# Patient Record
Sex: Female | Born: 1960
Health system: Southern US, Community
[De-identification: ages and names within clinical notes are randomized; demographics above are authoritative.]

## PROBLEM LIST (undated history)

## (undated) DIAGNOSIS — M069 Rheumatoid arthritis, unspecified: Secondary | ICD-10-CM

## (undated) DIAGNOSIS — L0292 Furuncle, unspecified: Secondary | ICD-10-CM

## (undated) DIAGNOSIS — N3946 Mixed incontinence: Secondary | ICD-10-CM

## (undated) DIAGNOSIS — B009 Herpesviral infection, unspecified: Secondary | ICD-10-CM

## (undated) DIAGNOSIS — N3281 Overactive bladder: Secondary | ICD-10-CM

## (undated) DIAGNOSIS — H938X9 Other specified disorders of ear, unspecified ear: Secondary | ICD-10-CM

## (undated) DIAGNOSIS — R7303 Prediabetes: Secondary | ICD-10-CM

## (undated) DIAGNOSIS — K649 Unspecified hemorrhoids: Secondary | ICD-10-CM

## (undated) DIAGNOSIS — E669 Obesity, unspecified: Secondary | ICD-10-CM

## (undated) DIAGNOSIS — G4730 Sleep apnea, unspecified: Secondary | ICD-10-CM

## (undated) DIAGNOSIS — Z9889 Other specified postprocedural states: Secondary | ICD-10-CM

## (undated) DIAGNOSIS — M5440 Lumbago with sciatica, unspecified side: Secondary | ICD-10-CM

## (undated) DIAGNOSIS — K219 Gastro-esophageal reflux disease without esophagitis: Secondary | ICD-10-CM

## (undated) DIAGNOSIS — M179 Osteoarthritis of knee, unspecified: Secondary | ICD-10-CM

## (undated) DIAGNOSIS — R112 Nausea with vomiting, unspecified: Secondary | ICD-10-CM

## (undated) DIAGNOSIS — M1710 Unilateral primary osteoarthritis, unspecified knee: Secondary | ICD-10-CM

## (undated) HISTORY — PX: SLEEVE GASTROPLASTY: SHX1101

## (undated) HISTORY — DX: Overactive bladder: N32.81

## (undated) HISTORY — DX: Rheumatoid arthritis, unspecified: M06.9

## (undated) HISTORY — DX: Herpesviral infection, unspecified: B00.9

## (undated) HISTORY — PX: FRACTURE SURGERY: SHX138

## (undated) HISTORY — DX: Furuncle, unspecified: L02.92

## (undated) HISTORY — DX: Osteoarthritis of knee, unspecified: M17.9

## (undated) HISTORY — DX: Lumbago with sciatica, unspecified side: M54.40

## (undated) HISTORY — DX: Unspecified hemorrhoids: K64.9

## (undated) HISTORY — DX: Mixed incontinence: N39.46

## (undated) HISTORY — DX: Other specified disorders of ear, unspecified ear: H93.8X9

## (undated) HISTORY — DX: Obesity, unspecified: E66.9

## (undated) HISTORY — DX: Unilateral primary osteoarthritis, unspecified knee: M17.10

---

## 2006-02-24 ENCOUNTER — Ambulatory Visit: Payer: Self-pay | Admitting: Surgery

## 2006-03-08 ENCOUNTER — Ambulatory Visit: Payer: Self-pay | Admitting: Gastroenterology

## 2006-03-16 ENCOUNTER — Emergency Department: Payer: Self-pay | Admitting: Emergency Medicine

## 2006-03-17 ENCOUNTER — Other Ambulatory Visit: Payer: Self-pay

## 2006-03-18 ENCOUNTER — Ambulatory Visit: Payer: Self-pay | Admitting: Emergency Medicine

## 2006-03-21 ENCOUNTER — Ambulatory Visit: Payer: Self-pay | Admitting: Emergency Medicine

## 2006-07-20 ENCOUNTER — Ambulatory Visit: Payer: Self-pay | Admitting: Gastroenterology

## 2007-03-13 ENCOUNTER — Emergency Department: Payer: Self-pay | Admitting: Emergency Medicine

## 2008-08-02 HISTORY — PX: TUMOR REMOVAL: SHX12

## 2008-11-15 ENCOUNTER — Ambulatory Visit: Payer: Self-pay | Admitting: Podiatry

## 2009-08-02 HISTORY — PX: CHOLECYSTECTOMY: SHX55

## 2010-08-17 LAB — HM COLONOSCOPY: HM Colonoscopy: NORMAL

## 2010-10-27 ENCOUNTER — Ambulatory Visit: Payer: Self-pay | Admitting: Family Medicine

## 2011-09-14 ENCOUNTER — Emergency Department: Payer: Self-pay | Admitting: Emergency Medicine

## 2011-09-17 ENCOUNTER — Ambulatory Visit: Payer: Self-pay | Admitting: Otolaryngology

## 2011-11-12 ENCOUNTER — Ambulatory Visit: Payer: Self-pay | Admitting: Family Medicine

## 2012-10-13 ENCOUNTER — Ambulatory Visit: Payer: Self-pay

## 2012-12-29 LAB — HM PAP SMEAR

## 2013-01-17 ENCOUNTER — Ambulatory Visit (INDEPENDENT_AMBULATORY_CARE_PROVIDER_SITE_OTHER): Payer: BC Managed Care – PPO | Admitting: General Surgery

## 2013-01-17 ENCOUNTER — Encounter: Payer: Self-pay | Admitting: General Surgery

## 2013-01-17 VITALS — BP 138/72 | HR 78 | Resp 14 | Ht 69.0 in | Wt 297.0 lb

## 2013-01-17 DIAGNOSIS — K625 Hemorrhage of anus and rectum: Secondary | ICD-10-CM

## 2013-01-17 LAB — HEMOCCULT GUIAC POC 1CARD (OFFICE)

## 2013-01-17 MED ORDER — HYDROCORTISONE ACE-PRAMOXINE 2.5-1 % RE CREA: TOPICAL_CREAM | Freq: Two times a day (BID) | RECTAL | Status: DC

## 2013-01-17 NOTE — Patient Instructions (Addendum)
Patient advised to use fiber supplement twice daily. She is also advised to use rectal cream twice daily. Patient to contact our office in 1 month with a report of how she is doing.

## 2013-01-17 NOTE — Progress Notes (Signed)
Patient ID: Erin Contreras, female   DOB: [birth year], 52 y.o.   MRN: [id number]  Chief Complaint  Patient presents with  . Hemorrhoids    HPI Erin Contreras is a 52 y.o. female who presents for an evaluation of hemorrhoids.  The patient states she has been dealing with this for approximately 1 year. She states it has gotten worse. Currently symptoms include nausea, diarrhea, constipation, and rectal bleeding. She states she notices blood in the stool but mostly when wiping.  The patient reports being evaluated at an urgent care center a year ago and provided with medicated suppositories at that time per did not use any particular treatment since then. She reports occasional hard stools. She may move her bowels 3-4 times per day, and occasionally will skip days. She is most likely to see bleeding with hard stools.  The patient reports no connection between her intermittent nausea and her rectal bleeding or pain with defecation. She did not experience any nausea prior to her cholecystectomy in 2007. The procedure was completed by Dr. Clydie Braun. Intra-operative cholangiograms were not performed. The patient showed transient elevation of her bilirubin and liver enzymes, but ultrasound and CT failed to show any evidence of ductal injury. An incidental finding was a less than 3 cm right adrenal mass. HPI  Past Medical History  Diagnosis Date  . Hemorrhoids   . Boils   . Ear mass   . Overactive bladder     Past Surgical History  Procedure Laterality Date  . Cholecystectomy  2011  . 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 No pertinent family history.  Social History History  Substance Use Topics  . Smoking status: Never Smoker   . Smokeless tobacco: Never Used  . Alcohol Use: No    No Known Allergies  Current Outpatient Prescriptions  Medication Sig Dispense Refill  . hydrocortisone-pramoxine (ANALPRAM-HC) 2.5-1 % rectal cream Place rectally 2 (two) times daily.  30 g  2  .  oxybutynin (DITROPAN) 5 MG tablet Take 5 mg by mouth daily.       No current facility-administered medications for this visit.    Review of Systems Review of Systems  Constitutional: Negative.   Respiratory: Negative.   Cardiovascular: Negative.   Gastrointestinal: Positive for nausea, diarrhea, constipation, blood in stool and anal bleeding.    Blood pressure 138/72, pulse 78, resp. rate 14, height 5\' 9"  (1.753 m), weight 297 lb (134.718 kg).  Physical Exam Physical Exam  Constitutional: She appears well-developed and well-nourished.  Cardiovascular: Normal rate, regular rhythm and normal heart sounds.   Pulmonary/Chest: Effort normal and breath sounds normal.  Abdominal: Soft. Normal appearance and bowel sounds are normal. There is no hepatosplenomegaly. No hernia.  Neurological: She is alert.  Skin: Skin is warm and dry.   External anal exam is unremarkable. Digital rectal exam was undertaken without difficulty or discomfort. Stool is Hemoccult negative. Anoscopy was used to visualize the lower rectum and anal rectal junction. No discernible abnormality of the rectal mucosa, internal or external hemorrhoids were identified. No fissures were identified. The patient was noted to have fairly hard stool on digital exam.  Data Reviewed Laboratory studies dated 12/29/2012 showed a normal electrolyte panel, creatinine 0.78, estimated GFR 100. Normal CBC, elevated cholesterol. Normal TSH.  The patient reports he has undergone a colonoscopy under the care of Dr.Iftikhar in the last year or 2. A release was obtained to review this study.  Assessment    Likely  anorectal trauma secondary to hard stools.    Plan    The patient has been asked to make use of a fiber supplement twice a day. Analpram-HC cream has been prescribed to be applied to the anal rectal junction twice a day. She's been asked to have a phone followup in one month with a progress report.  Her primary care provider will  be asked to review the records to see if any followup was done in regards to the right adrenal lesion identified at the time of her 2007 abdominal CT.       Earline Mayotte 01/18/2013, 5:07 PM

## 2013-01-18 ENCOUNTER — Encounter: Payer: Self-pay | Admitting: General Surgery

## 2013-02-06 ENCOUNTER — Ambulatory Visit: Payer: Self-pay | Admitting: Family Medicine

## 2013-02-06 LAB — HM MAMMOGRAPHY: HM Mammogram: NORMAL (ref 0–4)

## 2013-11-26 ENCOUNTER — Ambulatory Visit: Payer: BC Managed Care – PPO | Admitting: General Surgery

## 2013-12-18 ENCOUNTER — Encounter: Payer: Self-pay | Admitting: *Deleted

## 2014-04-25 ENCOUNTER — Ambulatory Visit: Payer: Self-pay | Admitting: Specialist

## 2014-05-09 ENCOUNTER — Ambulatory Visit: Payer: Self-pay | Admitting: Specialist

## 2014-05-10 ENCOUNTER — Ambulatory Visit: Payer: Self-pay | Admitting: Specialist

## 2014-05-10 LAB — CBC WITH DIFFERENTIAL/PLATELET
Basophil #: 0.1 10*3/uL (ref 0.0–0.1)
Basophil %: 1.5 %
Eosinophil #: 0.4 10*3/uL (ref 0.0–0.7)
Eosinophil %: 6.1 %
HCT: 41.1 % (ref 35.0–47.0)
HGB: 13.2 g/dL (ref 12.0–16.0)
Lymphocyte #: 2 10*3/uL (ref 1.0–3.6)
Lymphocyte %: 33.7 %
MCH: 28.1 pg (ref 26.0–34.0)
MCHC: 32.1 g/dL (ref 32.0–36.0)
MCV: 87 fL (ref 80–100)
Monocyte #: 0.3 x10 3/mm (ref 0.2–0.9)
Monocyte %: 5.9 %
Neutrophil #: 3.1 10*3/uL (ref 1.4–6.5)
Neutrophil %: 52.8 %
Platelet: 232 10*3/uL (ref 150–440)
RBC: 4.7 10*6/uL (ref 3.80–5.20)
RDW: 13 % (ref 11.5–14.5)
WBC: 5.9 10*3/uL (ref 3.6–11.0)

## 2014-05-10 LAB — COMPREHENSIVE METABOLIC PANEL
ALK PHOS: 84 U/L
ANION GAP: 7 (ref 7–16)
AST: 25 U/L (ref 15–37)
Albumin: 3.3 g/dL — ABNORMAL LOW (ref 3.4–5.0)
BILIRUBIN TOTAL: 0.4 mg/dL (ref 0.2–1.0)
BUN: 16 mg/dL (ref 7–18)
CALCIUM: 8.5 mg/dL (ref 8.5–10.1)
CHLORIDE: 106 mmol/L (ref 98–107)
CREATININE: 0.62 mg/dL (ref 0.60–1.30)
Co2: 28 mmol/L (ref 21–32)
EGFR (African American): >60
Glucose: 91 mg/dL (ref 65–99)
OSMOLALITY: 282 (ref 275–301)
POTASSIUM: 4 mmol/L (ref 3.5–5.1)
SGPT (ALT): 29 U/L
SODIUM: 141 mmol/L (ref 136–145)
TOTAL PROTEIN: 7.5 g/dL (ref 6.4–8.2)

## 2014-05-10 LAB — IRON AND TIBC
Iron Bind.Cap.(Total): 377 ug/dL (ref 250–450)
Iron Saturation: 28 %
Iron: 107 ug/dL (ref 50–170)
Unbound Iron-Bind.Cap.: 270 ug/dL

## 2014-05-10 LAB — PROTIME-INR
INR: 0.9
PROTHROMBIN TIME: 12.3 s (ref 11.5–14.7)

## 2014-05-10 LAB — FOLATE: Folic Acid: 28.3 ng/mL (ref 3.1–100.0)

## 2014-05-10 LAB — LIPASE, BLOOD: Lipase: 106 U/L (ref 73–393)

## 2014-05-10 LAB — AMYLASE: AMYLASE: 40 U/L (ref 25–115)

## 2014-05-10 LAB — APTT: Activated PTT: 24.3 secs (ref 23.6–35.9)

## 2014-05-10 LAB — MAGNESIUM: Magnesium: 1.9 mg/dL

## 2014-05-10 LAB — FERRITIN: Ferritin (ARMC): 117 ng/mL (ref 8–388)

## 2014-05-10 LAB — HEMOGLOBIN A1C: HEMOGLOBIN A1C: 6 % (ref 4.2–6.3)

## 2014-05-10 LAB — TSH: THYROID STIMULATING HORM: 0.756 u[IU]/mL

## 2014-05-10 LAB — PHOSPHORUS: PHOSPHORUS: 2.8 mg/dL (ref 2.5–4.9)

## 2014-05-10 LAB — BILIRUBIN, DIRECT

## 2014-05-24 DIAGNOSIS — K2100 Gastro-esophageal reflux disease with esophagitis, without bleeding: Secondary | ICD-10-CM | POA: Insufficient documentation

## 2014-06-04 ENCOUNTER — Ambulatory Visit: Payer: Self-pay | Admitting: Specialist

## 2014-06-10 ENCOUNTER — Ambulatory Visit: Payer: Self-pay | Admitting: Gastroenterology

## 2014-07-01 ENCOUNTER — Ambulatory Visit: Payer: Self-pay | Admitting: Specialist

## 2014-08-05 ENCOUNTER — Ambulatory Visit: Payer: Self-pay | Admitting: Specialist

## 2014-08-26 ENCOUNTER — Ambulatory Visit: Payer: Self-pay | Admitting: Specialist

## 2014-08-26 LAB — BASIC METABOLIC PANEL
Anion Gap: 5 — ABNORMAL LOW (ref 7–16)
BUN: 15 mg/dL (ref 7–18)
CALCIUM: 9.5 mg/dL (ref 8.5–10.1)
CO2: 28 mmol/L (ref 21–32)
Chloride: 106 mmol/L (ref 98–107)
Creatinine: 0.76 mg/dL (ref 0.60–1.30)
EGFR (African American): >60
Glucose: 94 mg/dL (ref 65–99)
OSMOLALITY: 278 (ref 275–301)
POTASSIUM: 4.1 mmol/L (ref 3.5–5.1)
Sodium: 139 mmol/L (ref 136–145)

## 2014-09-03 ENCOUNTER — Inpatient Hospital Stay: Payer: Self-pay | Admitting: Specialist

## 2014-09-04 LAB — BASIC METABOLIC PANEL
Anion Gap: 6 — ABNORMAL LOW (ref 7–16)
BUN: 7 mg/dL (ref 7–18)
Calcium, Total: 8.8 mg/dL (ref 8.5–10.1)
Chloride: 105 mmol/L (ref 98–107)
Co2: 27 mmol/L (ref 21–32)
Creatinine: 0.69 mg/dL (ref 0.60–1.30)
EGFR (African American): >60
EGFR (Non-African Amer.): >60
Glucose: 109 mg/dL — ABNORMAL HIGH (ref 65–99)
Osmolality: 274 (ref 275–301)
Potassium: 3.9 mmol/L (ref 3.5–5.1)
Sodium: 138 mmol/L (ref 136–145)

## 2014-09-04 LAB — CBC WITH DIFFERENTIAL/PLATELET
BASOS ABS: 0 10*3/uL (ref 0.0–0.1)
Basophil %: 0 %
EOS ABS: 0 10*3/uL (ref 0.0–0.7)
Eosinophil %: 0 %
HCT: 37.8 % (ref 35.0–47.0)
HGB: 12.3 g/dL (ref 12.0–16.0)
LYMPHS ABS: 0.6 10*3/uL — AB (ref 1.0–3.6)
Lymphocyte %: 7.4 %
MCH: 27.9 pg (ref 26.0–34.0)
MCHC: 32.5 g/dL (ref 32.0–36.0)
MCV: 86 fL (ref 80–100)
MONO ABS: 0.3 x10 3/mm (ref 0.2–0.9)
Monocyte %: 3.2 %
NEUTROS PCT: 89.4 %
Neutrophil #: 7.2 10*3/uL — ABNORMAL HIGH (ref 1.4–6.5)
PLATELETS: 201 10*3/uL (ref 150–440)
RBC: 4.39 10*6/uL (ref 3.80–5.20)
RDW: 13.4 % (ref 11.5–14.5)
WBC: 8 10*3/uL (ref 3.6–11.0)

## 2014-09-04 LAB — MAGNESIUM: Magnesium: 2 mg/dL

## 2014-09-04 LAB — PHOSPHORUS: Phosphorus: 3.7 mg/dL (ref 2.5–4.9)

## 2014-09-04 LAB — ALBUMIN: Albumin: 3 g/dL — ABNORMAL LOW (ref 3.4–5.0)

## 2014-09-27 ENCOUNTER — Ambulatory Visit: Payer: Self-pay

## 2014-11-25 LAB — SURGICAL PATHOLOGY

## 2014-12-01 NOTE — Op Note (Signed)
PATIENT NAME:  Erin Contreras, Erin Contreras MR#:  [id number] DATE OF BIRTH:  12-Mar-1961  DATE OF PROCEDURE:  09/03/2014  PREOPERATIVE DIAGNOSES: Morbid obesity.   POSTOPERATIVE DIAGNOSIS: Morbid obesity plus hiatal hernia.  PROCEDURE: Laparoscopic sleeve gastrectomy plus hiatal hernia repair.   SURGEON: Kreg Shropshire, MD   ASSISTANT: Valaria Good, PA-C   CLINICAL INDICATIONS: See H and P.   SPECIMENS: Portion of stomach.   BLOOD LOSS: None.  COMPLICATIONS: None.   DETAILS OF PROCEDURE: The patient was taken to the operating room and placed on the operating table in the supine position. Appropriate monitors and supplemental oxygen delivered and broad-spectrum antibiotics were administered. The patient was placed under general anesthesia without incident. Timeout was performed. Sequential stockings were placed. The abdomen was prepped and draped in the usual sterile fashion. Timeout was performed. The abdomen was then accessed using a 5 mm optical trocar in the left upper quadrant. Pneumoperitoneum was established. Multiple other trocars were placed in the normal positions in preparation for sleeve gastrectomy. Liver retractor was placed. A small hiatal hernia was present. This was dissected free from the surrounding tissues and the posterior crura were reapproximated using interrupted Surgidac suture. The greater curvature of the stomach was mobilized from several centimeters from the pylorus all the way to the fundus. The fundus was fully mobilized medially with posterior pancreatic attachments taken down. The short gastrics obviously were divided as well, all using Harmonic scalpel. Once this was fully freed up, a 36 French bougie perforated type was inserted down to the antrum and the antrum was bisected using an Echelon green load reinforced stapler starting approximately 5 cm from the pylorus. Multiple other gold loads with Seamguard were then fired using the opposite trocar on the patient's left side  creating a nice sleeve. The excess stomach was removed through a 50 mm port and sent for pathologic evaluation. The staple line was intact and showed no evidence of bleeding or leak. The trocars were removed without incident as well as the liver retractor. The wound was closed using 4-0 Vicryl and Dermabond. The patient tolerated the procedure well and arrived to covered stable condition. The patient will be admitted for further care.    ____________________________ Kreg Shropshire, MD jb:TT D: 09/03/2014 16:14:32 ET T: 09/03/2014 20:12:33 ET JOB#: [id number]  cc: Kreg Shropshire, MD, <Dictator> Bonner Puna MD ELECTRONICALLY SIGNED 09/04/2014 10:27

## 2015-09-22 ENCOUNTER — Other Ambulatory Visit: Payer: Self-pay | Admitting: Unknown Physician Specialty

## 2015-12-02 ENCOUNTER — Encounter: Payer: Self-pay | Admitting: Unknown Physician Specialty

## 2015-12-02 ENCOUNTER — Ambulatory Visit (INDEPENDENT_AMBULATORY_CARE_PROVIDER_SITE_OTHER): Payer: BLUE CROSS/BLUE SHIELD | Admitting: Unknown Physician Specialty

## 2015-12-02 VITALS — BP 114/76 | HR 61 | Temp 98.0°F | Ht 67.5 in | Wt 229.4 lb

## 2015-12-02 DIAGNOSIS — M069 Rheumatoid arthritis, unspecified: Secondary | ICD-10-CM | POA: Insufficient documentation

## 2015-12-02 DIAGNOSIS — L84 Corns and callosities: Secondary | ICD-10-CM | POA: Diagnosis not present

## 2015-12-02 DIAGNOSIS — G8929 Other chronic pain: Secondary | ICD-10-CM | POA: Insufficient documentation

## 2015-12-02 DIAGNOSIS — M545 Low back pain, unspecified: Secondary | ICD-10-CM | POA: Insufficient documentation

## 2015-12-02 DIAGNOSIS — N3946 Mixed incontinence: Secondary | ICD-10-CM | POA: Insufficient documentation

## 2015-12-02 DIAGNOSIS — E669 Obesity, unspecified: Secondary | ICD-10-CM | POA: Insufficient documentation

## 2015-12-02 DIAGNOSIS — M25561 Pain in right knee: Secondary | ICD-10-CM | POA: Diagnosis not present

## 2015-12-02 DIAGNOSIS — M5440 Lumbago with sciatica, unspecified side: Secondary | ICD-10-CM

## 2015-12-02 DIAGNOSIS — M1710 Unilateral primary osteoarthritis, unspecified knee: Secondary | ICD-10-CM | POA: Insufficient documentation

## 2015-12-02 DIAGNOSIS — Z9884 Bariatric surgery status: Secondary | ICD-10-CM | POA: Diagnosis not present

## 2015-12-02 DIAGNOSIS — G4733 Obstructive sleep apnea (adult) (pediatric): Secondary | ICD-10-CM | POA: Diagnosis not present

## 2015-12-02 DIAGNOSIS — M179 Osteoarthritis of knee, unspecified: Secondary | ICD-10-CM | POA: Insufficient documentation

## 2015-12-02 DIAGNOSIS — M0579 Rheumatoid arthritis with rheumatoid factor of multiple sites without organ or systems involvement: Secondary | ICD-10-CM | POA: Insufficient documentation

## 2015-12-02 DIAGNOSIS — K912 Postsurgical malabsorption, not elsewhere classified: Secondary | ICD-10-CM | POA: Diagnosis not present

## 2015-12-02 DIAGNOSIS — I10 Essential (primary) hypertension: Secondary | ICD-10-CM | POA: Diagnosis not present

## 2015-12-02 MED ORDER — MELOXICAM 15 MG PO TABS: 15.0000 mg | ORAL_TABLET | Freq: Every day | ORAL | Status: DC

## 2015-12-02 NOTE — Assessment & Plan Note (Addendum)
Suspect strain with tenderness of right collateral ligament with some OA.  Get new shoes.  Rx for Meloxicam.  Consider x-ray

## 2015-12-02 NOTE — Assessment & Plan Note (Signed)
Keep pared with [employer]

## 2015-12-02 NOTE — Progress Notes (Signed)
BP 114/76 mmHg  Pulse 61  Temp(Src) 98 F (36.7 C)  Ht 5' 7.5" (1.715 m)  Wt 229 lb 6.4 oz (104.055 kg)  BMI 35.38 kg/m2  SpO2 96%   Subjective:    Patient ID: Erin Contreras, female    DOB: Sep 22, 1960, 55 y.o.   MRN: [id number]  HPI: Erin Contreras is a 55 y.o. female  Chief Complaint  Patient presents with  . Knee Pain    pt states she has been having pain in her right knee for a few months now  . Foot Pain    pt states she has also been having pain in right foot when she is on it   Knee pain For 2 months.  Hurts worse at the end of the day after working.  When not working, it feels OK.  Difficult to get it comfortable.  Aleve only helps slightly  Foot pain One particular spot at the ball of her foot hurts for about a month.  States it is a spot about the sized of a quarter.  States pain shoots. This also doesn't hurt when not working.    Relevant past medical, surgical, family and social history reviewed and updated as indicated. Interim medical history since our last visit reviewed. Allergies and medications reviewed and updated.  Review of Systems  Constitutional: Negative.   HENT: Negative.   Eyes: Negative.   Respiratory: Negative.   Cardiovascular: Negative.   Gastrointestinal: Negative.   Endocrine: Negative.   Genitourinary: Negative.   Skin: Negative.   Allergic/Immunologic: Negative.   Neurological: Negative.   Hematological: Negative.   Psychiatric/Behavioral: Negative.     Per HPI unless specifically indicated above     Objective:    BP 114/76 mmHg  Pulse 61  Temp(Src) 98 F (36.7 C)  Ht 5' 7.5" (1.715 m)  Wt 229 lb 6.4 oz (104.055 kg)  BMI 35.38 kg/m2  SpO2 96%  Wt Readings from Last 3 Encounters:  12/02/15 229 lb 6.4 oz (104.055 kg)  10/04/14 279 lb (126.554 kg)  01/17/13 297 lb (134.718 kg)    Physical Exam  Constitutional: She is oriented to person, place, and time. She appears well-developed and well-nourished. No distress.   HENT:  Head: Normocephalic and atraumatic.  Eyes: Conjunctivae and lids are normal. Right eye exhibits no discharge. Left eye exhibits no discharge. No scleral icterus.  Cardiovascular: Normal rate.   Pulmonary/Chest: Effort normal.  Abdominal: Normal appearance. There is no splenomegaly or hepatomegaly.  Musculoskeletal: Normal range of motion.       Right knee: She exhibits normal range of motion, no swelling and no effusion. Tenderness found. LCL tenderness noted.  Neurological: She is alert and oriented to person, place, and time.  Skin: Skin is intact. No rash noted. No pallor.  Callous bottom of right foot which I pared.  Pt reports much relief after this procedure.    Psychiatric: She has a normal mood and affect. Her behavior is normal. Judgment and thought content normal.       Assessment & Plan:   Problem List Items Addressed This Visit      Unprioritized   Knee pain, right - Primary    Suspect strain with tenderness of right collateral ligament with some OA.  Get new shoes.  Rx for Meloxicam.  Consider x-ray      Pre-ulcerative corn or callous    Keep pared with [employer]           Follow  up plan: Return in about 3 weeks (around 12/23/2015).

## 2015-12-11 DIAGNOSIS — G4733 Obstructive sleep apnea (adult) (pediatric): Secondary | ICD-10-CM | POA: Diagnosis not present

## 2015-12-23 ENCOUNTER — Encounter: Payer: Self-pay | Admitting: Unknown Physician Specialty

## 2015-12-23 ENCOUNTER — Ambulatory Visit (INDEPENDENT_AMBULATORY_CARE_PROVIDER_SITE_OTHER): Payer: BLUE CROSS/BLUE SHIELD | Admitting: Unknown Physician Specialty

## 2015-12-23 VITALS — BP 109/75 | HR 63 | Temp 97.7°F | Ht 67.7 in | Wt 233.2 lb

## 2015-12-23 DIAGNOSIS — G5701 Lesion of sciatic nerve, right lower limb: Secondary | ICD-10-CM | POA: Diagnosis not present

## 2015-12-23 DIAGNOSIS — M25561 Pain in right knee: Secondary | ICD-10-CM | POA: Diagnosis not present

## 2015-12-23 MED ORDER — MELOXICAM 15 MG PO TABS: 15.0000 mg | ORAL_TABLET | Freq: Every day | ORAL | Status: DC

## 2015-12-23 MED ORDER — TRAMADOL HCL 50 MG PO TABS: 50.0000 mg | ORAL_TABLET | Freq: Every evening | ORAL | Status: DC | PRN

## 2015-12-23 NOTE — Progress Notes (Signed)
   BP 109/75 mmHg  Pulse 63  Temp(Src) 97.7 F (36.5 C)  Ht 5' 7.7" (1.72 m)  Wt 233 lb 3.2 oz (105.779 kg)  BMI 35.76 kg/m2  SpO2 99%   Subjective:    Patient ID: Erin Contreras, female    DOB: [birth year], 55 y.o.   MRN: [id number]  HPI: Erin Contreras is a 55 y.o. female  Chief Complaint  Patient presents with  . Follow-up    3 week f/u for knee pain   "My knee still hurts me."  We gave her Meloxicam with some relief but taking it BID.  She is having a lot of problems at work.  States her whole right side hurts with pain that starts in her buttock and radiates down "like a toothache."  She often has to hold onto something when she gets up.  She is working 12 hour shifts on her feet and concrete floors.    Relevant past medical, surgical, family and social history reviewed and updated as indicated. Interim medical history since our last visit reviewed. Allergies and medications reviewed and updated.  Review of Systems  Per HPI unless specifically indicated above     Objective:    BP 109/75 mmHg  Pulse 63  Temp(Src) 97.7 F (36.5 C)  Ht 5' 7.7" (1.72 m)  Wt 233 lb 3.2 oz (105.779 kg)  BMI 35.76 kg/m2  SpO2 99%  Wt Readings from Last 3 Encounters:  12/23/15 233 lb 3.2 oz (105.779 kg)  12/02/15 229 lb 6.4 oz (104.055 kg)  10/04/14 279 lb (126.554 kg)    Physical Exam  Constitutional: She is oriented to person, place, and time. She appears well-developed and well-nourished. No distress.  HENT:  Head: Normocephalic and atraumatic.  Eyes: Conjunctivae and lids are normal. Right eye exhibits no discharge. Left eye exhibits no discharge. No scleral icterus.  Cardiovascular: Normal rate.   Pulmonary/Chest: Effort normal.  Abdominal: Normal appearance. There is no splenomegaly or hepatomegaly.  Musculoskeletal: Normal range of motion.       Right knee: She exhibits normal range of motion, no swelling, no erythema, normal alignment, no LCL laxity, normal meniscus and no  MCL laxity. Tenderness found. Lateral joint line and LCL tenderness noted. No MCL and no patellar tendon tenderness noted.  Crepitis noted right knee  Tender left priformis  Neurological: She is alert and oriented to person, place, and time.  Skin: Skin is intact. No rash noted. No pallor.  Psychiatric: She has a normal mood and affect. Her behavior is normal. Judgment and thought content normal.       Assessment & Plan:   Problem List Items Addressed This Visit      Unprioritized   Knee pain, right    Refer to Orthopedics for further management.  Will not get x-ray as they will do x-ray there      Relevant Orders   Ambulatory referral to Orthopedic Surgery   Piriformis syndrome of right side - Primary    Exercises demonstrated  Cautioned to take meloxicam only once a day.  Will add Tramadol for evening use      Relevant Orders   Ambulatory referral to Orthopedic Surgery       Follow up plan: Return if symptoms worsen or fail to improve.

## 2015-12-23 NOTE — Assessment & Plan Note (Addendum)
Exercises demonstrated  Cautioned to take meloxicam only once a day.  Will add Tramadol for evening use

## 2015-12-23 NOTE — Assessment & Plan Note (Signed)
Refer to Orthopedics for further management.  Will not get x-ray as they will do x-ray there

## 2016-01-01 ENCOUNTER — Telehealth: Payer: Self-pay

## 2016-01-01 NOTE — Telephone Encounter (Signed)
Called [facility] and spoke with [name] regarding patient's referral.   Patient cannot be scheduled at this time because patient has a large outstanding balance.  Patient needs to call [name] at [phone] before patient can be scheduled.   Tried calling patient to notify her, she did not answer. Left message for her to return my phone call.

## 2016-01-23 DIAGNOSIS — M71331 Other bursal cyst, right wrist: Secondary | ICD-10-CM | POA: Diagnosis not present

## 2016-01-23 DIAGNOSIS — M79642 Pain in left hand: Secondary | ICD-10-CM | POA: Diagnosis not present

## 2016-01-23 DIAGNOSIS — M79641 Pain in right hand: Secondary | ICD-10-CM | POA: Diagnosis not present

## 2016-01-23 DIAGNOSIS — M0589 Other rheumatoid arthritis with rheumatoid factor of multiple sites: Secondary | ICD-10-CM | POA: Diagnosis not present

## 2016-02-22 DIAGNOSIS — H1013 Acute atopic conjunctivitis, bilateral: Secondary | ICD-10-CM | POA: Diagnosis not present

## 2016-03-02 DIAGNOSIS — H1045 Other chronic allergic conjunctivitis: Secondary | ICD-10-CM | POA: Diagnosis not present

## 2016-03-15 DIAGNOSIS — G4733 Obstructive sleep apnea (adult) (pediatric): Secondary | ICD-10-CM | POA: Diagnosis not present

## 2016-05-03 ENCOUNTER — Ambulatory Visit (INDEPENDENT_AMBULATORY_CARE_PROVIDER_SITE_OTHER): Payer: BLUE CROSS/BLUE SHIELD | Admitting: Family Medicine

## 2016-05-03 ENCOUNTER — Encounter: Payer: Self-pay | Admitting: Family Medicine

## 2016-05-03 VITALS — BP 122/85 | HR 60 | Temp 98.5°F | Wt 234.0 lb

## 2016-05-03 DIAGNOSIS — S39012A Strain of muscle, fascia and tendon of lower back, initial encounter: Secondary | ICD-10-CM | POA: Diagnosis not present

## 2016-05-03 DIAGNOSIS — H04123 Dry eye syndrome of bilateral lacrimal glands: Secondary | ICD-10-CM

## 2016-05-03 DIAGNOSIS — R350 Frequency of micturition: Secondary | ICD-10-CM

## 2016-05-03 LAB — UA/M W/RFLX CULTURE, ROUTINE
BILIRUBIN UA: NEGATIVE
Glucose, UA: NEGATIVE
Ketones, UA: NEGATIVE
Leukocytes, UA: NEGATIVE
NITRITE UA: NEGATIVE
PH UA: 8 — AB (ref 5.0–7.5)
PROTEIN UA: NEGATIVE
RBC, UA: NEGATIVE
Specific Gravity, UA: 1.02 (ref 1.005–1.030)
UUROB: 0.2 mg/dL (ref 0.2–1.0)

## 2016-05-03 MED ORDER — NAPHAZOLINE-PHENIRAMINE 0.025-0.3 % OP SOLN: 1.0000 [drp] | Freq: Four times a day (QID) | OPHTHALMIC | 0 refills | Status: DC | PRN

## 2016-05-03 MED ORDER — CYCLOBENZAPRINE HCL 10 MG PO TABS
10.0000 mg | ORAL_TABLET | Freq: Three times a day (TID) | ORAL | 0 refills | Status: DC | PRN
Start: 2016-05-03 — End: 2016-09-13

## 2016-05-03 MED ORDER — NAPROXEN 500 MG PO TABS: 500.0000 mg | ORAL_TABLET | Freq: Two times a day (BID) | ORAL | 0 refills | Status: DC

## 2016-05-03 NOTE — Patient Instructions (Signed)
Follow up as needed

## 2016-05-03 NOTE — Progress Notes (Signed)
BP 122/85   Pulse 60   Temp 98.5 F (36.9 C)   Wt 234 lb (106.1 kg)   SpO2 100%   BMI 35.90 kg/m    Subjective:    Patient ID: Erin Contreras, female    DOB: [birth year], 55 y.o.   MRN: [id number]  HPI: Erin Contreras is a 55 y.o. female  Chief Complaint  Patient presents with  . Eye Problem    x 6-8 weeks, bilateral eyes, but left is worse. Some blurry vision, feels like they are glued together in the morning, sometimes burns. No itching, redness, or drainage.   Patient presents with 6 week history of b/l eye crusting and intermittent fuzzy vision in the mornings. States when she first wakes up, her eyes feel glued shut and she has to pry her eyelids open. Reports that it takes a few minutes for her vision to clear up and to see clearly, then it seems fine the rest of the day. Has not had any itching or redness. Denies HAs, N/V. Has not tried anything OTC at this time.   Also c/o left lower back pain that is localized and has been present for about 4 days. Not aware of any events that could have caused the pain. Does c/o of some urinary frequency starting around the same time. Has not tried taking anything for the pain.   Relevant past medical, surgical, family and social history reviewed and updated as indicated. Interim medical history since our last visit reviewed. Allergies and medications reviewed and updated.  Review of Systems  Constitutional: Negative.   HENT: Negative.   Eyes: Positive for discharge.  Respiratory: Negative.   Cardiovascular: Negative.   Gastrointestinal: Negative.   Genitourinary: Positive for frequency.  Musculoskeletal: Positive for back pain.  Skin: Negative.   Neurological: Negative.   Psychiatric/Behavioral: Negative.     Per HPI unless specifically indicated above     Objective:    BP 122/85   Pulse 60   Temp 98.5 F (36.9 C)   Wt 234 lb (106.1 kg)   SpO2 100%   BMI 35.90 kg/m   Wt Readings from Last 3 Encounters:  05/03/16  234 lb (106.1 kg)  12/23/15 233 lb 3.2 oz (105.8 kg)  12/02/15 229 lb 6.4 oz (104.1 kg)    Physical Exam  Constitutional: She is oriented to person, place, and time. She appears well-developed and well-nourished. No distress.  HENT:  Head: Atraumatic.  Right Ear: External ear normal.  Left Ear: External ear normal.  Nose: Nose normal.  Mouth/Throat: Oropharynx is clear and moist. No oropharyngeal exudate.  Eyes: Conjunctivae and EOM are normal. Pupils are equal, round, and reactive to light. Right eye exhibits no discharge. Left eye exhibits no discharge. No scleral icterus.  Neck: Normal range of motion. Neck supple.  Cardiovascular: Normal rate, regular rhythm and normal heart sounds.   Pulmonary/Chest: Effort normal and breath sounds normal. No respiratory distress.  Musculoskeletal: Normal range of motion. She exhibits tenderness (TTP in left lumbar paraspinal muscles).  Lymphadenopathy:    She has no cervical adenopathy.  Neurological: She is alert and oriented to person, place, and time. No cranial nerve deficit.  Skin: Skin is warm and dry.  Psychiatric: She has a normal mood and affect. Her behavior is normal.  Nursing note and vitals reviewed.       Assessment & Plan:   Problem List Items Addressed This Visit    None    Visit Diagnoses  Dry eyes, bilateral    -  Primary   Suspect could be allergic component, allergy drops and visine recommended. Will follow up with ophthalmology if no improvement in a week or so.    Urinary frequency       Negative U/A, continue good hydration habits and follow up if dysuria, hematuria, or worsening frequency/urgency   Relevant Orders   UA/M w/rflx Culture, Routine (Completed)   Lumbar strain, initial encounter       Flexeril and naproxen sent, precautions discussed. Gentle stretches, massage, warm epsom salt soaks recommended       Follow up plan: Return if symptoms worsen or fail to improve.

## 2016-06-08 DIAGNOSIS — K912 Postsurgical malabsorption, not elsewhere classified: Secondary | ICD-10-CM | POA: Diagnosis not present

## 2016-06-08 DIAGNOSIS — Z9884 Bariatric surgery status: Secondary | ICD-10-CM | POA: Diagnosis not present

## 2016-06-10 DIAGNOSIS — Z713 Dietary counseling and surveillance: Secondary | ICD-10-CM | POA: Diagnosis not present

## 2016-09-13 ENCOUNTER — Encounter: Payer: Self-pay | Admitting: Unknown Physician Specialty

## 2016-09-13 ENCOUNTER — Ambulatory Visit (INDEPENDENT_AMBULATORY_CARE_PROVIDER_SITE_OTHER): Payer: BLUE CROSS/BLUE SHIELD | Admitting: Unknown Physician Specialty

## 2016-09-13 DIAGNOSIS — M1711 Unilateral primary osteoarthritis, right knee: Secondary | ICD-10-CM | POA: Diagnosis not present

## 2016-09-13 NOTE — Progress Notes (Signed)
   BP 121/75 (BP Location: Left Arm, Patient Position: Sitting, Cuff Size: Large)   Pulse 76   Temp 98.2 F (36.8 C)   Wt 246 lb (111.6 kg)   SpO2 98%   BMI 37.74 kg/m    Subjective:    Patient ID: Erin Contreras, female    DOB: 1961/02/13, 56 y.o.   MRN: [id number]  HPI: JAZZY HOLDERBAUM is a 56 y.o. female  Chief Complaint  Patient presents with  . Pain    pt states she has had pain in her right leg and left ankle off and on for a few months. Patient describes pain as shooting pains   Pt with complaints of primarily right knee pain.  Improves after walking for a period of time.  Hurts after work after being up on it all day "one I begin to relax."  On weekends only hurts when getting up.    Relevant past medical, surgical, family and social history reviewed and updated as indicated. Interim medical history since our last visit reviewed. Allergies and medications reviewed and updated.  Review of Systems  Per HPI unless specifically indicated above     Objective:    BP 121/75 (BP Location: Left Arm, Patient Position: Sitting, Cuff Size: Large)   Pulse 76   Temp 98.2 F (36.8 C)   Wt 246 lb (111.6 kg)   SpO2 98%   BMI 37.74 kg/m   Wt Readings from Last 3 Encounters:  09/13/16 246 lb (111.6 kg)  05/03/16 234 lb (106.1 kg)  12/23/15 233 lb 3.2 oz (105.8 kg)    Physical Exam  Constitutional: She is oriented to person, place, and time. She appears well-developed and well-nourished. No distress.  HENT:  Head: Normocephalic and atraumatic.  Eyes: Conjunctivae and lids are normal. Right eye exhibits no discharge. Left eye exhibits no discharge. No scleral icterus.  Cardiovascular: Normal rate.   Pulmonary/Chest: Effort normal.  Abdominal: Normal appearance. There is no splenomegaly or hepatomegaly.  Musculoskeletal:       Right knee: She exhibits decreased range of motion. She exhibits no swelling and no effusion.  Neurological: She is alert and oriented to person,  place, and time.  Skin: Skin is intact. No rash noted. No pallor.  Psychiatric: She has a normal mood and affect. Her behavior is normal. Judgment and thought content normal.   STEROID INJECTION  Procedure: Knee Intraarticular Steroid Injection   Description: After verbal consent and patient ed on Area prepped and draped using  semi-sterile technique. Using a anterior  approach, a mixture of 4 cc of  1% Marcaine & 1 cc of Kenalog 40 was injected into knee joint.  A bandage was then placed over the injection site. Complications:  none Post Procedure Instructions: To the ER if any symptoms of erythema or swelling.         Assessment & Plan:   Problem List Items Addressed This Visit      Unprioritized   OA (osteoarthritis) of knee    Pt with increasing problems right knee      Relevant Orders   Ambulatory referral to Orthopedic Surgery       Follow up plan: Return if symptoms worsen or fail to improve.

## 2016-09-13 NOTE — Assessment & Plan Note (Signed)
Pt with increasing problems right knee

## 2016-09-15 DIAGNOSIS — M791 Myalgia: Secondary | ICD-10-CM | POA: Diagnosis not present

## 2016-09-15 DIAGNOSIS — M71331 Other bursal cyst, right wrist: Secondary | ICD-10-CM | POA: Diagnosis not present

## 2016-09-15 DIAGNOSIS — M0589 Other rheumatoid arthritis with rheumatoid factor of multiple sites: Secondary | ICD-10-CM | POA: Diagnosis not present

## 2016-09-15 DIAGNOSIS — M79642 Pain in left hand: Secondary | ICD-10-CM | POA: Diagnosis not present

## 2016-09-15 DIAGNOSIS — M79641 Pain in right hand: Secondary | ICD-10-CM | POA: Diagnosis not present

## 2016-10-04 ENCOUNTER — Other Ambulatory Visit: Payer: Self-pay | Admitting: Unknown Physician Specialty

## 2016-12-02 DIAGNOSIS — Z1231 Encounter for screening mammogram for malignant neoplasm of breast: Secondary | ICD-10-CM | POA: Diagnosis not present

## 2017-01-13 ENCOUNTER — Other Ambulatory Visit: Payer: Self-pay | Admitting: Unknown Physician Specialty

## 2017-01-13 NOTE — Telephone Encounter (Signed)
Patient needs a refill on her acylovir (Zovirax) taken as 1 twice a day as needed.  [facility]  Thanks

## 2017-01-13 NOTE — Telephone Encounter (Signed)
Routing to provider  

## 2017-01-14 MED ORDER — ACYCLOVIR 400 MG PO TABS: 400.0000 mg | ORAL_TABLET | Freq: Two times a day (BID) | ORAL | 1 refills | Status: DC | PRN

## 2017-03-13 DIAGNOSIS — Y998 Other external cause status: Secondary | ICD-10-CM | POA: Insufficient documentation

## 2017-03-13 DIAGNOSIS — W010XXA Fall on same level from slipping, tripping and stumbling without subsequent striking against object, initial encounter: Secondary | ICD-10-CM | POA: Insufficient documentation

## 2017-03-13 DIAGNOSIS — Y939 Activity, unspecified: Secondary | ICD-10-CM | POA: Diagnosis not present

## 2017-03-13 DIAGNOSIS — Z79899 Other long term (current) drug therapy: Secondary | ICD-10-CM | POA: Diagnosis not present

## 2017-03-13 DIAGNOSIS — S93401A Sprain of unspecified ligament of right ankle, initial encounter: Secondary | ICD-10-CM | POA: Insufficient documentation

## 2017-03-13 DIAGNOSIS — M25571 Pain in right ankle and joints of right foot: Secondary | ICD-10-CM | POA: Diagnosis not present

## 2017-03-13 DIAGNOSIS — M25561 Pain in right knee: Secondary | ICD-10-CM | POA: Diagnosis not present

## 2017-03-13 DIAGNOSIS — Y929 Unspecified place or not applicable: Secondary | ICD-10-CM | POA: Insufficient documentation

## 2017-03-13 NOTE — ED Triage Notes (Signed)
Patient trying to get from the car to the house tonight and presents with right knee and right ankle pain. Swelling noted to both areas without deformity.

## 2017-03-14 ENCOUNTER — Emergency Department
Admission: EM | Admit: 2017-03-14 | Discharge: 2017-03-14 | Disposition: A | Discharge status: Home / Self Care | Payer: BLUE CROSS/BLUE SHIELD | Attending: Emergency Medicine | Admitting: Emergency Medicine

## 2017-03-14 ENCOUNTER — Emergency Department: Payer: BLUE CROSS/BLUE SHIELD

## 2017-03-14 DIAGNOSIS — M25561 Pain in right knee: Secondary | ICD-10-CM

## 2017-03-14 DIAGNOSIS — M25571 Pain in right ankle and joints of right foot: Secondary | ICD-10-CM | POA: Diagnosis not present

## 2017-03-14 DIAGNOSIS — W19XXXA Unspecified fall, initial encounter: Secondary | ICD-10-CM

## 2017-03-14 DIAGNOSIS — S93401A Sprain of unspecified ligament of right ankle, initial encounter: Secondary | ICD-10-CM

## 2017-03-14 MED ORDER — HYDROCODONE-ACETAMINOPHEN 5-325 MG PO TABS
1.0000 | ORAL_TABLET | Freq: Once | ORAL | Status: AC
  Administered 2017-03-14: 1 via ORAL
  Filled 2017-03-14: qty 1

## 2017-03-14 MED ORDER — IBUPROFEN 800 MG PO TABS
800.0000 mg | ORAL_TABLET | Freq: Once | ORAL | Status: AC
Start: 2017-03-14 — End: 2017-03-14
  Administered 2017-03-14: 800 mg via ORAL
  Filled 2017-03-14: qty 1

## 2017-03-14 MED ORDER — IBUPROFEN 800 MG PO TABS: 800.0000 mg | ORAL_TABLET | Freq: Three times a day (TID) | ORAL | 0 refills | Status: DC | PRN

## 2017-03-14 MED ORDER — HYDROCODONE-ACETAMINOPHEN 5-325 MG PO TABS: 1.0000 | ORAL_TABLET | Freq: Four times a day (QID) | ORAL | 0 refills | Status: DC | PRN

## 2017-03-14 NOTE — ED Notes (Signed)
Pt states she slipped this pm getting of the care. Pt complains of pain from right knee and right ankle. Pt with lateral right ankle swelling noted. Cms intact to right toes.

## 2017-03-14 NOTE — ED Notes (Signed)
Ice applied to right ankle after pt placed in room.

## 2017-03-14 NOTE — Discharge Instructions (Signed)
1. You may take pain medicines as needed (Motrin/Norco #15). 2. You may wear ankle splint and Ace bandage as needed for comfort. 3. Use walker for balance as you ambulate. Use as needed. 4. Elevate affected areas and apply ice over splint several times daily. 5. Return to the ER for worsening symptoms, persistent vomiting, difficulty breathing or other concerns.

## 2017-03-14 NOTE — ED Provider Notes (Signed)
[facility] Emergency Department Provider Note   ____________________________________________   First MD Initiated Contact with Patient 03/14/17 0155     (approximate)  I have reviewed the triage vital signs and the nursing notes.   HISTORY  Chief Complaint Fall; Ankle Pain (Right); and Knee Pain (Right )    HPI Erin Contreras is a 56 y.o. female who presents to the ED from home via EMS with a chief complaint of fall with right knee and ankle pain. Patient slipped in wet grass while wearing flip-flops and fell, striking her right knee and twisting her right ankle. Denies striking head or LOC. Complains of pain and swelling to right knee and ankle. Voices no other complaints. Specifically, denies headache, vision changes, neck pain, chest pain, shortness of breath, abdominal pain, nausea, vomiting.nothing makes her symptoms better. Ambulation and movement make her symptoms worse.   Past Medical History:  Diagnosis Date  . Boils   . Ear mass   . Hemorrhoids   . Herpes simplex virus infection   . HSV infection   . Lumbago with sciatica   . OA (osteoarthritis) of knee   . Obesity   . Overactive bladder   . RA (rheumatoid arthritis) ([place])   . Urge and stress incontinence     Patient Active Problem List   Diagnosis Date Noted  . Piriformis syndrome of right side 12/23/2015  . Knee pain, right 12/02/2015  . Pre-ulcerative corn or callous 12/02/2015  . OA (osteoarthritis) of knee   . RA (rheumatoid arthritis) ([place])   . Obesity   . Urge and stress incontinence   . Lumbago with sciatica   . Rectal bleeding 01/17/2013    Past Surgical History:  Procedure Laterality Date  . CHOLECYSTECTOMY  2011  . SLEEVE GASTROPLASTY    . TUMOR REMOVAL Right 2010   ear    Prior to Admission medications   Medication Sig Start Date End Date Taking? Authorizing Provider  acyclovir (ZOVIRAX) 400 MG tablet Take 1 tablet (400 mg total) by mouth 2 (two) times  daily as needed. 01/14/17   Kathrine Haddock, NP  Golimumab (SIMPONI) 50 MG/0.5ML SOAJ Inject into the skin every 30 (thirty) days.    [provider]  HYDROcodone-acetaminophen (NORCO) 5-325 MG tablet Take 1 tablet by mouth every 6 (six) hours as needed for moderate pain. 03/14/17   Paulette Blanch, MD  ibuprofen (ADVIL,MOTRIN) 800 MG tablet Take 1 tablet (800 mg total) by mouth every 8 (eight) hours as needed for moderate pain. 03/14/17   Paulette Blanch, MD  Multiple Vitamin (MULTIVITAMIN) tablet Take 1 tablet by mouth daily.    [provider]    Allergies Patient has no known allergies.  Family History  Problem Relation Age of Onset  . Cancer Mother        liver  . Diabetes Mother   . Hyperlipidemia Mother   . Hypertension Mother   . Cirrhosis Father        liver  . Hypertension Daughter   . Stroke Maternal Grandmother   . Stroke Maternal Grandfather   . Heart disease Paternal Grandmother   . Diabetes Paternal Grandmother   . Heart disease Paternal Grandfather   . Diabetes Paternal Grandfather     Social History Social History  Substance Use Topics  . Smoking status: Never Smoker  . Smokeless tobacco: Never Used  . Alcohol use No    Review of Systems  Constitutional: No fever/chills. Eyes: No visual  changes. ENT: No sore throat. Cardiovascular: Denies chest pain. Respiratory: Denies shortness of breath. Gastrointestinal: No abdominal pain.  No nausea, no vomiting.  No diarrhea.  No constipation. Genitourinary: Negative for dysuria. Musculoskeletal: Positive for right knee and ankle pan. Negative for back pain. Skin: Negative for rash. Neurological: Negative for headaches, focal weakness or numbness.   ____________________________________________   PHYSICAL EXAM:  VITAL SIGNS: ED Triage Vitals  Enc Vitals Group     BP 03/14/17 0050 102/71     Pulse Rate 03/14/17 0050 64     Resp 03/14/17 0050 16     Temp 03/14/17 0050 98.2 F (36.8 C)     Temp  Source 03/14/17 0050 Oral     SpO2 03/14/17 0050 98 %     Weight 03/13/17 2323 249 lb (112.9 kg)     Height 03/13/17 2323 5\' 10"  (1.778 m)     Head Circumference --      Peak Flow --      Pain Score 03/13/17 2322 10     Pain Loc --      Pain Edu? --      Excl. in [place]? --     Constitutional: Alert and oriented. Well appearing and in no acute distress. Eyes: Conjunctivae are normal. PERRL. EOMI. Head: Atraumatic. Nose: No congestion/rhinnorhea. Mouth/Throat: Mucous membranes are moist.  Oropharynx non-erythematous. Neck: No stridor.  No cervical spine tenderness to palpation. Cardiovascular: Normal rate, regular rhythm. Grossly normal heart sounds.  Good peripheral circulation. Respiratory: Normal respiratory effort.  No retractions. Lungs CTAB. Gastrointestinal: Soft and nontender. No distention. No abdominal bruits. No CVA tenderness. Musculoskeletal:  RLE: right anterior knee tender to palpation with slight lateral effusion. Limited range of motion secondary to pain. Right lateral ankle with mild effusion and tenderness palpation. Limited range of motion secondary to pain. 2+ distal pulses Brisk, less than5 second capillary refill. Neurologic:  Normal speech and language. No gross focal neurologic deficits are appreciated.  Skin:  Skin is warm, dry and intact. No rash noted. Psychiatric: Mood and affect are normal. Speech and behavior are normal.  ____________________________________________   LABS (all labs ordered are listed, but only abnormal results are displayed)  Labs Reviewed - No data to display ____________________________________________  EKG  None ____________________________________________  RADIOLOGY  Dg Ankle Complete Right  Result Date: 03/14/2017 CLINICAL DATA:  Acute onset of right ankle pain and swelling. Initial encounter. EXAM: RIGHT ANKLE - COMPLETE 3+ VIEW COMPARISON:  None. FINDINGS: There is no evidence of fracture or dislocation. The ankle mortise  is intact; the interosseous space is within normal limits. No talar tilt or subluxation is seen. Mild degenerative change is noted at the midfoot. A plantar calcaneal spur is noted. An ankle joint effusion is noted. Mild calcification along the plantar fascia may reflect chronic changes of plantar fasciitis, or possibly prior injury. IMPRESSION: 1. No evidence of fracture or dislocation. 2. Ankle joint effusion noted. Electronically Signed   By: Garald Balding M.D.   On: 03/14/2017 00:36   Dg Knee Complete 4 Views Right  Result Date: 03/14/2017 CLINICAL DATA:  Acute onset of right knee pain and swelling. Initial encounter. EXAM: RIGHT KNEE - COMPLETE 4+ VIEW COMPARISON:  Right knee radiographs performed 09/27/2014 FINDINGS: There is no evidence of fracture or dislocation. There is narrowing of the patellofemoral compartment. Marginal osteophytes are seen arising at all 3 compartments. Tibial spine osteophytes are also seen. Mild cortical irregularity is noted along the medial tibial plateau. No significant joint effusion  is seen. The visualized soft tissues are normal in appearance. IMPRESSION: 1. No evidence of fracture or dislocation. 2. Mild tricompartmental osteoarthritis. Narrowing of the patellofemoral compartment. Electronically Signed   By: Garald Balding M.D.   On: 03/14/2017 00:35    ____________________________________________   PROCEDURES  Procedure(s) performed: None  Procedures  Critical Care performed: No  ____________________________________________   INITIAL IMPRESSION / ASSESSMENT AND PLAN / ED COURSE  Pertinent labs & imaging results that were available during my care of the patient were reviewed by me and considered in my medical decision making (see chart for details).  56 year old female who presents status post mechanical fall with knee and ankle pain. No fracture walker for balance and patient will follow-up with orthopedics next week. Prescription for NSAIDs and  analgesia provi. Strict return precautions given. Patient and daughter verbalize understanding and agree with plan of care.     ____________________________________________   FINAL CLINICAL IMPRESSION(S) / ED DIAGNOSES  Final diagnoses:  Fall, initial encounter  Sprain of right ankle, unspecified ligament, initial encounter  Acute pain of right knee      NEW MEDICATIONS STARTED DURING THIS VISIT:  New Prescriptions   HYDROCODONE-ACETAMINOPHEN (NORCO) 5-325 MG TABLET    Take 1 tablet by mouth every 6 (six) hours as needed for moderate pain.   IBUPROFEN (ADVIL,MOTRIN) 800 MG TABLET    Take 1 tablet (800 mg total) by mouth every 8 (eight) hours as needed for moderate pain.     Note:  This document was prepared using Dragon voice recognition software and may include unintentional dictation errors.    Paulette Blanch, MD 03/14/17 [phone]

## 2017-03-16 DIAGNOSIS — M2391 Unspecified internal derangement of right knee: Secondary | ICD-10-CM | POA: Diagnosis not present

## 2017-03-17 ENCOUNTER — Ambulatory Visit (INDEPENDENT_AMBULATORY_CARE_PROVIDER_SITE_OTHER): Payer: BLUE CROSS/BLUE SHIELD | Admitting: Family Medicine

## 2017-03-17 ENCOUNTER — Encounter: Payer: Self-pay | Admitting: Family Medicine

## 2017-03-17 VITALS — BP 116/80 | HR 62 | Temp 98.5°F | Wt 248.0 lb

## 2017-03-17 DIAGNOSIS — B009 Herpesviral infection, unspecified: Secondary | ICD-10-CM

## 2017-03-17 DIAGNOSIS — B372 Candidiasis of skin and nail: Secondary | ICD-10-CM | POA: Diagnosis not present

## 2017-03-17 DIAGNOSIS — N898 Other specified noninflammatory disorders of vagina: Secondary | ICD-10-CM | POA: Diagnosis not present

## 2017-03-17 LAB — WET PREP FOR TRICH, YEAST, CLUE
Clue Cell Exam: NEGATIVE
Trichomonas Exam: NEGATIVE
Yeast Exam: NEGATIVE

## 2017-03-17 MED ORDER — FLUCONAZOLE 150 MG PO TABS: 150.0000 mg | ORAL_TABLET | Freq: Once | ORAL | 0 refills | Status: AC

## 2017-03-17 MED ORDER — NYSTATIN 100000 UNIT/GM EX CREA: 1.0000 "application " | TOPICAL_CREAM | Freq: Two times a day (BID) | CUTANEOUS | 0 refills | Status: DC

## 2017-03-17 MED ORDER — ACYCLOVIR 400 MG PO TABS: 400.0000 mg | ORAL_TABLET | Freq: Two times a day (BID) | ORAL | 3 refills | Status: DC | PRN

## 2017-03-17 NOTE — Progress Notes (Signed)
BP 116/80   Pulse 62   Temp 98.5 F (36.9 C)   Wt 248 lb (112.5 kg)   SpO2 98%   BMI 35.58 kg/m    Subjective:    Patient ID: Erin Contreras, female    DOB: 08/28/1960, 56 y.o.   MRN: [id number]  HPI: Erin Contreras is a 56 y.o. female  Chief Complaint  Patient presents with  . Rash    noticed it 4 days ago. in her naval, doesn't itch but stays it burns sometimes and there is an odor.  . Vaginal Discharge    x 1 week, feels slightly itchy.  . Medication Refill    she'd like a refill on cyclobenzaprine   Patient presents today with 4 day hx of rash in her navel that has been itchy, irritated, and malodorous. Denies discharge, fever, chills, injury to the area. Has not been trying anything OTC for it.   Also having about a week of vaginal itching and irritation. No noticed abnormal discharge, lesions, urinary sxs.   Having more frequent HSV flares. Currently on prn acyclovir but wanting to discuss daily suppressive therapy.   Past Medical History:  Diagnosis Date  . Boils   . Ear mass   . Hemorrhoids   . Herpes simplex virus infection   . HSV infection   . Lumbago with sciatica   . OA (osteoarthritis) of knee   . Obesity   . Overactive bladder   . RA (rheumatoid arthritis) ([place])   . Urge and stress incontinence    Social History   Social History  . Marital status: Single    Spouse name: N/A  . Number of children: N/A  . Years of education: N/A   Occupational History  . Not on file.   Social History Main Topics  . Smoking status: Never Smoker  . Smokeless tobacco: Never Used  . Alcohol use No  . Drug use: No  . Sexual activity: Not on file   Other Topics Concern  . Not on file   Social History Narrative  . No narrative on file   Relevant past medical, surgical, family and social history reviewed and updated as indicated. Interim medical history since our last visit reviewed. Allergies and medications reviewed and updated.  Review of Systems    Constitutional: Negative.   HENT:       Cold sores  Respiratory: Negative.   Cardiovascular: Negative.   Gastrointestinal: Negative.   Genitourinary:       Vaginal itching  Musculoskeletal: Negative.   Skin: Positive for rash.  Neurological: Negative.   Psychiatric/Behavioral: Negative.    Per HPI unless specifically indicated above     Objective:    BP 116/80   Pulse 62   Temp 98.5 F (36.9 C)   Wt 248 lb (112.5 kg)   SpO2 98%   BMI 35.58 kg/m   Wt Readings from Last 3 Encounters:  03/17/17 248 lb (112.5 kg)  03/13/17 249 lb (112.9 kg)  09/13/16 246 lb (111.6 kg)    Physical Exam  Constitutional: She is oriented to person, place, and time. She appears well-developed and well-nourished. No distress.  HENT:  Head: Atraumatic.  Eyes: Pupils are equal, round, and reactive to light. Conjunctivae are normal. No scleral icterus.  Neck: Normal range of motion. Neck supple.  Cardiovascular: Normal rate and normal heart sounds.   Pulmonary/Chest: Effort normal and breath sounds normal. No respiratory distress.  Abdominal: Soft. Bowel sounds are normal. There  is no tenderness.  Genitourinary: Vaginal discharge (minimal) found.  Musculoskeletal: Normal range of motion.  Neurological: She is alert and oriented to person, place, and time.  Skin: Skin is warm and dry. Rash (macerated, erythematous navel and surrounding areas) noted.  Psychiatric: She has a normal mood and affect. Her behavior is normal.  Nursing note and vitals reviewed.     Assessment & Plan:   Problem List Items Addressed This Visit      Other   Herpes simplex virus infection    Given frequent flares, will switch to daily suppressive dosing. F/u if any issues      Relevant Medications   nystatin cream (MYCOSTATIN)   acyclovir (ZOVIRAX) 400 MG tablet   fluconazole (DIFLUCAN) 150 MG tablet    Other Visit Diagnoses    Vaginal discharge    -  Primary   Wet prep negative. Will treat with 1 dose of  diflucan to cover for yeast. F/u if no improvement   Relevant Orders   WET PREP FOR [place], YEAST, CLUE   Cutaneous candidiasis       Will treat with nystatin cream. Keep area clean and dry, use powders as needed   Relevant Medications   nystatin cream (MYCOSTATIN)   acyclovir (ZOVIRAX) 400 MG tablet   fluconazole (DIFLUCAN) 150 MG tablet       Follow up plan: Return if symptoms worsen or fail to improve.

## 2017-03-17 NOTE — Patient Instructions (Signed)
Follow up as needed

## 2017-03-17 NOTE — Assessment & Plan Note (Signed)
Given frequent flares, will switch to daily suppressive dosing. F/u if any issues

## 2017-03-29 DIAGNOSIS — M1711 Unilateral primary osteoarthritis, right knee: Secondary | ICD-10-CM | POA: Diagnosis not present

## 2017-03-29 DIAGNOSIS — M25561 Pain in right knee: Secondary | ICD-10-CM | POA: Diagnosis not present

## 2017-04-21 DIAGNOSIS — M1711 Unilateral primary osteoarthritis, right knee: Secondary | ICD-10-CM | POA: Diagnosis not present

## 2017-04-27 DIAGNOSIS — R918 Other nonspecific abnormal finding of lung field: Secondary | ICD-10-CM | POA: Insufficient documentation

## 2017-04-27 DIAGNOSIS — G4733 Obstructive sleep apnea (adult) (pediatric): Secondary | ICD-10-CM | POA: Insufficient documentation

## 2017-06-29 ENCOUNTER — Other Ambulatory Visit: Payer: Self-pay

## 2017-06-29 ENCOUNTER — Encounter
Admission: RE | Admit: 2017-06-29 | Discharge: 2017-06-29 | Disposition: A | Discharge status: Home / Self Care | Payer: BLUE CROSS/BLUE SHIELD | Source: Ambulatory Visit | Attending: Orthopedic Surgery | Admitting: Orthopedic Surgery

## 2017-06-29 DIAGNOSIS — Z0183 Encounter for blood typing: Secondary | ICD-10-CM | POA: Insufficient documentation

## 2017-06-29 DIAGNOSIS — Z01812 Encounter for preprocedural laboratory examination: Secondary | ICD-10-CM | POA: Diagnosis not present

## 2017-06-29 HISTORY — DX: Other specified postprocedural states: Z98.890

## 2017-06-29 HISTORY — DX: Nausea with vomiting, unspecified: R11.2

## 2017-06-29 HISTORY — DX: Sleep apnea, unspecified: G47.30

## 2017-06-29 LAB — CBC
HEMATOCRIT: 41.3 % (ref 35.0–47.0)
Hemoglobin: 13.6 g/dL (ref 12.0–16.0)
MCH: 28.8 pg (ref 26.0–34.0)
MCHC: 32.9 g/dL (ref 32.0–36.0)
MCV: 87.5 fL (ref 80.0–100.0)
Platelets: 240 10*3/uL (ref 150–440)
RBC: 4.72 MIL/uL (ref 3.80–5.20)
RDW: 13.3 % (ref 11.5–14.5)
WBC: 5.4 10*3/uL (ref 3.6–11.0)

## 2017-06-29 LAB — COMPREHENSIVE METABOLIC PANEL
ALBUMIN: 3.9 g/dL (ref 3.5–5.0)
ALK PHOS: 92 U/L (ref 38–126)
ALT: 26 U/L (ref 14–54)
ANION GAP: 10 (ref 5–15)
AST: 24 U/L (ref 15–41)
BILIRUBIN TOTAL: 0.7 mg/dL (ref 0.3–1.2)
BUN: 18 mg/dL (ref 6–20)
CALCIUM: 9.2 mg/dL (ref 8.9–10.3)
CO2: 25 mmol/L (ref 22–32)
CREATININE: 0.57 mg/dL (ref 0.44–1.00)
Chloride: 104 mmol/L (ref 101–111)
GFR calc non Af Amer: >60 mL/min (ref >60)
GLUCOSE: 90 mg/dL (ref 65–99)
Potassium: 4 mmol/L (ref 3.5–5.1)
Sodium: 139 mmol/L (ref 135–145)
TOTAL PROTEIN: 7.8 g/dL (ref 6.5–8.1)

## 2017-06-29 LAB — TYPE AND SCREEN
ABO/RH(D): A POS
Antibody Screen: NEGATIVE

## 2017-06-29 LAB — C-REACTIVE PROTEIN: CRP: <0.8 mg/dL (ref <1.0)

## 2017-06-29 LAB — PROTIME-INR
INR: 0.9
Prothrombin Time: 12.1 seconds (ref 11.4–15.2)

## 2017-06-29 LAB — URINALYSIS, ROUTINE W REFLEX MICROSCOPIC
Bilirubin Urine: NEGATIVE
Glucose, UA: NEGATIVE mg/dL
HGB URINE DIPSTICK: NEGATIVE
Ketones, ur: NEGATIVE mg/dL
LEUKOCYTES UA: NEGATIVE
Nitrite: NEGATIVE
Protein, ur: NEGATIVE mg/dL
SPECIFIC GRAVITY, URINE: 1.02 (ref 1.005–1.030)
pH: 7 (ref 5.0–8.0)

## 2017-06-29 LAB — APTT: aPTT: 26 seconds (ref 24–36)

## 2017-06-29 LAB — SURGICAL PCR SCREEN
MRSA, PCR: NEGATIVE
Staphylococcus aureus: NEGATIVE

## 2017-06-29 LAB — SEDIMENTATION RATE: Sed Rate: 35 mm/hr — ABNORMAL HIGH (ref 0–30)

## 2017-06-29 NOTE — Pre-Procedure Instructions (Signed)
Abnormal labs faxed to Dr [facility] [employer] office.

## 2017-06-29 NOTE — Patient Instructions (Signed)
Your procedure is scheduled on: July 11, 2017 MONDAY Report to [place] To find out your arrival time please call [phone] between 1PM - 3PM on Friday July 08, 2017   Remember: Instructions that are not followed completely may result in serious medical risk, up to and including death, or upon the discretion of your surgeon and anesthesiologist your surgery may need to be rescheduled.     _X__ 1. Do not eat food after midnight the night before your procedure.                 No gum chewing or hard candies. You may drink clear liquids up to 2 hours                 before you are scheduled to arrive for your surgery- DO not drink clear                 liquids within 2 hours of the start of your surgery.                 Clear Liquids include:  water, apple juice without pulp,                  drink such as Clearfast of [place]art[place], Black Coffee or Tea (Do not add                 anything to coffee or tea).     _X__ 2.  No Alcohol for 24 hours before or after surgery.   _X__ 3.  Do Not Smoke or use e-cigarettes For 24 Hours Prior to Your Surgery.                 Do not use any chewable tobacco products for at least 6 hours prior to                 surgery.  ___X_  4.  Bring all NEW medications with you on the day of surgery if instructed.   ___X_  5.  Notify your doctor if there is any change in your medical condition      (cold, fever, infections).     Do not wear jewelry, make-up, hairpins, clips or nail polish. Do not wear lotions, powders, or perfumes. You may wear deodorant. Do not shave 48 hours prior to surgery. Men may shave face and neck. Do not bring valuables to the hospital.    [facility] is not responsible for any belongings or valuables.  Contacts, dentures or bridgework may not be worn into surgery. Leave your suitcase in the car. After surgery it may be brought to your room. For patients admitted to the hospital,  discharge time is determined by your treatment team.   Patients discharged the day of surgery will not be allowed to drive home.   Please read over the following fact sheets that you were given:   CHG SHEETI          ____ Take these medicines the morning of surgery with A SIP OF WATER:    1.NONE  2.   3.   4.  5.  6.     __X__ Use CHG Soap as directed  __X__ Stop Anti-inflammatories ON July 01, 2017 IBUPROFEN, MOTRIN, ADVIL, ALEVE, GOODY'S POWDER, ASPIRIN, NAPROSYN   ____ Stop supplements until after surgery.    __X__ Bring C-Pap to the hospital.

## 2017-06-30 LAB — URINE CULTURE
CULTURE: NO GROWTH
Special Requests: NORMAL

## 2017-07-08 ENCOUNTER — Encounter: Payer: Self-pay | Admitting: Family Medicine

## 2017-07-08 ENCOUNTER — Ambulatory Visit: Payer: BLUE CROSS/BLUE SHIELD | Admitting: Family Medicine

## 2017-07-08 VITALS — BP 116/73 | HR 71 | Wt 259.0 lb

## 2017-07-08 DIAGNOSIS — N898 Other specified noninflammatory disorders of vagina: Secondary | ICD-10-CM

## 2017-07-08 DIAGNOSIS — R350 Frequency of micturition: Secondary | ICD-10-CM

## 2017-07-08 DIAGNOSIS — R21 Rash and other nonspecific skin eruption: Secondary | ICD-10-CM | POA: Diagnosis not present

## 2017-07-08 LAB — UA/M W/RFLX CULTURE, ROUTINE
Bilirubin, UA: NEGATIVE
GLUCOSE, UA: NEGATIVE
Ketones, UA: NEGATIVE
Leukocytes, UA: NEGATIVE
Nitrite, UA: NEGATIVE
PH UA: 5.5 (ref 5.0–7.5)
PROTEIN UA: NEGATIVE
RBC, UA: NEGATIVE
Specific Gravity, UA: >1.03 — ABNORMAL HIGH (ref 1.005–1.030)
Urobilinogen, Ur: 0.2 mg/dL (ref 0.2–1.0)

## 2017-07-08 LAB — WET PREP FOR TRICH, YEAST, CLUE
Clue Cell Exam: NEGATIVE
Trichomonas Exam: NEGATIVE
Yeast Exam: NEGATIVE

## 2017-07-08 MED ORDER — NYSTATIN 100000 UNIT/GM EX POWD: Freq: Four times a day (QID) | CUTANEOUS | 0 refills | Status: DC

## 2017-07-08 NOTE — Progress Notes (Signed)
BP 116/73   Pulse 71   Wt 259 lb (117.5 kg)   SpO2 98%   BMI 38.25 kg/m    Subjective:    Patient ID: Erin Contreras, female    DOB: 20-Oct-1960, 56 y.o.   MRN: [id number]  HPI: Erin Contreras is a 56 y.o. female  Chief Complaint  Patient presents with  . Urinary Frequency  . Smell   Excessive clear vaginal discharge, urinary frequency, mid - low back pain since August visit. Seems similar to then but worse. At that visit, UA and wet prep were benign. Denies concern for STIs, dysuria, hematuria, fevers, N/V.  Also still having rash at belly button which she states stinks often. Has been using the nystatin cream with some improvement but still having problems with it. Denies drainage from area.  Past Medical History:  Diagnosis Date  . Boils   . Ear mass   . Hemorrhoids   . Herpes simplex virus infection   . HSV infection   . Lumbago with sciatica   . OA (osteoarthritis) of knee   . Obesity   . Overactive bladder   . PONV (postoperative nausea and vomiting)   . RA (rheumatoid arthritis) ([place])   . Sleep apnea   . Urge and stress incontinence    Social History   Socioeconomic History  . Marital status: Single    Spouse name: Not on file  . Number of children: Not on file  . Years of education: Not on file  . Highest education level: Not on file  Social Needs  . Financial resource strain: Not on file  . Food insecurity - worry: Not on file  . Food insecurity - inability: Not on file  . Transportation needs - medical: Not on file  . Transportation needs - non-medical: Not on file  Occupational History  . Not on file  Tobacco Use  . Smoking status: Never Smoker  . Smokeless tobacco: Never Used  Substance and Sexual Activity  . Alcohol use: No  . Drug use: No  . Sexual activity: Not on file  Other Topics Concern  . Not on file  Social History Narrative  . Not on file   Relevant past medical, surgical, family and social history reviewed and updated as  indicated. Interim medical history since our last visit reviewed. Allergies and medications reviewed and updated.  Review of Systems  Constitutional: Negative.   HENT: Negative.   Respiratory: Negative.   Cardiovascular: Negative.   Gastrointestinal: Negative.   Genitourinary: Positive for frequency and vaginal discharge.  Musculoskeletal: Positive for back pain.  Neurological: Negative.   Psychiatric/Behavioral: Negative.    Per HPI unless specifically indicated above     Objective:    BP 116/73   Pulse 71   Wt 259 lb (117.5 kg)   SpO2 98%   BMI 38.25 kg/m   Wt Readings from Last 3 Encounters:  07/08/17 259 lb (117.5 kg)  06/29/17 258 lb (117 kg)  03/17/17 248 lb (112.5 kg)    Physical Exam  Constitutional: She is oriented to person, place, and time. She appears well-developed and well-nourished. No distress.  HENT:  Head: Atraumatic.  Eyes: Conjunctivae are normal. Pupils are equal, round, and reactive to light.  Neck: Normal range of motion. Neck supple.  Cardiovascular: Normal rate and normal heart sounds.  Pulmonary/Chest: Effort normal and breath sounds normal. No respiratory distress. She has no wheezes.  Genitourinary: Vaginal discharge (benign appearing) found.  Musculoskeletal: Normal range  of motion.  Neurological: She is alert and oriented to person, place, and time.  Skin: Skin is warm and dry.  Psychiatric: She has a normal mood and affect. Her behavior is normal.  Nursing note and vitals reviewed.  Results for orders placed or performed in visit on 07/08/17  WET PREP FOR [place], YEAST, CLUE  Result Value Ref Range   Trichomonas Exam Negative Negative   Yeast Exam Negative Negative   Clue Cell Exam Negative Negative  UA/M w/rflx Culture, Routine  Result Value Ref Range   Specific Gravity, UA >1.030 (H) 1.005 - 1.030   pH, UA 5.5 5.0 - 7.5   Color, UA Yellow Yellow   Appearance Ur Hazy (A) Clear   Leukocytes, UA Negative Negative   Protein, UA  Negative Negative/Trace   Glucose, UA Negative Negative   Ketones, UA Negative Negative   RBC, UA Negative Negative   Bilirubin, UA Negative Negative   Urobilinogen, Ur 0.2 0.2 - 1.0 mg/dL   Nitrite, UA Negative Negative      Assessment & Plan:   Problem List Items Addressed This Visit    None    Visit Diagnoses    Urinary frequency    -  Primary   U/A negative for UTI, pt admits to drinking minimal plain water. Will increase water intake and start probiotic supplement.    Relevant Orders   UA/M w/rflx Culture, Routine (Completed)   WET PREP FOR [place], YEAST, CLUE (Completed)   Vaginal discharge       Wet prep negative, discussed probiotics, avoiding sugary beverages and foods, drinking more plain water.    Rash       Some benefit with nystatin cream, will add nystatin powder to help keep area more dry. Clean twice daily       Follow up plan: Return if symptoms worsen or fail to improve.

## 2017-07-10 MED ORDER — CEFAZOLIN SODIUM-DEXTROSE 2-4 GM/100ML-% IV SOLN
2.0000 g | INTRAVENOUS | Status: AC
  Administered 2017-07-11: 2 g via INTRAVENOUS

## 2017-07-10 MED ORDER — TRANEXAMIC ACID 1000 MG/10ML IV SOLN
1000.0000 mg | INTRAVENOUS | Status: DC
  Filled 2017-07-10: qty 10

## 2017-07-11 ENCOUNTER — Inpatient Hospital Stay
Admission: RE | Admit: 2017-07-11 | Discharge: 2017-07-13 | DRG: 470 | Disposition: A | Discharge status: Home Health | Payer: BLUE CROSS/BLUE SHIELD | Source: Ambulatory Visit | Attending: Orthopedic Surgery | Admitting: Orthopedic Surgery

## 2017-07-11 ENCOUNTER — Inpatient Hospital Stay: Payer: BLUE CROSS/BLUE SHIELD | Admitting: Anesthesiology

## 2017-07-11 ENCOUNTER — Other Ambulatory Visit: Payer: Self-pay

## 2017-07-11 ENCOUNTER — Encounter
Admission: RE | Disposition: A | Discharge status: Home Health | Payer: Self-pay | Source: Ambulatory Visit | Attending: Orthopedic Surgery

## 2017-07-11 ENCOUNTER — Encounter: Payer: Self-pay | Admitting: Orthopedic Surgery

## 2017-07-11 ENCOUNTER — Inpatient Hospital Stay: Payer: BLUE CROSS/BLUE SHIELD

## 2017-07-11 DIAGNOSIS — Z96651 Presence of right artificial knee joint: Secondary | ICD-10-CM | POA: Diagnosis not present

## 2017-07-11 DIAGNOSIS — K219 Gastro-esophageal reflux disease without esophagitis: Secondary | ICD-10-CM | POA: Diagnosis not present

## 2017-07-11 DIAGNOSIS — I10 Essential (primary) hypertension: Secondary | ICD-10-CM | POA: Diagnosis present

## 2017-07-11 DIAGNOSIS — R918 Other nonspecific abnormal finding of lung field: Secondary | ICD-10-CM | POA: Diagnosis not present

## 2017-07-11 DIAGNOSIS — M1711 Unilateral primary osteoarthritis, right knee: Secondary | ICD-10-CM | POA: Diagnosis not present

## 2017-07-11 DIAGNOSIS — E669 Obesity, unspecified: Secondary | ICD-10-CM | POA: Diagnosis present

## 2017-07-11 DIAGNOSIS — N3281 Overactive bladder: Secondary | ICD-10-CM | POA: Diagnosis not present

## 2017-07-11 DIAGNOSIS — G4733 Obstructive sleep apnea (adult) (pediatric): Secondary | ICD-10-CM | POA: Diagnosis not present

## 2017-07-11 DIAGNOSIS — Z96659 Presence of unspecified artificial knee joint: Secondary | ICD-10-CM

## 2017-07-11 DIAGNOSIS — Z9884 Bariatric surgery status: Secondary | ICD-10-CM

## 2017-07-11 DIAGNOSIS — B009 Herpesviral infection, unspecified: Secondary | ICD-10-CM | POA: Diagnosis not present

## 2017-07-11 DIAGNOSIS — Z9049 Acquired absence of other specified parts of digestive tract: Secondary | ICD-10-CM | POA: Diagnosis not present

## 2017-07-11 DIAGNOSIS — Z471 Aftercare following joint replacement surgery: Secondary | ICD-10-CM | POA: Diagnosis not present

## 2017-07-11 DIAGNOSIS — M069 Rheumatoid arthritis, unspecified: Secondary | ICD-10-CM | POA: Diagnosis not present

## 2017-07-11 DIAGNOSIS — Z6838 Body mass index (BMI) 38.0-38.9, adult: Secondary | ICD-10-CM | POA: Diagnosis not present

## 2017-07-11 DIAGNOSIS — N3946 Mixed incontinence: Secondary | ICD-10-CM | POA: Diagnosis present

## 2017-07-11 DIAGNOSIS — M25761 Osteophyte, right knee: Secondary | ICD-10-CM | POA: Diagnosis not present

## 2017-07-11 HISTORY — PX: KNEE ARTHROPLASTY: SHX992

## 2017-07-11 LAB — ABO/RH: ABO/RH(D): A POS

## 2017-07-11 SURGERY — ARTHROPLASTY, KNEE, TOTAL, USING IMAGELESS COMPUTER-ASSISTED NAVIGATION
Anesthesia: Spinal | Laterality: Right | Wound class: Clean

## 2017-07-11 MED ORDER — DEXAMETHASONE SODIUM PHOSPHATE 10 MG/ML IJ SOLN
10.0000 mg | Freq: Once | INTRAMUSCULAR | Status: AC
  Administered 2017-07-11: 10 mg via INTRAVENOUS

## 2017-07-11 MED ORDER — ACETAMINOPHEN 10 MG/ML IV SOLN
1000.0000 mg | Freq: Four times a day (QID) | INTRAVENOUS | Status: AC
  Administered 2017-07-11 – 2017-07-12 (×4): 1000 mg via INTRAVENOUS
  Filled 2017-07-11 (×4): qty 100

## 2017-07-11 MED ORDER — ENOXAPARIN SODIUM 30 MG/0.3ML ~~LOC~~ SOLN
30.0000 mg | Freq: Two times a day (BID) | SUBCUTANEOUS | Status: DC
  Administered 2017-07-12 – 2017-07-13 (×3): 30 mg via SUBCUTANEOUS
  Filled 2017-07-11 (×3): qty 0.3

## 2017-07-11 MED ORDER — PROPOFOL 500 MG/50ML IV EMUL
INTRAVENOUS | Status: AC
  Filled 2017-07-11: qty 50

## 2017-07-11 MED ORDER — ONDANSETRON HCL 4 MG/2ML IJ SOLN
INTRAMUSCULAR | Status: AC
  Filled 2017-07-11: qty 2

## 2017-07-11 MED ORDER — ONDANSETRON HCL 4 MG/2ML IJ SOLN
INTRAMUSCULAR | Status: AC
  Administered 2017-07-11: 4 mg via INTRAVENOUS
  Filled 2017-07-11: qty 2

## 2017-07-11 MED ORDER — OXYCODONE HCL 5 MG PO TABS: 5.0000 mg | ORAL_TABLET | Freq: Once | ORAL | Status: DC | PRN

## 2017-07-11 MED ORDER — FERROUS SULFATE 325 (65 FE) MG PO TABS
325.0000 mg | ORAL_TABLET | Freq: Two times a day (BID) | ORAL | Status: DC
  Administered 2017-07-12 – 2017-07-13 (×3): 325 mg via ORAL
  Filled 2017-07-11 (×3): qty 1

## 2017-07-11 MED ORDER — ONDANSETRON HCL 4 MG/2ML IJ SOLN
4.0000 mg | Freq: Four times a day (QID) | INTRAMUSCULAR | Status: DC | PRN
  Administered 2017-07-11: 4 mg via INTRAVENOUS
  Filled 2017-07-11: qty 2

## 2017-07-11 MED ORDER — CEFAZOLIN SODIUM-DEXTROSE 2-4 GM/100ML-% IV SOLN
INTRAVENOUS | Status: AC
  Filled 2017-07-11: qty 100

## 2017-07-11 MED ORDER — FAMOTIDINE 20 MG PO TABS
ORAL_TABLET | ORAL | Status: AC
  Administered 2017-07-11: 20 mg via ORAL
  Filled 2017-07-11: qty 1

## 2017-07-11 MED ORDER — NEOMYCIN-POLYMYXIN B GU 40-200000 IR SOLN
Status: AC
  Filled 2017-07-11: qty 20

## 2017-07-11 MED ORDER — BUPIVACAINE LIPOSOME 1.3 % IJ SUSP
INTRAMUSCULAR | Status: AC
  Filled 2017-07-11: qty 20

## 2017-07-11 MED ORDER — OXYCODONE HCL 5 MG/5ML PO SOLN: 5.0000 mg | Freq: Once | ORAL | Status: DC | PRN

## 2017-07-11 MED ORDER — ACETAMINOPHEN 10 MG/ML IV SOLN
INTRAVENOUS | Status: DC | PRN
  Administered 2017-07-11: 1000 mg via INTRAVENOUS

## 2017-07-11 MED ORDER — FAMOTIDINE 20 MG PO TABS
20.0000 mg | ORAL_TABLET | Freq: Once | ORAL | Status: AC
  Administered 2017-07-11: 20 mg via ORAL

## 2017-07-11 MED ORDER — BUPIVACAINE HCL (PF) 0.25 % IJ SOLN
INTRAMUSCULAR | Status: DC | PRN
  Administered 2017-07-11: 60 mL

## 2017-07-11 MED ORDER — METOCLOPRAMIDE HCL 10 MG PO TABS
10.0000 mg | ORAL_TABLET | Freq: Three times a day (TID) | ORAL | Status: DC
  Administered 2017-07-11 – 2017-07-13 (×6): 10 mg via ORAL
  Filled 2017-07-11 (×7): qty 1

## 2017-07-11 MED ORDER — SODIUM CHLORIDE 0.9 % IV SOLN
INTRAVENOUS | Status: DC
  Administered 2017-07-11 – 2017-07-12 (×2): via INTRAVENOUS

## 2017-07-11 MED ORDER — FENTANYL CITRATE (PF) 100 MCG/2ML IJ SOLN: 25.0000 ug | INTRAMUSCULAR | Status: DC | PRN

## 2017-07-11 MED ORDER — ACETAMINOPHEN 10 MG/ML IV SOLN
INTRAVENOUS | Status: AC
  Filled 2017-07-11: qty 100

## 2017-07-11 MED ORDER — SCOPOLAMINE 1 MG/3DAYS TD PT72
MEDICATED_PATCH | TRANSDERMAL | Status: AC
  Administered 2017-07-11: 1.5 mg via TRANSDERMAL
  Filled 2017-07-11: qty 1

## 2017-07-11 MED ORDER — PROPOFOL 500 MG/50ML IV EMUL
INTRAVENOUS | Status: DC | PRN
  Administered 2017-07-11: 80 ug/kg/min via INTRAVENOUS

## 2017-07-11 MED ORDER — NYSTATIN 100000 UNIT/GM EX POWD
Freq: Four times a day (QID) | CUTANEOUS | Status: DC
  Administered 2017-07-11: 1 g via TOPICAL
  Administered 2017-07-12 (×2): via TOPICAL
  Filled 2017-07-11: qty 15

## 2017-07-11 MED ORDER — SCOPOLAMINE 1 MG/3DAYS TD PT72
1.0000 | MEDICATED_PATCH | Freq: Once | TRANSDERMAL | Status: DC
  Administered 2017-07-11: 1.5 mg via TRANSDERMAL

## 2017-07-11 MED ORDER — BUPIVACAINE HCL (PF) 0.25 % IJ SOLN
INTRAMUSCULAR | Status: AC
  Filled 2017-07-11: qty 60

## 2017-07-11 MED ORDER — ONDANSETRON HCL 4 MG PO TABS: 4.0000 mg | ORAL_TABLET | Freq: Four times a day (QID) | ORAL | Status: DC | PRN

## 2017-07-11 MED ORDER — ACETAMINOPHEN 325 MG PO TABS: 650.0000 mg | ORAL_TABLET | ORAL | Status: DC | PRN

## 2017-07-11 MED ORDER — MIDAZOLAM HCL 2 MG/2ML IJ SOLN
INTRAMUSCULAR | Status: AC
  Filled 2017-07-11: qty 2

## 2017-07-11 MED ORDER — ALUM & MAG HYDROXIDE-SIMETH 200-200-20 MG/5ML PO SUSP: 30.0000 mL | ORAL | Status: DC | PRN

## 2017-07-11 MED ORDER — CELECOXIB 200 MG PO CAPS
200.0000 mg | ORAL_CAPSULE | Freq: Two times a day (BID) | ORAL | Status: DC
  Administered 2017-07-11 – 2017-07-13 (×4): 200 mg via ORAL
  Filled 2017-07-11 (×4): qty 1

## 2017-07-11 MED ORDER — TRANEXAMIC ACID 1000 MG/10ML IV SOLN
INTRAVENOUS | Status: DC | PRN
  Administered 2017-07-11: 1000 mg via INTRAVENOUS

## 2017-07-11 MED ORDER — ACETAMINOPHEN 650 MG RE SUPP
650.0000 mg | RECTAL | Status: DC | PRN
Start: 2017-07-11 — End: 2017-07-13

## 2017-07-11 MED ORDER — DEXAMETHASONE SODIUM PHOSPHATE 10 MG/ML IJ SOLN
INTRAMUSCULAR | Status: AC
  Administered 2017-07-11: 10 mg via INTRAVENOUS
  Filled 2017-07-11: qty 1

## 2017-07-11 MED ORDER — ACYCLOVIR 400 MG PO TABS: 400.0000 mg | ORAL_TABLET | Freq: Two times a day (BID) | ORAL | Status: DC | PRN

## 2017-07-11 MED ORDER — PHENOL 1.4 % MT LIQD: 1.0000 | OROMUCOSAL | Status: DC | PRN

## 2017-07-11 MED ORDER — BUPIVACAINE HCL (PF) 0.5 % IJ SOLN
INTRAMUSCULAR | Status: DC | PRN
  Administered 2017-07-11: 2.5 mL

## 2017-07-11 MED ORDER — TETRACAINE HCL 1 % IJ SOLN
INTRAMUSCULAR | Status: DC | PRN
  Administered 2017-07-11: 5 mg via INTRASPINAL

## 2017-07-11 MED ORDER — NEOMYCIN-POLYMYXIN B GU 40-200000 IR SOLN
Status: DC | PRN
  Administered 2017-07-11: 12 mL

## 2017-07-11 MED ORDER — OXYCODONE HCL 5 MG PO TABS
5.0000 mg | ORAL_TABLET | ORAL | Status: DC | PRN
  Administered 2017-07-11 – 2017-07-12 (×2): 5 mg via ORAL
  Filled 2017-07-11 (×2): qty 1

## 2017-07-11 MED ORDER — MIDAZOLAM HCL 5 MG/5ML IJ SOLN
INTRAMUSCULAR | Status: DC | PRN
  Administered 2017-07-11: 2 mg via INTRAVENOUS

## 2017-07-11 MED ORDER — MAGNESIUM HYDROXIDE 400 MG/5ML PO SUSP
30.0000 mL | Freq: Every day | ORAL | Status: DC | PRN
  Administered 2017-07-12 – 2017-07-13 (×2): 30 mL via ORAL
  Filled 2017-07-11 (×2): qty 30

## 2017-07-11 MED ORDER — TRANEXAMIC ACID 1000 MG/10ML IV SOLN
1000.0000 mg | Freq: Once | INTRAVENOUS | Status: AC
  Administered 2017-07-11: 1000 mg via INTRAVENOUS
  Filled 2017-07-11: qty 10

## 2017-07-11 MED ORDER — MENTHOL 3 MG MT LOZG: 1.0000 | LOZENGE | OROMUCOSAL | Status: DC | PRN

## 2017-07-11 MED ORDER — LACTATED RINGERS IV SOLN
INTRAVENOUS | Status: DC
  Administered 2017-07-11 (×2): via INTRAVENOUS

## 2017-07-11 MED ORDER — PANTOPRAZOLE SODIUM 40 MG PO TBEC
40.0000 mg | DELAYED_RELEASE_TABLET | Freq: Two times a day (BID) | ORAL | Status: DC
  Administered 2017-07-11 – 2017-07-13 (×4): 40 mg via ORAL
  Filled 2017-07-11 (×4): qty 1

## 2017-07-11 MED ORDER — DEXTROSE 5 % IV SOLN
2.0000 g | Freq: Four times a day (QID) | INTRAVENOUS | Status: AC
  Administered 2017-07-12 (×3): 2 g via INTRAVENOUS
  Filled 2017-07-11 (×4): qty 20

## 2017-07-11 MED ORDER — FLEET ENEMA 7-19 GM/118ML RE ENEM: 1.0000 | ENEMA | Freq: Once | RECTAL | Status: DC | PRN

## 2017-07-11 MED ORDER — DIPHENHYDRAMINE HCL 12.5 MG/5ML PO ELIX: 12.5000 mg | ORAL_SOLUTION | ORAL | Status: DC | PRN

## 2017-07-11 MED ORDER — ONDANSETRON HCL 4 MG/2ML IJ SOLN
INTRAMUSCULAR | Status: DC | PRN
  Administered 2017-07-11: 4 mg via INTRAVENOUS

## 2017-07-11 MED ORDER — SODIUM CHLORIDE 0.9 % IV SOLN
INTRAVENOUS | Status: DC | PRN
  Administered 2017-07-11: 60 mL

## 2017-07-11 MED ORDER — SODIUM CHLORIDE 0.9 % IJ SOLN
INTRAMUSCULAR | Status: AC
  Filled 2017-07-11: qty 50

## 2017-07-11 MED ORDER — DEXTROSE 5 % IV SOLN
2.0000 g | Freq: Four times a day (QID) | INTRAVENOUS | Status: DC
  Administered 2017-07-11: 2 g via INTRAVENOUS
  Filled 2017-07-11 (×4): qty 20

## 2017-07-11 MED ORDER — SENNOSIDES-DOCUSATE SODIUM 8.6-50 MG PO TABS
1.0000 | ORAL_TABLET | Freq: Two times a day (BID) | ORAL | Status: DC
  Administered 2017-07-11 – 2017-07-13 (×4): 1 via ORAL
  Filled 2017-07-11 (×4): qty 1

## 2017-07-11 MED ORDER — OXYCODONE HCL 5 MG PO TABS
10.0000 mg | ORAL_TABLET | ORAL | Status: DC | PRN
  Administered 2017-07-11 – 2017-07-12 (×2): 10 mg via ORAL
  Filled 2017-07-11 (×3): qty 2

## 2017-07-11 MED ORDER — CHLORHEXIDINE GLUCONATE 4 % EX LIQD: 60.0000 mL | Freq: Once | CUTANEOUS | Status: DC

## 2017-07-11 MED ORDER — ONDANSETRON HCL 4 MG/2ML IJ SOLN
4.0000 mg | Freq: Once | INTRAMUSCULAR | Status: AC
  Administered 2017-07-11: 4 mg via INTRAVENOUS

## 2017-07-11 MED ORDER — VITAMIN D 1000 UNITS PO TABS
1000.0000 [IU] | ORAL_TABLET | Freq: Every day | ORAL | Status: DC
  Administered 2017-07-12 – 2017-07-13 (×2): 1000 [IU] via ORAL
  Filled 2017-07-11 (×2): qty 1

## 2017-07-11 MED ORDER — MORPHINE SULFATE (PF) 2 MG/ML IV SOLN: 2.0000 mg | INTRAVENOUS | Status: DC | PRN

## 2017-07-11 MED ORDER — BISACODYL 10 MG RE SUPP
10.0000 mg | Freq: Every day | RECTAL | Status: DC | PRN
  Administered 2017-07-13: 10 mg via RECTAL
  Filled 2017-07-11: qty 1

## 2017-07-11 MED ORDER — TRAMADOL HCL 50 MG PO TABS
50.0000 mg | ORAL_TABLET | ORAL | Status: DC | PRN
Start: 2017-07-11 — End: 2017-07-13
  Administered 2017-07-11: 50 mg via ORAL
  Administered 2017-07-12 (×2): 100 mg via ORAL
  Administered 2017-07-12: 50 mg via ORAL
  Administered 2017-07-12 – 2017-07-13 (×2): 100 mg via ORAL
  Filled 2017-07-11 (×3): qty 2
  Filled 2017-07-11 (×2): qty 1
  Filled 2017-07-11: qty 2

## 2017-07-11 MED ORDER — PHENYLEPHRINE HCL 10 MG/ML IJ SOLN
INTRAMUSCULAR | Status: DC | PRN
  Administered 2017-07-11: 30 ug/min via INTRAVENOUS

## 2017-07-11 SURGICAL SUPPLY — 65 items
BATTERY INSTRU NAVIGATION (MISCELLANEOUS) ×8 IMPLANT
BLADE SAW 1 (BLADE) ×2 IMPLANT
BLADE SAW 1/2 (BLADE) ×2 IMPLANT
BLADE SAW 70X12.5 (BLADE) IMPLANT
BONE CEMENT GENTAMICIN (Cement) ×4 IMPLANT
CANISTER SUCT 1200ML W/VALVE (MISCELLANEOUS) ×2 IMPLANT
CANISTER SUCT 3000ML PPV (MISCELLANEOUS) ×4 IMPLANT
CAPT KNEE TOTAL 3 ATTUNE ×2 IMPLANT
CEMENT BONE GENTAMICIN 40 (Cement) ×2 IMPLANT
COOLER POLAR GLACIER W/PUMP (MISCELLANEOUS) ×2 IMPLANT
CUFF TOURN 24 STER (MISCELLANEOUS) IMPLANT
CUFF TOURN 30 STER DUAL PORT (MISCELLANEOUS) IMPLANT
DRAPE SHEET LG 3/4 BI-LAMINATE (DRAPES) ×2 IMPLANT
DRSG DERMACEA 8X12 NADH (GAUZE/BANDAGES/DRESSINGS) ×2 IMPLANT
DRSG OPSITE POSTOP 4X14 (GAUZE/BANDAGES/DRESSINGS) ×2 IMPLANT
DRSG TEGADERM 4X4.75 (GAUZE/BANDAGES/DRESSINGS) ×2 IMPLANT
DURAPREP 26ML APPLICATOR (WOUND CARE) ×4 IMPLANT
ELECT CAUTERY BLADE 6.4 (BLADE) ×2 IMPLANT
ELECT REM PT RETURN 9FT ADLT (ELECTROSURGICAL) ×2
ELECTRODE REM PT RTRN 9FT ADLT (ELECTROSURGICAL) ×1 IMPLANT
EVACUATOR 1/8 PVC DRAIN (DRAIN) ×2 IMPLANT
EX-PIN ORTHOLOCK NAV 4X150 (PIN) ×4 IMPLANT
GLOVE BIOGEL M STRL SZ7.5 (GLOVE) ×4 IMPLANT
GLOVE BIOGEL PI IND STRL 9 (GLOVE) ×1 IMPLANT
GLOVE BIOGEL PI INDICATOR 9 (GLOVE) ×1
GLOVE INDICATOR 8.0 STRL GRN (GLOVE) ×2 IMPLANT
GLOVE SURG SYN 9.0  PF PI (GLOVE) ×1
GLOVE SURG SYN 9.0 PF PI (GLOVE) ×1 IMPLANT
GOWN STRL REUS W/ TWL LRG LVL3 (GOWN DISPOSABLE) ×2 IMPLANT
GOWN STRL REUS W/TWL 2XL LVL3 (GOWN DISPOSABLE) ×2 IMPLANT
GOWN STRL REUS W/TWL LRG LVL3 (GOWN DISPOSABLE) ×2
HOLDER FOLEY CATH W/STRAP (MISCELLANEOUS) ×2 IMPLANT
HOOD PEEL AWAY FLYTE STAYCOOL (MISCELLANEOUS) ×4 IMPLANT
KIT RM TURNOVER STRD PROC AR (KITS) ×2 IMPLANT
KNIFE SCULPS 14X20 (INSTRUMENTS) ×2 IMPLANT
LABEL OR SOLS (LABEL) ×2 IMPLANT
NDL SAFETY ECLIPSE 18X1.5 (NEEDLE) ×1 IMPLANT
NEEDLE HYPO 18GX1.5 SHARP (NEEDLE) ×1
NEEDLE SPNL 20GX3.5 QUINCKE YW (NEEDLE) ×4 IMPLANT
NS IRRIG 500ML POUR BTL (IV SOLUTION) ×2 IMPLANT
PACK TOTAL KNEE (MISCELLANEOUS) ×2 IMPLANT
PAD WRAPON POLAR KNEE (MISCELLANEOUS) ×1 IMPLANT
PIN DRILL QUICK PACK ×2 IMPLANT
PIN FIXATION 1/8DIA X 3INL (PIN) ×2 IMPLANT
PULSAVAC PLUS IRRIG FAN TIP (DISPOSABLE) ×2
SOL .9 NS 3000ML IRR  AL (IV SOLUTION) ×1
SOL .9 NS 3000ML IRR UROMATIC (IV SOLUTION) ×1 IMPLANT
SOL PREP PVP 2OZ (MISCELLANEOUS) ×2
SOLUTION PREP PVP 2OZ (MISCELLANEOUS) ×1 IMPLANT
SPONGE DRAIN TRACH 4X4 STRL 2S (GAUZE/BANDAGES/DRESSINGS) ×2 IMPLANT
STAPLER SKIN PROX 35W (STAPLE) ×2 IMPLANT
STOCKINETTE IMPERV 14X48 (MISCELLANEOUS) ×2 IMPLANT
STRAP TIBIA SHORT (MISCELLANEOUS) ×2 IMPLANT
SUCTION FRAZIER HANDLE 10FR (MISCELLANEOUS) ×2
SUCTION TUBE FRAZIER 10FR DISP (MISCELLANEOUS) ×2 IMPLANT
SUT VIC AB 0 CT1 36 (SUTURE) ×4 IMPLANT
SUT VIC AB 1 CT1 36 (SUTURE) ×6 IMPLANT
SUT VIC AB 2-0 CT2 27 (SUTURE) ×2 IMPLANT
SYR 20CC LL (SYRINGE) ×2 IMPLANT
SYR 30ML LL (SYRINGE) ×4 IMPLANT
TIP FAN IRRIG PULSAVAC PLUS (DISPOSABLE) ×1 IMPLANT
TOWEL OR 17X26 4PK STRL BLUE (TOWEL DISPOSABLE) ×2 IMPLANT
TOWER CARTRIDGE SMART MIX (DISPOSABLE) ×2 IMPLANT
TRAY FOLEY W/METER SILVER 16FR (SET/KITS/TRAYS/PACK) ×2 IMPLANT
WRAPON POLAR PAD KNEE (MISCELLANEOUS) ×2

## 2017-07-11 NOTE — Patient Instructions (Signed)
Follow up as needed

## 2017-07-11 NOTE — Discharge Instructions (Signed)
°  Instructions after Total Knee Replacement ° ° Kumari Sculley P. Lorilyn Laitinen, Jr., M.D.    ° Dept. of Orthopaedics & Sports Medicine ° [name] ° [address] ° [place], [place]  [address] ° Phone: [phone]   Fax: [phone] ° °  °DIET: °• Drink plenty of non-alcoholic fluids. °• Resume your normal diet. Include foods high in fiber. ° °ACTIVITY:  °• You may use crutches or a walker with weight-bearing as tolerated, unless instructed otherwise. °• You may be weaned off of the walker or crutches by your Physical Therapist.  °• Do NOT place pillows under the knee. Anything placed under the knee could limit your ability to straighten the knee.   °• Continue doing gentle exercises. Exercising will reduce the pain and swelling, increase motion, and prevent muscle weakness.   °• Please continue to use the TED compression stockings for 6 weeks. You may remove the stockings at night, but should reapply them in the morning. °• Do not drive or operate any equipment until instructed. ° °WOUND CARE:  °• Continue to use the PolarCare or ice packs periodically to reduce pain and swelling. °• You may bathe or shower after the staples are removed at the first office visit following surgery. ° °MEDICATIONS: °• You may resume your regular medications. °• Please take the pain medication as prescribed on the medication. °• Do not take pain medication on an empty stomach. °• You have been given a prescription for a blood thinner (Lovenox or Coumadin). Please take the medication as instructed. (NOTE: After completing a 2 week course of Lovenox, take one Enteric-coated aspirin once a day. This along with elevation will help reduce the possibility of phlebitis in your operated leg.) °• Do not drive or drink alcoholic beverages when taking pain medications. ° °CALL THE OFFICE FOR: °• Temperature above 101 degrees °• Excessive bleeding or drainage on the dressing. °• Excessive swelling, coldness, or paleness of the toes. °• Persistent  nausea and vomiting. ° °FOLLOW-UP:  °• You should have an appointment to return to the office in 10-14 days after surgery. °• Arrangements have been made for continuation of Physical Therapy (either home therapy or outpatient therapy). °  °

## 2017-07-11 NOTE — Anesthesia Procedure Notes (Signed)
Date/Time: 07/11/2017 11:55 AM Performed by: Nelda Marseille, CRNA Pre-anesthesia Checklist: Patient identified, Emergency Drugs available, Suction available, Patient being monitored and Timeout performed Oxygen Delivery Method: Simple face mask

## 2017-07-11 NOTE — OR Nursing (Signed)
Daughter Ashley's cell phone # [phone].

## 2017-07-11 NOTE — Anesthesia Preprocedure Evaluation (Signed)
Anesthesia Evaluation  Patient identified by MRN, date of birth, ID band Patient awake    Reviewed: Allergy & Precautions, H&P , NPO status , Patient's Chart, lab work & pertinent test results  History of Anesthesia Complications (+) PONV and history of anesthetic complications  Airway Mallampati: II  TM Distance: >3 FB Neck ROM: full    Dental  (+) Chipped, Poor Dentition   Pulmonary neg shortness of breath, sleep apnea ,           Cardiovascular Exercise Tolerance: Good hypertension, (-) angina(-) Past MI and (-) DOE      Neuro/Psych  Neuromuscular disease negative psych ROS   GI/Hepatic negative GI ROS, Neg liver ROS,   Endo/Other  negative endocrine ROS  Renal/GU      Musculoskeletal  (+) Arthritis ,   Abdominal   Peds  Hematology negative hematology ROS (+)   Anesthesia Other Findings Past Medical History: No date: Boils No date: Ear mass No date: Hemorrhoids No date: Herpes simplex virus infection No date: HSV infection No date: Lumbago with sciatica No date: OA (osteoarthritis) of knee No date: Obesity No date: Overactive bladder No date: PONV (postoperative nausea and vomiting) No date: RA (rheumatoid arthritis) ([facility]) No date: Sleep apnea No date: Urge and stress incontinence  Past Surgical History: 2011: CHOLECYSTECTOMY No date: FRACTURE SURGERY; Right     Comment:  as a child No date: SLEEVE GASTROPLASTY 2010: TUMOR REMOVAL; Right     Comment:  ear     Reproductive/Obstetrics negative OB ROS                             Anesthesia Physical Anesthesia Plan  ASA: III  Anesthesia Plan: Spinal   Post-op Pain Management:    Induction:   PONV Risk Score and Plan: Midazolam and Propofol infusion  Airway Management Planned: Natural Airway and Nasal Cannula  Additional Equipment:   Intra-op Plan:   Post-operative Plan:   Informed Consent: I have reviewed  the patients History and Physical, chart, labs and discussed the procedure including the risks, benefits and alternatives for the proposed anesthesia with the patient or authorized representative who has indicated his/her understanding and acceptance.   Dental Advisory Given  Plan Discussed with: Anesthesiologist, CRNA and Surgeon  Anesthesia Plan Comments: (Patient reports no bleeding problems and no anticoagulant use.  Plan for spinal with backup GA  Patient consented for risks of anesthesia including but not limited to:  - adverse reactions to medications - risk of bleeding, infection, nerve damage and headache - risk of failed spinal - damage to teeth, lips or other oral mucosa - sore throat or hoarseness - Damage to heart, brain, lungs or loss of life  Patient voiced understanding.)        Anesthesia Quick Evaluation

## 2017-07-11 NOTE — H&P (Signed)
The patient has been re-examined, and the chart reviewed, and there have been no interval changes to the documented history and physical.    The risks, benefits, and alternatives have been discussed at length. The patient expressed understanding of the risks benefits and agreed with plans for surgical intervention.  Erin Contreras, Jr. M.D.    

## 2017-07-11 NOTE — Transfer of Care (Signed)
Immediate Anesthesia Transfer of Care Note  Patient: Erin Contreras  Procedure(s) Performed: COMPUTER ASSISTED TOTAL KNEE ARTHROPLASTY (Right )  Patient Location: PACU  Anesthesia Type:Spinal  Level of Consciousness: sedated  Airway & Oxygen Therapy: Patient Spontanous Breathing and Patient connected to face mask oxygen  Post-op Assessment: Report given to RN and Post -op Vital signs reviewed and stable  Post vital signs: Reviewed and stable  Last Vitals:  Vitals:   07/11/17 1017  BP: (!) 118/52  Pulse: 66  Resp: 18  Temp: 36.8 C  SpO2: 100%    Last Pain:  Vitals:   07/11/17 1017  TempSrc: Oral         Complications: No apparent anesthesia complications

## 2017-07-11 NOTE — Anesthesia Post-op Follow-up Note (Signed)
Anesthesia QCDR form completed.        

## 2017-07-11 NOTE — Op Note (Signed)
OPERATIVE NOTE  DATE OF SURGERY:  07/11/2017  PATIENT NAME:  Erin Contreras   DOB: 1961/07/17  MRN: [id number]  PRE-OPERATIVE DIAGNOSIS: Degenerative arthrosis of the right knee, primary  POST-OPERATIVE DIAGNOSIS:  Same  PROCEDURE:  Right total knee arthroplasty using computer-assisted navigation  SURGEON:  Marciano Sequin. M.D.  ASSISTANT:  Vance Peper, PA (present and scrubbed throughout the case, critical for assistance with exposure, retraction, instrumentation, and closure)  ANESTHESIA: spinal  ESTIMATED BLOOD LOSS: 75 mL  FLUIDS REPLACED: 1600 mL of crystalloid  TOURNIQUET TIME: 97 minutes  DRAINS: 2 medium Hemovac drains  SOFT TISSUE RELEASES: Anterior cruciate ligament, posterior cruciate ligament, deep medial collateral ligament, patellofemoral ligament, posterolateral corner  IMPLANTS UTILIZED: DePuy Attune size 5 posterior stabilized femoral component (cemented), size 5 rotating platform tibial component (cemented), 38 mm medialized dome patella (cemented), and an 8 mm stabilized rotating platform polyethylene insert.  INDICATIONS FOR SURGERY: Erin Contreras is a 56 y.o. year old female with a long history of progressive knee pain. X-rays demonstrated severe degenerative changes in tricompartmental fashion. The patient had not seen any significant improvement despite conservative nonsurgical intervention. After discussion of the risks and benefits of surgical intervention, the patient expressed understanding of the risks benefits and agree with plans for total knee arthroplasty.   The risks, benefits, and alternatives were discussed at length including but not limited to the risks of infection, bleeding, nerve injury, stiffness, blood clots, the need for revision surgery, cardiopulmonary complications, among others, and they were willing to proceed.  PROCEDURE IN DETAIL: The patient was brought into the operating room and, after adequate spinal anesthesia was achieved,  a tourniquet was placed on the patient's upper thigh. The patient's knee and leg were cleaned and prepped with alcohol and DuraPrep and draped in the usual sterile fashion. A "timeout" was performed as per usual protocol. The lower extremity was exsanguinated using an Esmarch, and the tourniquet was inflated to 300 mmHg. An anterior longitudinal incision was made followed by a standard mid vastus approach. The deep fibers of the medial collateral ligament were elevated in a subperiosteal fashion off of the medial flare of the tibia so as to maintain a continuous soft tissue sleeve. The patella was subluxed laterally and the patellofemoral ligament was incised. Inspection of the knee demonstrated severe degenerative changes with full-thickness loss of articular cartilage. Osteophytes were debrided using a rongeur. Anterior and posterior cruciate ligaments were excised. Two 4.0 mm Schanz pins were inserted in the femur and into the tibia for attachment of the array of trackers used for computer-assisted navigation. Hip center was identified using a circumduction technique. Distal landmarks were mapped using the computer. The distal femur and proximal tibia were mapped using the computer. The distal femoral cutting guide was positioned using computer-assisted navigation so as to achieve a 5 distal valgus cut. The femur was sized and it was felt that a size 5 femoral component was appropriate. A size 5 femoral cutting guide was positioned and the anterior cut was performed and verified using the computer. This was followed by completion of the posterior and chamfer cuts. Femoral cutting guide for the central box was then positioned in the center box cut was performed.  Attention was then directed to the proximal tibia. Medial and lateral menisci were excised. The extramedullary tibial cutting guide was positioned using computer-assisted navigation so as to achieve a 0 varus-valgus alignment and 3 posterior slope.  The cut was performed and verified using the computer. The  proximal tibia was sized and it was felt that a size 5 tibial tray was appropriate. Tibial and femoral trials were inserted followed by insertion of a 5 mm polyethylene insert.  The knee was felt to be tight laterally.  The trial components were removed and the knee was brought into full extension and distended using the Moreland retractors.  The posterolateral corner was carefully released using combination of electrocautery and Metzenbaum scissors.  The trial components were reinserted followed by insertion of an 8 mm polyethylene trial.  This allowed for excellent mediolateral soft tissue balancing both in flexion and in full extension. Finally, the patella was cut and prepared so as to accommodate a 38 mm medialized dome patella. A patella trial was placed and the knee was placed through a range of motion with excellent patellar tracking appreciated. The femoral trial was removed after debridement of posterior osteophytes. The central post-hole for the tibial component was reamed followed by insertion of a keel punch. Tibial trials were then removed. Cut surfaces of bone were irrigated with copious amounts of normal saline with antibiotic solution using pulsatile lavage and then suctioned dry. Polymethylmethacrylate cement with gentamicin was prepared in the usual fashion using a vacuum mixer. Cement was applied to the cut surface of the proximal tibia as well as along the undersurface of a size 8 rotating platform tibial component. Tibial component was positioned and impacted into place. Excess cement was removed using [employer] fast streamer. Cement was then applied to the cut surfaces of the femur as well as along the posterior flanges of the size 5 femoral component. The femoral component was positioned and impacted into place. Excess cement was removed using [employer] fast streamer. An 8 mm polyethylene trial was inserted and the knee was brought into full  extension with steady axial compression applied. Finally, cement was applied to the backside of a 38 mm medialized dome patella and the patellar component was positioned and patellar clamp applied. Excess cement was removed using [employer] fast streamer. After adequate curing of the cement, the tourniquet was deflated after a total tourniquet time of 97 minutes. Hemostasis was achieved using electrocautery. The knee was irrigated with copious amounts of normal saline with antibiotic solution using pulsatile lavage and then suctioned dry. 20 mL of 1.3% Exparel and 60 mL of 0.25% Marcaine in 40 mL of normal saline was injected along the posterior capsule, medial and lateral gutters, and along the arthrotomy site. An 8 mm stabilized rotating platform polyethylene insert was inserted and the knee was placed through a range of motion with excellent mediolateral soft tissue balancing appreciated and excellent patellar tracking noted. 2 medium drains were placed in the wound bed and brought out through separate stab incisions. The medial parapatellar portion of the incision was reapproximated using interrupted sutures of #1 Vicryl. Subcutaneous tissue was approximated in layers using first #0 Vicryl followed #2-0 Vicryl. The skin was approximated with skin staples. A sterile dressing was applied.  The patient tolerated the procedure well and was transported to the recovery room in stable condition.    James P. Holley Bouche., M.D.

## 2017-07-11 NOTE — Anesthesia Procedure Notes (Addendum)
Spinal  Patient location during procedure: OR Start time: 07/11/2017 11:30 AM End time: 07/11/2017 11:37 AM Staffing Resident/CRNA: Nelda Marseille, CRNA Performed: resident/CRNA  Preanesthetic Checklist Completed: patient identified, site marked, surgical consent, pre-op evaluation, timeout performed, IV checked, risks and benefits discussed and monitors and equipment checked Spinal Block Patient position: sitting Prep: Betadine Patient monitoring: heart rate, continuous pulse ox, blood pressure and cardiac monitor Approach: midline Location: L4-5 Injection technique: single-shot Needle Needle type: Whitacre and Introducer  Needle gauge: 25 G Needle length: 9 cm Additional Notes Negative paresthesia. Negative blood return. Positive free-flowing CSF. Expiration date of kit checked and confirmed. Patient tolerated procedure well, without complications.

## 2017-07-12 MED ORDER — OXYCODONE HCL 5 MG PO TABS: 5.0000 mg | ORAL_TABLET | ORAL | 0 refills | Status: DC | PRN

## 2017-07-12 MED ORDER — ENOXAPARIN SODIUM 40 MG/0.4ML ~~LOC~~ SOLN: 40.0000 mg | SUBCUTANEOUS | 0 refills | Status: DC

## 2017-07-12 MED ORDER — TRAMADOL HCL 50 MG PO TABS: 50.0000 mg | ORAL_TABLET | ORAL | 0 refills | Status: DC | PRN

## 2017-07-12 NOTE — Progress Notes (Signed)
Clinical Social Worker (CSW) received SNF consult. PT is recommending home health. RN case manager aware of above. Please reconsult if future social work needs arise. CSW signing off.   Evetta Renner, LCSW [phone] 

## 2017-07-12 NOTE — Anesthesia Postprocedure Evaluation (Signed)
Anesthesia Post Note  Patient: Erin Contreras  Procedure(s) Performed: COMPUTER ASSISTED TOTAL KNEE ARTHROPLASTY (Right )  Patient location during evaluation: Nursing Unit Anesthesia Type: Spinal Level of consciousness: awake, awake and alert and oriented Pain management: pain level controlled Vital Signs Assessment: post-procedure vital signs reviewed and stable Respiratory status: spontaneous breathing, nonlabored ventilation and respiratory function stable Cardiovascular status: blood pressure returned to baseline and stable Postop Assessment: no headache and no backache Anesthetic complications: no     Last Vitals:  Vitals:   07/12/17 0359 07/12/17 0409  BP: (!) 91/59 115/70  Pulse: 73 62  Resp: 19   Temp: (!) 36.4 C   SpO2: 97%     Last Pain:  Vitals:   07/12/17 0646  TempSrc:   PainSc: 6                  [employer]

## 2017-07-12 NOTE — Progress Notes (Signed)
Physical Therapy Treatment Patient Details Name: Erin Contreras MRN: [id number] DOB: [birth year] Today's Date: 07/12/2017    History of Present Illness 56 y/o female s/p R TKA 12/10.    PT Comments    Pt agreeable to PT; reports 5/10 pain in R knee. Pt denies dizziness/low BP symptoms with avoidance of pain medications (only Advil/tylenol). Pt/family educated in stretching and strengthening exercises with written home program provided. Answered family questions regarding use of cold therapy to their satisfaction. Also educated pt/family in open versus closed joint position for swelling management. Pt progressing ambulation quality and distance and educated with good demonstration of stair climbing. Pt to continue PT to progress strength, endurance and range of motion of R knee to progress all functional mobility to allow for a safe discharge home.    Follow Up Recommendations  Home health PT     Equipment Recommendations  None recommended by PT    Recommendations for Other Services       Precautions / Restrictions Precautions Precautions: Knee Restrictions Weight Bearing Restrictions: Yes RLE Weight Bearing: Weight bearing as tolerated    Mobility  Bed Mobility               General bed mobility comments: Not tested; up in chair  Transfers Overall transfer level: Needs assistance Equipment used: Rolling walker (2 wheeled) Transfers: Sit to/from Stand Sit to Stand: Min guard         General transfer comment: cues for increased use of RLE with explanation of benefits of strengthening and stretching  Ambulation/Gait Ambulation/Gait assistance: Supervision Ambulation Distance (Feet): 190 Feet Assistive device: Rolling walker (2 wheeled) Gait Pattern/deviations: Step-through pattern;Step-to pattern;Decreased step length - left   Gait velocity interpretation: Below normal speed for age/gender General Gait Details: Initially step to with decreased step L;  instructed on reciprocal pattern and benefits. Pt demonstrates good undestanding and correction. Improved fluidity throughout session   Stairs Stairs: Yes   Stair Management: One rail Right;Step to pattern;Sideways Number of Stairs: 4 General stair comments: Instructed sequence and technique with good understanding and follow through.   Wheelchair Mobility    Modified Rankin (Stroke Patients Only)       Balance Overall balance assessment: Modified Independent                                          Cognition Arousal/Alertness: Awake/alert Behavior During Therapy: WFL for tasks assessed/performed Overall Cognitive Status: Within Functional Limits for tasks assessed                                        Exercises Total Joint Exercises Quad Sets: Strengthening;Both;10 reps;Standing(and long sit) Knee Flexion: AROM;Right;10 reps;Seated(4 positions each rep with 10 sec hold each)    General Comments        Pertinent Vitals/Pain Pain Assessment: 0-10 Pain Score: 5  Pain Location: R knee Pain Intervention(s): Monitored during session;Ice applied;Premedicated before session    Home Living                      Prior Function            PT Goals (current goals can now be found in the care plan section) Progress towards PT goals: Progressing toward goals    Frequency  BID      PT Plan Current plan remains appropriate    Co-evaluation              AM-PAC PT "6 Clicks" Daily Activity  Outcome Measure  Difficulty turning over in bed (including adjusting bedclothes, sheets and blankets)?: None Difficulty moving from lying on back to sitting on the side of the bed? : None Difficulty sitting down on and standing up from a chair with arms (e.g., wheelchair, bedside commode, etc,.)?: A Little Help needed moving to and from a bed to chair (including a wheelchair)?: None Help needed walking in hospital room?: A  Little Help needed climbing 3-5 steps with a railing? : A Little 6 Click Score: 21    End of Session   Activity Tolerance: Patient tolerated treatment well Patient left: in chair;with call bell/phone within reach;Other (comment)(polar care in place; declines alarm)   PT Visit Diagnosis: Muscle weakness (generalized) (M62.81);Difficulty in walking, not elsewhere classified (R26.2)     Time: 9244-6286 PT Time Calculation (min) (ACUTE ONLY): 38 min  Charges:  $Gait Training: 23-37 mins $Therapeutic Exercise: 8-22 mins                    G Codes:        Larae Grooms, PTA 07/12/2017, 4:06 PM

## 2017-07-12 NOTE — Progress Notes (Signed)
Patient requested pain medication, BP taken to reveal 94/56, will hold narcotic pain med at this time.

## 2017-07-12 NOTE — Progress Notes (Signed)
   Subjective: 1 Day Post-Op Procedure(s) (LRB): COMPUTER ASSISTED TOTAL KNEE ARTHROPLASTY (Right) Patient reports pain as 6 on 0-10 scale.   Patient is well, and has had no acute complaints or problems We will start therapy today.  Plan is to go Home after hospital stay. no nausea and no vomiting Patient denies any chest pains or shortness of breath. Objective: Vital signs in last 24 hours: Temp:  [97.1 F (36.2 C)-98.7 F (37.1 C)] 98.2 F (36.8 C) (12/11 0744) Pulse Rate:  [56-79] 67 (12/11 0744) Resp:  [11-20] 19 (12/11 0359) BP: (91-122)/(52-84) 101/67 (12/11 0744) SpO2:  [94 %-100 %] 96 % (12/11 0744) Weight:  [117.5 kg (259 lb)] 117.5 kg (259 lb) (12/10 1741) Heels are non tender and elevated off the bed using rolled towels as well as bone foam under operative leg  Intake/Output from previous day: 12/10 0701 - 12/11 0700 In: 4101.7 [P.O.:360; I.V.:3051.7; IV Piggyback:690] Out: 3185 [Urine:2850; Drains:260; Blood:75] Intake/Output this shift: Total I/O In: 285 [I.V.:285] Out: -   No results for input(s): HGB in the last 72 hours. No results for input(s): WBC, RBC, HCT, PLT in the last 72 hours. No results for input(s): NA, K, CL, CO2, BUN, CREATININE, GLUCOSE, CALCIUM in the last 72 hours. No results for input(s): LABPT, INR in the last 72 hours.  EXAM General - Patient is Alert, Appropriate and Oriented Extremity - Neurologically intact Neurovascular intact Sensation intact distally Intact pulses distally Dorsiflexion/Plantar flexion intact Compartment soft Dressing - dressing C/D/I Motor Function - intact, moving foot and toes well on exaable to do a partial straight leg raise on her own Past Medical History:  Diagnosis Date  . Boils   . Ear mass   . Hemorrhoids   . Herpes simplex virus infection   . HSV infection   . Lumbago with sciatica   . OA (osteoarthritis) of knee   . Obesity   . Overactive bladder   . PONV (postoperative nausea and vomiting)    . RA (rheumatoid arthritis) ([facility])   . Sleep apnea   . Urge and stress incontinence     Assessment/Plan: 1 Day Post-Op Procedure(s) (LRB): COMPUTER ASSISTED TOTAL KNEE ARTHROPLASTY (Right) Active Problems:   S/P total knee arthroplasty  Estimated body mass index is 38.25 kg/m as calculated from the following:   Height as of this encounter: 5\' 9"  (1.753 m).   Weight as of this encounter: 117.5 kg (259 lb). Advance diet Up with therapy D/C IV fluids Plan for discharge tomorrow Discharge home with home health  Labs: Were reviewed and acceptable  Prophylaxis - Lovenox, Foot Pumps and TED hose Weight-Bearing As tolerated to the right lower extremity D/C O2 and Pulse OX and try on Room Air  begin working on bowel movement  Cleatus Gabriel R. [place] 07/12/2017, 9:48 AM

## 2017-07-12 NOTE — Care Management (Addendum)
Prior authorization has been received  From [employer].. [facility] pharmacy notified. Copay is $4. Patient updated.  Case number for approved prior authorization [id number]

## 2017-07-12 NOTE — Evaluation (Signed)
Occupational Therapy Evaluation Patient Details Name: Erin Contreras MRN: [id number] DOB: 1960-08-20 Today's Date: 07/12/2017    History of Present Illness 56 y/o female s/p R TKA 12/10.   Clinical Impression   Pt seen for OT evaluation this date, POD#1 from R TKA. Pt presents with R knee pain, decreased ROM/strength in R knee, and decreased knowledge of AE/DME for ADL tasks. Pt educated in polar care mgt, compression stocking mgt, AE/DME for bathing, dressing, and toileting needs. Pt would benefit from [facility] for use in home to better manage bladder needs over night and during the day in order to minimize falls risk. Pt will benefit from skilled OT services while in the hospital to maximize safety and functional independence and return to PLOF. No skilled OT needs anticipated after hospitalization.     Follow Up Recommendations  No OT follow up    Equipment Recommendations  3 in 1 bedside commode    Recommendations for Other Services       Precautions / Restrictions Precautions Precautions: Knee Restrictions Weight Bearing Restrictions: Yes RLE Weight Bearing: Weight bearing as tolerated      Mobility Bed Mobility             General bed mobility comments: deferred, pt up in recliner  Transfers Overall transfer level: Modified independent Equipment used: Rolling walker (2 wheeled)                 Balance Overall balance assessment: Modified Independent                                         ADL either performed or assessed with clinical judgement   ADL Overall ADL's : Needs assistance/impaired             Lower Body Bathing: Minimal assistance;Sit to/from stand Lower Body Bathing Details (indicate cue type and reason): pt educated in seated shower using BSC in tub, once cleared by surgeon, to maximize safety/independence     Lower Body Dressing: Minimal assistance;Sit to/from stand Lower Body Dressing Details (indicate cue type  and reason): pt educated in AE for LB dressing to maximize safety/independence Toilet Transfer: Min guard;RW;Ambulation;Comfort height toilet Toilet Transfer Details (indicate cue type and reason): pt educated in use of BSC beside bed overnight to better more safely manage bladder, over toilet at home during the day, and in shower                  Vision Baseline Vision/History: Wears glasses Wears Glasses: Reading only Patient Visual Report: No change from baseline Vision Assessment?: No apparent visual deficits     Perception     Praxis      Pertinent Vitals/Pain Pain Assessment: 0-10 Pain Score: 6  Pain Location: R knee Pain Descriptors / Indicators: Aching Pain Intervention(s): Limited activity within patient's tolerance;Monitored during session;Premedicated before session;Ice applied;Patient requesting pain meds-RN notified     Hand Dominance     Extremity/Trunk Assessment Upper Extremity Assessment Upper Extremity Assessment: Overall WFL for tasks assessed   Lower Extremity Assessment Lower Extremity Assessment: Defer to PT evaluation;Overall [facility] for tasks assessed   Cervical / Trunk Assessment Cervical / Trunk Assessment: Normal   Communication Communication Communication: No difficulties   Cognition Arousal/Alertness: Awake/alert Behavior During Therapy: WFL for tasks assessed/performed Overall Cognitive Status: Within Functional Limits for tasks assessed  General Comments       Exercises Other Exercises Other Exercises: pt educated in polar care and compression stocking mgt Other Exercises: pt educated in functional mobility training RW in kitchen environment, to maximize safety/independence   Shoulder Instructions      Home Living Family/patient expects to be discharged to:: Private residence Living Arrangements: Children(daughter) Available Help at Discharge: Family;Available 24  hours/day(daughter took a week off to help pt, works nightshift otherwise)   Home Access: Stairs to enter Technical brewer of Steps: 3 Entrance Stairs-Rails: Left;Right(can't reach both) Home Layout: One level     Bathroom Shower/Tub: Teacher, early years/pre: [place]: Environmental consultant - 2 wheels          Prior Functioning/Environment Level of Independence: Independent        Comments: Pt has not needed an AD, but has been limping more and more on the R.  Generally able to be active. no falls in past 12 months. Drives, works (extended periods of standing and walking)        OT Problem List: Decreased strength;Pain;Decreased knowledge of use of DME or AE      OT Treatment/Interventions: Self-care/ADL training;Therapeutic exercise;Therapeutic activities;DME and/or AE instruction;Patient/family education    OT Goals(Current goals can be found in the care plan section) Acute Rehab OT Goals Patient Stated Goal: Go home OT Goal Formulation: With patient Time For Goal Achievement: 07/19/17 Potential to Achieve Goals: Good ADL Goals Pt Will Perform Lower Body Dressing: with modified independence;sit to/from stand;with adaptive equipment Pt Will Transfer to Toilet: with modified independence;ambulating(BSC over toilet, RW for ambulation)  OT Frequency: Min 1X/week   Barriers to D/C:            Co-evaluation              AM-PAC PT "6 Clicks" Daily Activity     Outcome Measure Help from another person eating meals?: None Help from another person taking care of personal grooming?: None Help from another person toileting, which includes using toliet, bedpan, or urinal?: None Help from another person bathing (including washing, rinsing, drying)?: A Little Help from another person to put on and taking off regular upper body clothing?: None Help from another person to put on and taking off regular lower body clothing?: A Little 6 Click Score:  22   End of Session Nurse Communication: (pt verbalized plan to notify nursing after session of request for pain medication)  Activity Tolerance: Patient tolerated treatment well Patient left: in chair;with call bell/phone within reach;with chair alarm set;Other (comment)(polar care in place)  OT Visit Diagnosis: Other abnormalities of gait and mobility (R26.89);Pain Pain - Right/Left: Right Pain - part of body: Knee                Time: 1125-1141 OT Time Calculation (min): 16 min Charges:  OT General Charges $OT Visit: 1 Visit OT Evaluation $OT Eval Low Complexity: 1 Low OT Treatments $Self Care/Home Management : 8-22 mins G-Codes: OT G-codes **NOT FOR INPATIENT CLASS** Functional Assessment Tool Used: AM-PAC 6 Clicks Daily Activity;Clinical judgement Functional Limitation: Self care Self Care Current Status (Z6109): At least 1 percent but less than 20 percent impaired, limited or restricted Self Care Goal Status (U0454): At least 1 percent but less than 20 percent impaired, limited or restricted   Jeni Salles, MPH, MS, OTR/L ascom [phone] 07/12/17, 11:56 AM

## 2017-07-12 NOTE — Discharge Summary (Signed)
Physician Discharge Summary  Patient ID: Erin Contreras MRN: [id number] DOB/AGE: 1960-12-19 56 y.o.  Admit date: 07/11/2017 Discharge date: 07/13/2017  Admission Diagnoses:  PRIMARY OSTEOARTHRITIS OF RIGHT KNEE   Discharge Diagnoses: Patient Active Problem List   Diagnosis Date Noted  . Benign hypertension 07/11/2017  . S/P total knee arthroplasty 07/11/2017  . Obstructive sleep apnea of adult 04/27/2017  . Multiple nodules of lung 04/27/2017  . Herpes simplex virus infection 03/17/2017  . Piriformis syndrome of right side 12/23/2015  . Knee pain, right 12/02/2015  . Pre-ulcerative corn or callous 12/02/2015  . OA (osteoarthritis) of knee   . RA (rheumatoid arthritis) ([place])   . Obesity   . Urge and stress incontinence   . Lumbago with sciatica   . Gastroesophageal reflux disease with esophagitis 05/24/2014  . Rectal bleeding 01/17/2013    Past Medical History:  Diagnosis Date  . Boils   . Ear mass   . Hemorrhoids   . Herpes simplex virus infection   . HSV infection   . Lumbago with sciatica   . OA (osteoarthritis) of knee   . Obesity   . Overactive bladder   . PONV (postoperative nausea and vomiting)   . RA (rheumatoid arthritis) ([place])   . Sleep apnea   . Urge and stress incontinence      Transfusion: No transfusions during this admission   Consultants (if any):   Discharged Condition: Improved  Hospital Course: Erin Contreras is an 56 y.o. female who was admitted 07/11/2017 with a diagnosis of degenerative arthrosis right knee and went to the operating room on 07/11/2017 and underwent the above named procedures.    Surgeries:Procedure(s): COMPUTER ASSISTED TOTAL KNEE ARTHROPLASTY on 07/11/2017   PRE-OPERATIVE DIAGNOSIS: Degenerative arthrosis of the right knee, primary  POST-OPERATIVE DIAGNOSIS:  Same  PROCEDURE:  Right total knee arthroplasty using computer-assisted navigation  SURGEON:  Marciano Sequin. M.D.  ASSISTANT:  Vance Peper, PA  (present and scrubbed throughout the case, critical for assistance with exposure, retraction, instrumentation, and closure)  ANESTHESIA: spinal  ESTIMATED BLOOD LOSS: 75 mL  FLUIDS REPLACED: 1600 mL of crystalloid  TOURNIQUET TIME: 97 minutes  DRAINS: 2 medium Hemovac drains  SOFT TISSUE RELEASES: Anterior cruciate ligament, posterior cruciate ligament, deep medial collateral ligament, patellofemoral ligament, posterolateral corner  IMPLANTS UTILIZED: DePuy Attune size 5 posterior stabilized femoral component (cemented), size 5 rotating platform tibial component (cemented), 38 mm medialized dome patella (cemented), and an 8 mm stabilized rotating platform polyethylene insert.  INDICATIONS FOR SURGERY: Erin Contreras is a 56 y.o. year old female with a long history of progressive knee pain. X-rays demonstrated severe degenerative changes in tricompartmental fashion. The patient had not seen any significant improvement despite conservative nonsurgical intervention. After discussion of the risks and benefits of surgical intervention, the patient expressed understanding of the risks benefits and agree with plans for total knee arthroplasty.    The risks, benefits, and alternatives were discussed at length including but not limited to the risks of infection, bleeding, nerve injury, stiffness, blood clots, the need for revision surgery, cardiopulmonary complications, among others, and they were willing to proceed.  Patient tolerated the surgery well. No complications .Patient was taken to PACU where she was stabilized and then transferred to the orthopedic floor.  Patient started on Lovenox 30 mg q 12 hrs. Foot pumps applied bilaterally at 80 mm hgb. Heels elevated off bed with rolled towels. No evidence of DVT. Calves non tender. Negative Homan. Physical therapy started  on day #1 for gait training and transfer with OT starting on  day #1 for ADL and assisted devices. Patient has done well  with therapy. Ambulated greater than 200 feet upon being discharged. Was able to ascend and descend 4 steps safely and independently  Patient's IV And Foley were discontinued on day #1 with Hemovac being discontinued on day #2. Dressing was changed on day 2 prior to patient being discharged   She was given perioperative antibiotics:  Anti-infectives (From admission, onward)   Start     Dose/Rate Route Frequency Ordered Stop   07/11/17 1815  ceFAZolin (ANCEF) 2 g in dextrose 5 % 100 mL IVPB     2 g 240 mL/hr over 30 Minutes Intravenous Every 6 hours 07/11/17 1800 07/12/17 1759   07/11/17 1700  ceFAZolin (ANCEF) 2 g in dextrose 5 % 50 mL IVPB  Status:  Discontinued     2 g 140 mL/hr over 30 Minutes Intravenous Every 6 hours 07/11/17 1649 07/11/17 1800   07/11/17 1649  acyclovir (ZOVIRAX) tablet 400 mg     400 mg Oral 2 times daily PRN 07/11/17 1649     07/11/17 0948  ceFAZolin (ANCEF) 2-4 GM/100ML-% IVPB    Comments:  Slemenda, Debra   : cabinet override      07/11/17 0948 07/11/17 1143   07/11/17 0600  ceFAZolin (ANCEF) IVPB 2g/100 mL premix     2 g 200 mL/hr over 30 Minutes Intravenous On call to O.R. 07/10/17 2205 07/11/17 1153    .  She was fitted with AV 1 compression foot pump devices, instructed on heel pumps, early ambulation, and fitted with TED stockings bilaterally for DVT prophylaxis.  She benefited maximally from the hospital stay and there were no complications.    Recent vital signs:  Vitals:   07/12/17 0409 07/12/17 0744  BP: 115/70 101/67  Pulse: 62 67  Resp:    Temp:  98.2 F (36.8 C)  SpO2:  96%    Recent laboratory studies:  Lab Results  Component Value Date   HGB 13.6 06/29/2017   HGB 12.3 09/04/2014   HGB 13.2 05/10/2014   Lab Results  Component Value Date   WBC 5.4 06/29/2017   PLT 240 06/29/2017   Lab Results  Component Value Date   INR 0.90 06/29/2017   Lab Results  Component Value Date   NA 139 06/29/2017   K 4.0 06/29/2017   CL 104  06/29/2017   CO2 25 06/29/2017   BUN 18 06/29/2017   CREATININE 0.57 06/29/2017   GLUCOSE 90 06/29/2017    Discharge Medications:   Allergies as of 07/12/2017   No Known Allergies     Medication List    TAKE these medications   acyclovir 400 MG tablet Commonly known as:  ZOVIRAX Take 1 tablet (400 mg total) by mouth 2 (two) times daily as needed. What changed:  reasons to take this   enoxaparin 40 MG/0.4ML injection Commonly known as:  LOVENOX Inject 0.4 mLs (40 mg total) into the skin daily for 14 days. Start taking on:  07/14/2017   ibuprofen 800 MG tablet Commonly known as:  ADVIL,MOTRIN Take 800 mg by mouth every 6 (six) hours as needed for headache or moderate pain.   nystatin powder Commonly known as:  MYCOSTATIN/NYSTOP Apply topically 4 (four) times daily.   oxyCODONE 5 MG immediate release tablet Commonly known as:  Oxy IR/ROXICODONE Take 1 tablet (5 mg total) by mouth every 4 (four) hours as  needed for moderate pain ((score 4 to 6)).   traMADol 50 MG tablet Commonly known as:  ULTRAM Take 1-2 tablets (50-100 mg total) by mouth every 4 (four) hours as needed for moderate pain.   VITAMIN D3 PO Take 1 capsule by mouth daily.            Durable Medical Equipment  (From admission, onward)        Start     Ordered   07/11/17 1845  For home use only DME continuous positive airway pressure (CPAP)  Once    Comments:  At bedtime if needed  Question Answer Comment  Patient has OSA or probable OSA No   Is the patient currently using CPAP in the home No   Settings Other see comments   CPAP supplies needed Mask, headgear, cushions, filters, heated tubing and water chamber      07/11/17 1845   07/11/17 1650  DME Walker rolling  Once    Question:  Patient needs a walker to treat with the following condition  Answer:  Total knee replacement status   07/11/17 1649   07/11/17 1650  DME Bedside commode  Once    Question:  Patient needs a bedside commode to  treat with the following condition  Answer:  Total knee replacement status   07/11/17 1649      Diagnostic Studies: Dg Knee Right Port  Result Date: 07/11/2017 CLINICAL DATA:  Status post total knee replacement. EXAM: PORTABLE RIGHT KNEE - 1-2 VIEW COMPARISON:  03/14/2017 FINDINGS: Two views study shows patient to be status post tricompartmental knee replacement. 2 surgical drains overlie the anterior soft tissues. No evidence for immediate hardware complications. IMPRESSION: Status post tricompartmental knee replacement without evidence for immediate hardware complications. Electronically Signed   By: Misty Stanley M.D.   On: 07/11/2017 15:43    Disposition: 01-Home or Self Care  Discharge Instructions    Diet - low sodium heart healthy   Complete by:  As directed    Increase activity slowly   Complete by:  As directed       Follow-up Information    Lattie Corns, PA-C On 07/27/2017.   Specialty:  Physician Assistant Why:  at 9:00am Contact information: [place] [address] [phone]        Dereck Leep, MD On 08/23/2017.   Specialty:  Orthopedic Surgery Why:  at 10:00am Contact information: [place] [address] [phone]            Signed: Watt Climes. 07/12/2017, 10:29 AM

## 2017-07-12 NOTE — Evaluation (Signed)
Physical Therapy Evaluation Patient Details Name: Erin Contreras MRN: [id number] DOB: 1960/12/23 Today's Date: 07/12/2017   History of Present Illness  56 y/o female s/p R TKA 12/10.  Clinical Impression  Pt very pleasant and willing to work hard with PT .  She did very well for first post-op session with ROM 0-87, ambulation of ~60 ft with walker (with consistent forward motion and good WBing tolerance on the R with the effort).  She was able to do 10 SLRs with very little warm up and was not overly limited with pain.  Pt did well and eager to continue working hard to get home.  Daughter will be off work the first week to assist at home.     Follow Up Recommendations Home health PT    Equipment Recommendations  None recommended by PT    Recommendations for Other Services       Precautions / Restrictions Precautions Precautions: Knee Restrictions Weight Bearing Restrictions: Yes RLE Weight Bearing: Weight bearing as tolerated      Mobility  Bed Mobility Overal bed mobility: Modified Independent             General bed mobility comments: Pt is able to rise to EOB w/o issue  Transfers Overall transfer level: Modified independent Equipment used: Rolling walker (2 wheeled)             General transfer comment: Pt is able to rise to standing with only light cuing for positioning, sequencing.  Ambulation/Gait Ambulation/Gait assistance: Supervision Ambulation Distance (Feet): 60 Feet Assistive device: Rolling walker (2 wheeled)       General Gait Details: Pt initially with some hesitancy, but ultimately was able to take more consistent and confident cadence with increased distance.  By the end she was able to maintain consistent forward motion and mobility.  Stairs            Wheelchair Mobility    Modified Rankin (Stroke Patients Only)       Balance Overall balance assessment: Modified Independent                                            Pertinent Vitals/Pain Pain Assessment: 0-10 Pain Score: 6     Home Living Family/patient expects to be discharged to:: Private residence Living Arrangements: Children     Home Access: Stairs to enter Entrance Stairs-Rails: Left;Right(cannot reach both) Technical brewer of Steps: 3   Home Equipment: Walker - 2 wheels      Prior Function Level of Independence: Independent         Comments: Pt has not needed an AD, but has been limping more and more on the R.  Generally able to be active     Hand Dominance        Extremity/Trunk Assessment   Upper Extremity Assessment Upper Extremity Assessment: Overall WFL for tasks assessed    Lower Extremity Assessment Lower Extremity Assessment: Overall WFL for tasks assessed(except expected post-op weakness in R LE)       Communication   Communication: No difficulties  Cognition Arousal/Alertness: Awake/alert Behavior During Therapy: WFL for tasks assessed/performed Overall Cognitive Status: Within Functional Limits for tasks assessed  General Comments      Exercises Total Joint Exercises Ankle Circles/Pumps: Strengthening;15 reps Quad Sets: Strengthening;10 reps Gluteal Sets: Strengthening;10 reps Heel Slides: Strengthening;10 reps Hip ABduction/ADduction: Strengthening;10 reps Straight Leg Raises: AROM;10 reps Knee Flexion: PROM;5 reps Goniometric ROM: 0-87   Assessment/Plan    PT Assessment Patient needs continued PT services  PT Problem List Decreased strength;Decreased range of motion;Decreased activity tolerance;Decreased balance;Decreased mobility;Decreased safety awareness;Decreased knowledge of use of DME;Pain       PT Treatment Interventions DME instruction;Gait training;Stair training;Functional mobility training;Therapeutic activities;Therapeutic exercise;Balance training;Neuromuscular re-education;Patient/family education     PT Goals (Current goals can be found in the Care Plan section)  Acute Rehab PT Goals Patient Stated Goal: Go home PT Goal Formulation: With patient Time For Goal Achievement: 07/26/17 Potential to Achieve Goals: Good    Frequency BID   Barriers to discharge        Co-evaluation               AM-PAC PT "6 Clicks" Daily Activity  Outcome Measure Difficulty turning over in bed (including adjusting bedclothes, sheets and blankets)?: None Difficulty moving from lying on back to sitting on the side of the bed? : None Difficulty sitting down on and standing up from a chair with arms (e.g., wheelchair, bedside commode, etc,.)?: A Little Help needed moving to and from a bed to chair (including a wheelchair)?: None Help needed walking in hospital room?: A Little Help needed climbing 3-5 steps with a railing? : A Little 6 Click Score: 21    End of Session Equipment Utilized During Treatment: Gait belt Activity Tolerance: Patient tolerated treatment well Patient left: with chair alarm set;with call bell/phone within reach Nurse Communication: Mobility status PT Visit Diagnosis: Muscle weakness (generalized) (M62.81);Difficulty in walking, not elsewhere classified (R26.2)    Time: 6789-3810 PT Time Calculation (min) (ACUTE ONLY): 35 min   Charges:   PT Evaluation $PT Eval Low Complexity: 1 Low PT Treatments $Therapeutic Exercise: 8-22 mins   PT G Codes:   PT G-Codes **NOT FOR INPATIENT CLASS** Functional Assessment Tool Used: AM-PAC 6 Clicks Basic Mobility Functional Limitation: Mobility: Walking and moving around Mobility: Walking and Moving Around Current Status (F7510): At least 20 percent but less than 40 percent impaired, limited or restricted Mobility: Walking and Moving Around Goal Status [phone]): At least 1 percent but less than 20 percent impaired, limited or restricted    Kreg Shropshire, DPT 07/12/2017, 11:30 AM

## 2017-07-12 NOTE — Care Management Note (Signed)
Case Management Note  Patient Details  Name: Erin Contreras MRN: [id number] Date of Birth: [birth year]  Subjective/Objective:  Met with patient to discuss discharge planning. Patient lives with her daughter. She has taken a week off of work to care for her mom. Offered home health agencies. Referral to [facility] for HHPT. Ordered a bedside commode from [facility]. Pharmacy: [facility] [name] [phone] Will check price of Lovenox prior to discharge tomorrow.                   Action/Plan: [facility] for HHPT, Ordered bsc frm [facility]. Lovenox prior to DC.    Expected Discharge Date:                  Expected Discharge Plan:  [place]  In-House Referral:     Discharge planning Services  CM Consult  Post Acute Care Choice:  Durable Medical Equipment, Home Health Choice offered to:  Patient  DME Arranged:  Bedside commode DME Agency:  [place]:  PT [place]:  [facility] ([facility]  Status of Service:  In process, will continue to follow  If discussed at Long Length of Stay Meetings, dates discussed:    Additional Comments:  Jolly Mango, RN 07/12/2017, 11:59 AM

## 2017-07-12 NOTE — Care Management (Signed)
Faxed [facility] [phone]) RX prior authorization form to [phone]

## 2017-07-13 ENCOUNTER — Encounter: Payer: Self-pay | Admitting: Orthopedic Surgery

## 2017-07-13 NOTE — Progress Notes (Signed)
Discharge note;  Discharge instructions and prescriptions given to pt. No questions from pt. BSC in room for pt to take home. IV removed. Tech helping pt get dressed. Pt's ride to arrive shortly.

## 2017-07-13 NOTE — Progress Notes (Signed)
Physical Therapy Treatment Patient Details Name: Erin Contreras MRN: [id number] DOB: Jul 29, 1961 Today's Date: 07/13/2017    History of Present Illness 56 y/o female s/p R TKA 12/10.    PT Comments    Pt agreeable to PT; reports 5/10 R knee pain. BP checked and remains mildly low; however, asymptomatic. Pt re educated in stretching/strengthening with QS and active range of motion flexion with improving range. Educated for carryover to home. Pt has questions regarding duration for use of rolling walker. Encouraged rolling walker use at this time to allow for increased strength, decreased pain and consistent, fluid gait before disuse. Educated on benefits to avoid gait deviations and for safety. Also advised to allow home therapist to assess walking and progress as appropriate. Pt ambulates initially with step to gait pattern improving with continued practice and cues for equal step lengths. Pt prepared to discharge home today to [place].    Follow Up Recommendations        Equipment Recommendations       Recommendations for Other Services       Precautions / Restrictions Precautions Precautions: Knee Restrictions Weight Bearing Restrictions: Yes RLE Weight Bearing: Weight bearing as tolerated    Mobility  Bed Mobility Overal bed mobility: Modified Independent                Transfers Overall transfer level: Modified independent               General transfer comment: Mild increased time; use of device  Ambulation/Gait Ambulation/Gait assistance: Supervision Ambulation Distance (Feet): 100 Feet Assistive device: Rolling walker (2 wheeled) Gait Pattern/deviations: Step-to pattern;Step-through pattern   Gait velocity interpretation: Below normal speed for age/gender General Gait Details: Initially step to with correction/improvement to step through with cues and continued practice on stride   Stairs            Wheelchair Mobility    Modified Rankin  (Stroke Patients Only)       Balance Overall balance assessment: Modified Independent                                          Cognition Arousal/Alertness: Awake/alert Behavior During Therapy: WFL for tasks assessed/performed Overall Cognitive Status: Within Functional Limits for tasks assessed                                        Exercises Total Joint Exercises Quad Sets: Strengthening;Right;20 reps Knee Flexion: AROM;Right;10 reps;Seated(4 positions each rep with 10 sec hold each) Goniometric ROM: 0-91    General Comments        Pertinent Vitals/Pain Pain Assessment: 0-10 Pain Score: 5  Pain Location: R knee Pain Intervention(s): Monitored during session;Ice applied;Premedicated before session    Home Living                      Prior Function            PT Goals (current goals can now be found in the care plan section) Progress towards PT goals: Progressing toward goals    Frequency    BID      PT Plan Current plan remains appropriate    Co-evaluation              AM-PAC PT "6 Clicks"  Daily Activity  Outcome Measure  Difficulty turning over in bed (including adjusting bedclothes, sheets and blankets)?: None Difficulty moving from lying on back to sitting on the side of the bed? : A Little Difficulty sitting down on and standing up from a chair with arms (e.g., wheelchair, bedside commode, etc,.)?: A Little Help needed moving to and from a bed to chair (including a wheelchair)?: None Help needed walking in hospital room?: A Little Help needed climbing 3-5 steps with a railing? : A Little 6 Click Score: 20    End of Session   Activity Tolerance: Patient tolerated treatment well Patient left: in bed;with call bell/phone within reach;Other (comment)(polar care in place)   PT Visit Diagnosis: Muscle weakness (generalized) (M62.81);Difficulty in walking, not elsewhere classified (R26.2)     Time:  1324-4010 PT Time Calculation (min) (ACUTE ONLY): 27 min  Charges:  $Gait Training: 8-22 mins $Therapeutic Exercise: 8-22 mins                    G Codes:  Functional Assessment Tool Used: AM-PAC 6 Clicks Basic Mobility     Larae Grooms, PTA 07/13/2017, 11:04 AM

## 2017-07-13 NOTE — Progress Notes (Signed)
   Subjective: 2 Days Post-Op Procedure(s) (LRB): COMPUTER ASSISTED TOTAL KNEE ARTHROPLASTY (Right) Patient reports pain as moderate.   Patient is well, and has had no acute complaints or problems Patient did extremely well yesterday with therapy. She met all harvest or home. Range of motion 0-87. Plan is to go Home after hospital stay. no nausea and no vomiting Patient denies any chest pains or shortness of breath. Objective: Vital signs in last 24 hours: Temp:  [97.7 F (36.5 C)-98.5 F (36.9 C)] 98.4 F (36.9 C) (12/12 0423) Pulse Rate:  [66-82] 70 (12/12 0423) Resp:  [19] 19 (12/12 0423) BP: (89-101)/(56-70) 98/56 (12/12 0423) SpO2:  [96 %-100 %] 100 % (12/12 0423) well approximated incision Heels are non tender and elevated off the bed using rolled towels Intake/Output from previous day: 12/11 0701 - 12/12 0700 In: 2766.7 [P.O.:1200; I.V.:1326.7; IV Piggyback:240] Out: 270 [Drains:270] Intake/Output this shift: Total I/O In: 736.7 [P.O.:480; I.V.:256.7] Out: 270 [Drains:270]  No results for input(s): HGB in the last 72 hours. No results for input(s): WBC, RBC, HCT, PLT in the last 72 hours. No results for input(s): NA, K, CL, CO2, BUN, CREATININE, GLUCOSE, CALCIUM in the last 72 hours. No results for input(s): LABPT, INR in the last 72 hours.  EXAM General - Patient is Alert, Appropriate and Oriented Extremity - Neurologically intact Neurovascular intact Sensation intact distally Intact pulses distally Dorsiflexion/Plantar flexion intact No cellulitis present Compartment soft Dressing - dressing C/D/I Motor Function - intact, moving foot and toes well on exam.    Past Medical History:  Diagnosis Date  . Boils   . Ear mass   . Hemorrhoids   . Herpes simplex virus infection   . HSV infection   . Lumbago with sciatica   . OA (osteoarthritis) of knee   . Obesity   . Overactive bladder   . PONV (postoperative nausea and vomiting)   . RA (rheumatoid  arthritis) ([place])   . Sleep apnea   . Urge and stress incontinence     Assessment/Plan: 2 Days Post-Op Procedure(s) (LRB): COMPUTER ASSISTED TOTAL KNEE ARTHROPLASTY (Right) Active Problems:   S/P total knee arthroplasty  Estimated body mass index is 38.25 kg/m as calculated from the following:   Height as of this encounter: '5\' 9"'$  (1.753 m).   Weight as of this encounter: 117.5 kg (259 lb). Up with therapy Discharge home with home health  Labs: None DVT Prophylaxis - Lovenox, Foot Pumps and TED hose Weight-Bearing as tolerated to right leg Hemovac was discontinued on today's visit. Tips of the Hemovac were visualized and appeared to be intact. Patient needs a bowel movement. Please wash operative leg and apply TED stockings. Please change dressing to operative leg prior to patient being discharged and give the patient 2 extra honeycomb dressings to take home.  Jillyn Ledger. [facility] 07/13/2017, 6:43 AM

## 2017-07-13 NOTE — Care Management Note (Signed)
Case Management Note  Patient Details  Name: Erin Contreras MRN: [id number] Date of Birth: 1961/01/16  Subjective/Objective:   Discharging today.                  Action/Plan: [facility] notified of discharge.  Expected Discharge Date:  07/13/17               Expected Discharge Plan:  [name]  In-House Referral:     Discharge planning Services  CM Consult  Post Acute Care Choice:  Durable Medical Equipment, Home Health Choice offered to:  Patient  DME Arranged:  Bedside commode DME Agency:  [place]:  PT [facility] Agency:  [facility] ([facility]  Status of Service:  Completed, signed off  If discussed at [employer] of Stay Meetings, dates discussed:    Additional Comments:  Jolly Mango, RN 07/13/2017, 8:43 AM

## 2017-07-14 DIAGNOSIS — Z6838 Body mass index (BMI) 38.0-38.9, adult: Secondary | ICD-10-CM | POA: Diagnosis not present

## 2017-07-14 DIAGNOSIS — G4733 Obstructive sleep apnea (adult) (pediatric): Secondary | ICD-10-CM | POA: Diagnosis not present

## 2017-07-14 DIAGNOSIS — N3281 Overactive bladder: Secondary | ICD-10-CM | POA: Diagnosis not present

## 2017-07-14 DIAGNOSIS — M069 Rheumatoid arthritis, unspecified: Secondary | ICD-10-CM | POA: Diagnosis not present

## 2017-07-14 DIAGNOSIS — M5440 Lumbago with sciatica, unspecified side: Secondary | ICD-10-CM | POA: Diagnosis not present

## 2017-07-14 DIAGNOSIS — I10 Essential (primary) hypertension: Secondary | ICD-10-CM | POA: Diagnosis not present

## 2017-07-14 DIAGNOSIS — Z96651 Presence of right artificial knee joint: Secondary | ICD-10-CM | POA: Diagnosis not present

## 2017-07-14 DIAGNOSIS — Z471 Aftercare following joint replacement surgery: Secondary | ICD-10-CM | POA: Diagnosis not present

## 2017-07-14 DIAGNOSIS — E669 Obesity, unspecified: Secondary | ICD-10-CM | POA: Diagnosis not present

## 2017-07-16 DIAGNOSIS — M5440 Lumbago with sciatica, unspecified side: Secondary | ICD-10-CM | POA: Diagnosis not present

## 2017-07-16 DIAGNOSIS — M069 Rheumatoid arthritis, unspecified: Secondary | ICD-10-CM | POA: Diagnosis not present

## 2017-07-16 DIAGNOSIS — I10 Essential (primary) hypertension: Secondary | ICD-10-CM | POA: Diagnosis not present

## 2017-07-16 DIAGNOSIS — Z471 Aftercare following joint replacement surgery: Secondary | ICD-10-CM | POA: Diagnosis not present

## 2017-07-16 DIAGNOSIS — E669 Obesity, unspecified: Secondary | ICD-10-CM | POA: Diagnosis not present

## 2017-07-16 DIAGNOSIS — Z96651 Presence of right artificial knee joint: Secondary | ICD-10-CM | POA: Diagnosis not present

## 2017-07-16 DIAGNOSIS — Z6838 Body mass index (BMI) 38.0-38.9, adult: Secondary | ICD-10-CM | POA: Diagnosis not present

## 2017-07-16 DIAGNOSIS — N3281 Overactive bladder: Secondary | ICD-10-CM | POA: Diagnosis not present

## 2017-07-16 DIAGNOSIS — G4733 Obstructive sleep apnea (adult) (pediatric): Secondary | ICD-10-CM | POA: Diagnosis not present

## 2017-07-18 DIAGNOSIS — Z6838 Body mass index (BMI) 38.0-38.9, adult: Secondary | ICD-10-CM | POA: Diagnosis not present

## 2017-07-18 DIAGNOSIS — I10 Essential (primary) hypertension: Secondary | ICD-10-CM | POA: Diagnosis not present

## 2017-07-18 DIAGNOSIS — M5440 Lumbago with sciatica, unspecified side: Secondary | ICD-10-CM | POA: Diagnosis not present

## 2017-07-18 DIAGNOSIS — Z96651 Presence of right artificial knee joint: Secondary | ICD-10-CM | POA: Diagnosis not present

## 2017-07-18 DIAGNOSIS — E669 Obesity, unspecified: Secondary | ICD-10-CM | POA: Diagnosis not present

## 2017-07-18 DIAGNOSIS — M069 Rheumatoid arthritis, unspecified: Secondary | ICD-10-CM | POA: Diagnosis not present

## 2017-07-18 DIAGNOSIS — G4733 Obstructive sleep apnea (adult) (pediatric): Secondary | ICD-10-CM | POA: Diagnosis not present

## 2017-07-18 DIAGNOSIS — Z471 Aftercare following joint replacement surgery: Secondary | ICD-10-CM | POA: Diagnosis not present

## 2017-07-18 DIAGNOSIS — N3281 Overactive bladder: Secondary | ICD-10-CM | POA: Diagnosis not present

## 2017-07-20 DIAGNOSIS — E669 Obesity, unspecified: Secondary | ICD-10-CM | POA: Diagnosis not present

## 2017-07-20 DIAGNOSIS — M069 Rheumatoid arthritis, unspecified: Secondary | ICD-10-CM | POA: Diagnosis not present

## 2017-07-20 DIAGNOSIS — Z96651 Presence of right artificial knee joint: Secondary | ICD-10-CM | POA: Diagnosis not present

## 2017-07-20 DIAGNOSIS — M5440 Lumbago with sciatica, unspecified side: Secondary | ICD-10-CM | POA: Diagnosis not present

## 2017-07-20 DIAGNOSIS — Z471 Aftercare following joint replacement surgery: Secondary | ICD-10-CM | POA: Diagnosis not present

## 2017-07-20 DIAGNOSIS — I10 Essential (primary) hypertension: Secondary | ICD-10-CM | POA: Diagnosis not present

## 2017-07-20 DIAGNOSIS — N3281 Overactive bladder: Secondary | ICD-10-CM | POA: Diagnosis not present

## 2017-07-20 DIAGNOSIS — G4733 Obstructive sleep apnea (adult) (pediatric): Secondary | ICD-10-CM | POA: Diagnosis not present

## 2017-07-20 DIAGNOSIS — Z6838 Body mass index (BMI) 38.0-38.9, adult: Secondary | ICD-10-CM | POA: Diagnosis not present

## 2017-07-22 DIAGNOSIS — Z6838 Body mass index (BMI) 38.0-38.9, adult: Secondary | ICD-10-CM | POA: Diagnosis not present

## 2017-07-22 DIAGNOSIS — Z471 Aftercare following joint replacement surgery: Secondary | ICD-10-CM | POA: Diagnosis not present

## 2017-07-22 DIAGNOSIS — I10 Essential (primary) hypertension: Secondary | ICD-10-CM | POA: Diagnosis not present

## 2017-07-22 DIAGNOSIS — G4733 Obstructive sleep apnea (adult) (pediatric): Secondary | ICD-10-CM | POA: Diagnosis not present

## 2017-07-22 DIAGNOSIS — N3281 Overactive bladder: Secondary | ICD-10-CM | POA: Diagnosis not present

## 2017-07-22 DIAGNOSIS — E669 Obesity, unspecified: Secondary | ICD-10-CM | POA: Diagnosis not present

## 2017-07-22 DIAGNOSIS — Z96651 Presence of right artificial knee joint: Secondary | ICD-10-CM | POA: Diagnosis not present

## 2017-07-22 DIAGNOSIS — M5440 Lumbago with sciatica, unspecified side: Secondary | ICD-10-CM | POA: Diagnosis not present

## 2017-07-22 DIAGNOSIS — M069 Rheumatoid arthritis, unspecified: Secondary | ICD-10-CM | POA: Diagnosis not present

## 2017-07-25 DIAGNOSIS — Z6838 Body mass index (BMI) 38.0-38.9, adult: Secondary | ICD-10-CM | POA: Diagnosis not present

## 2017-07-25 DIAGNOSIS — Z471 Aftercare following joint replacement surgery: Secondary | ICD-10-CM | POA: Diagnosis not present

## 2017-07-25 DIAGNOSIS — M5440 Lumbago with sciatica, unspecified side: Secondary | ICD-10-CM | POA: Diagnosis not present

## 2017-07-25 DIAGNOSIS — G4733 Obstructive sleep apnea (adult) (pediatric): Secondary | ICD-10-CM | POA: Diagnosis not present

## 2017-07-25 DIAGNOSIS — N3281 Overactive bladder: Secondary | ICD-10-CM | POA: Diagnosis not present

## 2017-07-25 DIAGNOSIS — E669 Obesity, unspecified: Secondary | ICD-10-CM | POA: Diagnosis not present

## 2017-07-25 DIAGNOSIS — M069 Rheumatoid arthritis, unspecified: Secondary | ICD-10-CM | POA: Diagnosis not present

## 2017-07-25 DIAGNOSIS — Z96651 Presence of right artificial knee joint: Secondary | ICD-10-CM | POA: Diagnosis not present

## 2017-07-25 DIAGNOSIS — I10 Essential (primary) hypertension: Secondary | ICD-10-CM | POA: Diagnosis not present

## 2017-07-28 DIAGNOSIS — Z96651 Presence of right artificial knee joint: Secondary | ICD-10-CM | POA: Diagnosis not present

## 2017-08-01 DIAGNOSIS — Z96651 Presence of right artificial knee joint: Secondary | ICD-10-CM | POA: Diagnosis not present

## 2017-08-03 DIAGNOSIS — Z96651 Presence of right artificial knee joint: Secondary | ICD-10-CM | POA: Diagnosis not present

## 2017-08-05 DIAGNOSIS — Z96651 Presence of right artificial knee joint: Secondary | ICD-10-CM | POA: Diagnosis not present

## 2017-08-08 DIAGNOSIS — Z96651 Presence of right artificial knee joint: Secondary | ICD-10-CM | POA: Diagnosis not present

## 2017-08-10 DIAGNOSIS — Z96651 Presence of right artificial knee joint: Secondary | ICD-10-CM | POA: Diagnosis not present

## 2017-08-12 DIAGNOSIS — Z96651 Presence of right artificial knee joint: Secondary | ICD-10-CM | POA: Diagnosis not present

## 2017-08-15 DIAGNOSIS — Z96651 Presence of right artificial knee joint: Secondary | ICD-10-CM | POA: Diagnosis not present

## 2017-08-15 DIAGNOSIS — M25661 Stiffness of right knee, not elsewhere classified: Secondary | ICD-10-CM | POA: Diagnosis not present

## 2017-08-15 DIAGNOSIS — G8929 Other chronic pain: Secondary | ICD-10-CM | POA: Diagnosis not present

## 2017-08-15 DIAGNOSIS — M25561 Pain in right knee: Secondary | ICD-10-CM | POA: Diagnosis not present

## 2017-08-15 DIAGNOSIS — M6281 Muscle weakness (generalized): Secondary | ICD-10-CM | POA: Diagnosis not present

## 2017-08-17 DIAGNOSIS — Z96651 Presence of right artificial knee joint: Secondary | ICD-10-CM | POA: Diagnosis not present

## 2017-08-19 DIAGNOSIS — Z96651 Presence of right artificial knee joint: Secondary | ICD-10-CM | POA: Diagnosis not present

## 2017-08-22 DIAGNOSIS — M25561 Pain in right knee: Secondary | ICD-10-CM | POA: Diagnosis not present

## 2017-08-22 DIAGNOSIS — Z96651 Presence of right artificial knee joint: Secondary | ICD-10-CM | POA: Diagnosis not present

## 2017-08-23 DIAGNOSIS — Z96651 Presence of right artificial knee joint: Secondary | ICD-10-CM | POA: Diagnosis not present

## 2017-09-12 DIAGNOSIS — Z471 Aftercare following joint replacement surgery: Secondary | ICD-10-CM | POA: Diagnosis not present

## 2017-11-17 ENCOUNTER — Ambulatory Visit: Payer: BLUE CROSS/BLUE SHIELD | Admitting: Family Medicine

## 2017-11-17 DIAGNOSIS — L0100 Impetigo, unspecified: Secondary | ICD-10-CM | POA: Diagnosis not present

## 2017-11-18 ENCOUNTER — Ambulatory Visit: Payer: BLUE CROSS/BLUE SHIELD | Admitting: Family Medicine

## 2017-12-30 ENCOUNTER — Ambulatory Visit (INDEPENDENT_AMBULATORY_CARE_PROVIDER_SITE_OTHER): Payer: BLUE CROSS/BLUE SHIELD | Admitting: Unknown Physician Specialty

## 2017-12-30 ENCOUNTER — Encounter: Payer: Self-pay | Admitting: Unknown Physician Specialty

## 2017-12-30 VITALS — BP 128/85 | HR 72 | Temp 98.0°F | Ht 67.5 in | Wt 271.0 lb

## 2017-12-30 DIAGNOSIS — E559 Vitamin D deficiency, unspecified: Secondary | ICD-10-CM | POA: Diagnosis not present

## 2017-12-30 DIAGNOSIS — G4700 Insomnia, unspecified: Secondary | ICD-10-CM | POA: Insufficient documentation

## 2017-12-30 DIAGNOSIS — M069 Rheumatoid arthritis, unspecified: Secondary | ICD-10-CM

## 2017-12-30 DIAGNOSIS — R5383 Other fatigue: Secondary | ICD-10-CM | POA: Diagnosis not present

## 2017-12-30 DIAGNOSIS — F5101 Primary insomnia: Secondary | ICD-10-CM | POA: Diagnosis not present

## 2017-12-30 DIAGNOSIS — Z0000 Encounter for general adult medical examination without abnormal findings: Secondary | ICD-10-CM

## 2017-12-30 DIAGNOSIS — R918 Other nonspecific abnormal finding of lung field: Secondary | ICD-10-CM | POA: Diagnosis not present

## 2017-12-30 DIAGNOSIS — G4733 Obstructive sleep apnea (adult) (pediatric): Secondary | ICD-10-CM | POA: Diagnosis not present

## 2017-12-30 DIAGNOSIS — Z23 Encounter for immunization: Secondary | ICD-10-CM

## 2017-12-30 DIAGNOSIS — L309 Dermatitis, unspecified: Secondary | ICD-10-CM | POA: Insufficient documentation

## 2017-12-30 DIAGNOSIS — L308 Other specified dermatitis: Secondary | ICD-10-CM | POA: Diagnosis not present

## 2017-12-30 MED ORDER — VITAMIN D (ERGOCALCIFEROL) 1.25 MG (50000 UNIT) PO CAPS: 50000.0000 [IU] | ORAL_CAPSULE | ORAL | 0 refills | Status: DC

## 2017-12-30 MED ORDER — BETAMETHASONE DIPROPIONATE AUG 0.05 % EX CREA: TOPICAL_CREAM | Freq: Two times a day (BID) | CUTANEOUS | 0 refills | Status: DC

## 2017-12-30 MED ORDER — TRAZODONE HCL 50 MG PO TABS: 25.0000 mg | ORAL_TABLET | Freq: Every evening | ORAL | 3 refills | Status: DC | PRN

## 2017-12-30 NOTE — Addendum Note (Signed)
Addended by: Kathrine Haddock on: 12/30/2017 04:04 PM   Modules accepted: Orders

## 2017-12-30 NOTE — Progress Notes (Signed)
BP 128/85   Pulse 72   Temp 98 F (36.7 C) (Oral)   Ht 5' 7.5" (1.715 m)   Wt 271 lb (122.9 kg)   SpO2 98%   BMI 41.82 kg/m    Subjective:    Patient ID: Erin Contreras, female    DOB: [birth year], 57 y.o.   MRN: [id number]  HPI: Erin Contreras is a 57 y.o. female  Chief Complaint  Patient presents with  . Annual Exam  . Rash    pt states she has a rash on her upper back, neck and under her arms. States it burns and itches some  . Fatigue    pt states she has been feeling very tired and fatigued lately. States she has only been getting about 4 hours of sleep per night  . Cough    pt states she has had a cough for about 2 weeks    Pt is here for her her general history and physical.  She is exhausted most of the time.  Only sleeps about 4 hours/night.  Falls to sleep easily at 8 or 9.  Often wakes up at 12 or 1 and awake the rest of the night.  History of sleep apnea but she felt it resolved after weight loss surgery.    Rash is ongoing for 1 1/2 -2 months  Pt brought in labs from work.  Normal except for very low Vit D.  LDH milly elevated at 239.   LDL elevated at 130  She has not been to see rhematology for her RA.  Hands get num and tingles.    Depression screen [facility] 2/9 12/30/2017 07/08/2017 07/08/2017  Decreased Interest 0 0 0  Down, Depressed, Hopeless 0 0 0  PHQ - 2 Score 0 0 0  Altered sleeping 0 - -  Tired, decreased energy 0 - -  Change in appetite 0 - -  Feeling bad or failure about yourself  0 - -  Trouble concentrating 0 - -  Moving slowly or fidgety/restless 0 - -  Suicidal thoughts 0 - -  PHQ-9 Score 0 - -  Difficult doing work/chores Not difficult at all - -     Social History   Socioeconomic History  . Marital status: Single    Spouse name: Not on file  . Number of children: Not on file  . Years of education: Not on file  . Highest education level: Not on file  Occupational History  . Not on file  Social Needs  . Financial resource  strain: Not on file  . Food insecurity:    Worry: Not on file    Inability: Not on file  . Transportation needs:    Medical: Not on file    Non-medical: Not on file  Tobacco Use  . Smoking status: Never Smoker  . Smokeless tobacco: Never Used  Substance and Sexual Activity  . Alcohol use: No  . Drug use: No  . Sexual activity: Not on file  Lifestyle  . Physical activity:    Days per week: Not on file    Minutes per session: Not on file  . Stress: Not on file  Relationships  . Social connections:    Talks on phone: Not on file    Gets together: Not on file    Attends religious service: Not on file    Active member of club or organization: Not on file    Attends meetings of clubs or organizations: Not on  file    Relationship status: Not on file  . Intimate partner violence:    Fear of current or ex partner: Not on file    Emotionally abused: Not on file    Physically abused: Not on file    Forced sexual activity: Not on file  Other Topics Concern  . Not on file  Social History Narrative  . Not on file   Family History  Problem Relation Age of Onset  . Cancer Mother        liver  . Diabetes Mother   . Hyperlipidemia Mother   . Hypertension Mother   . Cirrhosis Father        liver  . Hypertension Daughter   . Stroke Maternal Grandmother   . Stroke Maternal Grandfather   . Heart disease Paternal Grandmother   . Diabetes Paternal Grandmother   . Heart disease Paternal Grandfather   . Diabetes Paternal Grandfather    Past Medical History:  Diagnosis Date  . Boils   . Ear mass   . Hemorrhoids   . Herpes simplex virus infection   . HSV infection   . Lumbago with sciatica   . OA (osteoarthritis) of knee   . Obesity   . Overactive bladder   . PONV (postoperative nausea and vomiting)   . RA (rheumatoid arthritis) ([place])   . Sleep apnea   . Urge and stress incontinence    Past Surgical History:  Procedure Laterality Date  . CHOLECYSTECTOMY  2011  . FRACTURE  SURGERY Right    as a child  . KNEE ARTHROPLASTY Right 07/11/2017   Procedure: COMPUTER ASSISTED TOTAL KNEE ARTHROPLASTY;  Surgeon: Dereck Leep, MD;  Location: [facility] ORS;  Service: Orthopedics;  Laterality: Right;  . SLEEVE GASTROPLASTY    . TUMOR REMOVAL Right 2010   ear     Relevant past medical, surgical, family and social history reviewed and updated as indicated. Interim medical history since our last visit reviewed. Allergies and medications reviewed and updated.  Review of Systems  Constitutional: Positive for fatigue.  HENT: Negative.   Respiratory: Negative.   Cardiovascular: Negative.   Gastrointestinal: Negative.   Genitourinary: Negative.   Psychiatric/Behavioral: Negative.        Stress with leaving a relationship    Per HPI unless specifically indicated above     Objective:    BP 128/85   Pulse 72   Temp 98 F (36.7 C) (Oral)   Ht 5' 7.5" (1.715 m)   Wt 271 lb (122.9 kg)   SpO2 98%   BMI 41.82 kg/m   Wt Readings from Last 3 Encounters:  12/30/17 271 lb (122.9 kg)  07/11/17 259 lb (117.5 kg)  07/08/17 259 lb (117.5 kg)    Physical Exam  Constitutional: She is oriented to person, place, and time. She appears well-developed and well-nourished.  HENT:  Head: Normocephalic and atraumatic.  Eyes: Pupils are equal, round, and reactive to light. Right eye exhibits no discharge. Left eye exhibits no discharge. No scleral icterus.  Neck: Normal range of motion. Neck supple. Carotid bruit is not present. No thyromegaly present.  Cardiovascular: Normal rate, regular rhythm and normal heart sounds. Exam reveals no gallop and no friction rub.  No murmur heard. Pulmonary/Chest: Effort normal and breath sounds normal. No respiratory distress. She has no wheezes. She has no rales. No breast tenderness or discharge.  Abdominal: Soft. Bowel sounds are normal. There is no tenderness. There is no rebound.  Genitourinary: No breast tenderness  or discharge.    Musculoskeletal: Normal range of motion.  Lymphadenopathy:    She has no cervical adenopathy.  Neurological: She is alert and oriented to person, place, and time.  Skin: Skin is warm, dry and intact.  Scattered round scaley areas on neck, back and breast  Psychiatric: She has a normal mood and affect. Her speech is normal and behavior is normal. Judgment and thought content normal. Cognition and memory are normal.    Results for orders placed or performed during the hospital encounter of 07/11/17  ABO/Rh  Result Value Ref Range   ABO/RH(D) A POS       Assessment & Plan:   Problem List Items Addressed This Visit      Unprioritized   Eczema    Rx for Diprolene cream      Fatigue    Suspect multi-factoral.  Will refill Vit D, get CT scan to f/u on multiple lung nodules.  Rx for Trazadone to help with sleep.  She chooses not to see Rheumatology for f/u due to finances      Insomnia    Start Trazadone 50 mg. QHS.  Consider sleep study      Multiple nodules of lung    Review of chart.  Routine CT before obesity surgery in 2016 showing  multiple lung nodules and lost to f/u.  Will schedule a f/u CT.        Obstructive sleep apnea of adult    Pt states this was only a problem before obesity surgery and has resolved.  However, not aware of snoring      RA (rheumatoid arthritis) ([place])    Lost to f/u with rheumatology due to finances      Vitamin D deficiency    Very low at 14.4.  Rx for weekly Vit D for 3 months then transition to 2000 iu's/day of OTC D3       Other Visit Diagnoses    Need for Td vaccine    -  Primary   Relevant Orders   Td vaccine greater than or equal to 7yo preservative free IM (Completed)   Abnormal findings on diagnostic imaging of lung       Relevant Orders   CT Chest Wo Contrast   Routine general medical examination at a health care facility       Relevant Orders   MM DIGITAL SCREENING BILATERAL       Follow up plan: Return in about 1  month (around 01/27/2018).

## 2017-12-30 NOTE — Assessment & Plan Note (Signed)
Pt states this was only a problem before obesity surgery and has resolved.  However, not aware of snoring

## 2017-12-30 NOTE — Assessment & Plan Note (Signed)
Very low at 14.4.  Rx for weekly Vit D for 3 months then transition to 2000 iu's/day of OTC D3

## 2017-12-30 NOTE — Assessment & Plan Note (Signed)
Start Trazadone 50 mg. QHS.  Consider sleep study

## 2017-12-30 NOTE — Assessment & Plan Note (Signed)
Rx for Diprolene cream

## 2017-12-30 NOTE — Assessment & Plan Note (Signed)
Suspect multi-factoral.  Will refill Vit D, get CT scan to f/u on multiple lung nodules.  Rx for Trazadone to help with sleep.  She chooses not to see Rheumatology for f/u due to finances

## 2017-12-30 NOTE — Patient Instructions (Addendum)
Td Vaccine (Tetanus and Diphtheria): What You Need to Know 1. Why get vaccinated? Tetanus  and diphtheria are very serious diseases. They are rare in the [place] today, but people who do become infected often have severe complications. Td vaccine is used to protect adolescents and adults from both of these diseases. Both tetanus and diphtheria are infections caused by bacteria. Diphtheria spreads from person to person through coughing or sneezing. Tetanus-causing bacteria enter the body through cuts, scratches, or wounds. TETANUS (lockjaw) causes painful muscle tightening and stiffness, usually all over the body.  It can lead to tightening of muscles in the head and neck so you can't open your mouth, swallow, or sometimes even breathe. Tetanus kills about 1 out of every 10 people who are infected even after receiving the best medical care.  DIPHTHERIA can cause a thick coating to form in the back of the throat.  It can lead to breathing problems, paralysis, heart failure, and death.  Before vaccines, as many as 200,000 cases of diphtheria and hundreds of cases of tetanus were reported in the [place] each year. Since vaccination began, reports of cases for both diseases have dropped by about 99%. 2. Td vaccine Td vaccine can protect adolescents and adults from tetanus and diphtheria. Td is usually given as a booster dose every 10 years but it can also be given earlier after a severe and dirty wound or burn. Another vaccine, called Tdap, which protects against pertussis in addition to tetanus and diphtheria, is sometimes recommended instead of Td vaccine. Your doctor or the person giving you the vaccine can give you more information. Td may safely be given at the same time as other vaccines. 3. Some people should not get this vaccine  A person who has ever had a life-threatening allergic reaction after a previous dose of any tetanus or diphtheria containing vaccine, OR has a severe  allergy to any part of this vaccine, should not get Td vaccine. Tell the person giving the vaccine about any severe allergies.  Talk to your doctor if you: ? had severe pain or swelling after any vaccine containing diphtheria or tetanus, ? ever had a condition called Guillain Barre Syndrome (GBS), ? aren't feeling well on the day the shot is scheduled. 4. What are the risks from Td vaccine? With any medicine, including vaccines, there is a chance of side effects. These are usually mild and go away on their own. Serious reactions are also possible but are rare. Most people who get Td vaccine do not have any problems with it. Mild problems following Td vaccine: (Did not interfere with activities)  Pain where the shot was given (about 8 people in 10)  Redness or swelling where the shot was given (about 1 person in 4)  Mild fever (rare)  Headache (about 1 person in 4)  Tiredness (about 1 person in 4)  Moderate problems following Td vaccine: (Interfered with activities, but did not require medical attention)  Fever over 102F (rare)  Severe problems following Td vaccine: (Unable to perform usual activities; required medical attention)  Swelling, severe pain, bleeding and/or redness in the arm where the shot was given (rare).  Problems that could happen after any vaccine:  People sometimes faint after a medical procedure, including vaccination. Sitting or lying down for about 15 minutes can help prevent fainting, and injuries caused by a fall. Tell your doctor if you feel dizzy, or have vision changes or ringing in the ears.  Some people get   severe pain in the shoulder and have difficulty moving the arm where a shot was given. This happens very rarely.  Any medication can cause a severe allergic reaction. Such reactions from a vaccine are very rare, estimated at fewer than 1 in a million doses, and would happen within a few minutes to a few hours after the vaccination. As with any  medicine, there is a very remote chance of a vaccine causing a serious injury or death. The safety of vaccines is always being monitored. For more information, visit: http://www.aguilar.org/ 5. What if there is a serious reaction? What should I look for? Look for anything that concerns you, such as signs of a severe allergic reaction, very high fever, or unusual behavior. Signs of a severe allergic reaction can include hives, swelling of the face and throat, difficulty breathing, a fast heartbeat, dizziness, and weakness. These would usually start a few minutes to a few hours after the vaccination. What should I do?  If you think it is a severe allergic reaction or other emergency that can't wait, call 9-1-1 or get the person to the nearest hospital. Otherwise, call your doctor.  Afterward, the reaction should be reported to the Vaccine Adverse Event Reporting System (VAERS). Your doctor might file this report, or you can do it yourself through the VAERS web site at www.vaers.SamedayNews.es, or by calling 4052766277. ? VAERS does not give medical advice. 6. The National Vaccine Injury Compensation Program The [employer] Vaccine Injury Compensation Program (VICP) is a federal program that was created to compensate people who may have been injured by certain vaccines. Persons who believe they may have been injured by a vaccine can learn about the program and about filing a claim by calling (904) 328-4686 or visiting the [place] website at GoldCloset.com.ee. There is a time limit to file a claim for compensation. 7. How can I learn more?  Ask your doctor. He or she can give you the vaccine package insert or suggest other sources of information.  Call your local or state health department.  Contact the Centers for Disease Control and Prevention (CDC): ? Call 808-203-2862 (1-800-CDC-INFO) ? Visit CDC's website at http://hunter.com/ CDC Td Vaccine VIS (11/11/15) This information is  not intended to replace advice given to you by your health care provider. Make sure you discuss any questions you have with your health care provider. ----------------------------------------------------------- Rx for weekly Vit D for 3 months then transition to 2000 iu's/day of OTC D#

## 2017-12-30 NOTE — Assessment & Plan Note (Signed)
Lost to f/u with rheumatology due to finances

## 2017-12-30 NOTE — Assessment & Plan Note (Addendum)
Review of chart.  Routine CT before obesity surgery in 2016 showing  multiple lung nodules and lost to f/u.  Will schedule a f/u CT.

## 2018-01-05 LAB — IGP, APTIMA HPV, RFX 16/18,45
HPV Aptima: POSITIVE — AB
HPV GENOTYPE 16: NEGATIVE
HPV Genotype 18,45: NEGATIVE
PAP Smear Comment: 0

## 2018-01-09 ENCOUNTER — Other Ambulatory Visit: Payer: Self-pay | Admitting: Unknown Physician Specialty

## 2018-01-09 DIAGNOSIS — R87820 Cervical low risk human papillomavirus (HPV) DNA test positive: Secondary | ICD-10-CM

## 2018-01-09 NOTE — Progress Notes (Signed)
Repeat pap in one year.

## 2018-01-10 ENCOUNTER — Ambulatory Visit: Payer: BLUE CROSS/BLUE SHIELD

## 2018-01-12 ENCOUNTER — Telehealth: Payer: Self-pay

## 2018-01-12 NOTE — Telephone Encounter (Signed)
Called and left patient a VM (signed DPR) letting her know what Dr. Jeananne Rama said. Asked for her to call back with any other questions or concerns.

## 2018-01-12 NOTE — Telephone Encounter (Signed)
Nonspecific infection just needs routine follow-up 1 year

## 2018-01-12 NOTE — Telephone Encounter (Signed)
Copied from [place] [id number]. Topic: Quick Communication - See Telephone Encounter >> Jan 12, 2018  9:33 AM Antonieta Iba C wrote: CRM for notification. See Telephone encounter for: 01/12/18.  Pt says that she received a call stating that she tested positive for something, pt says that she's is unsure of what. Pt would like to discuss further.    CB: [phone]   Routing to provider. 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 What can I tell the patient or explain further to her?

## 2018-01-17 DIAGNOSIS — Z96651 Presence of right artificial knee joint: Secondary | ICD-10-CM | POA: Diagnosis not present

## 2018-01-20 ENCOUNTER — Encounter: Payer: Self-pay | Admitting: Physician Assistant

## 2018-01-20 ENCOUNTER — Ambulatory Visit: Payer: BLUE CROSS/BLUE SHIELD | Admitting: Physician Assistant

## 2018-01-20 VITALS — BP 113/75 | HR 86 | Wt 269.0 lb

## 2018-01-20 DIAGNOSIS — L304 Erythema intertrigo: Secondary | ICD-10-CM

## 2018-01-20 MED ORDER — TERBINAFINE HCL 1 % EX CREA: 1.0000 "application " | TOPICAL_CREAM | Freq: Two times a day (BID) | CUTANEOUS | 0 refills | Status: DC

## 2018-01-20 NOTE — Patient Instructions (Addendum)
Can take 2000 international units vitamin D daily - you can get this OTC      Intertrigo Intertrigo is skin irritation (inflammation) that happens in warm, moist areas of the body. The irritation can cause a rash and make skin raw and itchy. The rash is usually pink or red. It happens mostly between folds of skin or where skin rubs together, such as:  Toes.  Armpits.  Groin.  Belly.  Breasts.  Buttocks.  This condition is not passed from person to person (is not contagious). Follow these instructions at home:  Keep the affected area clean and dry.  Do not scratch your skin.  Stay cool as much as possible. Use an air conditioner or fan, if you can.  Apply over-the-counter and prescription medicines only as told by your doctor.  If you were prescribed an antibiotic medicine, use it as told by your doctor. Do not stop using the antibiotic even if your condition starts to get better.  Keep all follow-up visits as told by your doctor. This is important. How is this prevented?  Stay at a healthy weight.  Keep your feet dry. This is very important if you have diabetes. Wear cotton or wool socks.  Take care of and protect the skin in your groin and butt area as told by your doctor.  Do not wear tight clothes. Wear clothes that: ? Are loose. ? Take away moisture from your body. ? Are made of cotton.  Wear a bra that gives good support, if needed.  Shower and dry yourself fully after being active.  Keep your blood sugar under control if you have diabetes. Contact a doctor if:  Your symptoms do not get better with treatment.  Your symptoms get worse or they spread.  You notice more redness and warmth.  You have a fever. This information is not intended to replace advice given to you by your health care provider. Make sure you discuss any questions you have with your health care provider. Document Released: 08/21/2010 Document Revised: 12/25/2015 Document Reviewed:  01/20/2015 Elsevier Interactive Patient Education  [employer].

## 2018-01-20 NOTE — Progress Notes (Signed)
   Subjective:    Patient ID: Erin Contreras, female    DOB: 1961-05-18, 57 y.o.   MRN: [id number]  Erin Contreras is a 58 y.o. female presenting on 01/20/2018 for Rash (Patient complains of "rash" under breast and pubic area. Present x 1 week. Denies pain or itching, only if it gets "wet")   HPI   Red itchy, located in intertriginous regions under breast and in groin. Has tried nothing for it.   Social History   Tobacco Use  . Smoking status: Never Smoker  . Smokeless tobacco: Never Used  Substance Use Topics  . Alcohol use: No  . Drug use: No    Review of Systems Per HPI unless specifically indicated above     Objective:    BP 113/75   Pulse 86   Wt 269 lb (122 kg)   SpO2 97%   BMI 41.51 kg/m   Wt Readings from Last 3 Encounters:  01/20/18 269 lb (122 kg)  12/30/17 271 lb (122.9 kg)  07/11/17 259 lb (117.5 kg)    Physical Exam  Constitutional: She is oriented to person, place, and time. She appears well-developed and well-nourished.  Cardiovascular: Normal rate and regular rhythm.  Pulmonary/Chest: Effort normal and breath sounds normal.  Neurological: She is alert and oriented to person, place, and time.  Skin: Skin is warm and dry. Rash noted.     Erythema and scattered papules under breast and inguinal folds.   Psychiatric: She has a normal mood and affect. Her behavior is normal.   Results for orders placed or performed in visit on 12/30/17  IGP, Aptima HPV, rfx 16/18,45  Result Value Ref Range   DIAGNOSIS: Comment    Specimen adequacy: Comment    Clinician Provided ICD10 Comment    Performed by: Comment    PAP Smear Comment .    Note: Comment    Test Methodology Comment    HPV Aptima Positive (A) Negative   HPV Genotype 16 Negative Negative   HPV Genotype 18,45 Negative Negative      Assessment & Plan:   1. Intertrigo  - terbinafine (LAMISIL) 1 % cream; Apply 1 application topically 2 (two) times daily. Apply to affected area twice a day.   Dispense: 30 g; Refill: 0    Follow up plan: Return if symptoms worsen or fail to improve.  Carles Collet, PA-C [facility] 01/20/2018, 1:41 PM

## 2018-01-25 ENCOUNTER — Ambulatory Visit
Admission: RE | Admit: 2018-01-25 | Discharge: 2018-01-25 | Disposition: A | Discharge status: Home / Self Care | Payer: BLUE CROSS/BLUE SHIELD | Source: Ambulatory Visit | Attending: Unknown Physician Specialty | Admitting: Unknown Physician Specialty

## 2018-01-25 DIAGNOSIS — R911 Solitary pulmonary nodule: Secondary | ICD-10-CM | POA: Diagnosis not present

## 2018-01-25 DIAGNOSIS — J479 Bronchiectasis, uncomplicated: Secondary | ICD-10-CM | POA: Insufficient documentation

## 2018-01-25 DIAGNOSIS — R918 Other nonspecific abnormal finding of lung field: Secondary | ICD-10-CM | POA: Diagnosis not present

## 2018-01-25 DIAGNOSIS — K449 Diaphragmatic hernia without obstruction or gangrene: Secondary | ICD-10-CM | POA: Diagnosis not present

## 2018-01-25 DIAGNOSIS — J984 Other disorders of lung: Secondary | ICD-10-CM | POA: Diagnosis not present

## 2018-02-03 ENCOUNTER — Ambulatory Visit (INDEPENDENT_AMBULATORY_CARE_PROVIDER_SITE_OTHER): Payer: BLUE CROSS/BLUE SHIELD | Admitting: Unknown Physician Specialty

## 2018-02-03 ENCOUNTER — Encounter: Payer: Self-pay | Admitting: Unknown Physician Specialty

## 2018-02-03 VITALS — BP 126/89 | HR 64 | Temp 98.1°F | Ht 67.5 in | Wt 271.0 lb

## 2018-02-03 DIAGNOSIS — G4733 Obstructive sleep apnea (adult) (pediatric): Secondary | ICD-10-CM

## 2018-02-03 DIAGNOSIS — B977 Papillomavirus as the cause of diseases classified elsewhere: Secondary | ICD-10-CM | POA: Insufficient documentation

## 2018-02-03 DIAGNOSIS — F5101 Primary insomnia: Secondary | ICD-10-CM

## 2018-02-03 DIAGNOSIS — R918 Other nonspecific abnormal finding of lung field: Secondary | ICD-10-CM

## 2018-02-03 DIAGNOSIS — R5383 Other fatigue: Secondary | ICD-10-CM | POA: Diagnosis not present

## 2018-02-03 DIAGNOSIS — E559 Vitamin D deficiency, unspecified: Secondary | ICD-10-CM

## 2018-02-03 MED ORDER — NYSTATIN 100000 UNIT/GM EX OINT: 1.0000 "application " | TOPICAL_OINTMENT | Freq: Two times a day (BID) | CUTANEOUS | 0 refills | Status: DC

## 2018-02-03 MED ORDER — NYSTATIN 100000 UNIT/GM EX POWD: Freq: Four times a day (QID) | CUTANEOUS | 0 refills | Status: DC

## 2018-02-03 NOTE — Patient Instructions (Signed)
Human Papillomavirus  Human papillomavirus (HPV) is the most common sexually transmitted infection (STI). It easily spreads from person to person (is highly contagious). HPV infections cause genital warts. Certain types of HPV may cause cancers, including cancer of the lower part of the uterus (cervix), vagina, outer female genital area (vulva), penis, anus, and rectum. HPV may also cause cancers of the oral cavity, such as the throat, tongue, and tonsils.  There are many types of HPV. It usually does not cause symptoms. However, sometimes there are wart-like lesions in the throat or warts in the genital area that you can see or feel. It is possible to be infected for long periods and pass HPV to others without knowing it.  What are the causes?  HPV is caused by a virus that spreads from person to person through sexual contact. This includes oral, vaginal, or anal sex.  What increases the risk?  The following factors may make you more likely to develop this condition:  · Having unprotected oral, vaginal, or anal sex.  · Having several sex partners.  · Having a sex partner who has other sex partners.  · Having or having had another STI.  · Having a weak disease-fighting (immune) system.  · Having damaged skin in the genital area.    What are the signs or symptoms?  Most people who have HPV do not have any symptoms. If symptoms are present, they may include:  · Wartlike lesions in the throat (from having oral sex).  · Warts on the infected skin or mucous membranes.  · Genital warts that may itch, burn, bleed, or be painful during sexual intercourse.    How is this diagnosed?  If wartlike lesions are present in the throat or if genital warts are present, your health care provider can usually diagnose HPV with a physical exam. Genital warts are easily seen. In females, tests may be used to diagnose HPV, including:  · A Pap test. A Pap test takes a sample of cells from your cervix to check for cancer and HPV  infection.  · An HPV test. This is similar to a Pap test and involves taking a sample of cells from your cervix.  · Using a scope to view the cervix (colposcopy). This may be done if a pelvic exam or Pap test is abnormal. A sample of tissue may be removed for testing (biopsy) during the colposcopy.    Currently, there is no test to detect HPV in males.  How is this treated?  There is no treatment for the virus itself. However, there are treatments for the health problems and symptoms HPV can cause. Your health care provider will monitor you closely after you are treated as HPV can come back and may need treatment again. Treatment for HPV may include:  · Medicines, which may be injected or applied to genital warts in a cream, lotion, liquid or gel form.  · Use of a probe to apply extreme cold (cryotherapy) to the genital warts.  · Application of an intense beam of light (laser treatment) on the genital warts.  · Use of a probe to apply extreme heat (electrocautery) on the genital warts.  · Surgery to remove the genital warts.    Follow these instructions at home:  Medicines  · Take over-the-counter and prescription medicines only as told by your health care provider. This include creams for itching or irritation.  · Do not treat genital warts with medicines used for treating hand warts.    General instructions  · Do not touch or scratch the warts.  · Do not have sex while you are being treated.  · Do not douche or use tampons during treatment (women).  · Tell your sex partner about your infection. He or she may also need to be treated.  · If you become pregnant, tell your health care provider that you have HPV. Your health care provider will monitor you closely during pregnancy to make sure your baby is safe.  · Keep all follow-up visits as told by your health care provider. This is important.  How is this prevented?  · Talk with your health care provider about getting the HPV vaccines. These vaccines prevent some HPV  infections and cancers. The vaccines are recommended for males and females between the ages of 9 and 26. They will not work if you already have HPV, and they are not recommended for pregnant women.  · After treatment, use condoms during sex to prevent future infections.  · Have only one sex partner.  · Have a sex partner who does not have other sex partners.  · Get regular Pap tests as directed by your health care provider.  Contact a health care provider if:  · The treated skin becomes red, swollen, or painful.  · You have a fever.  · You feel generally ill.  · You feel lumps or pimples sticking out in and around your genital area.  · You develop bleeding of the vagina or the treatment area.  · You have painful sexual intercourse.  Summary  · Human papillomavirus (HPV) is the most common sexually transmitted infection (STI) and is highly contagious.  · Most people carrying HPV do not have any symptoms.  · HPV can be prevented with vaccination. The vaccine is recommended for males and females between the ages of 9 and 26.  · There is no treatment for the virus itself. However, there are treatments for the health problems and symptoms HPV can cause.  This information is not intended to replace advice given to you by your health care provider. Make sure you discuss any questions you have with your health care provider.  Document Released: 10/09/2003 Document Revised: 06/27/2016 Document Reviewed: 06/27/2016  Elsevier Interactive Patient Education © 2018 Elsevier Inc.

## 2018-02-03 NOTE — Assessment & Plan Note (Signed)
Check labs today.  Will transition to 2000 IUs/day

## 2018-02-03 NOTE — Assessment & Plan Note (Signed)
This is improved with Trazadone.  Suspect dry mouth associated with mouth breathing.  Schedule sleep study

## 2018-02-03 NOTE — Assessment & Plan Note (Signed)
Discussed and pt ed. Will need pap smear 12/2018

## 2018-02-03 NOTE — Assessment & Plan Note (Signed)
Improved with better sleep

## 2018-02-03 NOTE — Progress Notes (Signed)
BP 126/89   Pulse 64   Temp 98.1 F (36.7 C) (Oral)   Ht 5' 7.5" (1.715 m)   Wt 271 lb (122.9 kg)   SpO2 99%   BMI 41.82 kg/m    Subjective:    Patient ID: Erin Contreras, female    DOB: March 05, 1961, 57 y.o.   MRN: [id number]  HPI: Erin Contreras is a 57 y.o. female  Chief Complaint  Patient presents with  . Follow-up   Fatigue Last visit, pt was complaining of being exhausted and not sleeping well.  We started Trazadone 50 mg but making mouth dry.  However, this has worked well and is sleeping through the night for most night.  May wake up but able to fall asleep.  Started   HPV Positive noted on physical.  Has some questions.   Rash Underneath bilateral breasts.  Lamisil given 2 weeks ago.  States it is a  little better.  Feels it is not helping much.    Vit D deficiency Finished weekly Vit D.  Probably took in more than once a week.     Relevant past medical, surgical, family and social history reviewed and updated as indicated. Interim medical history since our last visit reviewed. Allergies and medications reviewed and updated.  Review of Systems  Per HPI unless specifically indicated above     Objective:    BP 126/89   Pulse 64   Temp 98.1 F (36.7 C) (Oral)   Ht 5' 7.5" (1.715 m)   Wt 271 lb (122.9 kg)   SpO2 99%   BMI 41.82 kg/m   Wt Readings from Last 3 Encounters:  02/03/18 271 lb (122.9 kg)  01/20/18 269 lb (122 kg)  12/30/17 271 lb (122.9 kg)    Physical Exam  Constitutional: She is oriented to person, place, and time. She appears well-developed and well-nourished. No distress.  HENT:  Head: Normocephalic and atraumatic.  Eyes: Conjunctivae and lids are normal. Right eye exhibits no discharge. Left eye exhibits no discharge. No scleral icterus.  Neck: Normal range of motion. Neck supple. No JVD present. Carotid bruit is not present.  Cardiovascular: Normal rate, regular rhythm and normal heart sounds.  Pulmonary/Chest: Effort normal and  breath sounds normal.  Abdominal: Normal appearance. There is no splenomegaly or hepatomegaly.  Musculoskeletal: Normal range of motion.  Neurological: She is alert and oriented to person, place, and time.  Skin: Skin is warm, dry and intact. No rash noted. No pallor.  Psychiatric: She has a normal mood and affect. Her behavior is normal. Judgment and thought content normal.    Results for orders placed or performed in visit on 12/30/17  IGP, Aptima HPV, rfx 16/18,45  Result Value Ref Range   DIAGNOSIS: Comment    Specimen adequacy: Comment    Clinician Provided ICD10 Comment    Performed by: Comment    PAP Smear Comment .    Note: Comment    Test Methodology Comment    HPV Aptima Positive (A) Negative   HPV Genotype 16 Negative Negative   HPV Genotype 18,45 Negative Negative      Assessment & Plan:   Problem List Items Addressed This Visit      Unprioritized   Fatigue    Improved with better sleep      HPV (human papilloma virus) infection    Discussed and pt ed. Will need pap smear 12/2018      Relevant Medications   nystatin ointment (MYCOSTATIN)  nystatin (MYCOSTATIN/NYSTOP) powder   Insomnia    This is improved with Trazadone.  Suspect dry mouth associated with mouth breathing.  Schedule sleep study      Relevant Orders   Ambulatory referral to Sleep Studies   Multiple nodules of lung    Chest CT in the last month showed stable nodule.  No f/u needed      Obstructive sleep apnea of adult - Primary   Relevant Orders   Ambulatory referral to Sleep Studies   Vitamin D deficiency    Check labs today.  Will transition to 2000 IUs/day      Relevant Orders   VITAMIN D 25 Hydroxy (Vit-D Deficiency, Fractures)       Follow up plan: Return in about 3 months (around 05/06/2018).

## 2018-02-03 NOTE — Assessment & Plan Note (Signed)
Chest CT in the last month showed stable nodule.  No f/u needed

## 2018-02-04 LAB — VITAMIN D 25 HYDROXY (VIT D DEFICIENCY, FRACTURES): VIT D 25 HYDROXY: 18.1 ng/mL — AB (ref 30.0–100.0)

## 2018-02-06 ENCOUNTER — Encounter: Payer: Self-pay | Admitting: Unknown Physician Specialty

## 2018-02-09 ENCOUNTER — Ambulatory Visit
Admission: RE | Admit: 2018-02-09 | Discharge: 2018-02-09 | Disposition: A | Discharge status: Home / Self Care | Payer: BLUE CROSS/BLUE SHIELD | Source: Ambulatory Visit | Attending: Unknown Physician Specialty | Admitting: Unknown Physician Specialty

## 2018-02-09 DIAGNOSIS — Z0000 Encounter for general adult medical examination without abnormal findings: Secondary | ICD-10-CM | POA: Diagnosis not present

## 2018-02-09 DIAGNOSIS — Z1231 Encounter for screening mammogram for malignant neoplasm of breast: Secondary | ICD-10-CM | POA: Diagnosis not present

## 2018-02-20 ENCOUNTER — Inpatient Hospital Stay
Admission: RE | Admit: 2018-02-20 | Discharge: 2018-02-20 | Disposition: A | Discharge status: Home / Self Care | Payer: Self-pay | Source: Ambulatory Visit | Attending: *Deleted | Admitting: *Deleted

## 2018-02-20 ENCOUNTER — Other Ambulatory Visit: Payer: Self-pay | Admitting: *Deleted

## 2018-02-20 DIAGNOSIS — Z9289 Personal history of other medical treatment: Secondary | ICD-10-CM

## 2018-02-21 NOTE — Progress Notes (Signed)
Disregard. Report ran patient needed mammogram. Mammogram done 02/09/2018.

## 2018-02-23 ENCOUNTER — Other Ambulatory Visit: Payer: Self-pay | Admitting: Unknown Physician Specialty

## 2018-03-16 ENCOUNTER — Other Ambulatory Visit: Payer: Self-pay | Admitting: Unknown Physician Specialty

## 2018-03-17 ENCOUNTER — Encounter: Payer: Self-pay | Admitting: Unknown Physician Specialty

## 2018-03-17 ENCOUNTER — Ambulatory Visit: Payer: BLUE CROSS/BLUE SHIELD | Admitting: Unknown Physician Specialty

## 2018-03-17 VITALS — BP 132/86 | HR 64 | Temp 98.2°F | Wt 271.0 lb

## 2018-03-17 DIAGNOSIS — M069 Rheumatoid arthritis, unspecified: Secondary | ICD-10-CM | POA: Diagnosis not present

## 2018-03-17 DIAGNOSIS — R21 Rash and other nonspecific skin eruption: Secondary | ICD-10-CM | POA: Diagnosis not present

## 2018-03-17 MED ORDER — BETAMETHASONE DIPROPIONATE AUG 0.05 % EX CREA: TOPICAL_CREAM | Freq: Two times a day (BID) | CUTANEOUS | 0 refills | Status: DC

## 2018-03-17 MED ORDER — PREDNISONE 10 MG (21) PO TBPK: ORAL_TABLET | ORAL | 0 refills | Status: DC

## 2018-03-17 NOTE — Progress Notes (Signed)
BP 132/86 (BP Location: Left Arm, Patient Position: Sitting, Cuff Size: Normal)   Pulse 64   Temp 98.2 F (36.8 C)   Wt 271 lb (122.9 kg) Comment: Patient reported  SpO2 99%   BMI 41.82 kg/m    Subjective:    Patient ID: Erin Contreras, female    DOB: 1961-06-23, 57 y.o.   MRN: [id number]  HPI: Erin Contreras is a 57 y.o. female  Chief Complaint  Patient presents with  . Rash    x a couple of weeks, scalp under arm, burns and itches   Rash Pt has several areas on her body that itch and burn.  Pt states she has a bilateral scalp rash and can feel bumps on her head.  Has a circular area on her left upper chest that started off small and is bigger.  Underneath right lower breast is another circular area.  Pt states her scalp itches and burns and flakes.  Areas come and go and leave dark spots.  Has been going on for about 2 months.  No worsening or alleviating factors. Nystatin ointment without any improvement.  Possibly relief with steroid cream.     Relevant past medical, surgical, family and social history reviewed and updated as indicated. Interim medical history since our last visit reviewed. Allergies and medications reviewed and updated.  Review of Systems  Per HPI unless specifically indicated above     Objective:    BP 132/86 (BP Location: Left Arm, Patient Position: Sitting, Cuff Size: Normal)   Pulse 64   Temp 98.2 F (36.8 C)   Wt 271 lb (122.9 kg) Comment: Patient reported  SpO2 99%   BMI 41.82 kg/m   Wt Readings from Last 3 Encounters:  03/17/18 271 lb (122.9 kg)  02/03/18 271 lb (122.9 kg)  01/20/18 269 lb (122 kg)    Physical Exam  Constitutional: She is oriented to person, place, and time. She appears well-developed and well-nourished. No distress.  HENT:  Head: Normocephalic and atraumatic.  Eyes: Conjunctivae and lids are normal. Right eye exhibits no discharge. Left eye exhibits no discharge. No scleral icterus.  Neck: Normal range of motion.  Neck supple. No JVD present. Carotid bruit is not present.  Cardiovascular: Normal rate, regular rhythm and normal heart sounds.  Pulmonary/Chest: Effort normal and breath sounds normal.  Abdominal: Normal appearance. There is no splenomegaly or hepatomegaly.  Musculoskeletal: Normal range of motion.  Neurological: She is alert and oriented to person, place, and time.  Skin: Skin is warm, dry and intact. No rash noted. No pallor.  Circular dry scaley patches scalp, left upper chest, Left under arm.  All areas nickel size.    Psychiatric: She has a normal mood and affect. Her behavior is normal. Judgment and thought content normal.    Results for orders placed or performed in visit on 02/03/18  VITAMIN D 25 Hydroxy (Vit-D Deficiency, Fractures)  Result Value Ref Range   Vit D, 25-Hydroxy 18.1 (L) 30.0 - 100.0 ng/mL      Assessment & Plan:   Problem List Items Addressed This Visit      Unprioritized   RA (rheumatoid arthritis) ([place])    No current treatment.  Check ANA, ESR, Sed rate.        Relevant Medications   predniSONE (STERAPRED UNI-PAK 21 TAB) 10 MG (21) TBPK tablet    Other Visit Diagnoses    Rash    -  Primary   ? psoriasis vs eczema.  Will rx steroid cream.  Short burst of prednisone to help scalp lesions.  ? auto-mmune issues as history of RA.     Relevant Orders   Ambulatory referral to Dermatology     Refer to dermatology for further management.     Follow up plan: Return if symptoms worsen or fail to improve.

## 2018-03-17 NOTE — Assessment & Plan Note (Signed)
No current treatment.  Check ANA, ESR, Sed rate.

## 2018-03-17 NOTE — Patient Instructions (Signed)
Psoriasis

## 2018-03-17 NOTE — Addendum Note (Signed)
Addended by: Kathrine Haddock on: 03/17/2018 01:28 PM   Modules accepted: Orders

## 2018-03-18 LAB — SEDIMENTATION RATE: Sed Rate: 19 mm/hr (ref 0–40)

## 2018-03-18 LAB — C-REACTIVE PROTEIN

## 2018-03-18 LAB — ANA W/REFLEX: Anti Nuclear Antibody(ANA): NEGATIVE

## 2018-04-24 ENCOUNTER — Other Ambulatory Visit: Payer: Self-pay | Admitting: Family Medicine

## 2018-04-24 NOTE — Telephone Encounter (Signed)
Acyclovir refill Last Refill:03/17/17 #60 with 3 refills Last OV: 03/17/18 Next OV: 05/08/18 PCP: Wynona Dove

## 2018-04-25 DIAGNOSIS — L858 Other specified epidermal thickening: Secondary | ICD-10-CM | POA: Diagnosis not present

## 2018-04-25 DIAGNOSIS — L308 Other specified dermatitis: Secondary | ICD-10-CM | POA: Diagnosis not present

## 2018-05-08 ENCOUNTER — Encounter: Payer: Self-pay | Admitting: Family Medicine

## 2018-05-08 ENCOUNTER — Ambulatory Visit: Payer: BLUE CROSS/BLUE SHIELD | Admitting: Family Medicine

## 2018-05-08 VITALS — BP 135/90 | HR 61 | Temp 98.4°F | Ht 67.5 in | Wt 268.4 lb

## 2018-05-08 DIAGNOSIS — F5101 Primary insomnia: Secondary | ICD-10-CM | POA: Diagnosis not present

## 2018-05-08 DIAGNOSIS — N760 Acute vaginitis: Secondary | ICD-10-CM

## 2018-05-08 DIAGNOSIS — E559 Vitamin D deficiency, unspecified: Secondary | ICD-10-CM | POA: Diagnosis not present

## 2018-05-08 DIAGNOSIS — I10 Essential (primary) hypertension: Secondary | ICD-10-CM

## 2018-05-08 DIAGNOSIS — B9689 Other specified bacterial agents as the cause of diseases classified elsewhere: Secondary | ICD-10-CM

## 2018-05-08 DIAGNOSIS — N898 Other specified noninflammatory disorders of vagina: Secondary | ICD-10-CM | POA: Diagnosis not present

## 2018-05-08 LAB — WET PREP FOR TRICH, YEAST, CLUE
Clue Cell Exam: POSITIVE — AB
Trichomonas Exam: NEGATIVE
YEAST EXAM: NEGATIVE

## 2018-05-08 MED ORDER — METRONIDAZOLE 500 MG PO TABS: 500.0000 mg | ORAL_TABLET | Freq: Two times a day (BID) | ORAL | 0 refills | Status: DC

## 2018-05-08 MED ORDER — VITAMIN D (ERGOCALCIFEROL) 1.25 MG (50000 UNIT) PO CAPS: 50000.0000 [IU] | ORAL_CAPSULE | ORAL | 3 refills | Status: DC

## 2018-05-08 MED ORDER — TRAZODONE HCL 50 MG PO TABS: 25.0000 mg | ORAL_TABLET | Freq: Every evening | ORAL | 1 refills | Status: DC | PRN

## 2018-05-08 NOTE — Assessment & Plan Note (Signed)
Will restart once weekly vit D, unable to remember daily OTC supplementation. Check levels at next visit.

## 2018-05-08 NOTE — Assessment & Plan Note (Signed)
Improved on trazodone, continue current regimen and good sleep hygiene

## 2018-05-08 NOTE — Progress Notes (Signed)
BP 135/90 (BP Location: Right Arm, Patient Position: Sitting, Cuff Size: Normal)   Pulse 61   Temp 98.4 F (36.9 C)   Ht 5' 7.5" (1.715 m)   Wt 268 lb 7 oz (121.8 kg)   SpO2 98%   BMI 41.42 kg/m    Subjective:    Patient ID: [name], female    DOB: February 10, 1961, 57 y.o.   MRN: [id number]  HPI: CHANNELLE BOTTGER is a 57 y.o. female  Chief Complaint  Patient presents with  . Insomnia   Here today for insomnia and vit d f/u. Doing very well on the trazodone, no daytime sedation and sleeping very well. Denies side effects. Notes she was taken off the once weekly vitamin D because she was taking it more than once a week, put onto daily OTC supplementation but has not been able to remember it very well. Taking maybe 1-2 weekly right now.   Also notes some thin white vaginal discharge and odor for the past week or so. No new products or recent swimming. Denies dysuria, abdominal pain, rashes, itching.   Past Medical History:  Diagnosis Date  . Boils   . Ear mass   . Hemorrhoids   . Herpes simplex virus infection   . HSV infection   . Lumbago with sciatica   . OA (osteoarthritis) of knee   . Obesity   . Overactive bladder   . PONV (postoperative nausea and vomiting)   . RA (rheumatoid arthritis) ([place])   . Sleep apnea   . Urge and stress incontinence    Social History   Socioeconomic History  . Marital status: Single    Spouse name: Not on file  . Number of children: Not on file  . Years of education: Not on file  . Highest education level: Not on file  Occupational History  . Not on file  Social Needs  . Financial resource strain: Not on file  . Food insecurity:    Worry: Not on file    Inability: Not on file  . Transportation needs:    Medical: Not on file    Non-medical: Not on file  Tobacco Use  . Smoking status: Never Smoker  . Smokeless tobacco: Never Used  Substance and Sexual Activity  . Alcohol use: No  . Drug use: No  . Sexual activity: Not on  file  Lifestyle  . Physical activity:    Days per week: Not on file    Minutes per session: Not on file  . Stress: Not on file  Relationships  . Social connections:    Talks on phone: Not on file    Gets together: Not on file    Attends religious service: Not on file    Active member of club or organization: Not on file    Attends meetings of clubs or organizations: Not on file    Relationship status: Not on file  . Intimate partner violence:    Fear of current or ex partner: Not on file    Emotionally abused: Not on file    Physically abused: Not on file    Forced sexual activity: Not on file  Other Topics Concern  . Not on file  Social History Narrative  . Not on file   Relevant past medical, surgical, family and social history reviewed and updated as indicated. Interim medical history since our last visit reviewed. Allergies and medications reviewed and updated.  Review of Systems  Per HPI unless  specifically indicated above     Objective:    BP 135/90 (BP Location: Right Arm, Patient Position: Sitting, Cuff Size: Normal)   Pulse 61   Temp 98.4 F (36.9 C)   Ht 5' 7.5" (1.715 m)   Wt 268 lb 7 oz (121.8 kg)   SpO2 98%   BMI 41.42 kg/m   Wt Readings from Last 3 Encounters:  05/08/18 268 lb 7 oz (121.8 kg)  03/17/18 271 lb (122.9 kg)  02/03/18 271 lb (122.9 kg)    Physical Exam  Constitutional: She is oriented to person, place, and time. She appears well-developed and well-nourished. No distress.  HENT:  Head: Atraumatic.  Eyes: Pupils are equal, round, and reactive to light. Conjunctivae and EOM are normal.  Neck: Normal range of motion. Neck supple.  Cardiovascular: Normal rate and regular rhythm.  Pulmonary/Chest: Effort normal and breath sounds normal.  Genitourinary: Vaginal discharge found.  Musculoskeletal: Normal range of motion.  Neurological: She is alert and oriented to person, place, and time.  Skin: Skin is warm and dry.  Psychiatric: She has a  normal mood and affect. Her behavior is normal.  Nursing note and vitals reviewed.   Results for orders placed or performed in visit on 03/17/18  ANA w/Reflex  Result Value Ref Range   Anti Nuclear Antibody(ANA) Negative Negative  Sed Rate (ESR)  Result Value Ref Range   Sed Rate 19 0 - 40 mm/hr  C-reactive protein  Result Value Ref Range   CRP <1 0 - 10 mg/L      Assessment & Plan:   Problem List Items Addressed This Visit      Cardiovascular and Mediastinum   Benign hypertension    Stable off medications, continue DASH diet and exercise. Check BPs as able and report abnormals        Other   Vitamin D deficiency - Primary    Will restart once weekly vit D, unable to remember daily OTC supplementation. Check levels at next visit.       Insomnia    Improved on trazodone, continue current regimen and good sleep hygiene        Other Visit Diagnoses    BV (bacterial vaginosis)       Wet prep +, tx with flagyl, probiotics. Good vaginal hygiene reviewed   Relevant Medications   metroNIDAZOLE (FLAGYL) 500 MG tablet   Other Relevant Orders   WET PREP FOR [place], YEAST, CLUE       Follow up plan: Return in about 6 months (around 11/07/2018) for Vit d, insomnia f/u.

## 2018-05-08 NOTE — Patient Instructions (Signed)
Follow up in 6 months 

## 2018-05-08 NOTE — Assessment & Plan Note (Signed)
Stable off medications, continue DASH diet and exercise. Check BPs as able and report abnormals

## 2018-05-18 ENCOUNTER — Other Ambulatory Visit: Payer: Self-pay | Admitting: Physician Assistant

## 2018-05-18 NOTE — Telephone Encounter (Signed)
Refill request approved for Zovirax, if worsening or ongoing issues patient requires visit.

## 2018-10-18 DIAGNOSIS — B351 Tinea unguium: Secondary | ICD-10-CM | POA: Diagnosis not present

## 2018-10-18 DIAGNOSIS — M79672 Pain in left foot: Secondary | ICD-10-CM | POA: Diagnosis not present

## 2018-10-18 DIAGNOSIS — M65872 Other synovitis and tenosynovitis, left ankle and foot: Secondary | ICD-10-CM | POA: Diagnosis not present

## 2018-11-02 ENCOUNTER — Telehealth: Payer: Self-pay | Admitting: Nurse Practitioner

## 2018-11-02 NOTE — Telephone Encounter (Signed)
Called pt to reschedule physical no answer left voicemail.

## 2018-11-10 ENCOUNTER — Encounter: Payer: BLUE CROSS/BLUE SHIELD | Admitting: Nurse Practitioner

## 2018-11-16 ENCOUNTER — Ambulatory Visit (INDEPENDENT_AMBULATORY_CARE_PROVIDER_SITE_OTHER): Payer: BLUE CROSS/BLUE SHIELD | Admitting: Family Medicine

## 2018-11-16 ENCOUNTER — Other Ambulatory Visit: Payer: Self-pay

## 2018-11-16 ENCOUNTER — Encounter: Payer: Self-pay | Admitting: Family Medicine

## 2018-11-16 VITALS — Ht 71.0 in | Wt 266.0 lb

## 2018-11-16 DIAGNOSIS — N898 Other specified noninflammatory disorders of vagina: Secondary | ICD-10-CM | POA: Diagnosis not present

## 2018-11-16 MED ORDER — METRONIDAZOLE 500 MG PO TABS: 500.0000 mg | ORAL_TABLET | Freq: Two times a day (BID) | ORAL | 0 refills | Status: DC

## 2018-11-16 NOTE — Progress Notes (Signed)
   Ht 5\' 11"  (1.803 m)   Wt 266 lb (120.7 kg)   BMI 37.10 kg/m    Subjective:    Patient ID: Erin Contreras, female    DOB: 05/08/1961, 58 y.o.   MRN: [id number]  HPI: Erin Contreras is a 58 y.o. female  Chief Complaint  Patient presents with  . Urinary Frequency    pt states having a little discharge. x 2-3 weeks ago    . This visit was completed via telephone due to the restrictions of the COVID-19 pandemic. All issues as above were discussed and addressed but no physical exam was performed. If it was felt that the patient should be evaluated in the office, they were directed there. The patient verbally consented to this visit. Patient was unable to complete an audio/visual visit due to Technical difficulties,Lack of internet. Due to the catastrophic nature of the COVID-19 pandemic, this visit was done through audio contact only. . Location of the patient: home . Location of the provider: work . Those involved with this call:  . Provider: Merrie Roof, PA-C . CMA: Lesle Chris, [place] . Front Desk/Registration: Jill Side  . Time spent on call: 15 minutes on the phone discussing health concerns. 5 minutes total spent in review of patient's record and preparation of their chart.   Having several weeks of urinary frequency and some vaginal discharge - thick clear/white discharge with some odor. Denies dysuria, abdominal pain, back pain, fevers, N/V/D. Not trying anything OTC. Hx of BV, states this feels consistent.   Relevant past medical, surgical, family and social history reviewed and updated as indicated. Interim medical history since our last visit reviewed. Allergies and medications reviewed and updated.  Review of Systems  Per HPI unless specifically indicated above     Objective:    Ht 5\' 11"  (1.803 m)   Wt 266 lb (120.7 kg)   BMI 37.10 kg/m   Wt Readings from Last 3 Encounters:  11/16/18 266 lb (120.7 kg)  05/08/18 268 lb 7 oz (121.8 kg)  03/17/18 271 lb  (122.9 kg)    Physical Exam  Results for orders placed or performed in visit on 05/08/18  WET PREP FOR TRICH, YEAST, CLUE  Result Value Ref Range   Trichomonas Exam Negative Negative   Yeast Exam Negative Negative   Clue Cell Exam Positive (A) Negative      Assessment & Plan:   Problem List Items Addressed This Visit    None    Visit Diagnoses    Vaginal discharge    -  Primary   Pt not comfortable coming in for labs during COVID 19 crisis, so tx based on sxs w/ flagyl. F/u for labs if not improving/worsening. Probiotics daily       Follow up plan: Return if symptoms worsen or fail to improve.

## 2018-12-05 DIAGNOSIS — M5416 Radiculopathy, lumbar region: Secondary | ICD-10-CM | POA: Diagnosis not present

## 2018-12-05 DIAGNOSIS — M1711 Unilateral primary osteoarthritis, right knee: Secondary | ICD-10-CM | POA: Diagnosis not present

## 2018-12-05 DIAGNOSIS — Z96651 Presence of right artificial knee joint: Secondary | ICD-10-CM | POA: Diagnosis not present

## 2019-01-10 ENCOUNTER — Other Ambulatory Visit: Payer: Self-pay | Admitting: Family Medicine

## 2019-01-10 NOTE — Telephone Encounter (Signed)
Please advise 

## 2019-02-09 ENCOUNTER — Encounter: Payer: BLUE CROSS/BLUE SHIELD | Admitting: Nurse Practitioner

## 2019-03-03 ENCOUNTER — Encounter: Payer: Self-pay | Admitting: Nurse Practitioner

## 2019-03-07 ENCOUNTER — Encounter: Payer: Self-pay | Admitting: Nurse Practitioner

## 2019-03-21 ENCOUNTER — Other Ambulatory Visit: Payer: Self-pay

## 2019-03-21 ENCOUNTER — Ambulatory Visit (INDEPENDENT_AMBULATORY_CARE_PROVIDER_SITE_OTHER): Payer: BC Managed Care – PPO | Admitting: Nurse Practitioner

## 2019-03-21 ENCOUNTER — Encounter: Payer: Self-pay | Admitting: Nurse Practitioner

## 2019-03-21 VITALS — BP 123/85 | HR 62 | Temp 98.7°F | Ht 68.0 in | Wt 272.0 lb

## 2019-03-21 DIAGNOSIS — I10 Essential (primary) hypertension: Secondary | ICD-10-CM

## 2019-03-21 DIAGNOSIS — Z6837 Body mass index (BMI) 37.0-37.9, adult: Secondary | ICD-10-CM

## 2019-03-21 DIAGNOSIS — E559 Vitamin D deficiency, unspecified: Secondary | ICD-10-CM

## 2019-03-21 DIAGNOSIS — G4733 Obstructive sleep apnea (adult) (pediatric): Secondary | ICD-10-CM

## 2019-03-21 DIAGNOSIS — R7303 Prediabetes: Secondary | ICD-10-CM

## 2019-03-21 DIAGNOSIS — F5101 Primary insomnia: Secondary | ICD-10-CM | POA: Diagnosis not present

## 2019-03-21 DIAGNOSIS — N898 Other specified noninflammatory disorders of vagina: Secondary | ICD-10-CM | POA: Insufficient documentation

## 2019-03-21 DIAGNOSIS — M545 Low back pain, unspecified: Secondary | ICD-10-CM

## 2019-03-21 DIAGNOSIS — G8929 Other chronic pain: Secondary | ICD-10-CM

## 2019-03-21 DIAGNOSIS — Z0000 Encounter for general adult medical examination without abnormal findings: Secondary | ICD-10-CM | POA: Diagnosis not present

## 2019-03-21 DIAGNOSIS — E6609 Other obesity due to excess calories: Secondary | ICD-10-CM

## 2019-03-21 DIAGNOSIS — E66812 Obesity, class 2: Secondary | ICD-10-CM

## 2019-03-21 DIAGNOSIS — Z1239 Encounter for other screening for malignant neoplasm of breast: Secondary | ICD-10-CM

## 2019-03-21 LAB — UA/M W/RFLX CULTURE, ROUTINE
Bilirubin, UA: NEGATIVE
Glucose, UA: NEGATIVE
Ketones, UA: NEGATIVE
Leukocytes,UA: NEGATIVE
Nitrite, UA: NEGATIVE
Protein,UA: NEGATIVE
RBC, UA: NEGATIVE
Specific Gravity, UA: >1.03 — ABNORMAL HIGH (ref 1.005–1.030)
Urobilinogen, Ur: 0.2 mg/dL (ref 0.2–1.0)
pH, UA: 5.5 (ref 5.0–7.5)

## 2019-03-21 LAB — WET PREP FOR TRICH, YEAST, CLUE
Clue Cell Exam: NEGATIVE
Trichomonas Exam: NEGATIVE
Yeast Exam: NEGATIVE

## 2019-03-21 MED ORDER — TRAZODONE HCL 100 MG PO TABS: 100.0000 mg | ORAL_TABLET | Freq: Every day | ORAL | 3 refills | Status: DC

## 2019-03-21 MED ORDER — MELOXICAM 15 MG PO TABS: 15.0000 mg | ORAL_TABLET | Freq: Every day | ORAL | 1 refills | Status: DC

## 2019-03-21 MED ORDER — ACYCLOVIR 400 MG PO TABS: 400.0000 mg | ORAL_TABLET | Freq: Two times a day (BID) | ORAL | 2 refills | Status: DC | PRN

## 2019-03-21 MED ORDER — CYCLOBENZAPRINE HCL 10 MG PO TABS: 10.0000 mg | ORAL_TABLET | Freq: Three times a day (TID) | ORAL | 0 refills | Status: DC | PRN

## 2019-03-21 MED ORDER — HYDROCORTISONE ACETATE 25 MG RE SUPP: 25.0000 mg | Freq: Two times a day (BID) | RECTAL | 0 refills | Status: DC

## 2019-03-21 NOTE — Assessment & Plan Note (Signed)
UA and wet prep negative.  Recommend good vaginal hygiene and to return for continued or worsening symptoms.

## 2019-03-21 NOTE — Assessment & Plan Note (Signed)
No current sciatic pain, more discomfort to lower back.  Will send in Flexeril to take as needed, instructed to take prior to sleep only.  Recommend alternating heat and ice, obtaining heat pad.  Continue Tylenol and Meloxicam as needed.  Return to office for worsening or continued symptoms.  If ongoing consider repeat imaging and possible PT.

## 2019-03-21 NOTE — Assessment & Plan Note (Signed)
Recommend continued focus on health diet choices and regular physical activity (30 minutes 5 days a week). 

## 2019-03-21 NOTE — Assessment & Plan Note (Signed)
Instructed to use CPAP consistently for overall health benefits.

## 2019-03-21 NOTE — Assessment & Plan Note (Signed)
Check A1C today and adjust plan of care as needed.

## 2019-03-21 NOTE — Assessment & Plan Note (Signed)
Continue supplement and check Vit D level today.

## 2019-03-21 NOTE — Assessment & Plan Note (Signed)
Ongoing and well controlled without medication.  BP at goal.  Continue diet focus and recommend regular exercise. 

## 2019-03-21 NOTE — Progress Notes (Signed)
BP 123/85   Pulse 62   Temp 98.7 F (37.1 C) (Oral)   Ht 5\' 8"  (1.727 m)   Wt 272 lb (123.4 kg)   SpO2 97%   BMI 41.36 kg/m    Subjective:    Patient ID: Erin Contreras, female    DOB: 06/23/1961, 58 y.o.   MRN: [id number]  HPI: Erin Contreras is a 58 y.o. female presenting on 03/21/2019 for comprehensive medical examination. Current medical complaints include: vaginal discharge  She currently lives with: daughter Menopausal Symptoms: no , LMP 15 years ago  HYPERTENSION No current medications. Hypertension status: stable  Satisfied with current treatment? yes Duration of hypertension: chronic BP monitoring frequency:  not checking Aspirin: no Recurrent headaches: no Visual changes: no Palpitations: no Dyspnea: no Chest pain: no Lower extremity edema: no Dizzy/lightheaded: no   VITAMIN D DEFICIENCY: Continues on daily supplement.  Denies any recent falls or fractures.    PREDIABETES: Lab in 2015 6.0% Hypoglycemic episodes:no Polydipsia/polyuria: no Visual disturbance: no Chest pain: no Paresthesias: no   INSOMNIA Continues on Trazodone, taking a whole tablet (50 MG) and reports this is not consistently helping.  For months she has had more difficulty sleeping again.   Has underlying sleep apnea, but endorses she has not been consistently using CPAP.  Has not used in 2 years.  Works day shift, 12 hours at [employer]. Duration: chronic Satisfied with sleep quality: no Difficulty falling asleep: no Difficulty staying asleep: yes Waking a few hours after sleep onset: yes Early morning awakenings: no Daytime hypersomnolence: yes Wakes feeling refreshed: yes Good sleep hygiene: no Apnea: no Snoring: no Depressed/anxious mood: no Recent stress: no Restless legs/nocturnal leg cramps: no Chronic pain/arthritis: no History of sleep study: yes Treatments attempted: Trazodone   BACK PAIN Has a history of imaging in 2014 showing mild degenerative changes to  lumbar.  Over past few weeks she states her back pain has been bothering her again.  Duration: weeks Mechanism of injury: unknown Location: low back Onset: gradual Severity: 8/10 Quality: dull and aching Frequency: intermittent Radiation: none Aggravating factors: standing for prolonged periods, lifting and bending Alleviating factors: NSAIDs Status: fluctuating Treatments attempted: ibuprofen  Relief with NSAIDs?: moderate Nighttime pain:  no Paresthesias / decreased sensation:  no Bowel / bladder incontinence:  no Fevers:  no Dysuria / urinary frequency:  no  VAGINAL DISCHARGE Last treated for vaginal discharge with Flagyl in April 2020.  Is not sure if it has to do with work and standing for long periods. Duration: weeks Discharge description: clear  Pruritus: no Dysuria: no Malodorous: no Urinary frequency: no Fevers: no Abdominal pain: no  Sexual activity: not sexually active History of sexually transmitted diseases: yes Recent antibiotic use: no Context: better  Treatments attempted: none   HEMORRHOID: Requesting cream for hemorrhoids.  States some pink when wipes and pruritus.   Depression Screen done today and results listed below:  Depression screen [facility] 2/9 12/30/2017 07/08/2017 07/08/2017  Decreased Interest 0 0 0  Down, Depressed, Hopeless 0 0 0  PHQ - 2 Score 0 0 0  Altered sleeping 0 - -  Tired, decreased energy 0 - -  Change in appetite 0 - -  Feeling bad or failure about yourself  0 - -  Trouble concentrating 0 - -  Moving slowly or fidgety/restless 0 - -  Suicidal thoughts 0 - -  PHQ-9 Score 0 - -  Difficult doing work/chores Not difficult at all - -  The patient does not have a history of falls. I did not complete a risk assessment for falls. A plan of care for falls was not documented.   Past Medical History:  Past Medical History:  Diagnosis Date  . Boils   . Ear mass   . Hemorrhoids   . Herpes simplex virus infection   . HSV  infection   . Lumbago with sciatica   . OA (osteoarthritis) of knee   . Obesity   . Overactive bladder   . PONV (postoperative nausea and vomiting)   . RA (rheumatoid arthritis) ([place])   . Sleep apnea   . Urge and stress incontinence     Surgical History:  Past Surgical History:  Procedure Laterality Date  . CHOLECYSTECTOMY  2011  . FRACTURE SURGERY Right    as a child  . KNEE ARTHROPLASTY Right 07/11/2017   Procedure: COMPUTER ASSISTED TOTAL KNEE ARTHROPLASTY;  Surgeon: Dereck Leep, MD;  Location: [facility] ORS;  Service: Orthopedics;  Laterality: Right;  . SLEEVE GASTROPLASTY    . TUMOR REMOVAL Right 2010   ear    Medications:  Current Outpatient Medications on File Prior to Visit  Medication Sig  . ibuprofen (ADVIL) 800 MG tablet   . VITAMIN D PO Take by mouth daily.   No current facility-administered medications on file prior to visit.     Allergies:  No Known Allergies  Social History:  Social History   Socioeconomic History  . Marital status: Single    Spouse name: Not on file  . Number of children: Not on file  . Years of education: Not on file  . Highest education level: Not on file  Occupational History  . Not on file  Social Needs  . Financial resource strain: Not on file  . Food insecurity    Worry: Not on file    Inability: Not on file  . Transportation needs    Medical: Not on file    Non-medical: Not on file  Tobacco Use  . Smoking status: Never Smoker  . Smokeless tobacco: Never Used  Substance and Sexual Activity  . Alcohol use: No  . Drug use: No  . Sexual activity: Not on file  Lifestyle  . Physical activity    Days per week: Not on file    Minutes per session: Not on file  . Stress: Not on file  Relationships  . Social Herbalist on phone: Not on file    Gets together: Not on file    Attends religious service: Not on file    Active member of club or organization: Not on file    Attends meetings of clubs or  organizations: Not on file    Relationship status: Not on file  . Intimate partner violence    Fear of current or ex partner: Not on file    Emotionally abused: Not on file    Physically abused: Not on file    Forced sexual activity: Not on file  Other Topics Concern  . Not on file  Social History Narrative  . Not on file   Social History   Tobacco Use  Smoking Status Never Smoker  Smokeless Tobacco Never Used   Social History   Substance and Sexual Activity  Alcohol Use No    Family History:  Family History  Problem Relation Age of Onset  . Cancer Mother        liver  . Diabetes Mother   .  Hyperlipidemia Mother   . Hypertension Mother   . Cirrhosis Father        liver  . Hypertension Daughter   . Stroke Maternal Grandmother   . Stroke Maternal Grandfather   . Heart disease Paternal Grandmother   . Diabetes Paternal Grandmother   . Heart disease Paternal Grandfather   . Diabetes Paternal Grandfather     Past medical history, surgical history, medications, allergies, family history and social history reviewed with patient today and changes made to appropriate areas of the chart.   Review of Systems - vaginal discharge All other ROS negative except what is listed above and in the HPI.      Objective:    BP 123/85   Pulse 62   Temp 98.7 F (37.1 C) (Oral)   Ht 5\' 8"  (1.727 m)   Wt 272 lb (123.4 kg)   SpO2 97%   BMI 41.36 kg/m   Wt Readings from Last 3 Encounters:  03/21/19 272 lb (123.4 kg)  11/16/18 266 lb (120.7 kg)  05/08/18 268 lb 7 oz (121.8 kg)    Physical Exam Vitals signs and nursing note reviewed.  Constitutional:      General: She is awake. She is not in acute distress.    Appearance: She is well-developed. She is morbidly obese. She is not ill-appearing.  HENT:     Head: Normocephalic.     Right Ear: Hearing, tympanic membrane, ear canal and external ear normal.     Left Ear: Hearing, tympanic membrane, ear canal and external ear  normal.     Nose: Nose normal.     Mouth/Throat:     Mouth: Mucous membranes are moist.     Pharynx: Oropharynx is clear. Uvula midline.  Eyes:     General: Lids are normal.        Right eye: No discharge.        Left eye: No discharge.     Extraocular Movements: Extraocular movements intact.     Conjunctiva/sclera: Conjunctivae normal.     Pupils: Pupils are equal, round, and reactive to light.     Visual Fields: Right eye visual fields normal and left eye visual fields normal.  Neck:     Musculoskeletal: Normal range of motion and neck supple.     Thyroid: No thyromegaly.     Vascular: No carotid bruit or JVD.  Cardiovascular:     Rate and Rhythm: Normal rate and regular rhythm.     Heart sounds: Normal heart sounds. No murmur. No gallop.   Pulmonary:     Effort: Pulmonary effort is normal. No accessory muscle usage or respiratory distress.     Breath sounds: Normal breath sounds.  Chest:     Breasts:        Right: Normal. No swelling, bleeding, inverted nipple, mass, nipple discharge, skin change or tenderness.        Left: Normal. No swelling, bleeding, inverted nipple, mass, nipple discharge, skin change or tenderness.  Abdominal:     General: Bowel sounds are normal.     Palpations: Abdomen is soft. There is no hepatomegaly or splenomegaly.     Tenderness: There is no abdominal tenderness.  Musculoskeletal:     Right lower leg: No edema.     Left lower leg: No edema.  Lymphadenopathy:     Cervical: No cervical adenopathy.     Upper Body:     Right upper body: No supraclavicular, axillary or pectoral adenopathy.  Left upper body: No supraclavicular, axillary or pectoral adenopathy.  Skin:    General: Skin is warm and dry.     Capillary Refill: Capillary refill takes less than 2 seconds.     Findings: No rash.  Neurological:     Mental Status: She is alert and oriented to person, place, and time.     Cranial Nerves: Cranial nerves are intact.     Gait: Gait is  intact.     Deep Tendon Reflexes: Reflexes are normal and symmetric.  Psychiatric:        Attention and Perception: Attention normal.        Mood and Affect: Mood normal.        Speech: Speech normal.        Behavior: Behavior normal. Behavior is cooperative.        Thought Content: Thought content normal.        Cognition and Memory: Cognition normal.        Judgment: Judgment normal.     Results for orders placed or performed in visit on 03/21/19  WET PREP FOR [place], YEAST, CLUE   Specimen: Vaginal Fluid   VAGINAL FLUI  Result Value Ref Range   Trichomonas Exam Negative Negative   Yeast Exam Negative Negative   Clue Cell Exam Negative Negative  UA/M w/rflx Culture, Routine   Specimen: Vaginal Fluid   VAGINAL FLUI  Result Value Ref Range   Specific Gravity, UA >1.030 (H) 1.005 - 1.030   pH, UA 5.5 5.0 - 7.5   Color, UA Yellow Yellow   Appearance Ur Clear Clear   Leukocytes,UA Negative Negative   Protein,UA Negative Negative/Trace   Glucose, UA Negative Negative   Ketones, UA Negative Negative   RBC, UA Negative Negative   Bilirubin, UA Negative Negative   Urobilinogen, Ur 0.2 0.2 - 1.0 mg/dL   Nitrite, UA Negative Negative      Assessment & Plan:   Problem List Items Addressed This Visit      Cardiovascular and Mediastinum   Benign hypertension    Ongoing and well controlled without medication.  BP at goal.  Continue diet focus and recommend regular exercise.        Respiratory   Obstructive sleep apnea of adult    Instructed to use CPAP consistently for overall health benefits.        Other   Obesity    Recommend continued focus on health diet choices and regular physical activity (30 minutes 5 days a week).      Low back pain    No current sciatic pain, more discomfort to lower back.  Will send in Flexeril to take as needed, instructed to take prior to sleep only.  Recommend alternating heat and ice, obtaining heat pad.  Continue Tylenol and Meloxicam  as needed.  Return to office for worsening or continued symptoms.  If ongoing consider repeat imaging and possible PT.      Relevant Medications   ibuprofen (ADVIL) 800 MG tablet   meloxicam (MOBIC) 15 MG tablet   cyclobenzaprine (FLEXERIL) 10 MG tablet   Vitamin D deficiency    Continue supplement and check Vit D level today.      Relevant Orders   VITAMIN D 25 Hydroxy (Vit-D Deficiency, Fractures)   Insomnia    Chronic, ongoing.  Will increase Trazodone to 100 MG at bedtime.  Recommend sleep hygiene techniques and meditation prior to sleep.  Return in 6 months or sooner if ongoing symptoms.  Prediabetes    Check A1C today and adjust plan of care as needed.      Relevant Orders   HgB A1c   Vaginal discharge    UA and wet prep negative.  Recommend good vaginal hygiene and to return for continued or worsening symptoms.      Relevant Orders   WET PREP FOR [place], [place] (Completed)   UA/M w/rflx Culture, Routine (Completed)    Other Visit Diagnoses    Encounter for annual physical exam    -  Primary   Relevant Orders   CBC with Differential/Platelet   Comprehensive metabolic panel   Lipid Panel w/o Chol/HDL Ratio   TSH   Breast cancer screening       Relevant Orders   MM DIGITAL SCREENING BILATERAL       Follow up plan: Return in about 6 months (around 09/21/2019) for Insomnia.   LABORATORY TESTING:  - Pap smear: up to date  IMMUNIZATIONS:   - Tdap: Tetanus vaccination status reviewed: last tetanus booster within 10 years. - Influenza: Up to date - Pneumovax: Not applicable - Prevnar: Not applicable - HPV: Not applicable - Zostavax vaccine: Refused  SCREENING: -Mammogram: Up to date  - Colonoscopy: Up to date  - Bone Density: Not applicable  -Hearing Test: Not applicable  -Spirometry: Not applicable   PATIENT COUNSELING:   Advised to take 1 mg of folate supplement per day if capable of pregnancy.   Sexuality: Discussed sexually transmitted  diseases, partner selection, use of condoms, avoidance of unintended pregnancy  and contraceptive alternatives.   Advised to avoid cigarette smoking.  I discussed with the patient that most people either abstain from alcohol or drink within safe limits (<=14/week and <=4 drinks/occasion for males, <=7/weeks and <= 3 drinks/occasion for females) and that the risk for alcohol disorders and other health effects rises proportionally with the number of drinks per week and how often a drinker exceeds daily limits.  Discussed cessation/primary prevention of drug use and availability of treatment for abuse.   Diet: Encouraged to adjust caloric intake to maintain  or achieve ideal body weight, to reduce intake of dietary saturated fat and total fat, to limit sodium intake by avoiding high sodium foods and not adding table salt, and to maintain adequate dietary potassium and calcium preferably from fresh fruits, vegetables, and low-fat dairy products.    stressed the importance of regular exercise  Injury prevention: Discussed safety belts, safety helmets, smoke detector, smoking near bedding or upholstery.   Dental health: Discussed importance of regular tooth brushing, flossing, and dental visits.    NEXT PREVENTATIVE PHYSICAL DUE IN 1 YEAR. Return in about 6 months (around 09/21/2019) for Insomnia.

## 2019-03-21 NOTE — Patient Instructions (Signed)

## 2019-03-21 NOTE — Assessment & Plan Note (Signed)
Chronic, ongoing.  Will increase Trazodone to 100 MG at bedtime.  Recommend sleep hygiene techniques and meditation prior to sleep.  Return in 6 months or sooner if ongoing symptoms.

## 2019-03-22 LAB — LIPID PANEL W/O CHOL/HDL RATIO
Cholesterol, Total: 206 mg/dL — ABNORMAL HIGH (ref 100–199)
HDL: 71 mg/dL (ref >39)
LDL Calculated: 112 mg/dL — ABNORMAL HIGH (ref 0–99)
Triglycerides: 113 mg/dL (ref 0–149)
VLDL Cholesterol Cal: 23 mg/dL (ref 5–40)

## 2019-03-22 LAB — COMPREHENSIVE METABOLIC PANEL
ALT: 21 IU/L (ref 0–32)
AST: 27 IU/L (ref 0–40)
Albumin/Globulin Ratio: 1.4 (ref 1.2–2.2)
Albumin: 4.2 g/dL (ref 3.8–4.9)
Alkaline Phosphatase: 92 IU/L (ref 39–117)
BUN/Creatinine Ratio: 19 (ref 9–23)
BUN: 14 mg/dL (ref 6–24)
Bilirubin Total: 0.4 mg/dL (ref 0.0–1.2)
CO2: 26 mmol/L (ref 20–29)
Calcium: 9.4 mg/dL (ref 8.7–10.2)
Chloride: 105 mmol/L (ref 96–106)
Creatinine, Ser: 0.74 mg/dL (ref 0.57–1.00)
GFR calc Af Amer: 103 mL/min/{1.73_m2} (ref >59)
GFR calc non Af Amer: 89 mL/min/{1.73_m2} (ref >59)
Globulin, Total: 2.9 g/dL (ref 1.5–4.5)
Glucose: 82 mg/dL (ref 65–99)
Potassium: 4.3 mmol/L (ref 3.5–5.2)
Sodium: 143 mmol/L (ref 134–144)
Total Protein: 7.1 g/dL (ref 6.0–8.5)

## 2019-03-22 LAB — HEMOGLOBIN A1C
Est. average glucose Bld gHb Est-mCnc: 123 mg/dL
Hgb A1c MFr Bld: 5.9 % — ABNORMAL HIGH (ref 4.8–5.6)

## 2019-03-22 LAB — CBC WITH DIFFERENTIAL/PLATELET
Basophils Absolute: 0.1 10*3/uL (ref 0.0–0.2)
Basos: 1 %
EOS (ABSOLUTE): 0.4 10*3/uL (ref 0.0–0.4)
Eos: 7 %
Hematocrit: 39.8 % (ref 34.0–46.6)
Hemoglobin: 13 g/dL (ref 11.1–15.9)
Immature Grans (Abs): 0 10*3/uL (ref 0.0–0.1)
Immature Granulocytes: 0 %
Lymphocytes Absolute: 1.5 10*3/uL (ref 0.7–3.1)
Lymphs: 24 %
MCH: 28.4 pg (ref 26.6–33.0)
MCHC: 32.7 g/dL (ref 31.5–35.7)
MCV: 87 fL (ref 79–97)
Monocytes Absolute: 0.4 10*3/uL (ref 0.1–0.9)
Monocytes: 7 %
Neutrophils Absolute: 3.8 10*3/uL (ref 1.4–7.0)
Neutrophils: 61 %
Platelets: 259 10*3/uL (ref 150–450)
RBC: 4.57 x10E6/uL (ref 3.77–5.28)
RDW: 13.1 % (ref 11.7–15.4)
WBC: 6.1 10*3/uL (ref 3.4–10.8)

## 2019-03-22 LAB — VITAMIN D 25 HYDROXY (VIT D DEFICIENCY, FRACTURES): Vit D, 25-Hydroxy: 16.6 ng/mL — ABNORMAL LOW (ref 30.0–100.0)

## 2019-03-22 LAB — TSH: TSH: 0.583 u[IU]/mL (ref 0.450–4.500)

## 2019-04-02 ENCOUNTER — Telehealth: Payer: Self-pay | Admitting: Nurse Practitioner

## 2019-04-02 NOTE — Telephone Encounter (Signed)
Called and left a detailed message for patient.  

## 2019-04-02 NOTE — Telephone Encounter (Signed)
Will discuss with patient.  She is also taking Flexeril as needed.

## 2019-04-02 NOTE — Telephone Encounter (Signed)
Copied from [place] [id number]. Topic: General - Other >> Apr 02, 2019 10:25 AM Pauline Good wrote: Reason for CRM: pt stated her sleep medication is causing her to have dry mouth. Please advise pt. You can leave a message if she don't answer

## 2019-06-12 ENCOUNTER — Other Ambulatory Visit: Payer: Self-pay | Admitting: Nurse Practitioner

## 2019-07-05 ENCOUNTER — Other Ambulatory Visit: Payer: Self-pay

## 2019-07-05 ENCOUNTER — Encounter: Payer: Self-pay | Admitting: Unknown Physician Specialty

## 2019-07-05 ENCOUNTER — Ambulatory Visit (INDEPENDENT_AMBULATORY_CARE_PROVIDER_SITE_OTHER): Payer: BC Managed Care – PPO | Admitting: Unknown Physician Specialty

## 2019-07-05 VITALS — Ht 69.0 in | Wt 268.0 lb

## 2019-07-05 DIAGNOSIS — U071 COVID-19: Secondary | ICD-10-CM

## 2019-07-05 MED ORDER — BENZONATATE 100 MG PO CAPS: 100.0000 mg | ORAL_CAPSULE | Freq: Two times a day (BID) | ORAL | 0 refills | Status: DC | PRN

## 2019-07-05 NOTE — Progress Notes (Signed)
Ht 5\' 9"  (1.753 m)   Wt 268 lb (121.6 kg)   BMI 39.58 kg/m    Subjective:    Patient ID: Erin Contreras, female    DOB: June 06, 1961, 58 y.o.   MRN: [id number]  HPI: Erin Contreras is a 58 y.o. female  Chief Complaint  Patient presents with  . Covid 19 exposure    positive resutls, per patietn  . Chills  . Diarrhea  . Shortness of Breath   Covid 19 Pt sates she got tested for Covid at work.  Pt states she has chills, diarrhea, SOB for about 5 days.  States she feel fine sitting up and tightness in chest when lying down.  She is wondering if she needs to be isolated from her daughter who also has symptoms.  Tylenol not really helping symptoms  Relevant past medical, surgical, family and social history reviewed and updated as indicated. Interim medical history since our last visit reviewed. Allergies and medications reviewed and updated.  Review of Systems  Constitutional: Positive for chills. Negative for fever.  HENT: Negative for congestion.   Respiratory: Positive for cough. Negative for wheezing and stridor.   Cardiovascular: Negative for chest pain and leg swelling.  Musculoskeletal: Positive for back pain.  Neurological: Negative.   Psychiatric/Behavioral: Negative.     Per HPI unless specifically indicated above     Objective:    Ht 5\' 9"  (1.753 m)   Wt 268 lb (121.6 kg)   BMI 39.58 kg/m   Wt Readings from Last 3 Encounters:  07/05/19 268 lb (121.6 kg)  03/21/19 272 lb (123.4 kg)  11/16/18 266 lb (120.7 kg)    Physical Exam Neurological:     Mental Status: She is alert.  Psychiatric:        Mood and Affect: Mood normal.        Behavior: Behavior normal.     Results for orders placed or performed in visit on 03/21/19  WET PREP FOR [place], YEAST, CLUE   Specimen: Vaginal Fluid   VAGINAL FLUI  Result Value Ref Range   Trichomonas Exam Negative Negative   Yeast Exam Negative Negative   Clue Cell Exam Negative Negative  CBC with  Differential/Platelet  Result Value Ref Range   WBC 6.1 3.4 - 10.8 x10E3/uL   RBC 4.57 3.77 - 5.28 x10E6/uL   Hemoglobin 13.0 11.1 - 15.9 g/dL   Hematocrit 39.8 34.0 - 46.6 %   MCV 87 79 - 97 fL   MCH 28.4 26.6 - 33.0 pg   MCHC 32.7 31.5 - 35.7 g/dL   RDW 13.1 11.7 - 15.4 %   Platelets 259 150 - 450 x10E3/uL   Neutrophils 61 Not Estab. %   Lymphs 24 Not Estab. %   Monocytes 7 Not Estab. %   Eos 7 Not Estab. %   Basos 1 Not Estab. %   Neutrophils Absolute 3.8 1.4 - 7.0 x10E3/uL   Lymphocytes Absolute 1.5 0.7 - 3.1 x10E3/uL   Monocytes Absolute 0.4 0.1 - 0.9 x10E3/uL   EOS (ABSOLUTE) 0.4 0.0 - 0.4 x10E3/uL   Basophils Absolute 0.1 0.0 - 0.2 x10E3/uL   Immature Granulocytes 0 Not Estab. %   Immature Grans (Abs) 0.0 0.0 - 0.1 x10E3/uL  Comprehensive metabolic panel  Result Value Ref Range   Glucose 82 65 - 99 mg/dL   BUN 14 6 - 24 mg/dL   Creatinine, Ser 0.74 0.57 - 1.00 mg/dL   GFR calc non Af Amer 89 >59  mL/min/1.73   GFR calc Af Amer 103 >59 mL/min/1.73   BUN/Creatinine Ratio 19 9 - 23   Sodium 143 134 - 144 mmol/L   Potassium 4.3 3.5 - 5.2 mmol/L   Chloride 105 96 - 106 mmol/L   CO2 26 20 - 29 mmol/L   Calcium 9.4 8.7 - 10.2 mg/dL   Total Protein 7.1 6.0 - 8.5 g/dL   Albumin 4.2 3.8 - 4.9 g/dL   Globulin, Total 2.9 1.5 - 4.5 g/dL   Albumin/Globulin Ratio 1.4 1.2 - 2.2   Bilirubin Total 0.4 0.0 - 1.2 mg/dL   Alkaline Phosphatase 92 39 - 117 IU/L   AST 27 0 - 40 IU/L   ALT 21 0 - 32 IU/L  Lipid Panel w/o Chol/HDL Ratio  Result Value Ref Range   Cholesterol, Total 206 (H) 100 - 199 mg/dL   Triglycerides 113 0 - 149 mg/dL   HDL 71 >39 mg/dL   VLDL Cholesterol Cal 23 5 - 40 mg/dL   LDL Calculated 112 (H) 0 - 99 mg/dL  TSH  Result Value Ref Range   TSH 0.583 0.450 - 4.500 uIU/mL  VITAMIN D 25 Hydroxy (Vit-D Deficiency, Fractures)  Result Value Ref Range   Vit D, 25-Hydroxy 16.6 (L) 30.0 - 100.0 ng/mL  HgB A1c  Result Value Ref Range   Hgb A1c MFr Bld 5.9 (H) 4.8 -  5.6 %   Est. average glucose Bld gHb Est-mCnc 123 mg/dL  UA/M w/rflx Culture, Routine   Specimen: Vaginal Fluid   VAGINAL FLUI  Result Value Ref Range   Specific Gravity, UA >1.030 (H) 1.005 - 1.030   pH, UA 5.5 5.0 - 7.5   Color, UA Yellow Yellow   Appearance Ur Clear Clear   Leukocytes,UA Negative Negative   Protein,UA Negative Negative/Trace   Glucose, UA Negative Negative   Ketones, UA Negative Negative   RBC, UA Negative Negative   Bilirubin, UA Negative Negative   Urobilinogen, Ur 0.2 0.2 - 1.0 mg/dL   Nitrite, UA Negative Negative      Assessment & Plan:   Problem List Items Addressed This Visit    None    Visit Diagnoses    COVID-19    -  Primary   Pt without significant SOB.  Instructed to go to ER if DOE.  Recommended OTC Ibuprofen, Tessalon Perles.  Isolate 10 days after onset of symptoms.        Recommended Vitamin D, Vitamin C, and Zinc supplements.    Daughter lives with her and also positive.  Discussed no need to isolate from each other with both positive but she and daughter needs to quarantine together.    Pt high risk, but not interested in mono-clonal antibody treatment  Follow up plan: Return if symptoms worsen or fail to improve.   . This visit was completed via telephone due to the restrictions of the COVID-19 pandemic. All issues as above were discussed and addressed but no physical exam was performed. If it was felt that the patient should be evaluated in the office, they were directed there. The patient verbally consented to this visit. Patient was unable to complete an audio/visual visit due to {Blank single:19197::"Technical difficulties,Lack of internet . Location of the patient: home . Location of the provider: work . Those involved with this call:  . Provider: Kathrine Haddock, DNP . CMA: Tiffany Reel, CMA . Front Desk/Registration: [facility]  . Time spent on call: 15 minutes on the phone discussing health concerns.  5 minutes total  spent in review of patient's record and preparation of their chart.

## 2019-07-13 ENCOUNTER — Ambulatory Visit (INDEPENDENT_AMBULATORY_CARE_PROVIDER_SITE_OTHER): Payer: BC Managed Care – PPO | Admitting: Family Medicine

## 2019-07-13 ENCOUNTER — Encounter: Payer: Self-pay | Admitting: Family Medicine

## 2019-07-13 ENCOUNTER — Other Ambulatory Visit: Payer: Self-pay

## 2019-07-13 VITALS — Wt 268.0 lb

## 2019-07-13 DIAGNOSIS — R52 Pain, unspecified: Secondary | ICD-10-CM | POA: Diagnosis not present

## 2019-07-13 MED ORDER — PREDNISONE 20 MG PO TABS: 40.0000 mg | ORAL_TABLET | Freq: Every day | ORAL | 0 refills | Status: DC

## 2019-07-13 MED ORDER — CYCLOBENZAPRINE HCL 5 MG PO TABS: 5.0000 mg | ORAL_TABLET | Freq: Three times a day (TID) | ORAL | 0 refills | Status: DC | PRN

## 2019-07-13 NOTE — Progress Notes (Signed)
Wt 268 lb (121.6 kg)   BMI 39.58 kg/m    Subjective:    Patient ID: Erin Contreras, female    DOB: Aug 19, 1960, 58 y.o.   MRN: [id number]  HPI: Erin Contreras is a 58 y.o. female  Chief Complaint  Patient presents with  . Back Pain    lower. x a week     . This visit was completed via telephone due to the restrictions of the COVID-19 pandemic. All issues as above were discussed and addressed. Physical exam was done as above through visual confirmation on telephone. If it was felt that the patient should be evaluated in the office, they were directed there. The patient verbally consented to this visit. . Location of the patient: home . Location of the provider: work . Those involved with this call:  . Provider: Merrie Roof, PA-C . CMA: Lesle Chris, [place] . Front Desk/Registration: Jill [facility]  . Time spent on call: 15 minutes on the phone discussing health concerns. 5 minutes total spent in review of patient's record and preparation of their chart. I verified patient identity using two factors (patient name and date of birth). Patient consents verbally to being seen via telemedicine visit today.   Is COVID positive and feels better overall from that but still having significant back aches. Wonders if she's laying in the bed too much while sick. Taking tylenol and ibuprofen without much relief. Feeling stiff, sore but no extremity weakness, numbness or tingling, urinary sxs, abdominal pain, N/V/D constipation, new injuries.   Relevant past medical, surgical, family and social history reviewed and updated as indicated. Interim medical history since our last visit reviewed. Allergies and medications reviewed and updated.  Review of Systems  Per HPI unless specifically indicated above     Objective:    Wt 268 lb (121.6 kg)   BMI 39.58 kg/m   Wt Readings from Last 3 Encounters:  07/13/19 268 lb (121.6 kg)  07/05/19 268 lb (121.6 kg)  03/21/19 272 lb (123.4 kg)    Physical  Exam  Unable to perform PE due to patient lack of access to video technology for today's visit.   Results for orders placed or performed in visit on 03/21/19  WET PREP FOR [place], YEAST, CLUE   Specimen: Vaginal Fluid   VAGINAL FLUI  Result Value Ref Range   Trichomonas Exam Negative Negative   Yeast Exam Negative Negative   Clue Cell Exam Negative Negative  CBC with Differential/Platelet  Result Value Ref Range   WBC 6.1 3.4 - 10.8 x10E3/uL   RBC 4.57 3.77 - 5.28 x10E6/uL   Hemoglobin 13.0 11.1 - 15.9 g/dL   Hematocrit 39.8 34.0 - 46.6 %   MCV 87 79 - 97 fL   MCH 28.4 26.6 - 33.0 pg   MCHC 32.7 31.5 - 35.7 g/dL   RDW 13.1 11.7 - 15.4 %   Platelets 259 150 - 450 x10E3/uL   Neutrophils 61 Not Estab. %   Lymphs 24 Not Estab. %   Monocytes 7 Not Estab. %   Eos 7 Not Estab. %   Basos 1 Not Estab. %   Neutrophils Absolute 3.8 1.4 - 7.0 x10E3/uL   Lymphocytes Absolute 1.5 0.7 - 3.1 x10E3/uL   Monocytes Absolute 0.4 0.1 - 0.9 x10E3/uL   EOS (ABSOLUTE) 0.4 0.0 - 0.4 x10E3/uL   Basophils Absolute 0.1 0.0 - 0.2 x10E3/uL   Immature Granulocytes 0 Not Estab. %   Immature Grans (Abs) 0.0 0.0 - 0.1  x10E3/uL  Comprehensive metabolic panel  Result Value Ref Range   Glucose 82 65 - 99 mg/dL   BUN 14 6 - 24 mg/dL   Creatinine, Ser 0.74 0.57 - 1.00 mg/dL   GFR calc non Af Amer 89 >59 mL/min/1.73   GFR calc Af Amer 103 >59 mL/min/1.73   BUN/Creatinine Ratio 19 9 - 23   Sodium 143 134 - 144 mmol/L   Potassium 4.3 3.5 - 5.2 mmol/L   Chloride 105 96 - 106 mmol/L   CO2 26 20 - 29 mmol/L   Calcium 9.4 8.7 - 10.2 mg/dL   Total Protein 7.1 6.0 - 8.5 g/dL   Albumin 4.2 3.8 - 4.9 g/dL   Globulin, Total 2.9 1.5 - 4.5 g/dL   Albumin/Globulin Ratio 1.4 1.2 - 2.2   Bilirubin Total 0.4 0.0 - 1.2 mg/dL   Alkaline Phosphatase 92 39 - 117 IU/L   AST 27 0 - 40 IU/L   ALT 21 0 - 32 IU/L  Lipid Panel w/o Chol/HDL Ratio  Result Value Ref Range   Cholesterol, Total 206 (H) 100 - 199 mg/dL    Triglycerides 113 0 - 149 mg/dL   HDL 71 >39 mg/dL   VLDL Cholesterol Cal 23 5 - 40 mg/dL   LDL Calculated 112 (H) 0 - 99 mg/dL  TSH  Result Value Ref Range   TSH 0.583 0.450 - 4.500 uIU/mL  VITAMIN D 25 Hydroxy (Vit-D Deficiency, Fractures)  Result Value Ref Range   Vit D, 25-Hydroxy 16.6 (L) 30.0 - 100.0 ng/mL  HgB A1c  Result Value Ref Range   Hgb A1c MFr Bld 5.9 (H) 4.8 - 5.6 %   Est. average glucose Bld gHb Est-mCnc 123 mg/dL  UA/M w/rflx Culture, Routine   Specimen: Vaginal Fluid   VAGINAL FLUI  Result Value Ref Range   Specific Gravity, UA >1.030 (H) 1.005 - 1.030   pH, UA 5.5 5.0 - 7.5   Color, UA Yellow Yellow   Appearance Ur Clear Clear   Leukocytes,UA Negative Negative   Protein,UA Negative Negative/Trace   Glucose, UA Negative Negative   Ketones, UA Negative Negative   RBC, UA Negative Negative   Bilirubin, UA Negative Negative   Urobilinogen, Ur 0.2 0.2 - 1.0 mg/dL   Nitrite, UA Negative Negative      Assessment & Plan:   Problem List Items Addressed This Visit    None    Visit Diagnoses    Generalized body aches    -  Primary   Suspect from sedentary behaviors during COVID and illness itself. Prednisone, low dose flexeril, heating pads. Advance activity as tolerated. F/u if worsening       Follow up plan: Return if symptoms worsen or fail to improve.

## 2019-07-28 DIAGNOSIS — M5441 Lumbago with sciatica, right side: Secondary | ICD-10-CM | POA: Diagnosis not present

## 2019-07-28 DIAGNOSIS — M5136 Other intervertebral disc degeneration, lumbar region: Secondary | ICD-10-CM | POA: Diagnosis not present

## 2019-07-29 DIAGNOSIS — M545 Low back pain: Secondary | ICD-10-CM | POA: Diagnosis not present

## 2019-07-29 DIAGNOSIS — M79604 Pain in right leg: Secondary | ICD-10-CM | POA: Diagnosis not present

## 2019-07-29 DIAGNOSIS — N390 Urinary tract infection, site not specified: Secondary | ICD-10-CM | POA: Diagnosis not present

## 2019-07-29 DIAGNOSIS — M069 Rheumatoid arthritis, unspecified: Secondary | ICD-10-CM | POA: Diagnosis not present

## 2019-07-29 DIAGNOSIS — Z79899 Other long term (current) drug therapy: Secondary | ICD-10-CM | POA: Diagnosis not present

## 2019-07-29 DIAGNOSIS — I10 Essential (primary) hypertension: Secondary | ICD-10-CM | POA: Diagnosis not present

## 2019-07-29 DIAGNOSIS — R319 Hematuria, unspecified: Secondary | ICD-10-CM | POA: Diagnosis not present

## 2019-07-29 DIAGNOSIS — R350 Frequency of micturition: Secondary | ICD-10-CM | POA: Diagnosis not present

## 2019-07-29 DIAGNOSIS — Z791 Long term (current) use of non-steroidal anti-inflammatories (NSAID): Secondary | ICD-10-CM | POA: Diagnosis not present

## 2019-07-29 DIAGNOSIS — E669 Obesity, unspecified: Secondary | ICD-10-CM | POA: Diagnosis not present

## 2019-09-10 DIAGNOSIS — M2550 Pain in unspecified joint: Secondary | ICD-10-CM | POA: Diagnosis not present

## 2019-09-13 ENCOUNTER — Other Ambulatory Visit: Payer: Self-pay

## 2019-09-13 ENCOUNTER — Ambulatory Visit (INDEPENDENT_AMBULATORY_CARE_PROVIDER_SITE_OTHER): Payer: Self-pay | Admitting: Nurse Practitioner

## 2019-09-13 ENCOUNTER — Encounter: Payer: Self-pay | Admitting: Nurse Practitioner

## 2019-09-13 DIAGNOSIS — U071 COVID-19: Secondary | ICD-10-CM | POA: Insufficient documentation

## 2019-09-13 DIAGNOSIS — M5442 Lumbago with sciatica, left side: Secondary | ICD-10-CM

## 2019-09-13 DIAGNOSIS — G8929 Other chronic pain: Secondary | ICD-10-CM

## 2019-09-13 MED ORDER — ALBUTEROL SULFATE HFA 108 (90 BASE) MCG/ACT IN AERS: 2.0000 | INHALATION_SPRAY | Freq: Four times a day (QID) | RESPIRATORY_TRACT | 1 refills | Status: DC | PRN

## 2019-09-13 MED ORDER — BENZONATATE 100 MG PO CAPS: 100.0000 mg | ORAL_CAPSULE | Freq: Two times a day (BID) | ORAL | 0 refills | Status: DC | PRN

## 2019-09-13 MED ORDER — PREDNISONE 10 MG PO TABS: ORAL_TABLET | ORAL | 0 refills | Status: DC

## 2019-09-13 NOTE — Progress Notes (Signed)
There were no vitals taken for this visit.   Subjective:    Patient ID: Erin Contreras, female    DOB: Jun 28, 1961, 59 y.o.   MRN: [id number]  HPI: Erin Contreras is a 59 y.o. female  Chief Complaint  Patient presents with  . Pain    pt states she has been having left lower back and hip pain for 3 weeks. States it is running down into her leg as well.   . Cough    states she has a cough that is getting better, has been taking cough syrup    . This visit was completed via telephone due to the restrictions of the COVID-19 pandemic. All issues as above were discussed and addressed but no physical exam was performed. If it was felt that the patient should be evaluated in the office, they were directed there. The patient verbally consented to this visit. Patient was unable to complete an audio/visual visit due to Lack of equipment. Due to the catastrophic nature of the COVID-19 pandemic, this visit was done through audio contact only. . Location of the patient: home . Location of the provider: work . Those involved with this call:  . Provider: Marnee Guarneri, DNP . CMA: Yvonna Alanis, CMA . Front Desk/Registration: Don Perking  . Time spent on call: 15 minutes on the phone discussing health concerns. 10 minutes total spent in review of patient's record and preparation of their chart.  . I verified patient identity using two factors (patient name and date of birth). Patient consents verbally to being seen via telemedicine visit today.    BACK PAIN Has been present for 3 weeks ago.  Right lower back and this is radiating down into her hip and right leg.  Does a lot of walking at work and is having to shift weight a lot.  Can not think of anything that initiated this.  Has had similar symptoms before, was shown exercises by NP at her work which is not helping.  Is having knee pain in left side and was given pain medicine by NP at her work, was given Meloxicam + Tramadol and told to  take Tylenol arthritis.  Has history of right knee replacement 2 years ago.  In 2014 had imaging of lower back noting mild degenerative changes. Duration: weeks Mechanism of injury: unknown Location: Left and low back Onset: gradual Severity: 8/10 Quality: sharp, burning and shooting Frequency: intermittent Radiation: buttocks and L leg below the knee Aggravating factors: movement, walking and bending Alleviating factors: laying, NSAIDs and APAP Status: fluctuating Treatments attempted: Meloxicam and APAP  Relief with NSAIDs?: moderate Nighttime pain:  no Paresthesias / decreased sensation:  no Bowel / bladder incontinence:  no Fevers:  no Dysuria / urinary frequency:  no   COUGH Tested positive for Covid 1st of December, continues to have mild, ongoing cough.  Although this is improving.  She reports when going to work people look at her if she coughs and would like something to help this mild cough that is left.  Denies SOB, fever, orthopnea, diaphoresis, or N&V. Duration: months Circumstances of initial development of cough: Covid Cough severity: mild Cough description: non-productive and hacking Aggravating factors:  talking and exercise Alleviating factors: cough syrup Status:  better Treatments attempted: cough syrup Wheezing: no Shortness of breath: no Chest pain: no Chest tightness:no Nasal congestion: no Runny nose: no Postnasal drip: no Frequent throat clearing or swallowing: no Hemoptysis: no Fevers: no Night sweats: no Weight loss: no  Heartburn: no Recent foreign travel: no Tuberculosis contacts: no  Relevant past medical, surgical, family and social history reviewed and updated as indicated. Interim medical history since our last visit reviewed. Allergies and medications reviewed and updated.  Review of Systems  Constitutional: Negative for activity change, appetite change, diaphoresis, fatigue and fever.  HENT: Negative.   Respiratory: Positive for  cough. Negative for chest tightness, shortness of breath and wheezing.   Cardiovascular: Negative for chest pain, palpitations and leg swelling.  Gastrointestinal: Negative.   Musculoskeletal: Positive for back pain.  Neurological: Negative.   Psychiatric/Behavioral: Negative.     Per HPI unless specifically indicated above     Objective:    There were no vitals taken for this visit.  Wt Readings from Last 3 Encounters:  07/13/19 268 lb (121.6 kg)  07/05/19 268 lb (121.6 kg)  03/21/19 272 lb (123.4 kg)    Physical Exam   Unable to perform due to telephone visit only.  Results for orders placed or performed in visit on 03/21/19  WET PREP FOR [place], YEAST, CLUE   Specimen: Vaginal Fluid   VAGINAL FLUI  Result Value Ref Range   Trichomonas Exam Negative Negative   Yeast Exam Negative Negative   Clue Cell Exam Negative Negative  CBC with Differential/Platelet  Result Value Ref Range   WBC 6.1 3.4 - 10.8 x10E3/uL   RBC 4.57 3.77 - 5.28 x10E6/uL   Hemoglobin 13.0 11.1 - 15.9 g/dL   Hematocrit 39.8 34.0 - 46.6 %   MCV 87 79 - 97 fL   MCH 28.4 26.6 - 33.0 pg   MCHC 32.7 31.5 - 35.7 g/dL   RDW 13.1 11.7 - 15.4 %   Platelets 259 150 - 450 x10E3/uL   Neutrophils 61 Not Estab. %   Lymphs 24 Not Estab. %   Monocytes 7 Not Estab. %   Eos 7 Not Estab. %   Basos 1 Not Estab. %   Neutrophils Absolute 3.8 1.4 - 7.0 x10E3/uL   Lymphocytes Absolute 1.5 0.7 - 3.1 x10E3/uL   Monocytes Absolute 0.4 0.1 - 0.9 x10E3/uL   EOS (ABSOLUTE) 0.4 0.0 - 0.4 x10E3/uL   Basophils Absolute 0.1 0.0 - 0.2 x10E3/uL   Immature Granulocytes 0 Not Estab. %   Immature Grans (Abs) 0.0 0.0 - 0.1 x10E3/uL  Comprehensive metabolic panel  Result Value Ref Range   Glucose 82 65 - 99 mg/dL   BUN 14 6 - 24 mg/dL   Creatinine, Ser 0.74 0.57 - 1.00 mg/dL   GFR calc non Af Amer 89 >59 mL/min/1.73   GFR calc Af Amer 103 >59 mL/min/1.73   BUN/Creatinine Ratio 19 9 - 23   Sodium 143 134 - 144 mmol/L    Potassium 4.3 3.5 - 5.2 mmol/L   Chloride 105 96 - 106 mmol/L   CO2 26 20 - 29 mmol/L   Calcium 9.4 8.7 - 10.2 mg/dL   Total Protein 7.1 6.0 - 8.5 g/dL   Albumin 4.2 3.8 - 4.9 g/dL   Globulin, Total 2.9 1.5 - 4.5 g/dL   Albumin/Globulin Ratio 1.4 1.2 - 2.2   Bilirubin Total 0.4 0.0 - 1.2 mg/dL   Alkaline Phosphatase 92 39 - 117 IU/L   AST 27 0 - 40 IU/L   ALT 21 0 - 32 IU/L  Lipid Panel w/o Chol/HDL Ratio  Result Value Ref Range   Cholesterol, Total 206 (H) 100 - 199 mg/dL   Triglycerides 113 0 - 149 mg/dL   HDL 71 >39  mg/dL   VLDL Cholesterol Cal 23 5 - 40 mg/dL   LDL Calculated 112 (H) 0 - 99 mg/dL  TSH  Result Value Ref Range   TSH 0.583 0.450 - 4.500 uIU/mL  VITAMIN D 25 Hydroxy (Vit-D Deficiency, Fractures)  Result Value Ref Range   Vit D, 25-Hydroxy 16.6 (L) 30.0 - 100.0 ng/mL  HgB A1c  Result Value Ref Range   Hgb A1c MFr Bld 5.9 (H) 4.8 - 5.6 %   Est. average glucose Bld gHb Est-mCnc 123 mg/dL  UA/M w/rflx Culture, Routine   Specimen: Vaginal Fluid   VAGINAL FLUI  Result Value Ref Range   Specific Gravity, UA >1.030 (H) 1.005 - 1.030   pH, UA 5.5 5.0 - 7.5   Color, UA Yellow Yellow   Appearance Ur Clear Clear   Leukocytes,UA Negative Negative   Protein,UA Negative Negative/Trace   Glucose, UA Negative Negative   Ketones, UA Negative Negative   RBC, UA Negative Negative   Bilirubin, UA Negative Negative   Urobilinogen, Ur 0.2 0.2 - 1.0 mg/dL   Nitrite, UA Negative Negative      Assessment & Plan:   Problem List Items Addressed This Visit      Other   Low back pain - Primary    Chronic with acute flare.  Will repeat imaging of lower back, order placed.  Script for Prednisone taper sent in.  Recommend minimal use of Meloxicam and Tramadol, may use Tylenol as needed for discomfort.  Recommend use of heat/ice + OTC creams like Voltaren gel or Lidocaine patches.  If ongoing discussed with her and will send to PT if no relief after Prednisone taper.  Return to  office for worsening or ongoing issues.      Relevant Medications   traMADol (ULTRAM) 50 MG tablet   acetaminophen (TYLENOL 8 HOUR ARTHRITIS PAIN) 650 MG CR tablet   predniSONE (DELTASONE) 10 MG tablet   Other Relevant Orders   DG Lumbar Spine Complete   Lab test positive for detection of COVID-19 virus    Ongoing cough since her diagnosis beginning of December, although cough is improving per her report.  Will send in Tessalon PRN and Albuterol inhaler to use as needed.  Recommend continue increased hydration and rest.  Discussed Covid and effects.  If ongoing will see in office for face to face, auscultation of lungs and consider imaging.  Return for worsening or ongoing symptoms.         I discussed the assessment and treatment plan with the patient. The patient was provided an opportunity to ask questions and all were answered. The patient agreed with the plan and demonstrated an understanding of the instructions.   The patient was advised to call back or seek an in-person evaluation if the symptoms worsen or if the condition fails to improve as anticipated.   I provided 15 minutes of time during this encounter.  Follow up plan: Return if symptoms worsen or fail to improve.

## 2019-09-13 NOTE — Assessment & Plan Note (Signed)
Chronic with acute flare.  Will repeat imaging of lower back, order placed.  Script for Prednisone taper sent in.  Recommend minimal use of Meloxicam and Tramadol, may use Tylenol as needed for discomfort.  Recommend use of heat/ice + OTC creams like Voltaren gel or Lidocaine patches.  If ongoing discussed with her and will send to PT if no relief after Prednisone taper.  Return to office for worsening or ongoing issues.

## 2019-09-13 NOTE — Patient Instructions (Signed)
Acute Back Pain, Adult Acute back pain is sudden and usually short-lived. It is often caused by an injury to the muscles and tissues in the back. The injury may result from:  A muscle or ligament getting overstretched or torn (strained). Ligaments are tissues that connect bones to each other. Lifting something improperly can cause a back strain.  Wear and tear (degeneration) of the spinal disks. Spinal disks are circular tissue that provides cushioning between the bones of the spine (vertebrae).  Twisting motions, such as while playing sports or doing yard work.  A hit to the back.  Arthritis. You may have a physical exam, lab tests, and imaging tests to find the cause of your pain. Acute back pain usually goes away with rest and home care. Follow these instructions at home: Managing pain, stiffness, and swelling  Take over-the-counter and prescription medicines only as told by your health care provider.  Your health care provider may recommend applying ice during the first 24-48 hours after your pain starts. To do this: ? Put ice in a plastic bag. ? Place a towel between your skin and the bag. ? Leave the ice on for 20 minutes, 2-3 times a day.  If directed, apply heat to the affected area as often as told by your health care provider. Use the heat source that your health care provider recommends, such as a moist heat pack or a heating pad. ? Place a towel between your skin and the heat source. ? Leave the heat on for 20-30 minutes. ? Remove the heat if your skin turns bright red. This is especially important if you are unable to feel pain, heat, or cold. You have a greater risk of getting burned. Activity   Do not stay in bed. Staying in bed for more than 1-2 days can delay your recovery.  Sit up and stand up straight. Avoid leaning forward when you sit, or hunching over when you stand. ? If you work at a desk, sit close to it so you do not need to lean over. Keep your chin tucked  in. Keep your neck drawn back, and keep your elbows bent at a right angle. Your arms should look like the letter "L." ? Sit high and close to the steering wheel when you drive. Add lower back (lumbar) support to your car seat, if needed.  Take short walks on even surfaces as soon as you are able. Try to increase the length of time you walk each day.  Do not sit, drive, or stand in one place for more than 30 minutes at a time. Sitting or standing for long periods of time can put stress on your back.  Do not drive or use heavy machinery while taking prescription pain medicine.  Use proper lifting techniques. When you bend and lift, use positions that put less stress on your back: ? Bend your knees. ? Keep the load close to your body. ? Avoid twisting.  Exercise regularly as told by your health care provider. Exercising helps your back heal faster and helps prevent back injuries by keeping muscles strong and flexible.  Work with a physical therapist to make a safe exercise program, as recommended by your health care provider. Do any exercises as told by your physical therapist. Lifestyle  Maintain a healthy weight. Extra weight puts stress on your back and makes it difficult to have good posture.  Avoid activities or situations that make you feel anxious or stressed. Stress and anxiety increase muscle   tension and can make back pain worse. Learn ways to manage anxiety and stress, such as through exercise. General instructions  Sleep on a firm mattress in a comfortable position. Try lying on your side with your knees slightly bent. If you lie on your back, put a pillow under your knees.  Follow your treatment plan as told by your health care provider. This may include: ? Cognitive or behavioral therapy. ? Acupuncture or massage therapy. ? Meditation or yoga. Contact a health care provider if:  You have pain that is not relieved with rest or medicine.  You have increasing pain going down  into your legs or buttocks.  Your pain does not improve after 2 weeks.  You have pain at night.  You lose weight without trying.  You have a fever or chills. Get help right away if:  You develop new bowel or bladder control problems.  You have unusual weakness or numbness in your arms or legs.  You develop nausea or vomiting.  You develop abdominal pain.  You feel faint. Summary  Acute back pain is sudden and usually short-lived.  Use proper lifting techniques. When you bend and lift, use positions that put less stress on your back.  Take over-the-counter and prescription medicines and apply heat or ice as directed by your health care provider. This information is not intended to replace advice given to you by your health care provider. Make sure you discuss any questions you have with your health care provider. Document Revised: 11/07/2018 Document Reviewed: 03/02/2017 Elsevier Patient Education  2020 [employer]  

## 2019-09-13 NOTE — Assessment & Plan Note (Signed)
Ongoing cough since her diagnosis beginning of December, although cough is improving per her report.  Will send in Tessalon PRN and Albuterol inhaler to use as needed.  Recommend continue increased hydration and rest.  Discussed Covid and effects.  If ongoing will see in office for face to face, auscultation of lungs and consider imaging.  Return for worsening or ongoing symptoms.

## 2019-09-17 ENCOUNTER — Ambulatory Visit
Admission: RE | Admit: 2019-09-17 | Discharge: 2019-09-17 | Disposition: A | Discharge status: Home / Self Care | Payer: BC Managed Care – PPO | Source: Ambulatory Visit | Attending: Nurse Practitioner | Admitting: Nurse Practitioner

## 2019-09-17 ENCOUNTER — Other Ambulatory Visit: Payer: Self-pay

## 2019-09-17 DIAGNOSIS — G8929 Other chronic pain: Secondary | ICD-10-CM

## 2019-09-17 DIAGNOSIS — M5442 Lumbago with sciatica, left side: Secondary | ICD-10-CM | POA: Insufficient documentation

## 2019-09-17 DIAGNOSIS — M545 Low back pain: Secondary | ICD-10-CM | POA: Diagnosis not present

## 2019-09-17 NOTE — Progress Notes (Signed)
Please let Erin Contreras know imaging does show some worsening degenerative disc to lower back.  Suspect this is reason for recent pain.  I will see her on the 19th, if having ongoing pain at that time we may want to consider physical therapy.  Will discuss further at her upcoming visit.  Thank you!!

## 2019-09-21 ENCOUNTER — Ambulatory Visit: Payer: BC Managed Care – PPO | Admitting: Nurse Practitioner

## 2019-09-24 ENCOUNTER — Telehealth: Payer: Self-pay | Admitting: Nurse Practitioner

## 2019-09-24 ENCOUNTER — Other Ambulatory Visit: Payer: Self-pay | Admitting: Nurse Practitioner

## 2019-09-24 DIAGNOSIS — G8929 Other chronic pain: Secondary | ICD-10-CM

## 2019-09-24 NOTE — Telephone Encounter (Signed)
Ordered PT

## 2019-09-24 NOTE — Progress Notes (Signed)
re

## 2019-09-24 NOTE — Telephone Encounter (Signed)
Called and spoke with patient, she states that she will give PT a try, please order.

## 2019-09-24 NOTE — Telephone Encounter (Signed)
Copied from [place] [id number]. Topic: General - Call Back - No Documentation >> Sep 24, 2019 10:36 AM Erin Contreras wrote: Reason for CRM:   Pt states she is returning a call from Friday to schedule her physical therapy appts after having an xray done.

## 2019-09-25 DIAGNOSIS — M545 Low back pain: Secondary | ICD-10-CM | POA: Diagnosis not present

## 2019-09-30 ENCOUNTER — Emergency Department
Admission: EM | Admit: 2019-09-30 | Discharge: 2019-09-30 | Disposition: A | Discharge status: Home / Self Care | Payer: BC Managed Care – PPO | Attending: Emergency Medicine | Admitting: Emergency Medicine

## 2019-09-30 ENCOUNTER — Emergency Department: Payer: BC Managed Care – PPO

## 2019-09-30 ENCOUNTER — Other Ambulatory Visit: Payer: Self-pay

## 2019-09-30 DIAGNOSIS — Z79899 Other long term (current) drug therapy: Secondary | ICD-10-CM | POA: Insufficient documentation

## 2019-09-30 DIAGNOSIS — Z96651 Presence of right artificial knee joint: Secondary | ICD-10-CM | POA: Diagnosis not present

## 2019-09-30 DIAGNOSIS — M1712 Unilateral primary osteoarthritis, left knee: Secondary | ICD-10-CM | POA: Insufficient documentation

## 2019-09-30 DIAGNOSIS — M25462 Effusion, left knee: Secondary | ICD-10-CM | POA: Diagnosis not present

## 2019-09-30 DIAGNOSIS — M25562 Pain in left knee: Secondary | ICD-10-CM | POA: Diagnosis not present

## 2019-09-30 DIAGNOSIS — Z8616 Personal history of COVID-19: Secondary | ICD-10-CM | POA: Diagnosis not present

## 2019-09-30 MED ORDER — KETOROLAC TROMETHAMINE 30 MG/ML IJ SOLN
30.0000 mg | Freq: Once | INTRAMUSCULAR | Status: AC
  Administered 2019-09-30: 30 mg via INTRAMUSCULAR
  Filled 2019-09-30: qty 1

## 2019-09-30 MED ORDER — MELOXICAM 15 MG PO TABS: 15.0000 mg | ORAL_TABLET | Freq: Every day | ORAL | 1 refills | Status: DC

## 2019-09-30 NOTE — Discharge Instructions (Signed)
Follow-up with your primary care provider if any continued pain medication as needed.  Continue taking the meloxicam and the tramadol as needed.  The meloxicam is to be taken every day with food.  A knee immobilizer was applied to your left knee to support and protect your knee.  You do not have to wear this at bedtime.  You may need to ice and elevate your leg at home to see if this helps with pain.

## 2019-09-30 NOTE — ED Notes (Signed)
Pt verbalized understanding of discharge instructions. NAD at this time. 

## 2019-09-30 NOTE — ED Notes (Signed)
Pt with c/o of left leg pain that has been ongoing for approx 2 weeks. Pt denies injury. Pt states taking pain medication which initially provided some relief but no longer provides relief. Pt ambulatory to ED Flex room.

## 2019-09-30 NOTE — ED Provider Notes (Signed)
[facility] Emergency Department Provider Note   ____________________________________________   First MD Initiated Contact with Patient 09/30/19 0818     (approximate)  I have reviewed the triage vital signs and the nursing notes.   HISTORY  Chief Complaint Knee Pain   HPI Erin Contreras is a 59 y.o. female presents to the ED with complaint of left knee pain for approximately 3 weeks.  Patient denies any recent injury.  She states that she had a knee replacement on her right knee in 2018 and was told that in the near future she would need to have her left knee replaced.  Patient states that movement and weightbearing increases her pain.  She states she has been taking meloxicam for the last 3 days without any relief and also has a prescription for tramadol.  Currently she rates her pain as a 6/10.     Past Medical History:  Diagnosis Date  . Boils   . Ear mass   . Hemorrhoids   . Herpes simplex virus infection   . HSV infection   . Lumbago with sciatica   . OA (osteoarthritis) of knee   . Obesity   . Overactive bladder   . PONV (postoperative nausea and vomiting)   . RA (rheumatoid arthritis) ([place])   . Sleep apnea   . Urge and stress incontinence     Patient Active Problem List   Diagnosis Date Noted  . Lab test positive for detection of COVID-19 virus 09/13/2019  . Prediabetes 03/21/2019  . HPV (human papilloma virus) infection 02/03/2018  . Eczema 12/30/2017  . Vitamin D deficiency 12/30/2017  . Insomnia 12/30/2017  . Benign hypertension 07/11/2017  . Obstructive sleep apnea of adult 04/27/2017  . Multiple nodules of lung 04/27/2017  . Herpes simplex virus infection 03/17/2017  . OA (osteoarthritis) of knee   . RA (rheumatoid arthritis) ([place])   . Obesity   . Urge and stress incontinence   . Low back pain   . Gastroesophageal reflux disease with esophagitis 05/24/2014    Past Surgical History:  Procedure Laterality Date  .  CHOLECYSTECTOMY  2011  . FRACTURE SURGERY Right    as a child  . KNEE ARTHROPLASTY Right 07/11/2017   Procedure: COMPUTER ASSISTED TOTAL KNEE ARTHROPLASTY;  Surgeon: Dereck Leep, MD;  Location: [facility] ORS;  Service: Orthopedics;  Laterality: Right;  . SLEEVE GASTROPLASTY    . TUMOR REMOVAL Right 2010   ear    Prior to Admission medications   Medication Sig Start Date End Date Taking? Authorizing Provider  acetaminophen (TYLENOL 8 HOUR ARTHRITIS PAIN) 650 MG CR tablet Take 650 mg by mouth every 8 (eight) hours as needed for pain.    [provider]  acyclovir (ZOVIRAX) 400 MG tablet TAKE 1 TABLET (400 MG TOTAL) BY MOUTH 2 (TWO) TIMES DAILY AS NEEDED. 06/12/19   Cannady, Jolene T, NP  albuterol (VENTOLIN HFA) 108 (90 Base) MCG/ACT inhaler Inhale 2 puffs into the lungs every 6 (six) hours as needed for wheezing or shortness of breath. 09/13/19   Cannady, Henrine Screws T, NP  benzonatate (TESSALON) 100 MG capsule Take 1 capsule (100 mg total) by mouth 2 (two) times daily as needed for cough. 09/13/19   Cannady, Henrine Screws T, NP  meloxicam (MOBIC) 15 MG tablet Take 1 tablet (15 mg total) by mouth daily. 09/30/19   Johnn Hai, PA-C  traMADol (ULTRAM) 50 MG tablet Take 50 mg by mouth 2 (two) times daily as needed.  09/10/19   [provider]  traZODone (DESYREL) 100 MG tablet Take 1 tablet (100 mg total) by mouth at bedtime. 03/21/19   Cannady, Henrine Screws T, NP  VITAMIN D PO Take by mouth daily.    [provider]    Allergies Patient has no known allergies.  Family History  Problem Relation Age of Onset  . Cancer Mother        liver  . Diabetes Mother   . Hyperlipidemia Mother   . Hypertension Mother   . Cirrhosis Father        liver  . Hypertension Daughter   . Stroke Maternal Grandmother   . Stroke Maternal Grandfather   . Heart disease Paternal Grandmother   . Diabetes Paternal Grandmother   . Heart disease Paternal Grandfather   . Diabetes Paternal Grandfather      Social History Social History   Tobacco Use  . Smoking status: Never Smoker  . Smokeless tobacco: Never Used  Substance Use Topics  . Alcohol use: No  . Drug use: No    Review of Systems Constitutional: No fever/chills Cardiovascular: Denies chest pain. Respiratory: Denies shortness of breath. Gastrointestinal: No abdominal pain.  No nausea, no vomiting.   Musculoskeletal: Positive for left knee pain. Skin: Negative for rash. Neurological: Negative for headaches, focal weakness or numbness. ___________________________________________   PHYSICAL EXAM:  VITAL SIGNS: ED Triage Vitals  Enc Vitals Group     BP 09/30/19 0813 125/71     Pulse Rate 09/30/19 0813 94     Resp 09/30/19 0813 16     Temp 09/30/19 0813 97.8 F (36.6 C)     Temp Source 09/30/19 0813 Oral     SpO2 09/30/19 0813 97 %     Weight 09/30/19 0814 272 lb (123.4 kg)     Height 09/30/19 0814 5\' 9"  (1.753 m)     Head Circumference --      Peak Flow --      Pain Score 09/30/19 0814 6     Pain Loc --      Pain Edu? --      Excl. in [place]? --    Constitutional: Alert and oriented. Well appearing and in no acute distress. Eyes: Conjunctivae are normal.  Head: Atraumatic. Neck: No stridor.   Cardiovascular: Normal rate, regular rhythm. Grossly normal heart sounds.  Good peripheral circulation. Respiratory: Normal respiratory effort.  No retractions. Lungs CTAB. Gastrointestinal: Soft and nontender. No distention.  Musculoskeletal: On examination of the right there is a well-healed surgical scar anteriorly from her previous total knee 2018.  Left knee shows degenerative changes which is also noted with crepitus on range of motion and increased pain.  Patient is able to bear weight without assistance.  Skin is intact.  No erythema or warmth is noted.  Motor sensory function intact.  Gait is with a limp. Neurologic:  Normal speech and language. No gross focal neurologic deficits are appreciated.  Skin:  Skin is  warm, dry and intact. No rash noted. Psychiatric: Mood and affect are normal. Speech and behavior are normal.  ____________________________________________   LABS (all labs ordered are listed, but only abnormal results are displayed)  Labs Reviewed - No data to display  RADIOLOGY   Official radiology report(s): DG Knee Complete 4 Views Left  Result Date: 09/30/2019 CLINICAL DATA:  LEFT knee pain for 3 weeks. No known injury. Initial encounter. EXAM: LEFT KNEE - COMPLETE 4+ VIEW COMPARISON:  None. FINDINGS: No acute fracture or dislocation.  Mild LATERAL subluxation at the tibiotalar joint noted. Joint space narrowing osteophytosis noted, severe in the patellofemoral compartment. A small joint effusion is present. No suspicious focal bony lesions are identified. IMPRESSION: 1. Small joint effusion without acute bony abnormality. 2. Tricompartmental degenerative changes, severe in the patellofemoral compartment. Electronically Signed   By: Margarette Canada M.D.   On: 09/30/2019 09:23    ____________________________________________   PROCEDURES  Procedure(s) performed (including Critical Care):  Procedures  Knee immobilizer was applied.  ____________________________________________   INITIAL IMPRESSION / ASSESSMENT AND PLAN / ED COURSE  As part of my medical decision making, I reviewed the following data within the electronic MEDICAL RECORD NUMBER Notes from prior ED visits and [place] Controlled Substance Database  59 year old female presents to the ED with complaint of left knee pain for the last 2 weeks.  Patient states that there is been no injury.  She reports that she has been taking meloxicam for the last 3 days without any improvement.  She had knee replacement in 2018 and was told that her left knee would need to be replaced in the future.  X-rays do show some severe degenerative changes.  Range of motion is painful for the patient and we discussed putting her in a knee immobilizer and  crutches.  She is to continue taking the meloxicam and she has tramadol with her.  She is encouraged to make an appointment with orthopedics.  She was given Dr. Theodore Demark  name however she states that Dr. Marry Guan did her last knee replacement.  ____________________________________________   FINAL CLINICAL IMPRESSION(S) / ED DIAGNOSES  Final diagnoses:  Osteoarthritis of left knee, unspecified osteoarthritis type     ED Discharge Orders         Ordered    meloxicam (MOBIC) 15 MG tablet  Daily     09/30/19 0951           Note:  This document was prepared using Dragon voice recognition software and may include unintentional dictation errors.    Johnn Hai, PA-C 09/30/19 1243    Harvest Dark, MD 09/30/19 [phone]

## 2019-09-30 NOTE — ED Triage Notes (Signed)
Left knee pain for weeks, no injury. Pain worse with ambulating. Hx of right knee replacement. Pt alert and oriented X4, cooperative, RR even and unlabored, color WNL. Pt in NAD.

## 2019-10-01 DIAGNOSIS — M1712 Unilateral primary osteoarthritis, left knee: Secondary | ICD-10-CM | POA: Diagnosis not present

## 2019-10-01 DIAGNOSIS — M25562 Pain in left knee: Secondary | ICD-10-CM | POA: Diagnosis not present

## 2019-10-03 DIAGNOSIS — M545 Low back pain: Secondary | ICD-10-CM | POA: Diagnosis not present

## 2019-10-17 DIAGNOSIS — M545 Low back pain: Secondary | ICD-10-CM | POA: Diagnosis not present

## 2019-10-26 DIAGNOSIS — M25549 Pain in joints of unspecified hand: Secondary | ICD-10-CM | POA: Diagnosis not present

## 2019-10-26 DIAGNOSIS — M25511 Pain in right shoulder: Secondary | ICD-10-CM | POA: Diagnosis not present

## 2019-10-31 DIAGNOSIS — M545 Low back pain: Secondary | ICD-10-CM | POA: Diagnosis not present

## 2019-11-06 DIAGNOSIS — M545 Low back pain: Secondary | ICD-10-CM | POA: Diagnosis not present

## 2019-11-07 DIAGNOSIS — Z23 Encounter for immunization: Secondary | ICD-10-CM | POA: Diagnosis not present

## 2019-11-09 ENCOUNTER — Ambulatory Visit: Payer: BC Managed Care – PPO | Admitting: Nurse Practitioner

## 2019-11-14 DIAGNOSIS — M059 Rheumatoid arthritis with rheumatoid factor, unspecified: Secondary | ICD-10-CM | POA: Diagnosis not present

## 2019-11-21 DIAGNOSIS — R768 Other specified abnormal immunological findings in serum: Secondary | ICD-10-CM | POA: Diagnosis not present

## 2019-11-21 DIAGNOSIS — Z1159 Encounter for screening for other viral diseases: Secondary | ICD-10-CM | POA: Diagnosis not present

## 2019-11-21 DIAGNOSIS — M25562 Pain in left knee: Secondary | ICD-10-CM | POA: Diagnosis not present

## 2019-11-21 DIAGNOSIS — Z79899 Other long term (current) drug therapy: Secondary | ICD-10-CM | POA: Diagnosis not present

## 2019-11-21 DIAGNOSIS — M0579 Rheumatoid arthritis with rheumatoid factor of multiple sites without organ or systems involvement: Secondary | ICD-10-CM | POA: Diagnosis not present

## 2019-11-21 DIAGNOSIS — G8929 Other chronic pain: Secondary | ICD-10-CM | POA: Diagnosis not present

## 2019-11-21 DIAGNOSIS — M79642 Pain in left hand: Secondary | ICD-10-CM | POA: Diagnosis not present

## 2019-11-21 DIAGNOSIS — M19072 Primary osteoarthritis, left ankle and foot: Secondary | ICD-10-CM | POA: Diagnosis not present

## 2019-11-21 DIAGNOSIS — M7989 Other specified soft tissue disorders: Secondary | ICD-10-CM | POA: Diagnosis not present

## 2019-11-21 DIAGNOSIS — M19041 Primary osteoarthritis, right hand: Secondary | ICD-10-CM | POA: Diagnosis not present

## 2019-11-21 DIAGNOSIS — M19071 Primary osteoarthritis, right ankle and foot: Secondary | ICD-10-CM | POA: Diagnosis not present

## 2019-11-21 DIAGNOSIS — M1712 Unilateral primary osteoarthritis, left knee: Secondary | ICD-10-CM | POA: Diagnosis not present

## 2019-12-21 DIAGNOSIS — M0579 Rheumatoid arthritis with rheumatoid factor of multiple sites without organ or systems involvement: Secondary | ICD-10-CM | POA: Diagnosis not present

## 2019-12-21 DIAGNOSIS — Z79899 Other long term (current) drug therapy: Secondary | ICD-10-CM | POA: Diagnosis not present

## 2019-12-27 ENCOUNTER — Encounter: Payer: Self-pay | Admitting: Family Medicine

## 2019-12-27 ENCOUNTER — Other Ambulatory Visit: Payer: Self-pay

## 2019-12-27 ENCOUNTER — Ambulatory Visit (INDEPENDENT_AMBULATORY_CARE_PROVIDER_SITE_OTHER): Payer: BC Managed Care – PPO | Admitting: Family Medicine

## 2019-12-27 VITALS — BP 96/68 | HR 79 | Temp 98.0°F | Wt 265.0 lb

## 2019-12-27 DIAGNOSIS — N898 Other specified noninflammatory disorders of vagina: Secondary | ICD-10-CM | POA: Diagnosis not present

## 2019-12-27 LAB — WET PREP FOR TRICH, YEAST, CLUE
Clue Cell Exam: POSITIVE — AB
Trichomonas Exam: NEGATIVE
Yeast Exam: NEGATIVE

## 2019-12-27 MED ORDER — METRONIDAZOLE 500 MG PO TABS: 500.0000 mg | ORAL_TABLET | Freq: Two times a day (BID) | ORAL | 0 refills | Status: DC

## 2019-12-27 MED ORDER — NYSTATIN 100000 UNIT/GM EX CREA: 1.0000 "application " | TOPICAL_CREAM | Freq: Two times a day (BID) | CUTANEOUS | 0 refills | Status: DC

## 2019-12-27 NOTE — Progress Notes (Signed)
BP 96/68  Pulse 79  Temp 98 F (36.7 C) (Oral)  Wt 265 lb (120.2 kg)  SpO2 98%  BMI 39.13 kg/m  Subjective:  Subjective  Patient ID: Erin Contreras, female DOB: 1960-11-01, 59 y.o. MRN: [id number]  HPI:  Erin Contreras is a 59 y.o. female      Chief Complaint  Patient presents with  . Vaginal Discharge    x about 2 weeks  . Urinary Frequency   Increased vaginal discharge and itching the past 2 weeks, sometimes with odor and urinary frequency, lower abdominal pain. Not trying anything OTC for sxs. Denies concern for STIs, fever, chills, rashes, dysuria, hematuria, N/V/D. Hx of BV infections in the past.  Relevant past medical, surgical, family and social history reviewed and updated as indicated. Interim medical history since our last visit reviewed.  Allergies and medications reviewed and updated.   Review of Systems  Per HPI unless specifically indicated above  Objective:  Objective  BP 96/68  Pulse 79  Temp 98 F (36.7 C) (Oral)  Wt 265 lb (120.2 kg)  SpO2 98%  BMI 39.13 kg/m     Wt Readings from Last 3 Encounters:  12/27/19 265 lb (120.2 kg)  09/30/19 272 lb (123.4 kg)  07/13/19 268 lb (121.6 kg)   Physical Exam  Vitals and nursing note reviewed.  Constitutional:  Appearance: Normal appearance. She is not ill-appearing.  HENT:  Head: Atraumatic.  Eyes:  Extraocular Movements: Extraocular movements intact.  Conjunctiva/sclera: Conjunctivae normal.  Cardiovascular:  Rate and Rhythm: Normal rate and regular rhythm.  Heart sounds: Normal heart sounds.  Pulmonary:  Effort: Pulmonary effort is normal.  Breath sounds: Normal breath sounds.  Abdominal:  General: Bowel sounds are normal. There is no distension.  Palpations: Abdomen is soft. There is no mass.  Tenderness: There is abdominal tenderness (minimal RLQ ttp). There is no right CVA tenderness, left CVA tenderness or guarding.  Genitourinary:  Vagina: Vaginal discharge present.  Musculoskeletal:    General: Normal range of motion.  Cervical back: Normal range of motion and neck supple.  Skin:  General: Skin is warm and dry.  Neurological:  Mental Status: She is alert and oriented to person, place, and time.  Psychiatric:  Mood and Affect: Mood normal.  Thought Content: Thought content normal.  Judgment: Judgment normal.       Results for orders placed or performed in visit on 03/21/19  WET PREP FOR [place], YEAST, CLUE   Specimen: Vaginal Fluid   VAGINAL FLUI  Result Value Ref Range   Trichomonas Exam Negative Negative   Yeast Exam Negative Negative   Clue Cell Exam Negative Negative  CBC with Differential/Platelet  Result Value Ref Range   WBC 6.1 3.4 - 10.8 x10E3/uL   RBC 4.57 3.77 - 5.28 x10E6/uL   Hemoglobin 13.0 11.1 - 15.9 g/dL   Hematocrit 39.8 34.0 - 46.6 %   MCV 87 79 - 97 fL   MCH 28.4 26.6 - 33.0 pg   MCHC 32.7 31.5 - 35.7 g/dL   RDW 13.1 11.7 - 15.4 %   Platelets 259 150 - 450 x10E3/uL   Neutrophils 61 Not Estab. %   Lymphs 24 Not Estab. %   Monocytes 7 Not Estab. %   Eos 7 Not Estab. %   Basos 1 Not Estab. %   Neutrophils Absolute 3.8 1.4 - 7.0 x10E3/uL   Lymphocytes Absolute 1.5 0.7 - 3.1 x10E3/uL   Monocytes Absolute 0.4 0.1 - 0.9 x10E3/uL  EOS (ABSOLUTE) 0.4 0.0 - 0.4 x10E3/uL   Basophils Absolute 0.1 0.0 - 0.2 x10E3/uL   Immature Granulocytes 0 Not Estab. %   Immature Grans (Abs) 0.0 0.0 - 0.1 x10E3/uL  Comprehensive metabolic panel  Result Value Ref Range   Glucose 82 65 - 99 mg/dL   BUN 14 6 - 24 mg/dL   Creatinine, Ser 0.74 0.57 - 1.00 mg/dL   GFR calc non Af Amer 89 >59 mL/min/1.73   GFR calc Af Amer 103 >59 mL/min/1.73   BUN/Creatinine Ratio 19 9 - 23   Sodium 143 134 - 144 mmol/L   Potassium 4.3 3.5 - 5.2 mmol/L   Chloride 105 96 - 106 mmol/L   CO2 26 20 - 29 mmol/L   Calcium 9.4 8.7 - 10.2 mg/dL   Total Protein 7.1 6.0 - 8.5 g/dL   Albumin 4.2 3.8 - 4.9 g/dL   Globulin, Total 2.9 1.5 - 4.5 g/dL   Albumin/Globulin Ratio 1.4 1.2 -  2.2   Bilirubin Total 0.4 0.0 - 1.2 mg/dL   Alkaline Phosphatase 92 39 - 117 IU/L   AST 27 0 - 40 IU/L   ALT 21 0 - 32 IU/L  Lipid Panel w/o Chol/HDL Ratio  Result Value Ref Range   Cholesterol, Total 206 (H) 100 - 199 mg/dL   Triglycerides 113 0 - 149 mg/dL   HDL 71 >39 mg/dL   VLDL Cholesterol Cal 23 5 - 40 mg/dL   LDL Calculated 112 (H) 0 - 99 mg/dL  TSH  Result Value Ref Range   TSH 0.583 0.450 - 4.500 uIU/mL  VITAMIN D 25 Hydroxy (Vit-D Deficiency, Fractures)  Result Value Ref Range   Vit D, 25-Hydroxy 16.6 (L) 30.0 - 100.0 ng/mL  HgB A1c  Result Value Ref Range   Hgb A1c MFr Bld 5.9 (H) 4.8 - 5.6 %   Est. average glucose Bld gHb Est-mCnc 123 mg/dL  UA/M w/rflx Culture, Routine   Specimen: Vaginal Fluid   VAGINAL FLUI  Result Value Ref Range   Specific Gravity, UA >1.030 (H) 1.005 - 1.030   pH, UA 5.5 5.0 - 7.5   Color, UA Yellow Yellow   Appearance Ur Clear Clear   Leukocytes,UA Negative Negative   Protein,UA Negative Negative/Trace   Glucose, UA Negative Negative   Ketones, UA Negative Negative   RBC, UA Negative Negative   Bilirubin, UA Negative Negative   Urobilinogen, Ur 0.2 0.2 - 1.0 mg/dL   Nitrite, UA Negative Negative   Assessment & Plan:      Problem List Items Addressed This Visit     None            Visit Diagnoses     Vaginal discharge - Primary   Relevant Orders   UA/M w/rflx Culture, Routine   WET PREP FOR TRICH, YEAST, CLUE   Wet prep positive for BV, tx with flagyl, good vaginal hygiene, probiotics. F/u if not improving     Follow up plan:  No follow-ups on file.

## 2019-12-29 LAB — MICROSCOPIC EXAMINATION
Bacteria, UA: NONE SEEN
RBC, Urine: NONE SEEN /hpf (ref 0–2)

## 2019-12-29 LAB — UA/M W/RFLX CULTURE, ROUTINE
Bilirubin, UA: NEGATIVE
Glucose, UA: NEGATIVE
Ketones, UA: NEGATIVE
Nitrite, UA: NEGATIVE
Protein,UA: NEGATIVE
RBC, UA: NEGATIVE
Specific Gravity, UA: 1.025 (ref 1.005–1.030)
Urobilinogen, Ur: 0.2 mg/dL (ref 0.2–1.0)
pH, UA: 5.5 (ref 5.0–7.5)

## 2019-12-29 LAB — URINE CULTURE, REFLEX

## 2020-01-04 DIAGNOSIS — M170 Bilateral primary osteoarthritis of knee: Secondary | ICD-10-CM | POA: Diagnosis not present

## 2020-01-04 DIAGNOSIS — M0579 Rheumatoid arthritis with rheumatoid factor of multiple sites without organ or systems involvement: Secondary | ICD-10-CM | POA: Diagnosis not present

## 2020-01-04 DIAGNOSIS — Z79899 Other long term (current) drug therapy: Secondary | ICD-10-CM | POA: Diagnosis not present

## 2020-01-04 DIAGNOSIS — Z111 Encounter for screening for respiratory tuberculosis: Secondary | ICD-10-CM | POA: Diagnosis not present

## 2020-01-08 DIAGNOSIS — Z96651 Presence of right artificial knee joint: Secondary | ICD-10-CM | POA: Diagnosis not present

## 2020-01-08 DIAGNOSIS — M1712 Unilateral primary osteoarthritis, left knee: Secondary | ICD-10-CM | POA: Diagnosis not present

## 2020-02-07 ENCOUNTER — Other Ambulatory Visit: Payer: Self-pay

## 2020-02-07 ENCOUNTER — Ambulatory Visit (INDEPENDENT_AMBULATORY_CARE_PROVIDER_SITE_OTHER): Payer: BC Managed Care – PPO | Admitting: Nurse Practitioner

## 2020-02-07 ENCOUNTER — Encounter: Payer: Self-pay | Admitting: Nurse Practitioner

## 2020-02-07 ENCOUNTER — Other Ambulatory Visit: Payer: BC Managed Care – PPO

## 2020-02-07 VITALS — Ht 68.0 in | Wt 262.0 lb

## 2020-02-07 DIAGNOSIS — R82998 Other abnormal findings in urine: Secondary | ICD-10-CM | POA: Insufficient documentation

## 2020-02-07 DIAGNOSIS — N898 Other specified noninflammatory disorders of vagina: Secondary | ICD-10-CM

## 2020-02-07 LAB — WET PREP FOR TRICH, YEAST, CLUE
Clue Cell Exam: NEGATIVE
Trichomonas Exam: NEGATIVE
Yeast Exam: NEGATIVE

## 2020-02-07 NOTE — Progress Notes (Signed)
Ht 5\' 8"  (1.727 m)   Wt 262 lb (118.8 kg)   BMI 39.84 kg/m    Subjective:    Patient ID: Erin Contreras, female    DOB: 30-Mar-1961, 59 y.o.   MRN: [id number]  HPI: Erin Contreras is a 59 y.o. female presenting with vaginal itching.  Chief Complaint  Patient presents with  . Vaginal Itching    Patient states she has vaginal itching and redness on the outside of her vagina.    VAGINAL ITCHING Reports urine has been really dark. Duration: weeks Discharge description: none  Pruritus: yes Dysuria: no Malodorous: no Urinary frequency: yes Fevers: no Abdominal pain: no  Back pain: yes - right side Sexual activity: not sexually active History of sexually transmitted diseases: no Recent antibiotic use: no Context: better  Treatments attempted: soap changed - used Dove and now uses summer's eve.   No Known Allergies  Outpatient Encounter Medications as of 02/07/2020  Medication Sig Note  . acetaminophen (TYLENOL 8 HOUR ARTHRITIS PAIN) 650 MG CR tablet Take 650 mg by mouth every 8 (eight) hours as needed for pain.   Marland Kitchen acyclovir (ZOVIRAX) 400 MG tablet TAKE 1 TABLET (400 MG TOTAL) BY MOUTH 2 (TWO) TIMES DAILY AS NEEDED.   Marland Kitchen albuterol (VENTOLIN HFA) 108 (90 Base) MCG/ACT inhaler Inhale 2 puffs into the lungs every 6 (six) hours as needed for wheezing or shortness of breath.   . gabapentin (NEURONTIN) 100 MG capsule Take by mouth 3 (three) times daily.    Marland Kitchen ibuprofen (ADVIL) 800 MG tablet Take 800 mg by mouth 3 (three) times daily. 12/27/2019: As needed  . methotrexate (RHEUMATREX) 2.5 MG tablet 8 tabs= 20 mg weekly, 32 tabs   . nystatin cream (MYCOSTATIN) Apply 1 application topically 2 (two) times daily.   . traMADol (ULTRAM) 50 MG tablet Take 50 mg by mouth 2 (two) times daily as needed.   . traZODone (DESYREL) 100 MG tablet Take 1 tablet (100 mg total) by mouth at bedtime. 07/05/2019: As needed  . meloxicam (MOBIC) 15 MG tablet Take 1 tablet (15 mg total) by mouth daily. (Patient  not taking: Reported on 02/07/2020)   . [DISCONTINUED] metroNIDAZOLE (FLAGYL) 500 MG tablet Take 1 tablet (500 mg total) by mouth 2 (two) times daily. (Patient not taking: Reported on 02/07/2020)    No facility-administered encounter medications on file as of 02/07/2020.   Patient Active Problem List   Diagnosis Date Noted  . Leukocytes in urine 02/07/2020  . Lab test positive for detection of COVID-19 virus 09/13/2019  . Prediabetes 03/21/2019  . Vaginal itching 03/21/2019  . HPV (human papilloma virus) infection 02/03/2018  . Eczema 12/30/2017  . Vitamin D deficiency 12/30/2017  . Insomnia 12/30/2017  . Benign hypertension 07/11/2017  . Obstructive sleep apnea of adult 04/27/2017  . Multiple nodules of lung 04/27/2017  . Herpes simplex virus infection 03/17/2017  . OA (osteoarthritis) of knee   . RA (rheumatoid arthritis) ([place])   . Obesity   . Urge and stress incontinence   . Low back pain   . Gastroesophageal reflux disease with esophagitis 05/24/2014   Past Medical History:  Diagnosis Date  . Boils   . Ear mass   . Hemorrhoids   . Herpes simplex virus infection   . HSV infection   . Lumbago with sciatica   . OA (osteoarthritis) of knee   . Obesity   . Overactive bladder   . PONV (postoperative nausea and vomiting)   .  RA (rheumatoid arthritis) ([employer])   . Sleep apnea   . Urge and stress incontinence    Relevant past medical, surgical, family and social history reviewed and updated as indicated. Interim medical history since our last visit reviewed.  Review of Systems  Constitutional: Negative.  Negative for activity change, appetite change, chills, diaphoresis, fatigue and fever.  Genitourinary: Positive for flank pain, genital sores and vaginal pain. Negative for decreased urine volume, dysuria, enuresis, frequency, hematuria, pelvic pain, urgency, vaginal bleeding and vaginal discharge.  Skin: Negative.   Neurological: Negative.   Psychiatric/Behavioral: Negative.      Per HPI unless specifically indicated above     Objective:    Ht 5\' 8"  (1.727 m)   Wt 262 lb (118.8 kg)   BMI 39.84 kg/m   Wt Readings from Last 3 Encounters:  02/07/20 262 lb (118.8 kg)  12/27/19 265 lb (120.2 kg)  09/30/19 272 lb (123.4 kg)    Physical Exam Physical examination unable to be performed due to lack of equipment.    Assessment & Plan:   Problem List Items Addressed This Visit      Musculoskeletal and Integument   Vaginal itching - Primary    Acute, ongoing.  Wet prep negative.  Likely either vaginitis due to possible vaginal atrophy, contact dermitis from soaps, or potential urinary tract infection.  Will await urine culture results prior to treating with antibiotics.  In meantime, use good vaginal hygiene including no soaps, lotions, or powders.  Return to clinic if symptoms worsen or persist.        Other   Leukocytes in urine    Acute, ongoing.  Will send urine for culture and follow.  Likely either vaginitis due to vaginal contact dermatitis, vaginal atrophy or possible urinary tract infection.  Will await culture results prior to treating with antibiotics.  Patient to call or return to clinic if symptoms worsen in the meantime.          Follow up plan: Return if symptoms worsen or fail to improve.   This visit was completed via telephone due to the restrictions of the COVID-19 pandemic. All issues as above were discussed and addressed but no physical exam was performed. If it was felt that the patient should be evaluated in the office, they were directed there. The patient verbally consented to this visit. Patient was unable to complete an audio/visual visit due to Lack of equipment. . Location of the patient: home . Location of the provider: work . Those involved with this call:  . Provider: Carnella Guadalajara, DNP . CMA: Merilyn Baba, CMA . Front Desk/Registration: [facility]  . Time spent on call: 11 minutes on the phone discussing health concerns. 15  minutes total spent in review of patient's record and preparation of their chart.  I verified patient identity using two factors (patient name and date of birth). Patient consents verbally to being seen via telemedicine visit today.

## 2020-02-07 NOTE — Assessment & Plan Note (Addendum)
Acute, ongoing.  Will send urine for culture and follow.  Likely either vaginitis due to vaginal contact dermatitis, vaginal atrophy or possible urinary tract infection.  Will await culture results prior to treating with antibiotics.  Patient to call or return to clinic if symptoms worsen in the meantime.

## 2020-02-07 NOTE — Assessment & Plan Note (Signed)
Acute, ongoing.  Wet prep negative.  Likely either vaginitis due to possible vaginal atrophy, contact dermitis from soaps, or potential urinary tract infection.  Will await urine culture results prior to treating with antibiotics.  In meantime, use good vaginal hygiene including no soaps, lotions, or powders.  Return to clinic if symptoms worsen or persist.

## 2020-02-07 NOTE — Patient Instructions (Signed)

## 2020-02-09 LAB — MICROSCOPIC EXAMINATION: Bacteria, UA: NONE SEEN

## 2020-02-09 LAB — UA/M W/RFLX CULTURE, ROUTINE
Bilirubin, UA: NEGATIVE
Glucose, UA: NEGATIVE
Ketones, UA: NEGATIVE
Nitrite, UA: NEGATIVE
Protein,UA: NEGATIVE
Specific Gravity, UA: 1.025 (ref 1.005–1.030)
Urobilinogen, Ur: 0.2 mg/dL (ref 0.2–1.0)
pH, UA: 5.5 (ref 5.0–7.5)

## 2020-02-09 LAB — URINE CULTURE, REFLEX

## 2020-03-11 ENCOUNTER — Ambulatory Visit (INDEPENDENT_AMBULATORY_CARE_PROVIDER_SITE_OTHER): Payer: BC Managed Care – PPO | Admitting: Nurse Practitioner

## 2020-03-11 ENCOUNTER — Other Ambulatory Visit: Payer: Self-pay

## 2020-03-11 ENCOUNTER — Encounter: Payer: Self-pay | Admitting: Nurse Practitioner

## 2020-03-11 VITALS — BP 121/83 | HR 68 | Temp 98.1°F | Wt 263.4 lb

## 2020-03-11 DIAGNOSIS — E559 Vitamin D deficiency, unspecified: Secondary | ICD-10-CM

## 2020-03-11 DIAGNOSIS — M0579 Rheumatoid arthritis with rheumatoid factor of multiple sites without organ or systems involvement: Secondary | ICD-10-CM

## 2020-03-11 DIAGNOSIS — G8929 Other chronic pain: Secondary | ICD-10-CM

## 2020-03-11 DIAGNOSIS — M5441 Lumbago with sciatica, right side: Secondary | ICD-10-CM | POA: Diagnosis not present

## 2020-03-11 DIAGNOSIS — Z6841 Body Mass Index (BMI) 40.0 and over, adult: Secondary | ICD-10-CM

## 2020-03-11 MED ORDER — GABAPENTIN 300 MG PO CAPS: 300.0000 mg | ORAL_CAPSULE | Freq: Three times a day (TID) | ORAL | 3 refills | Status: DC

## 2020-03-11 MED ORDER — TRAMADOL HCL 50 MG PO TABS: 50.0000 mg | ORAL_TABLET | Freq: Two times a day (BID) | ORAL | 0 refills | Status: DC | PRN

## 2020-03-11 NOTE — Assessment & Plan Note (Addendum)
Chronic and ongoing for several months now with no benefit from PT.  No red flags on exam.  X-ray imaging done in February noting worsening DDD.  Referral to pain management placed due to ongoing pain, discussed with her approach to pain management starting with lowest risk medications or injections vs chronic opioid use, she verbalized understanding.  Order for MRI placed to further assess lower back.  Increase Gabapentin to 300 MG TID and give brief burst of Tramadol for PRN use only.  BMP today. Recommend modest weight loss.  Return to office in 6 weeks.

## 2020-03-11 NOTE — Assessment & Plan Note (Signed)
Continue supplement and check Vit D level today.

## 2020-03-11 NOTE — Assessment & Plan Note (Signed)
BMI 40.05 today.  Recommended eating smaller high protein, low fat meals more frequently and exercising 30 mins a day 5 times a week with a goal of 10-15lb weight loss in the next 3 months. Patient voiced their understanding and motivation to adhere to these recommendations.

## 2020-03-11 NOTE — Progress Notes (Signed)
Peer to peer done for MRI and auth # [id number], valid today 03/11/20 to 09/06/2020.

## 2020-03-11 NOTE — Progress Notes (Addendum)
BP 121/83 (BP Location: Left Arm, Cuff Size: Normal)   Pulse 68   Temp 98.1 F (36.7 C) (Oral)   Wt 263 lb 6.4 oz (119.5 kg)   SpO2 95%   BMI 40.05 kg/m    Subjective:    Patient ID: Erin Contreras, female    DOB: [birth year], 59 y.o.   MRN: [id number]  HPI: Erin Contreras is a 59 y.o. female  Chief Complaint  Patient presents with  . Back Pain    R lower back pain that runs down into her buttock and into her leg, states it has been hurting for a while but just keeps getting worse   BACK PAIN Here today for ongoing back pain, initially seen for this in February during which time she had x-ray imaging noting "Multilevel disc space narrowing appears slightly progressed as compared to prior examination 10/13/2012, greatest at L4-L5 and L5-S1" and lumbar spondylosis.  She did attend physical therapy for 5 weeks with no benefit, last in April.  Is also currently followed by Dr. Annalee Genta for RA and taking Enbrel, last seen 01/04/20.  Has Gabapentin for her knee, which she feels helps her back a little.   Duration: weeks Mechanism of injury: no trauma Location: Right and low back Onset: gradual Severity: 9/10 Quality: dull, aching and shooting Frequency: constant Radiation: R leg above the knee Aggravating factors: lifting, movement, walking and bending Alleviating factors: ice, heat, NSAIDs, APAP and narcotics Status: worse Treatments attempted: ice, heat, APAP, ibuprofen and physical therapy  Relief with NSAIDs?: moderate Nighttime pain:  no Paresthesias / decreased sensation:  no Bowel / bladder incontinence:  no Fevers:  no Dysuria / urinary frequency:  no  Relevant past medical, surgical, family and social history reviewed and updated as indicated. Interim medical history since our last visit reviewed. Allergies and medications reviewed and updated.  Review of Systems  Constitutional: Negative for activity change, appetite change, diaphoresis, fatigue and fever.    Respiratory: Negative for cough, chest tightness and shortness of breath.   Cardiovascular: Negative for chest pain, palpitations and leg swelling.  Gastrointestinal: Negative.   Musculoskeletal: Positive for back pain.  Neurological: Negative.   Psychiatric/Behavioral: Negative.     Per HPI unless specifically indicated above     Objective:    BP 121/83 (BP Location: Left Arm, Cuff Size: Normal)   Pulse 68   Temp 98.1 F (36.7 C) (Oral)   Wt 263 lb 6.4 oz (119.5 kg)   SpO2 95%   BMI 40.05 kg/m   Wt Readings from Last 3 Encounters:  03/11/20 263 lb 6.4 oz (119.5 kg)  02/07/20 262 lb (118.8 kg)  12/27/19 265 lb (120.2 kg)    Physical Exam Vitals and nursing note reviewed.  Constitutional:      General: She is awake. She is not in acute distress.    Appearance: She is well-developed and well-groomed. She is obese. She is not ill-appearing.  HENT:     Head: Normocephalic.     Right Ear: Hearing normal.     Left Ear: Hearing normal.  Eyes:     General: Lids are normal.        Right eye: No discharge.        Left eye: No discharge.     Conjunctiva/sclera: Conjunctivae normal.     Pupils: Pupils are equal, round, and reactive to light.  Neck:     Thyroid: No thyromegaly.     Vascular: No carotid bruit or JVD.  Cardiovascular:     Rate and Rhythm: Normal rate and regular rhythm.     Heart sounds: Normal heart sounds. No murmur heard.  No gallop.   Pulmonary:     Effort: Pulmonary effort is normal. No accessory muscle usage or respiratory distress.     Breath sounds: Normal breath sounds.  Abdominal:     General: Bowel sounds are normal.     Palpations: Abdomen is soft.  Musculoskeletal:     Cervical back: Normal range of motion and neck supple.     Lumbar back: No swelling, edema, signs of trauma, lacerations or tenderness. Decreased range of motion. Positive right straight leg raise test. Negative left straight leg raise test.     Right lower leg: No edema.      Left lower leg: No edema.     Comments: Decreased flexion and extension lower back with discomfort noted.    Lymphadenopathy:     Cervical: No cervical adenopathy.  Skin:    General: Skin is warm and dry.     Findings: No rash.  Neurological:     Mental Status: She is alert and oriented to person, place, and time.     Deep Tendon Reflexes: Reflexes are normal and symmetric.     Reflex Scores:      Brachioradialis reflexes are 2+ on the right side and 2+ on the left side.      Patellar reflexes are 2+ on the right side and 2+ on the left side. Psychiatric:        Attention and Perception: Attention normal.        Mood and Affect: Mood normal.        Speech: Speech normal.        Behavior: Behavior normal. Behavior is cooperative.        Thought Content: Thought content normal.     Results for orders placed or performed in visit on 02/07/20  WET PREP FOR [place], YEAST, CLUE   Specimen: Urine   Urine  Result Value Ref Range   Trichomonas Exam Negative Negative   Yeast Exam Negative Negative   Clue Cell Exam Negative Negative  Microscopic Examination   Urine  Result Value Ref Range   WBC, UA 0-5 0 - 5 /hpf   RBC 0-2 0 - 2 /hpf   Epithelial Cells (non renal) 0-10 0 - 10 /hpf   Bacteria, UA None seen None seen/Few  Urine Culture, Reflex   Urine  Result Value Ref Range   Urine Culture, Routine Final report    Organism ID, Bacteria Comment   UA/M w/rflx Culture, Routine   Specimen: Urine   Urine  Result Value Ref Range   Specific Gravity, UA 1.025 1.005 - 1.030   pH, UA 5.5 5.0 - 7.5   Color, UA Yellow Yellow   Appearance Ur Clear Clear   Leukocytes,UA 1+ (A) Negative   Protein,UA Negative Negative/Trace   Glucose, UA Negative Negative   Ketones, UA Negative Negative   RBC, UA Trace (A) Negative   Bilirubin, UA Negative Negative   Urobilinogen, Ur 0.2 0.2 - 1.0 mg/dL   Nitrite, UA Negative Negative   Microscopic Examination See below:    Urinalysis Reflex Comment        Assessment & Plan:   Problem List Items Addressed This Visit      Musculoskeletal and Integument   RA (rheumatoid arthritis) ([place]) - Primary    Ongoing, continue collaboration with rheumatology and current medication regimen as  prescribed by them.      Relevant Medications   etanercept (ENBREL) 50 MG/ML injection   traMADol (ULTRAM) 50 MG tablet     Other   Obesity    BMI 40.05 today.  Recommended eating smaller high protein, low fat meals more frequently and exercising 30 mins a day 5 times a week with a goal of 10-15lb weight loss in the next 3 months. Patient voiced their understanding and motivation to adhere to these recommendations.       Low back pain    Chronic and ongoing for several months now with no benefit from PT.  No red flags on exam.  X-ray imaging done in February noting worsening DDD.  Referral to pain management placed due to ongoing pain, discussed with her approach to pain management starting with lowest risk medications or injections vs chronic opioid use, she verbalized understanding.  Order for MRI placed to further assess lower back.  Increase Gabapentin to 300 MG TID and give brief burst of Tramadol for PRN use only.  BMP today. Recommend modest weight loss.  Return to office in 6 weeks.      Relevant Medications   etanercept (ENBREL) 50 MG/ML injection   traMADol (ULTRAM) 50 MG tablet   Other Relevant Orders   Basic metabolic panel   Ambulatory referral to Pain Clinic   MR Lumbar Spine Wo Contrast   Vitamin D deficiency    Continue supplement and check Vit D level today.      Relevant Orders   VITAMIN D 25 Hydroxy (Vit-D Deficiency, Fractures)       Follow up plan: Return in about 6 weeks (around 04/22/2020) for Back pain follow-up.

## 2020-03-11 NOTE — Patient Instructions (Signed)

## 2020-03-11 NOTE — Assessment & Plan Note (Signed)
Ongoing, continue collaboration with rheumatology and current medication regimen as prescribed by them.

## 2020-03-12 ENCOUNTER — Other Ambulatory Visit: Payer: Self-pay | Admitting: Nurse Practitioner

## 2020-03-12 DIAGNOSIS — E559 Vitamin D deficiency, unspecified: Secondary | ICD-10-CM

## 2020-03-12 LAB — BASIC METABOLIC PANEL
BUN/Creatinine Ratio: 22 (ref 12–28)
BUN: 15 mg/dL (ref 8–27)
CO2: 24 mmol/L (ref 20–29)
Calcium: 9.2 mg/dL (ref 8.7–10.3)
Chloride: 107 mmol/L — ABNORMAL HIGH (ref 96–106)
Creatinine, Ser: 0.67 mg/dL (ref 0.57–1.00)
GFR calc Af Amer: 110 mL/min/{1.73_m2} (ref >59)
GFR calc non Af Amer: 96 mL/min/{1.73_m2} (ref >59)
Glucose: 91 mg/dL (ref 65–99)
Potassium: 4.3 mmol/L (ref 3.5–5.2)
Sodium: 142 mmol/L (ref 134–144)

## 2020-03-12 LAB — VITAMIN D 25 HYDROXY (VIT D DEFICIENCY, FRACTURES): Vit D, 25-Hydroxy: 15.1 ng/mL — ABNORMAL LOW (ref 30.0–100.0)

## 2020-03-12 MED ORDER — CHOLECALCIFEROL 1.25 MG (50000 UT) PO TABS: 1.0000 | ORAL_TABLET | ORAL | 0 refills | Status: DC

## 2020-03-12 NOTE — Progress Notes (Signed)
Please let Erin Contreras know her labs have returned.  Kidney function and electrolytes remain stable.  Vitamin D level continues to be low.  I have sent her in a higher dose of Vitamin D I want her to take weekly for 8 weeks and then come in for outpatient labs to recheck this, if improved at that time we may change to daily lower dose Vitamin D3.  Vitamin D is important for bone health.  If any questions let me know.

## 2020-03-29 ENCOUNTER — Ambulatory Visit
Admission: RE | Admit: 2020-03-29 | Discharge: 2020-03-29 | Disposition: A | Discharge status: Home / Self Care | Payer: BC Managed Care – PPO | Source: Ambulatory Visit | Attending: Nurse Practitioner | Admitting: Nurse Practitioner

## 2020-03-29 ENCOUNTER — Other Ambulatory Visit: Payer: Self-pay

## 2020-03-29 DIAGNOSIS — G8929 Other chronic pain: Secondary | ICD-10-CM | POA: Diagnosis not present

## 2020-03-29 DIAGNOSIS — M5441 Lumbago with sciatica, right side: Secondary | ICD-10-CM | POA: Diagnosis not present

## 2020-03-29 DIAGNOSIS — M545 Low back pain: Secondary | ICD-10-CM | POA: Diagnosis not present

## 2020-04-01 ENCOUNTER — Encounter: Payer: Self-pay | Admitting: Nurse Practitioner

## 2020-04-01 NOTE — Progress Notes (Signed)
I have attempted to call patient twice, with no answer.  Left HIPAA compliant messages both times. I am going to write letter and if she calls please alert her: Your MRI has returned, it is showing multiple disc bulges L5-S1, T12-L1, and L2-L3.  There is also disc degeneration and narrowing.  We will see what pain management can do to help, but you may also benefit from seeing neurosurgery.  Would you like to see them?  On the scan there is also a 4.3 cm adrenal mass noted, this is area above kidneys which helps control many of bodies functions.  It is recommend to further assess this via imaging of abdomen, most likely abdominal CT would benefit this.  Would you like to have this done?  Please let me know.  I tried contacting you via telephone twice and left a general message to call back.  Thank you:)

## 2020-04-04 ENCOUNTER — Other Ambulatory Visit: Payer: Self-pay | Admitting: Nurse Practitioner

## 2020-04-04 DIAGNOSIS — G8929 Other chronic pain: Secondary | ICD-10-CM

## 2020-04-04 DIAGNOSIS — E278 Other specified disorders of adrenal gland: Secondary | ICD-10-CM

## 2020-04-04 NOTE — Progress Notes (Signed)
Patient returned call- she was notified of letter and content. She would like to go ahead with referral to neurosurgery to discuss options and she would like to have the CT ordered to assess new finding mass.

## 2020-04-05 ENCOUNTER — Telehealth: Payer: Self-pay | Admitting: Nurse Practitioner

## 2020-04-05 NOTE — Telephone Encounter (Signed)
last filled 03/12/20 for 8 week worth  Active med list: yes Future visit: yes RF due no Med cannot be delegated to NT to RF or refused-  NT refused then routed to office

## 2020-04-08 ENCOUNTER — Other Ambulatory Visit: Payer: Self-pay | Admitting: Nurse Practitioner

## 2020-04-08 ENCOUNTER — Telehealth: Payer: Self-pay | Admitting: Nurse Practitioner

## 2020-04-08 MED ORDER — TRAMADOL HCL 50 MG PO TABS: 50.0000 mg | ORAL_TABLET | Freq: Two times a day (BID) | ORAL | 0 refills | Status: AC | PRN

## 2020-04-08 NOTE — Telephone Encounter (Signed)
Have sent in short refill on Tramadol, she last filled this 03/11/20.  Alert her to use only as needed and to keep appointment with pain management.

## 2020-04-08 NOTE — Telephone Encounter (Signed)
Spoke with pt and she stated that she would like to know if she can have pain meds sent in to help het with her back pain. Pt has moved her 6 week fu up to next week so she can discuss this issue. An appt has been scheduled for pain management on 05/06/2020 but she would like to know if she can have meds to relieve pain until she is able to get in.

## 2020-04-08 NOTE — Telephone Encounter (Signed)
Pt.notified

## 2020-04-08 NOTE — Telephone Encounter (Signed)
Pt had MRI done and was referred to a pain specialist and a surgeon in October/ Pt states she is in pain and wanted to know if Jolene can send in a Rx for her severe back pain /Pt states is is especially when working 12 shift/ Pt states ibuprofen and tylenol has not worked / Pt asked if this can be called in today so she can work tomorrow/ please advise

## 2020-04-09 ENCOUNTER — Other Ambulatory Visit: Payer: Self-pay | Admitting: Nurse Practitioner

## 2020-04-09 DIAGNOSIS — E278 Other specified disorders of adrenal gland: Secondary | ICD-10-CM

## 2020-04-10 ENCOUNTER — Other Ambulatory Visit: Payer: Self-pay | Admitting: Nurse Practitioner

## 2020-04-10 DIAGNOSIS — R9389 Abnormal findings on diagnostic imaging of other specified body structures: Secondary | ICD-10-CM

## 2020-04-16 ENCOUNTER — Telehealth: Payer: Self-pay | Admitting: Nurse Practitioner

## 2020-04-16 NOTE — Telephone Encounter (Signed)
Called and clarified with Morey Hummingbird. She states that she reentered the correct order and just needs the order to be cosigned in Jolene's box.

## 2020-04-16 NOTE — Telephone Encounter (Signed)
Signed this

## 2020-04-16 NOTE — Telephone Encounter (Signed)
Copied from [place] [id number]. Topic: General - Other >> Apr 16, 2020 12:06 PM [name] wrote: Reason for CRM: Morey Hummingbird with [facility] called to request a new order for patient CT scan that was ordered by [facility]. Per Colletta [place] CT technologist she need a new order for CT without contrast. Patient is  Scheduled for Monday 04/21/20 Please call Morey Hummingbird at  Ph# [phone]

## 2020-04-17 ENCOUNTER — Other Ambulatory Visit: Payer: Self-pay

## 2020-04-17 ENCOUNTER — Ambulatory Visit: Payer: BC Managed Care – PPO | Admitting: Unknown Physician Specialty

## 2020-04-17 ENCOUNTER — Ambulatory Visit: Payer: BC Managed Care – PPO | Admitting: Nurse Practitioner

## 2020-04-17 ENCOUNTER — Encounter: Payer: Self-pay | Admitting: Unknown Physician Specialty

## 2020-04-17 VITALS — BP 109/84 | HR 73 | Temp 98.1°F | Wt 265.0 lb

## 2020-04-17 DIAGNOSIS — Z566 Other physical and mental strain related to work: Secondary | ICD-10-CM | POA: Diagnosis not present

## 2020-04-17 DIAGNOSIS — M545 Low back pain: Secondary | ICD-10-CM

## 2020-04-17 DIAGNOSIS — M069 Rheumatoid arthritis, unspecified: Secondary | ICD-10-CM | POA: Diagnosis not present

## 2020-04-17 DIAGNOSIS — G8929 Other chronic pain: Secondary | ICD-10-CM

## 2020-04-17 DIAGNOSIS — Z23 Encounter for immunization: Secondary | ICD-10-CM | POA: Diagnosis not present

## 2020-04-17 MED ORDER — MELOXICAM 15 MG PO TABS: 15.0000 mg | ORAL_TABLET | Freq: Every day | ORAL | 0 refills | Status: DC

## 2020-04-17 NOTE — Assessment & Plan Note (Signed)
Work is too physically demanding for her.  Will need to consider disability at some time.  States she can't take off any time from work

## 2020-04-17 NOTE — Assessment & Plan Note (Addendum)
Encouraged to f/u with rheumatologist as RA also seems to be getting worse.

## 2020-04-17 NOTE — Progress Notes (Signed)
BP 109/84   Pulse 73   Temp 98.1 F (36.7 C) (Oral)   Wt 265 lb (120.2 kg)   SpO2 97%   BMI 40.29 kg/m    Subjective:    Patient ID: Erin Contreras, female    DOB: [birth year], 59 y.o.   MRN: [id number]  HPI: Erin Contreras is a 59 y.o. female  Chief Complaint  Patient presents with  . Back Pain   Pt with ongoing back pain.  MRI was done showing multiple level disc bulging and also had an adrenal mass currently being worked up with a CT.  Pt being followed by rheumatology for RA and taking Embrel.  She admits to missing the last couple of appointments.  She has a lot of knee and hand joint pain from her RA. Points from rt lower back to buttocks as having severe pain.   It seems that her job is too physically strenuous for her at this point. She is lifting, using hands, and bending for her work.  Appt pending for pain clinic. She wants to know what to do in the meantime.  Not taking Ibuprofen.     Relevant past medical, surgical, family and social history reviewed and updated as indicated. Interim medical history since our last visit reviewed. Allergies and medications reviewed and updated.  Review of Systems  Per HPI unless specifically indicated above     Objective:    BP 109/84   Pulse 73   Temp 98.1 F (36.7 C) (Oral)   Wt 265 lb (120.2 kg)   SpO2 97%   BMI 40.29 kg/m   Wt Readings from Last 3 Encounters:  04/17/20 265 lb (120.2 kg)  03/11/20 263 lb 6.4 oz (119.5 kg)  02/07/20 262 lb (118.8 kg)    Physical Exam Constitutional:      General: She is not in acute distress.    Appearance: Normal appearance. She is well-developed.  HENT:     Head: Normocephalic and atraumatic.  Eyes:     General: Lids are normal. No scleral icterus.       Right eye: No discharge.        Left eye: No discharge.     Conjunctiva/sclera: Conjunctivae normal.  Cardiovascular:     Rate and Rhythm: Normal rate.  Pulmonary:     Effort: Pulmonary effort is normal.  Abdominal:      Palpations: There is no hepatomegaly or splenomegaly.  Musculoskeletal:        General: Normal range of motion.  Skin:    Coloration: Skin is not pale.     Findings: No rash.  Neurological:     Mental Status: She is alert and oriented to person, place, and time.  Psychiatric:        Behavior: Behavior normal.        Thought Content: Thought content normal.        Judgment: Judgment normal.     Results for orders placed or performed in visit on 93/23/55  Basic metabolic panel  Result Value Ref Range   Glucose 91 65 - 99 mg/dL   BUN 15 8 - 27 mg/dL   Creatinine, Ser 0.67 0.57 - 1.00 mg/dL   GFR calc non Af Amer 96 >59 mL/min/1.73   GFR calc Af Amer 110 >59 mL/min/1.73   BUN/Creatinine Ratio 22 12 - 28   Sodium 142 134 - 144 mmol/L   Potassium 4.3 3.5 - 5.2 mmol/L   Chloride 107 (H) 96 -  106 mmol/L   CO2 24 20 - 29 mmol/L   Calcium 9.2 8.7 - 10.3 mg/dL  VITAMIN D 25 Hydroxy (Vit-D Deficiency, Fractures)  Result Value Ref Range   Vit D, 25-Hydroxy 15.1 (L) 30.0 - 100.0 ng/mL      Assessment & Plan:   Problem List Items Addressed This Visit      Unprioritized   Low back pain    Appt pending for pain clinic.  In the meantime, will start Meloxicam 15 mg daily to take with food. Increase Gabapentin to QID.         Relevant Medications   meloxicam (MOBIC) 15 MG tablet   RA (rheumatoid arthritis) ([place])    Encouraged to f/u with rheumatologist as RA also seems to be getting worse.         Relevant Medications   meloxicam (MOBIC) 15 MG tablet   Work stress    Work is too physically demanding for her.  Will need to consider disability at some time.  States she can't take off any time from work       Other Visit Diagnoses    Flu vaccine need    -  Primary   Relevant Orders   Flu Vaccine QUAD 36+ mos IM (Completed)       Follow up plan: Return if symptoms worsen or fail to improve.

## 2020-04-17 NOTE — Assessment & Plan Note (Addendum)
Appt pending for pain clinic.  In the meantime, will start Meloxicam 15 mg daily to take with food. Increase Gabapentin to QID.

## 2020-04-21 ENCOUNTER — Ambulatory Visit
Admission: RE | Admit: 2020-04-21 | Discharge: 2020-04-21 | Disposition: A | Discharge status: Home / Self Care | Payer: BC Managed Care – PPO | Source: Ambulatory Visit | Attending: Nurse Practitioner | Admitting: Nurse Practitioner

## 2020-04-21 ENCOUNTER — Other Ambulatory Visit: Payer: Self-pay

## 2020-04-21 DIAGNOSIS — K449 Diaphragmatic hernia without obstruction or gangrene: Secondary | ICD-10-CM | POA: Diagnosis not present

## 2020-04-21 DIAGNOSIS — K429 Umbilical hernia without obstruction or gangrene: Secondary | ICD-10-CM | POA: Diagnosis not present

## 2020-04-21 DIAGNOSIS — Z9049 Acquired absence of other specified parts of digestive tract: Secondary | ICD-10-CM | POA: Diagnosis not present

## 2020-04-21 DIAGNOSIS — E278 Other specified disorders of adrenal gland: Secondary | ICD-10-CM | POA: Diagnosis not present

## 2020-04-21 NOTE — Progress Notes (Signed)
Please let Erin Contreras know her imaging has returned, adrenal mass, this is a gland above the kidney that produces hormones that assist various bodily functions.  The mass is slightly larger than previous imaging in  2007, but on report is suspected to be adenoma, a more benign finding.  However, you may benefit from a referral to endocrinology for further work-up, this may be a beneficial step to ensure no other underlying issues present.  Would you like to pursue this referral?  If so let me know and I will order.  You also have a small hiatal hernia, this means a small outpouching in upper stomach which can at times cause issues with heart burn.  We will monitor this, but no interventions needed at this time.  Have a good day.

## 2020-04-22 ENCOUNTER — Other Ambulatory Visit: Payer: Self-pay | Admitting: Nurse Practitioner

## 2020-04-22 ENCOUNTER — Ambulatory Visit: Payer: BC Managed Care – PPO | Admitting: Nurse Practitioner

## 2020-04-22 DIAGNOSIS — E278 Other specified disorders of adrenal gland: Secondary | ICD-10-CM

## 2020-04-24 DIAGNOSIS — M0579 Rheumatoid arthritis with rheumatoid factor of multiple sites without organ or systems involvement: Secondary | ICD-10-CM | POA: Diagnosis not present

## 2020-04-24 DIAGNOSIS — M170 Bilateral primary osteoarthritis of knee: Secondary | ICD-10-CM | POA: Diagnosis not present

## 2020-04-24 DIAGNOSIS — M069 Rheumatoid arthritis, unspecified: Secondary | ICD-10-CM | POA: Diagnosis not present

## 2020-04-24 DIAGNOSIS — Z79899 Other long term (current) drug therapy: Secondary | ICD-10-CM | POA: Diagnosis not present

## 2020-05-06 ENCOUNTER — Ambulatory Visit: Payer: BC Managed Care – PPO | Admitting: Student in an Organized Health Care Education/Training Program

## 2020-05-06 ENCOUNTER — Ambulatory Visit
Admission: RE | Admit: 2020-05-06 | Discharge: 2020-05-06 | Disposition: A | Discharge status: Home / Self Care | Payer: BC Managed Care – PPO | Attending: Student in an Organized Health Care Education/Training Program | Admitting: Student in an Organized Health Care Education/Training Program

## 2020-05-06 ENCOUNTER — Other Ambulatory Visit: Payer: Self-pay | Admitting: Nurse Practitioner

## 2020-05-06 ENCOUNTER — Other Ambulatory Visit: Payer: Self-pay

## 2020-05-06 ENCOUNTER — Ambulatory Visit
Admission: RE | Admit: 2020-05-06 | Discharge: 2020-05-06 | Disposition: A | Discharge status: Home / Self Care | Payer: BC Managed Care – PPO | Source: Ambulatory Visit | Attending: Student in an Organized Health Care Education/Training Program | Admitting: Student in an Organized Health Care Education/Training Program

## 2020-05-06 ENCOUNTER — Encounter: Payer: Self-pay | Admitting: Student in an Organized Health Care Education/Training Program

## 2020-05-06 VITALS — BP 134/83 | HR 71 | Temp 97.3°F | Resp 18 | Ht 69.0 in | Wt 263.0 lb

## 2020-05-06 DIAGNOSIS — M47816 Spondylosis without myelopathy or radiculopathy, lumbar region: Secondary | ICD-10-CM

## 2020-05-06 DIAGNOSIS — M5136 Other intervertebral disc degeneration, lumbar region: Secondary | ICD-10-CM | POA: Insufficient documentation

## 2020-05-06 DIAGNOSIS — M533 Sacrococcygeal disorders, not elsewhere classified: Secondary | ICD-10-CM | POA: Diagnosis not present

## 2020-05-06 DIAGNOSIS — G894 Chronic pain syndrome: Secondary | ICD-10-CM | POA: Insufficient documentation

## 2020-05-06 DIAGNOSIS — M47817 Spondylosis without myelopathy or radiculopathy, lumbosacral region: Secondary | ICD-10-CM | POA: Diagnosis not present

## 2020-05-06 DIAGNOSIS — M47818 Spondylosis without myelopathy or radiculopathy, sacral and sacrococcygeal region: Secondary | ICD-10-CM | POA: Insufficient documentation

## 2020-05-06 DIAGNOSIS — G8929 Other chronic pain: Secondary | ICD-10-CM | POA: Insufficient documentation

## 2020-05-06 DIAGNOSIS — M5416 Radiculopathy, lumbar region: Secondary | ICD-10-CM

## 2020-05-06 DIAGNOSIS — M461 Sacroiliitis, not elsewhere classified: Secondary | ICD-10-CM | POA: Insufficient documentation

## 2020-05-06 MED ORDER — TIZANIDINE HCL 4 MG PO TABS: 4.0000 mg | ORAL_TABLET | Freq: Every evening | ORAL | 1 refills | Status: AC | PRN

## 2020-05-06 NOTE — Progress Notes (Signed)
Patient: Erin Contreras  Service Category: E/M  Provider: Gillis Santa, MD  DOB: 11-07-1960  DOS: 05/06/2020  Referring Provider: Venita Lick, NP  MRN: [id number]  Setting: Ambulatory outpatient  PCP: Venita Lick, NP  Type: New Patient  Specialty: Interventional Pain Management    Location: Office  Delivery: Face-to-face     Primary Reason(s) for Visit: Encounter for initial evaluation of one or more chronic problems (new to examiner) potentially causing chronic pain, and posing a threat to normal musculoskeletal function. (Level of risk: High) CC: Back Pain  HPI  Erin Contreras is a 59 y.o. year old, female patient, who comes for the first time to our practice referred by Marnee Guarneri T, NP for our initial evaluation of her chronic pain. She has OA (osteoarthritis) of knee; RA (rheumatoid arthritis) ([place]); Obesity; Urge and stress incontinence; Low back pain; Herpes simplex virus infection; Obstructive sleep apnea of adult; Multiple nodules of lung; Gastroesophageal reflux disease with esophagitis; Benign hypertension; Eczema; Vitamin D deficiency; Insomnia; HPV (human papilloma virus) infection; Prediabetes; Vaginal itching; Lab test positive for detection of COVID-19 virus; Leukocytes in urine; Work stress; Lumbar radiculopathy; Lumbar spondylosis; Lumbar degenerative disc disease; Sacroiliac joint pain; SI joint arthritis; and Chronic pain syndrome on their problem list. Today she comes in for evaluation of her Back Pain  Pain Assessment: Location: Lower Back Radiating: radiates down right hip, right leg on the side to knee Onset: More than a month ago Duration: Chronic pain Quality: Aching Severity: 7 /10 (subjective, self-reported pain score)  Effect on ADL: limits being up moving Timing: Intermittent Modifying factors: sitting BP: 134/83  HR: 71  Onset and Duration: Present longer than 3 months Cause of pain: Unknown Severity: Getting worse and NAS-11 at its worse:  10/10 Timing: Morning, Afternoon, Night and During activity or exercise Aggravating Factors: Walking, Walking uphill and Working Alleviating Factors: Sitting Associated Problems: Night-time cramps, Fatigue, Inability to control bladder (urine), Numbness, Swelling, Tingling, Weakness, Pain that wakes patient up and Pain that does not allow patient to sleep Quality of Pain: Aching, Cramping, Deep, Getting longer, Heavy, Horrible, Pressure-like, Sharp, Shooting, Tender, Throbbing, Tingling, Toothache-like and Uncomfortable Previous Examinations or Tests: CT scan, MRI scan and X-rays Previous Treatments: Physical Therapy   Erin Contreras is a pleasant 59 year old female with a past medical history of rheumatoid arthritis (followed by rheumatology on methotrexate and daily prednisone) who presents with a chief complaint of low back pain that radiates into her right buttock down her posterior lateral hip into her right leg.  This is been going on for more than 3 months.  It is chronic in nature.  No inciting or traumatic event.  She has done physical therapy in the past on multiple occasions without any significant benefit.  Patient is morbidly obese.  She has been counseled on the significance of losing weight especially in relation to her chronic pain.  She is on gabapentin 300 mg 3 times a day but she states that she does not take this medication regularly.  She also alternates between ibuprofen and meloxicam.  I informed her to discontinue the meloxicam and only take ibuprofen as needed.  She denies having done any injections for her current symptoms.  No bowel or bladder dysfunction.  Of note she has trialed tramadol in the past and did not endorse noticeable analgesic benefit from this.   Historic Controlled Substance Pharmacotherapy Review  Historical Monitoring: The patient  reports no history of drug use. List of all UDS Test(s):  No results found for: MDMA, COCAINSCRNUR, PCPSCRNUR, PCPQUANT, CANNABQUANT,  THCU, [place] List of other Serum/Urine Drug Screening Test(s):  No results found for: AMPHSCRSER, BARBSCRSER, BENZOSCRSER, COCAINSCRSER, COCAINSCRNUR, PCPSCRSER, PCPQUANT, THCSCRSER, THCU, CANNABQUANT, OPIATESCRSER, OXYSCRSER, PROPOXSCRSER, ETH Historical Background Evaluation: [place] PMP: PDMP not reviewed this encounter. Online review of the past 52-monthperiod conducted.               Department of public safety, offender search: (Editor, commissioningInformation) Non-contributory Risk Assessment Profile: Aberrant behavior: None observed or detected today Risk factors for fatal opioid overdose: None identified today Fatal overdose hazard ratio (HR): Calculation deferred Non-fatal overdose hazard ratio (HR): Calculation deferred Risk of opioid abuse or dependence: 0.7-3.0% with doses ? 36 MME/day and 6.1-26% with doses ? 120 MME/day. Substance use disorder (SUD) risk level: See below Personal History of Substance Abuse (SUD-Substance use disorder):  Alcohol: Negative  Illegal Drugs: Negative  Rx Drugs: Negative  ORT Risk Level calculation: Low Risk  Opioid Risk Tool - 05/06/20 1009      Family History of Substance Abuse   Alcohol Negative    Illegal Drugs Negative    Rx Drugs Negative      Personal History of Substance Abuse   Alcohol Negative    Illegal Drugs Negative    Rx Drugs Negative      Age   Age between 125-45years  No      History of Preadolescent Sexual Abuse   History of Preadolescent Sexual Abuse Negative or Female      Psychological Disease   Psychological Disease Negative    Depression Negative      Total Score   Opioid Risk Tool Scoring 0    Opioid Risk Interpretation Low Risk          ORT Scoring interpretation table:  Score <3 = Low Risk for SUD  Score between 4-7 = Moderate Risk for SUD  Score >8 = High Risk for Opioid Abuse   PHQ-2 Depression Scale:  Total score: 0  PHQ-2 Scoring interpretation table: (Score and probability of major depressive disorder)   Score 0 = No depression  Score 1 = 15.4% Probability  Score 2 = 21.1% Probability  Score 3 = 38.4% Probability  Score 4 = 45.5% Probability  Score 5 = 56.4% Probability  Score 6 = 78.6% Probability   PHQ-9 Depression Scale:  Total score: 0  PHQ-9 Scoring interpretation table:  Score 0-4 = No depression  Score 5-9 = Mild depression  Score 10-14 = Moderate depression  Score 15-19 = Moderately severe depression  Score 20-27 = Severe depression (2.4 times higher risk of SUD and 2.89 times higher risk of overuse)   Pharmacologic Plan: Erin Contreras having a preference to stay away from opioid analgesics.            Initial impression: Erin Contreras having no interest in opioid therapy, at this point.  Meds   Current Outpatient Medications:  .  acetaminophen (TYLENOL 8 HOUR ARTHRITIS PAIN) 650 MG CR tablet, Take 650 mg by mouth every 8 (eight) hours as needed for pain., Disp: , Rfl:  .  acyclovir (ZOVIRAX) 400 MG tablet, TAKE 1 TABLET (400 MG TOTAL) BY MOUTH 2 (TWO) TIMES DAILY AS NEEDED., Disp: 180 tablet, Rfl: 0 .  albuterol (VENTOLIN HFA) 108 (90 Base) MCG/ACT inhaler, Inhale 2 puffs into the lungs every 6 (six) hours as needed for wheezing or shortness of breath., Disp: 18 g, Rfl: 1 .  Cholecalciferol (VITAMIN D3)  1.25 MG (50000 UT) CAPS, Take by mouth., Disp: , Rfl:  .  etanercept (ENBREL) 50 MG/ML injection, Inject 50 mg into the skin once a week., Disp: , Rfl:  .  folic acid (FOLVITE) 1 MG tablet, Take 1 mg by mouth daily., Disp: , Rfl:  .  gabapentin (NEURONTIN) 300 MG capsule, Take 1 capsule (300 mg total) by mouth 3 (three) times daily., Disp: 270 capsule, Rfl: 3 .  ibuprofen (ADVIL) 800 MG tablet, Take 800 mg by mouth 3 (three) times daily., Disp: , Rfl:  .  methotrexate (RHEUMATREX) 2.5 MG tablet, TAKE 6 TABLETS (15 MG) BY MOUTH ONCE A WEEK (SAME DAY EACH WEEK), Disp: , Rfl:  .  predniSONE (DELTASONE) 5 MG tablet, Take by mouth., Disp: , Rfl:  .  traZODone  (DESYREL) 100 MG tablet, Take 1 tablet (100 mg total) by mouth at bedtime., Disp: 90 tablet, Rfl: 3 .  tiZANidine (ZANAFLEX) 4 MG tablet, Take 1 tablet (4 mg total) by mouth at bedtime as needed for muscle spasms., Disp: 30 tablet, Rfl: 1  Imaging Review  Lumbosacral Imaging: Lumbar MR wo contrast: Results for orders placed during the hospital encounter of 03/29/20  MR Lumbar Spine Wo Contrast  Narrative CLINICAL DATA:  Initial evaluation for low back pain with right hip, buttock, and leg pain.  EXAM: MRI LUMBAR SPINE WITHOUT CONTRAST  TECHNIQUE: Multiplanar, multisequence MR imaging of the lumbar spine was performed. No intravenous contrast was administered.  COMPARISON:  None available.  FINDINGS: Segmentation:  Standard.  Alignment: 2-3 mm anterolisthesis of L2 on L3, L3 on L4, and L4 on L5, chronic and facet mediated. Trace dextroscoliosis.  Vertebrae: Vertebral body height maintained without acute or chronic fracture. Bone marrow signal intensity within normal limits. Few scattered subcentimeter benign hemangiomata noted. No worrisome osseous lesions. No abnormal marrow edema.  Conus medullaris and cauda equina: Conus extends to the T12-L1 level. Conus and cauda equina appear normal.  Paraspinal and other soft tissues: Paraspinous soft tissues within normal limits. 4.3 cm right adrenal mass, partially visualized. Remainder the visualized visceral structures grossly within normal limits.  Disc levels:  T12-L1: Broad-based right extraforaminal disc protrusion (series 8, image 8). Finding could potentially affect the exiting nerve root. Mild to moderate facet hypertrophy. No spinal stenosis. Mild right foraminal narrowing.  L1-2: Disc desiccation without significant disc bulge. Moderate facet hypertrophy. No stenosis or impingement.  L2-3: Diffuse disc bulge with disc desiccation. Superimposed left subarticular disc protrusion with slight superior  angulation. Associated annular fissure. Moderate facet hypertrophy. Resultant mild canal with moderate left lateral recess stenosis. Mild bilateral foraminal stenosis.  L3-4: Mild disc bulge with disc desiccation. Associated small right paracentral annular fissure. Moderate to advanced facet degeneration, slightly worse on the left. Resultant mild narrowing of the lateral recesses. Central canal remains patent. Mild left foraminal narrowing. No significant right foraminal encroachment.  L4-5: Disc bulge with disc desiccation. Superimposed small central disc protrusion with annular fissure. Moderate to advanced facet hypertrophy, right slightly worse than left. Resultant mild bilateral lateral recess stenosis. Central canal remains patent. Mild right worse than left L4 foraminal narrowing.  L5-S1: Disc bulge with disc desiccation and intervertebral disc space narrowing. Reactive endplate change. Moderate right worse than left facet hypertrophy. No significant spinal stenosis. Severe right with mild left L5 foraminal narrowing.  IMPRESSION: 1. Disc bulge with reactive endplate change and facet hypertrophy at L5-S1 with resultant severe right L5 foraminal stenosis. Finding could contribute to right lower extremity symptoms. 2. Broad right  extraforaminal disc protrusion at T12-L1, potentially affecting the exiting right T12 nerve root. 3. Disc bulge with superimposed left subarticular disc protrusion at L2-3 with resultant moderate left lateral recess stenosis. 4. Moderate to advanced multilevel facet degeneration throughout the lumbar spine with resultant mild bilateral lateral recess narrowing at L3-4 and L4-5. 5. 4.3 cm right adrenal mass, partially visualized, and indeterminate. Further evaluation with dedicated cross-sectional imaging of the abdomen recommended for further evaluation.   Electronically Signed By: Jeannine Boga M.D. On: 03/31/2020 05:33 DG Lumbar Spine  Complete  Narrative CLINICAL DATA:  Chronic left-sided low back pain with left-sided sciatica. Acute back pain with radiculopathy down left side.  EXAM: LUMBAR SPINE - COMPLETE 4+ VIEW  COMPARISON:  Radiographs of the lumbar spine 10/13/2012  FINDINGS: Five lumbar vertebrae. Trace T12-L1 and L1-L2 retrolisthesis. 2 mm L3-L4 anterolisthesis. No compression deformity. No convincing pars interarticularis defect on oblique radiographs. Multilevel disc height loss has slightly progressed since prior examination 10/13/2012. Disc height loss is greatest at L4-L5 and L5-S1 (mild/moderate). Multilevel facet arthrosis greatest within the lower lumbar spine.  IMPRESSION: Multilevel disc space narrowing appears slightly progressed as compared to prior examination 10/13/2012, greatest at L4-L5 and L5-S1.  Lumbar spondylosis greatest within the lower lumbar spine.  No compression deformity.   Electronically Signed By: Kellie Simmering DO On: 09/17/2019 09:56        DG Knee Complete 4 Views Right  Narrative CLINICAL DATA:  Acute onset of right knee pain and swelling. Initial encounter.  EXAM: RIGHT KNEE - COMPLETE 4+ VIEW  COMPARISON:  Right knee radiographs performed 09/27/2014  FINDINGS: There is no evidence of fracture or dislocation. There is narrowing of the patellofemoral compartment. Marginal osteophytes are seen arising at all 3 compartments. Tibial spine osteophytes are also seen. Mild cortical irregularity is noted along the medial tibial plateau.  No significant joint effusion is seen. The visualized soft tissues are normal in appearance.  IMPRESSION: 1. No evidence of fracture or dislocation. 2. Mild tricompartmental osteoarthritis. Narrowing of the patellofemoral compartment.   Electronically Signed By: Garald Balding M.D. On: 03/14/2017 00:35   Narrative CLINICAL DATA:  LEFT knee pain for 3 weeks. No known injury. Initial encounter.  EXAM: LEFT KNEE -  COMPLETE 4+ VIEW  COMPARISON:  None.  FINDINGS: No acute fracture or dislocation.  Mild LATERAL subluxation at the tibiotalar joint noted.  Joint space narrowing osteophytosis noted, severe in the patellofemoral compartment.  A small joint effusion is present.  No suspicious focal bony lesions are identified.  IMPRESSION: 1. Small joint effusion without acute bony abnormality. 2. Tricompartmental degenerative changes, severe in the patellofemoral compartment.   Electronically Signed By: Margarette Canada M.D. On: 09/30/2019 09:23  Narrative CLINICAL DATA:  Acute onset of right ankle pain and swelling. Initial encounter.  EXAM: RIGHT ANKLE - COMPLETE 3+ VIEW  COMPARISON:  None.  FINDINGS: There is no evidence of fracture or dislocation. The ankle mortise is intact; the interosseous space is within normal limits. No talar tilt or subluxation is seen. Mild degenerative change is noted at the midfoot. A plantar calcaneal spur is noted.  An ankle joint effusion is noted. Mild calcification along the plantar fascia may reflect chronic changes of plantar fasciitis, or possibly prior injury.  IMPRESSION: 1. No evidence of fracture or dislocation. 2. Ankle joint effusion noted.   Electronically Signed By: Garald Balding M.D. On: 03/14/2017 00:36    Complexity Note: Imaging results reviewed. Results shared with Erin Contreras, using [employer]  terms.                         ROS  Cardiovascular: No reported cardiovascular signs or symptoms such as High blood pressure, coronary artery disease, abnormal heart rate or rhythm, heart attack, blood thinner therapy or heart weakness and/or failure Pulmonary or Respiratory: No reported pulmonary signs or symptoms such as wheezing and difficulty taking a deep full breath (Asthma), difficulty blowing air out (Emphysema), coughing up mucus (Bronchitis), persistent dry cough, or temporary stoppage of breathing during sleep Neurological: No  reported neurological signs or symptoms such as seizures, abnormal skin sensations, urinary and/or fecal incontinence, being born with an abnormal open spine and/or a tethered spinal cord Psychological-Psychiatric: No reported psychological or psychiatric signs or symptoms such as difficulty sleeping, anxiety, depression, delusions or hallucinations (schizophrenial), mood swings (bipolar disorders) or suicidal ideations or attempts Gastrointestinal: Reflux or heatburn Genitourinary: Recurrent Urinary Tract infections Hematological: Brusing easily Endocrine: No reported endocrine signs or symptoms such as high or low blood sugar, rapid heart rate due to high thyroid levels, obesity or weight gain due to slow thyroid or thyroid disease Rheumatologic: Rheumatoid arthritis Musculoskeletal: Negative for myasthenia gravis, muscular dystrophy, multiple sclerosis or malignant hyperthermia Work History: Working full time  Allergies  Erin Contreras has No Known Allergies.  Laboratory Chemistry Profile   Renal Lab Results  Component Value Date   BUN 15 03/11/2020   CREATININE 0.67 03/11/2020   BCR 22 03/11/2020   GFRAA 110 03/11/2020   GFRNONAA 96 03/11/2020   SPECGRAV 1.025 02/07/2020   PHUR 5.5 02/07/2020   PROTEINUR Negative 02/07/2020     Electrolytes Lab Results  Component Value Date   NA 142 03/11/2020   K 4.3 03/11/2020   CL 107 (H) 03/11/2020   CALCIUM 9.2 03/11/2020   MG 2.0 09/04/2014   PHOS 3.7 09/04/2014     Hepatic Lab Results  Component Value Date   AST 27 03/21/2019   ALT 21 03/21/2019   ALBUMIN 4.2 03/21/2019   ALKPHOS 92 03/21/2019   LIPASE 106 05/10/2014     ID Lab Results  Component Value Date   STAPHAUREUS NEGATIVE 06/29/2017   MRSAPCR NEGATIVE 06/29/2017     Bone Lab Results  Component Value Date   VD25OH 15.1 (L) 03/11/2020     Endocrine Lab Results  Component Value Date   GLUCOSE 91 03/11/2020   GLUCOSEU Negative 02/07/2020   HGBA1C 5.9 (H)  03/21/2019   TSH 0.583 03/21/2019     Neuropathy Lab Results  Component Value Date   HGBA1C 5.9 (H) 03/21/2019     CNS No results found for: COLORCSF, APPEARCSF, RBCCOUNTCSF, WBCCSF, POLYSCSF, LYMPHSCSF, EOSCSF, PROTEINCSF, GLUCCSF, JCVIRUS, CSFOLI, IGGCSF, LABACHR, ACETBL, LABACHR, ACETBL   Inflammation (CRP: Acute  ESR: Chronic) Lab Results  Component Value Date   CRP <1 03/17/2018   ESRSEDRATE 19 03/17/2018     Rheumatology Lab Results  Component Value Date   ANA Negative 03/17/2018     Coagulation Lab Results  Component Value Date   INR 0.90 06/29/2017   LABPROT 12.1 06/29/2017   APTT 26 06/29/2017   PLT 259 03/21/2019     Cardiovascular Lab Results  Component Value Date   HGB 13.0 03/21/2019   HCT 39.8 03/21/2019     Screening Lab Results  Component Value Date   STAPHAUREUS NEGATIVE 06/29/2017   MRSAPCR NEGATIVE 06/29/2017     Cancer No results found for: CEA, CA125, LABCA2   Allergens  No results found for: ALMOND, APPLE, ASPARAGUS, AVOCADO, BANANA, BARLEY, BASIL, BAYLEAF, GREENBEAN, LIMABEAN, WHITEBEAN, BEEFIGE, REDBEET, BLUEBERRY, BROCCOLI, CABBAGE, MELON, CARROT, CASEIN, CASHEWNUT, CAULIFLOWER, CELERY     Note: Lab results reviewed.  [place]  Drug: Erin Contreras  reports no history of drug use. Alcohol:  reports no history of alcohol use. Tobacco:  reports that she has never smoked. She has never used smokeless tobacco. Medical:  has a past medical history of Boils, Ear mass, Hemorrhoids, Herpes simplex virus infection, HSV infection, Lumbago with sciatica, OA (osteoarthritis) of knee, Obesity, Overactive bladder, PONV (postoperative nausea and vomiting), RA (rheumatoid arthritis) ([place]), Sleep apnea, and Urge and stress incontinence. Family: family history includes Cancer in her mother; Cirrhosis in her father; Diabetes in her mother, paternal grandfather, and paternal grandmother; Heart disease in her paternal grandfather and paternal grandmother;  Hyperlipidemia in her mother; Hypertension in her daughter and mother; Stroke in her maternal grandfather and maternal grandmother.  Past Surgical History:  Procedure Laterality Date  . CHOLECYSTECTOMY  2011  . FRACTURE SURGERY Right    as a child  . KNEE ARTHROPLASTY Right 07/11/2017   Procedure: COMPUTER ASSISTED TOTAL KNEE ARTHROPLASTY;  Surgeon: Dereck Leep, MD;  Location: [facility] ORS;  Service: Orthopedics;  Laterality: Right;  . SLEEVE GASTROPLASTY    . TUMOR REMOVAL Right 2010   ear   Active Ambulatory Problems    Diagnosis Date Noted  . OA (osteoarthritis) of knee   . RA (rheumatoid arthritis) ([place])   . Obesity   . Urge and stress incontinence   . Low back pain   . Herpes simplex virus infection 03/17/2017  . Obstructive sleep apnea of adult 04/27/2017  . Multiple nodules of lung 04/27/2017  . Gastroesophageal reflux disease with esophagitis 05/24/2014  . Benign hypertension 07/11/2017  . Eczema 12/30/2017  . Vitamin D deficiency 12/30/2017  . Insomnia 12/30/2017  . HPV (human papilloma virus) infection 02/03/2018  . Prediabetes 03/21/2019  . Vaginal itching 03/21/2019  . Lab test positive for detection of COVID-19 virus 09/13/2019  . Leukocytes in urine 02/07/2020  . Work stress 04/17/2020  . Lumbar radiculopathy 05/06/2020  . Lumbar spondylosis 05/06/2020  . Lumbar degenerative disc disease 05/06/2020  . Sacroiliac joint pain 05/06/2020  . SI joint arthritis 05/06/2020  . Chronic pain syndrome 05/06/2020   Resolved Ambulatory Problems    Diagnosis Date Noted  . Rectal bleeding 01/17/2013  . Knee pain, right 12/02/2015  . Pre-ulcerative corn or callous 12/02/2015  . Piriformis syndrome of right side 12/23/2015  . Status post total right knee replacement 07/11/2017  . Fatigue 12/30/2017   Past Medical History:  Diagnosis Date  . Boils   . Ear mass   . Hemorrhoids   . HSV infection   . Lumbago with sciatica   . Overactive bladder   . PONV  (postoperative nausea and vomiting)   . Sleep apnea    Constitutional Exam  General appearance: alert, cooperative and mildly obese Vitals:   05/06/20 1004  BP: 134/83  Pulse: 71  Resp: 18  Temp: (!) 97.3 F (36.3 C)  SpO2: 99%  Weight: 263 lb (119.3 kg)  Height: _0  (1.753 m)   BMI Assessment: Estimated body mass index is 38.84 kg/m as calculated from the following:   Height as of this encounter: _1  (1.753 m).   Weight as of this encounter: 263 lb (119.3 kg).  BMI interpretation table: BMI level Category Range association with higher incidence of chronic pain  <18  kg/m2 Underweight   18.5-24.9 kg/m2 Ideal body weight   25-29.9 kg/m2 Overweight Increased incidence by 20%  30-34.9 kg/m2 Obese (Class I) Increased incidence by 68%  35-39.9 kg/m2 Severe obesity (Class II) Increased incidence by 136%  >40 kg/m2 Extreme obesity (Class III) Increased incidence by 254%   Patient's current BMI Ideal Body weight  Body mass index is 38.84 kg/m. Ideal body weight: 66.2 kg (145 lb 15.1 oz) Adjusted ideal body weight: 87.4 kg (192 lb 12.3 oz)   BMI Readings from Last 4 Encounters:  05/06/20 38.84 kg/m  04/17/20 40.29 kg/m  03/11/20 40.05 kg/m  02/07/20 39.84 kg/m   Wt Readings from Last 4 Encounters:  05/06/20 263 lb (119.3 kg)  04/17/20 265 lb (120.2 kg)  03/11/20 263 lb 6.4 oz (119.5 kg)  02/07/20 262 lb (118.8 kg)    Psych/Mental status: Alert, oriented x 3 (person, place, & time)       Eyes: PERLA Respiratory: No evidence of acute respiratory distress  Cervical Spine Exam  Skin & Axial Inspection: No masses, redness, edema, swelling, or associated skin lesions Alignment: Symmetrical Functional ROM: Unrestricted ROM      Stability: No instability detected Muscle Tone/Strength: Functionally intact. No obvious neuro-muscular anomalies detected. Sensory (Neurological): Unimpaired Palpation: No palpable anomalies              Upper Extremity (UE) Exam    Side:  Right upper extremity  Side: Left upper extremity  Skin & Extremity Inspection: Skin color, temperature, and hair growth are WNL. No peripheral edema or cyanosis. No masses, redness, swelling, asymmetry, or associated skin lesions. No contractures.  Skin & Extremity Inspection: Skin color, temperature, and hair growth are WNL. No peripheral edema or cyanosis. No masses, redness, swelling, asymmetry, or associated skin lesions. No contractures.  Functional ROM: Decreased ROM for all joints of upper extremity  Functional ROM: Decreased ROM for all joints of upper extremity  Muscle Tone/Strength: Functionally intact. No obvious neuro-muscular anomalies detected.   Muscle Tone/Strength: Functionally intact. No obvious neuro-muscular anomalies detected.  Sensory (Neurological): Musculoskeletal pain pattern          Sensory (Neurological): Musculoskeletal pain pattern          Palpation: No palpable anomalies              Palpation: No palpable anomalies              Provocative Test(s):  Phalen's test: deferred Tinel's test: deferred Apley's scratch test (touch opposite shoulder):  Action 1 (Across chest): deferred Action 2 (Overhead): deferred Action 3 (LB reach): deferred   Provocative Test(s):  Phalen's test: deferred Tinel's test: deferred Apley's scratch test (touch opposite shoulder):  Action 1 (Across chest): deferred Action 2 (Overhead): deferred Action 3 (LB reach): deferred    Thoracic Spine Area Exam  Skin & Axial Inspection: No masses, redness, or swelling Alignment: Symmetrical Functional ROM: Unrestricted ROM Stability: No instability detected Muscle Tone/Strength: Functionally intact. No obvious neuro-muscular anomalies detected. Sensory (Neurological): Unimpaired Muscle strength & Tone: No palpable anomalies  Lumbar Exam  Skin & Axial Inspection: No masses, redness, or swelling Alignment: Symmetrical Functional ROM: Pain restricted ROM       Stability: No instability  detected Muscle Tone/Strength: Functionally intact. No obvious neuro-muscular anomalies detected. Sensory (Neurological): Dermatomal pain pattern right side Palpation: No palpable anomalies       Provocative Tests: Hyperextension/rotation test: (+) bilaterally for facet joint pain. Lumbar quadrant test (Kemp's test): (+) on the right for foraminal stenosis  Lateral bending test: deferred today       Patrick's Maneuver: (+) for right-sided S-I arthralgia             FABER* test: (+) for right-sided S-I arthralgia             S-I anterior distraction/compression test: deferred today         S-I lateral compression test: deferred today         S-I Thigh-thrust test: deferred today         S-I Gaenslen's test: deferred today         *(Flexion, ABduction and External Rotation)  Gait & Posture Assessment  Ambulation: Unassisted Gait: Antalgic gait (limping) Posture: WNL   Lower Extremity Exam    Side: Right lower extremity  Side: Left lower extremity  Stability: No instability observed          Stability: No instability observed          Skin & Extremity Inspection: Skin color, temperature, and hair growth are WNL. No peripheral edema or cyanosis. No masses, redness, swelling, asymmetry, or associated skin lesions. No contractures.  Skin & Extremity Inspection: Skin color, temperature, and hair growth are WNL. No peripheral edema or cyanosis. No masses, redness, swelling, asymmetry, or associated skin lesions. No contractures.  Functional ROM: Pain restricted ROM for hip and knee joints Limited SLR (straight leg raise)  Functional ROM: Unrestricted ROM                  Muscle Tone/Strength: Functionally intact. No obvious neuro-muscular anomalies detected.  Muscle Tone/Strength: Functionally intact. No obvious neuro-muscular anomalies detected.  Sensory (Neurological): Arthropathic arthralgia        Sensory (Neurological): Arthropathic arthralgia        DTR: Patellar: deferred  today Achilles: deferred today Plantar: deferred today  DTR: Patellar: deferred today Achilles: deferred today Plantar: deferred today  Palpation: No palpable anomalies  Palpation: No palpable anomalies   Assessment  Primary Diagnosis & Pertinent Problem List: The primary encounter diagnosis was Chronic radicular lumbar pain (right). Diagnoses of Lumbar radiculopathy (right), Lumbar facet arthropathy, Lumbar spondylosis, Lumbar degenerative disc disease, Sacroiliac joint pain, SI joint arthritis, and Chronic pain syndrome were also pertinent to this visit.  Visit Diagnosis (New problems to examiner): 1. Chronic radicular lumbar pain (right)   2. Lumbar radiculopathy (right)   3. Lumbar facet arthropathy   4. Lumbar spondylosis   5. Lumbar degenerative disc disease   6. Sacroiliac joint pain   7. SI joint arthritis   8. Chronic pain syndrome    Plan of Care (Initial workup plan)  Note: Erin Contreras was reminded that as per protocol, today's visit has been an evaluation only. We have not taken over the patient's controlled substance management.  1.  Chronic lumbar radicular pain.  Status post physical therapy.  Encourage patient to utilize gabapentin more regularly.  Recommend right diagnostic epidural steroid injection.  Reviewed patient's lumbar MRI in great detail and she has multilevel disc herniation that could be contributing to her radicular pain symptoms.  Risk and benefits of lumbar ESI discussed with patient and patient would like to proceed.  2.  X-rays of sacroiliac joints given endorsement of deep buttock pain reviewed SI joint exercises with patient  3.  Continue gabapentin as prescribed, recommend regular use as directed.  300 mg 3 times a day however if daytime dose makes her sedated, she can do 600 mg nightly and 300 mg during the day.  4.  Prescription for tizanidine below as needed    Lab Orders     Compliance Drug Analysis, Ur  Imaging Orders     DG Si  Joints  Procedure Orders     Lumbar Epidural Injection Pharmacotherapy (current): Medications ordered:  Meds ordered this encounter  Medications  . tiZANidine (ZANAFLEX) 4 MG tablet    Sig: Take 1 tablet (4 mg total) by mouth at bedtime as needed for muscle spasms.    Dispense:  30 tablet    Refill:  1    Do not place this medication, or any other prescription from our practice, on "Automatic Refill". Patient may have prescription filled one day early if pharmacy is closed on scheduled refill date.   Medications administered during this visit: Erin Contreras had no medications administered during this visit.   Pharmacological management options:  Opioid Analgesics: The patient was informed that there is no guarantee that she would be a candidate for opioid analgesics. The decision will be made following CDC guidelines. This decision will be based on the results of diagnostic studies, as well as Erin Contreras's risk profile.   Membrane stabilizer: Gabapentin 300 mg 3 times a day  Muscle relaxant: Tizanidine as needed  NSAID: Instructed to utilize single NSAID therapy 3-4 times a week.  Other analgesic(s): To be determined at a later time   Interventional management options: Erin Contreras was informed that there is no guarantee that she would be a candidate for interventional therapies. The decision will be based on the results of diagnostic studies, as well as Erin Contreras's risk profile.  Procedure(s) under consideration:  Lumbar epidural steroid injection Lumbar facet medial branch nerve block Lumbar radiofrequency ablation Sacroiliac joint injection   Provider-requested follow-up: Return in about 2 weeks (around 05/20/2020) for L-ESI , without sedation.  Future Appointments  Date Time Provider [place]  05/15/2020  8:00 AM [facility]  05/28/2020  9:00 AM Gillis Santa, MD [facility] None    Note by: Gillis Santa, MD Date: 05/06/2020; Time: 11:39 AM

## 2020-05-06 NOTE — Telephone Encounter (Signed)
Requested medication (s) are due for refill today: yes  Requested medication (s) are on the active medication list: yes  Last refill:  04/16/20  Future visit scheduled: no  Notes to clinic:  historical provider; not delegated    Requested Prescriptions  Pending Prescriptions Disp Refills   Cholecalciferol (VITAMIN D3) 1.25 MG (50000 UT) CAPS [Pharmacy Med Name: VITAMIN D3 50,000 UNIT CAPSULE] 4 capsule 1    Sig: TAKE 1 TABLET BY MOUTH ONCE A WEEK. FOR 8 WEEKS AND THEN STOP. RETURN TO OFFICE FOR LAB DRAW.      Endocrinology:  Vitamins - Vitamin D Supplementation Failed - 05/06/2020 10:51 AM      Failed - 50,000 IU strengths are not delegated      Failed - Phosphate in normal range and within 360 days    Phosphorus  Date Value Ref Range Status  09/04/2014 3.7 2.5 - 4.9 mg/dL Final          Failed - Vitamin D in normal range and within 360 days    Vit D, 25-Hydroxy  Date Value Ref Range Status  03/11/2020 15.1 (L) 30.0 - 100.0 ng/mL Final    Comment:    Vitamin D deficiency has been defined by the Institute of Medicine and an Endocrine Society practice guideline as a level of serum 25-OH vitamin D less than 20 ng/mL (1,2). The Endocrine Society went on to further define vitamin D insufficiency as a level between 21 and 29 ng/mL (2). 1. IOM (Institute of Medicine). 2010. Dietary reference    intakes for calcium and D. [place]: The    [employer]. 2. Holick MF, Binkley [place], Bischoff-Ferrari HA, et al.    Evaluation, treatment, and prevention of vitamin D    deficiency: an Endocrine Society clinical practice    guideline. JCEM. 2011 Jul; 96(7):1911-30.           Passed - Ca in normal range and within 360 days    Calcium  Date Value Ref Range Status  03/11/2020 9.2 8.7 - 10.3 mg/dL Final   Calcium, Total  Date Value Ref Range Status  09/04/2014 8.8 8.5 - 10.1 mg/dL Final          Passed - Valid encounter within last 12 months    Recent Outpatient  Visits           2 weeks ago Flu vaccine need   [facility] Kathrine Haddock, NP   1 month ago Rheumatoid arthritis involving multiple sites with positive rheumatoid factor ([place])   [place], Henrine Screws T, NP   2 months ago Vaginal itching   [place], NP   4 months ago Vaginal discharge   [facility] Volney American, [place]   7 months ago Chronic left-sided low back pain with left-sided sciatica   [facility] Venita Lick, NP

## 2020-05-06 NOTE — Patient Instructions (Signed)
Preparing for your procedure (without sedation) Instructions: Oral Intake: Do not eat or drink anything for at least 3 hours prior to your procedure. Transportation: Unless otherwise stated by your physician, you may drive yourself after the procedure. Blood Pressure Medicine: Take your blood pressure medicine with a sip of water the morning of the procedure. Insulin: Take only  of your normal insulin dose. Preventing infections: Shower with an antibacterial soap the morning of your procedure. Build-up your immune system: Take 1000 mg of Vitamin C with every meal (3 times a day) the day prior to your procedure. Pregnancy: If you are pregnant, call and cancel the procedure. Sickness: If you have a cold, fever, or any active infections, call and cancel the procedure. Arrival: You must be in the facility at least 30 minutes prior to your scheduled procedure. Children: Do not bring any children with you. Dress appropriately: Bring dark clothing that you would not mind if they get stained. Valuables: Do not bring any jewelry or valuables. Procedure appointments are reserved for interventional treatments only. No Prescription Refills. No medication changes will be discussed during procedure appointments. No disability issues will be discussed. ____________________________________________________________________________________________  Epidural Steroid Injection  An epidural steroid injection is given to relieve pain in your neck, back, or legs that is caused by the irritation or swelling of a nerve root. This procedure involves injecting a steroid and numbing medicine (anesthetic) into the epidural space. The epidural space is the space between the outer covering of your spinal cord and the bones that form your backbone (vertebra).  LET YOUR HEALTH CARE PROVIDER KNOW ABOUT:  Any allergies you have. All medicines you are taking, including vitamins, herbs, eye drops, creams, and over-the-counter  medicines such as aspirin. Previous problems you or members of your family have had with the use of anesthetics. Any blood disorders or blood clotting disorders you have. Previous surgeries you have had. Medical conditions you have.  RISKS AND COMPLICATIONS Generally, this is a safe procedure. However, as with any procedure, complications can occur. Possible complications of epidural steroid injection include: Headache. Bleeding. Infection. Allergic reaction to the medicines. Damage to your nerves. The response to this procedure depends on the underlying cause of the pain and its duration. People who have long-term (chronic) pain are less likely to benefit from epidural steroids than are those people whose pain comes on strong and suddenly.  BEFORE THE PROCEDURE  Ask your health care provider about changing or stopping your regular medicines. You may be advised to stop taking blood-thinning medicines a few days before the procedure. You may be given medicines to reduce anxiety. Arrange for someone to take you home after the procedure.  PROCEDURE  You will remain awake during the procedure. You may receive medicine to make you relaxed. You will be asked to lie on your stomach. The injection site will be cleaned. The injection site will be numbed with a medicine (local anesthetic). A needle will be injected through your skin into the epidural space. Your health care provider will use an X-ray machine to ensure that the steroid is delivered closest to the affected nerve. You may have minimal discomfort at this time. Once the needle is in the right position, the local anesthetic and the steroid will be injected into the epidural space. The needle will then be removed and a bandage will be applied to the injection site.  AFTER THE PROCEDURE  You may be monitored for a short time before you go home. You may   feel weakness or numbness in your arm or leg, which disappears within hours. You  may be allowed to eat, drink, and take your regular medicine. You may have soreness at the site of the injection.   This information is not intended to replace advice given to you by your health care provider. Make sure you discuss any questions you have with your health care provider.   Document Released: 10/26/2007 Document Revised: 03/21/2013 Document Reviewed: 01/05/2013 Elsevier Interactive Patient Education 2016 Else[employer] Inc.  GENERAL RISKS AND COMPLICATIONS  What are the risk, side effects and possible complications? Generally speaking, most procedures are safe.  However, with any procedure there are risks, side effects, and the possibility of complications.  The risks and complications are dependent upon the sites that are lesioned, or the type of nerve block to be performed.  The closer the procedure is to the spine, the more serious the risks are.  Great care is taken when placing the radio frequency needles, block needles or lesioning probes, but sometimes complications can occur. Infection: Any time there is an injection through the skin, there is a risk of infection.  This is why sterile conditions are used for these blocks. There are four possible types of infection: 1. Localized skin infection. 2. Central Nervous System Infection: This can be in the form of Meningitis, which can be deadly. 3. Epidural Infections: This can be in the form of an epidural abscess, which can cause pressure inside of the spine, causing compression of the spinal cord with subsequent paralysis. This would require an emergency surgery to decompress, and there are no guarantees that the patient would recover from the paralysis. 4. Discitis: This is an infection of the intervertebral discs. It occurs in about 1% of discography procedures. It is difficult to treat and it may lead to surgery. Pain: the needles have to go through skin and soft tissues, will cause soreness. Damage to internal structures:  The  nerves to be lesioned may be near blood vessels or other nerves which can be potentially damaged. Bleeding: Bleeding is more common if the patient is taking blood thinners such as  aspirin, Coumadin, Ticiid, Plavix, etc., or if he/she have some genetic predisposition such as hemophilia. Bleeding into the spinal canal can cause compression of the spinal  cord with subsequent paralysis.  This would require an emergency surgery to decompress and there are no guarantees that the patient would recover from the paralysis. Pneumothorax: Puncturing of a lung is a possibility, every time a needle is introduced in the area of the chest or upper back.  Pneumothorax refers to free air around the collapsed lung(s), inside of the thoracic cavity (chest cavity).  Another two possible complications related to a similar event would include: Hemothorax and Chylothorax. These are variations of the Pneumothorax, where instead of air around the collapsed lung(s), you may have blood or chyle, respectively. Spinal headaches: They may occur with any procedures in the area of the spine. Persistent CSF (Cerebro-Spinal Fluid) leakage: This is a rare problem, but may occur with prolonged intrathecal or epidural catheters either due to the formation of a fistulous track or a dural tear. Nerve damage: By working so close to the spinal cord, there is always a possibility of nerve damage, which could be as serious as a permanent spinal cord injury with paralysis. Death: Although rare, severe deadly allergic reactions known as "Anaphylactic reaction" can occur to any of the medications used. Worsening of the symptoms: We can always make   thing worse.  What are the chances of something like this happening? Chances of any of this occuring are extremely low.  By statistics, you have more of a chance of getting killed in a motor vehicle accident: while driving to the hospital than any of the above occurring .  Nevertheless, you should be aware  that they are possibilities.  In general, it is similar to taking a shower.  Everybody knows that you can slip, hit your head and get killed.  Does that mean that you should not shower again?  Nevertheless always keep in mind that statistics do not mean anything if you happen to be on the wrong side of them.  Even if a procedure has a 1 (one) in a 1,000,000 (million) chance of going wrong, it you happen to be that one..Also, keep in mind that by statistics, you have more of a chance of having something go wrong when taking medications.  Who should not have this procedure? If you are on a blood thinning medication (e.g. Coumadin, Plavix, see list of "Blood Thinners"), or if you have an active infection going on, you should not have the procedure.  If you are taking any blood thinners, please inform your physician.  Preparing for your procedure: Do not eat or drink anything at least eight (8) hours prior to the procedure. Bring a driver with you .  It cannot be a taxi. Come accompanied by an adult that can drive you back, and that is strong enough to help you if your legs get weak or numb from the local anesthetic. Take all of your medicines the morning of the procedure with just enough water to swallow them. If you have diabetes, make sure that you are scheduled to have your procedure done first thing in the morning, whenever possible. If you have diabetes, take only half of your insulin dose and notify our nurse that you have done so as soon as you arrive at the clinic. If you are diabetic, but only take blood sugar pills (oral hypoglycemic), then do not take them on the morning of your procedure.  You may take them after you have had the procedure. Do not take aspirin or any aspirin-containing medications, at least eleven (11) days prior to the procedure.  They may prolong bleeding. Wear loose fitting clothing that may be easy to take off and that you would not mind if it got stained with Betadine or  blood. Do not wear any jewelry or perfume Remove any nail coloring.  It will interfere with some of our monitoring equipment. If you take Metformin for your diabetes, stop it 48 hours prior to the procedure.  NOTE: Remember that this is not meant to be interpreted as a complete list of all possible complications.  Unforeseen problems may occur.  BLOOD THINNERS The following drugs contain aspirin or other products, which can cause increased bleeding during surgery and should not be taken for 2 weeks prior to and 1 week after surgery.  If you should need take something for relief of minor pain, you may take acetaminophen which is found in Tylenol,m Datril, Anacin-3 and Panadol. It is not blood thinner. The products listed below are.  Do not take any of the products listed below in addition to any listed on your instruction sheet.  A.P.C or A.P.C with Codeine Codeine Phosphate Capsules #3 Ibuprofen Ridaura  ABC compound Congesprin Imuran rimadil  Advil Cope Indocin Robaxisal  Alka-Seltzer Effervescent Pain Reliever and Antacid Coricidin or   Coricidin-D  Indomethacin Rufen  Alka-Seltzer plus Cold Medicine Cosprin Ketoprofen S-A-C Tablets  Anacin Analgesic Tablets or Capsules Coumadin Korlgesic Salflex  Anacin Extra Strength Analgesic tablets or capsules CP-2 Tablets Lanoril Salicylate  Anaprox Cuprimine Capsules Levenox Salocol  Anexsia-D Dalteparin Magan Salsalate  Anodynos Darvon compound Magnesium Salicylate Sine-off  Ansaid Dasin Capsules Magsal Sodium Salicylate  Anturane Depen Capsules Marnal Soma  APF Arthritis pain formula Dewitt's Pills Measurin Stanback  Argesic Dia-Gesic Meclofenamic Sulfinpyrazone  Arthritis Bayer Timed Release Aspirin Diclofenac Meclomen Sulindac  Arthritis pain formula Anacin Dicumarol Medipren Supac  Analgesic (Safety coated) Arthralgen Diffunasal Mefanamic Suprofen  Arthritis Strength Bufferin Dihydrocodeine Mepro Compound Suprol  Arthropan liquid Dopirydamole  Methcarbomol with Aspirin Synalgos  ASA tablets/Enseals Disalcid Micrainin Tagament  Ascriptin Doan's Midol Talwin  Ascriptin A/D Dolene Mobidin Tanderil  Ascriptin Extra Strength Dolobid Moblgesic Ticlid  Ascriptin with Codeine Doloprin or Doloprin with Codeine Momentum Tolectin  Asperbuf Duoprin Mono-gesic Trendar  Aspergum Duradyne Motrin or Motrin IB Triminicin  Aspirin plain, buffered or enteric coated Durasal Myochrisine Trigesic  Aspirin Suppositories Easprin Nalfon Trillsate  Aspirin with Codeine Ecotrin Regular or Extra Strength Naprosyn Uracel  Atromid-S Efficin Naproxen Ursinus  Auranofin Capsules Elmiron Neocylate Vanquish  Axotal Emagrin Norgesic Verin  Azathioprine Empirin or Empirin with Codeine Normiflo Vitamin E  Azolid Emprazil Nuprin Voltaren  Bayer Aspirin plain, buffered or children's or timed BC Tablets or powders Encaprin Orgaran Warfarin Sodium  Buff-a-Comp Enoxaparin Orudis Zorpin  Buff-a-Comp with Codeine Equegesic Os-Cal-Gesic   Buffaprin Excedrin plain, buffered or Extra Strength Oxalid   Bufferin Arthritis Strength Feldene Oxphenbutazone   Bufferin plain or Extra Strength Feldene Capsules Oxycodone with Aspirin   Bufferin with Codeine Fenoprofen Fenoprofen Pabalate or Pabalate-SF   Buffets II Flogesic Panagesic   Buffinol plain or Extra Strength Florinal or Florinal with Codeine Panwarfarin   Buf-Tabs Flurbiprofen Penicillamine   Butalbital Compound Four-way cold tablets Penicillin   Butazolidin Fragmin Pepto-Bismol   Carbenicillin Geminisyn Percodan   Carna Arthritis Reliever Geopen Persantine   Carprofen Gold's salt Persistin   Chloramphenicol Goody's Phenylbutazone   Chloromycetin Haltrain Piroxlcam   Clmetidine heparin Plaquenil   Cllnoril Hyco-pap Ponstel   Clofibrate Hydroxy chloroquine Propoxyphen         Before stopping any of these medications, be sure to consult the physician who ordered them.  Some, such as Coumadin (Warfarin) are ordered  to prevent or treat serious conditions such as "deep thrombosis", "pumonary embolisms", and other heart problems.  The amount of time that you may need off of the medication may also vary with the medication and the reason for which you were taking it.  If you are taking any of these medications, please make sure you notify your pain physician before you undergo any procedures. ____________________________________________________________________________________________  

## 2020-05-06 NOTE — Progress Notes (Signed)
Safety precautions to be maintained throughout the outpatient stay will include: orient to surroundings, keep bed in low position, maintain call bell within reach at all times, provide assistance with transfer out of bed and ambulation.  

## 2020-05-09 LAB — COMPLIANCE DRUG ANALYSIS, UR

## 2020-05-15 ENCOUNTER — Other Ambulatory Visit: Payer: Self-pay

## 2020-05-15 ENCOUNTER — Other Ambulatory Visit: Payer: BC Managed Care – PPO

## 2020-05-15 DIAGNOSIS — E559 Vitamin D deficiency, unspecified: Secondary | ICD-10-CM | POA: Diagnosis not present

## 2020-05-16 ENCOUNTER — Other Ambulatory Visit: Payer: Self-pay | Admitting: Nurse Practitioner

## 2020-05-16 LAB — VITAMIN D 25 HYDROXY (VIT D DEFICIENCY, FRACTURES): Vit D, 25-Hydroxy: 30.9 ng/mL (ref 30.0–100.0)

## 2020-05-16 MED ORDER — VITAMIN D3 1.25 MG (50000 UT) PO CAPS
1.0000 | ORAL_CAPSULE | ORAL | 4 refills | Status: DC
Start: 2020-05-16 — End: 2020-05-28

## 2020-05-16 NOTE — Progress Notes (Signed)
Good morning, please let Erin Contreras know her Vitamin D level is improving with higher dose weekly.  We will continue this for now and if improves next check may start lower daily dose.  I have sent in refills.  Have a great day!! Keep being awesome!!  Thank you for allowing me to participate in your care. Kindest regards, Suellen Durocher

## 2020-05-28 ENCOUNTER — Ambulatory Visit (HOSPITAL_BASED_OUTPATIENT_CLINIC_OR_DEPARTMENT_OTHER): Payer: BC Managed Care – PPO | Admitting: Student in an Organized Health Care Education/Training Program

## 2020-05-28 ENCOUNTER — Ambulatory Visit
Admission: RE | Admit: 2020-05-28 | Discharge: 2020-05-28 | Disposition: A | Discharge status: Home / Self Care | Payer: BC Managed Care – PPO | Source: Ambulatory Visit | Attending: Student in an Organized Health Care Education/Training Program | Admitting: Student in an Organized Health Care Education/Training Program

## 2020-05-28 ENCOUNTER — Encounter: Payer: Self-pay | Admitting: Student in an Organized Health Care Education/Training Program

## 2020-05-28 ENCOUNTER — Other Ambulatory Visit: Payer: Self-pay | Admitting: Student in an Organized Health Care Education/Training Program

## 2020-05-28 ENCOUNTER — Other Ambulatory Visit: Payer: Self-pay

## 2020-05-28 ENCOUNTER — Other Ambulatory Visit: Payer: Self-pay | Admitting: Nurse Practitioner

## 2020-05-28 VITALS — BP 127/82 | HR 68 | Temp 97.0°F | Resp 18 | Ht 69.0 in | Wt 263.0 lb

## 2020-05-28 DIAGNOSIS — M5416 Radiculopathy, lumbar region: Secondary | ICD-10-CM | POA: Insufficient documentation

## 2020-05-28 DIAGNOSIS — G8929 Other chronic pain: Secondary | ICD-10-CM | POA: Insufficient documentation

## 2020-05-28 MED ORDER — VITAMIN D3 1.25 MG (50000 UT) PO CAPS
1.0000 | ORAL_CAPSULE | ORAL | 4 refills | Status: DC
Start: 2020-05-28 — End: 2020-06-12

## 2020-05-28 MED ORDER — SODIUM CHLORIDE (PF) 0.9 % IJ SOLN
INTRAMUSCULAR | Status: AC
  Filled 2020-05-28: qty 10

## 2020-05-28 MED ORDER — ROPIVACAINE HCL 2 MG/ML IJ SOLN
2.0000 mL | Freq: Once | INTRAMUSCULAR | Status: AC
  Administered 2020-05-28: 2 mL via EPIDURAL

## 2020-05-28 MED ORDER — LIDOCAINE HCL 2 % IJ SOLN
20.0000 mL | Freq: Once | INTRAMUSCULAR | Status: AC
  Administered 2020-05-28: 400 mg

## 2020-05-28 MED ORDER — SODIUM CHLORIDE 0.9% FLUSH
2.0000 mL | Freq: Once | INTRAVENOUS | Status: AC
  Administered 2020-05-28: 2 mL

## 2020-05-28 MED ORDER — DEXAMETHASONE SODIUM PHOSPHATE 10 MG/ML IJ SOLN
INTRAMUSCULAR | Status: AC
  Filled 2020-05-28: qty 1

## 2020-05-28 MED ORDER — ROPIVACAINE HCL 2 MG/ML IJ SOLN
INTRAMUSCULAR | Status: AC
  Filled 2020-05-28: qty 10

## 2020-05-28 MED ORDER — DEXAMETHASONE SODIUM PHOSPHATE 10 MG/ML IJ SOLN
10.0000 mg | Freq: Once | INTRAMUSCULAR | Status: AC
  Administered 2020-05-28: 10 mg

## 2020-05-28 MED ORDER — IOHEXOL 180 MG/ML  SOLN
10.0000 mL | Freq: Once | INTRAMUSCULAR | Status: AC
  Administered 2020-05-28: 10 mL via EPIDURAL

## 2020-05-28 MED ORDER — LIDOCAINE HCL 2 % IJ SOLN
INTRAMUSCULAR | Status: AC
  Filled 2020-05-28: qty 20

## 2020-05-28 NOTE — Patient Instructions (Signed)

## 2020-05-28 NOTE — Telephone Encounter (Signed)
PT need a refill  Cholecalciferol (VITAMIN D3) 1.25 MG (50000 UT) CAPS [[id number]] [id number] - [place], [place] - [address]  [address] [place] [address]  Phone: [phone] Fax: [phone]

## 2020-05-28 NOTE — Progress Notes (Signed)
Safety precautions to be maintained throughout the outpatient stay will include: orient to surroundings, keep bed in low position, maintain call bell within reach at all times, provide assistance with transfer out of bed and ambulation.  

## 2020-05-28 NOTE — Progress Notes (Signed)
PROVIDER NOTE: Information contained herein reflects review and annotations entered in association with encounter. Interpretation of such information and data should be left to medically-trained personnel. Information provided to patient can be located elsewhere in the medical record under "Patient Instructions". Document created using STT-dictation technology, any transcriptional errors that may result from process are unintentional.    Patient: Erin Contreras  Service Category: Procedure  Provider: Gillis Santa, MD  DOB: 1960-10-21  DOS: 05/28/2020  Location: [facility]  MRN: [id number]  Setting: Ambulatory - outpatient  Referring Provider: Venita Lick, NP  Type: Established Patient  Specialty: Interventional Pain Management  PCP: Venita Lick, NP   Primary Reason for Visit: Interventional Pain Management Treatment. CC: Back Pain (low)  Procedure:          Anesthesia, Analgesia, Anxiolysis:  Type: Diagnostic Inter-Laminar Epidural Steroid Injection  #1  Region: Lumbar Level: L5-S1 Level. Laterality: Right         Type: Local Anesthesia  Local Anesthetic: Lidocaine 1-2%  Position: Prone with head of the table was raised to facilitate breathing.   Indications: 1. Chronic radicular lumbar pain (right)   2. Lumbar radiculopathy (right)    Pain Score: Pre-procedure: 7 /10 Post-procedure: 0-No pain/10   Pre-op Assessment:  Ms. Mander is a 59 y.o. (year old), female patient, seen today for interventional treatment. She  has a past surgical history that includes Cholecystectomy (2011); Tumor removal (Right, 2010); Sleeve Gastroplasty; Fracture surgery (Right); and Knee Arthroplasty (Right, 07/11/2017). Ms. Streng has a current medication list which includes the following prescription(s): acetaminophen, acyclovir, albuterol, vitamin d3, etanercept, folic acid, gabapentin, methotrexate, tizanidine, ibuprofen, and trazodone. Her primarily concern today is the Back  Pain (low)  Initial Vital Signs:  Pulse/HCG Rate: 68ECG Heart Rate: 69 Temp: (!) 97 F (36.1 C) Resp: 18 BP: 128/87 SpO2: 99 %  BMI: Estimated body mass index is 38.84 kg/m as calculated from the following:   Height as of this encounter: 5\' 9"  (1.753 m).   Weight as of this encounter: 263 lb (119.3 kg).  Risk Assessment: Allergies: Reviewed. She has No Known Allergies.  Allergy Precautions: None required Coagulopathies: Reviewed. None identified.  Blood-thinner therapy: None at this time Active Infection(s): Reviewed. None identified. Ms. Horacek is afebrile  Site Confirmation: Ms. Porta was asked to confirm the procedure and laterality before marking the site Procedure checklist: Completed Consent: Before the procedure and under the influence of no sedative(s), amnesic(s), or anxiolytics, the patient was informed of the treatment options, risks and possible complications. To fulfill our ethical and legal obligations, as recommended by the American Medical Association's Code of Ethics, I have informed the patient of my clinical impression; the nature and purpose of the treatment or procedure; the risks, benefits, and possible complications of the intervention; the alternatives, including doing nothing; the risk(s) and benefit(s) of the alternative treatment(s) or procedure(s); and the risk(s) and benefit(s) of doing nothing. The patient was provided information about the general risks and possible complications associated with the procedure. These may include, but are not limited to: failure to achieve desired goals, infection, bleeding, organ or nerve damage, allergic reactions, paralysis, and death. In addition, the patient was informed of those risks and complications associated to Spine-related procedures, such as failure to decrease pain; infection (i.e.: Meningitis, epidural or intraspinal abscess); bleeding (i.e.: epidural hematoma, subarachnoid hemorrhage, or any other type of  intraspinal or peri-dural bleeding); organ or nerve damage (i.e.: Any type of peripheral nerve, nerve root, or  spinal cord injury) with subsequent damage to sensory, motor, and/or autonomic systems, resulting in permanent pain, numbness, and/or weakness of one or several areas of the body; allergic reactions; (i.e.: anaphylactic reaction); and/or death. Furthermore, the patient was informed of those risks and complications associated with the medications. These include, but are not limited to: allergic reactions (i.e.: anaphylactic or anaphylactoid reaction(s)); adrenal axis suppression; blood sugar elevation that in diabetics may result in ketoacidosis or comma; water retention that in patients with history of congestive heart failure may result in shortness of breath, pulmonary edema, and decompensation with resultant heart failure; weight gain; swelling or edema; medication-induced neural toxicity; particulate matter embolism and blood vessel occlusion with resultant organ, and/or nervous system infarction; and/or aseptic necrosis of one or more joints. Finally, the patient was informed that Medicine is not an exact science; therefore, there is also the possibility of unforeseen or unpredictable risks and/or possible complications that may result in a catastrophic outcome. The patient indicated having understood very clearly. We have given the patient no guarantees and we have made no promises. Enough time was given to the patient to ask questions, all of which were answered to the patient's satisfaction. Ms. Mcclarty has indicated that she wanted to continue with the procedure. Attestation: I, the ordering provider, attest that I have discussed with the patient the benefits, risks, side-effects, alternatives, likelihood of achieving goals, and potential problems during recovery for the procedure that I have provided informed consent. Date  Time: 05/28/2020  1:26 PM  Pre-Procedure Preparation:  Monitoring:  As per clinic protocol. Respiration, ETCO2, SpO2, BP, heart rate and rhythm monitor placed and checked for adequate function Safety Precautions: Patient was assessed for positional comfort and pressure points before starting the procedure. Time-out: I initiated and conducted the "Time-out" before starting the procedure, as per protocol. The patient was asked to participate by confirming the accuracy of the "Time Out" information. Verification of the correct person, site, and procedure were performed and confirmed by me, the nursing staff, and the patient. "Time-out" conducted as per Joint Commission's Universal Protocol (UP.01.01.01). Time: 1353  Description of Procedure:          Target Area: The interlaminar space, initially targeting the lower laminar border of the superior vertebral body. Approach: Paramedial approach. Area Prepped: Entire Posterior Lumbar Region DuraPrep (Iodine Povacrylex [0.7% available iodine] and Isopropyl Alcohol, 74% w/w) Safety Precautions: Aspiration looking for blood return was conducted prior to all injections. At no point did we inject any substances, as a needle was being advanced. No attempts were made at seeking any paresthesias. Safe injection practices and needle disposal techniques used. Medications properly checked for expiration dates. SDV (single dose vial) medications used. Description of the Procedure: Protocol guidelines were followed. The procedure needle was introduced through the skin, ipsilateral to the reported pain, and advanced to the target area. Bone was contacted and the needle walked caudad, until the lamina was cleared. The epidural space was identified using "loss-of-resistance technique" with 2-3 ml of PF-NaCl (0.9% NSS), in a 5cc LOR glass syringe.  Vitals:   05/28/20 1349 05/28/20 1355 05/28/20 1400 05/28/20 1405  BP: 121/78 127/79 125/78 127/82  Pulse:      Resp: (!) 21 (!) 21 (!) 23 18  Temp:      TempSrc:      SpO2: 100% 100% 100%  100%  Weight:      Height:        Start Time: 1353 hrs. End Time: 1403 hrs.  Materials:  Needle(s) Type: Epidural needle Gauge: 17G Length: 5-in Medication(s): Please see orders for medications and dosing details. 6 cc solution made of 3 cc of preservative-free saline, 2 cc of 0.2% ropivacaine, 1 cc of Decadron 10 mg/cc.  Imaging Guidance (Spinal):          Type of Imaging Technique: Fluoroscopy Guidance (Spinal) Indication(s): Assistance in needle guidance and placement for procedures requiring needle placement in or near specific anatomical locations not easily accessible without such assistance. Exposure Time: Please see nurses notes. Contrast: Before injecting any contrast, we confirmed that the patient did not have an allergy to iodine, shellfish, or radiological contrast. Once satisfactory needle placement was completed at the desired level, radiological contrast was injected. Contrast injected under live fluoroscopy. No contrast complications. See chart for type and volume of contrast used. Fluoroscopic Guidance: I was personally present during the use of fluoroscopy. "Tunnel Vision Technique" used to obtain the best possible view of the target area. Parallax error corrected before commencing the procedure. "Direction-depth-direction" technique used to introduce the needle under continuous pulsed fluoroscopy. Once target was reached, antero-posterior, oblique, and lateral fluoroscopic projection used confirm needle placement in all planes. Images permanently stored in EMR. Interpretation: I personally interpreted the imaging intraoperatively. Adequate needle placement confirmed in multiple planes. Appropriate spread of contrast into desired area was observed. No evidence of afferent or efferent intravascular uptake. No intrathecal or subarachnoid spread observed. Permanent images saved into the patient's record.  Antibiotic Prophylaxis:   Anti-infectives (From admission, onward)    None     Indication(s): None identified  Post-operative Assessment:  Post-procedure Vital Signs:  Pulse/HCG Rate: 6860 Temp: (!) 97 F (36.1 C) Resp: 18 BP: 127/82 SpO2: 100 %  EBL: None  Complications: No immediate post-treatment complications observed by team, or reported by patient.  Note: The patient tolerated the entire procedure well. A repeat set of vitals were taken after the procedure and the patient was kept under observation following institutional policy, for this type of procedure. Post-procedural neurological assessment was performed, showing return to baseline, prior to discharge. The patient was provided with post-procedure discharge instructions, including a section on how to identify potential problems. Should any problems arise concerning this procedure, the patient was given instructions to immediately contact us, at any time, without hesitation. In any case, we plan to contact the patient by telephone for a follow-up status report regarding this interventional procedure.  Comments:  No additional relevant information.  5 out of 5 strength bilateral lower extremity: Plantar flexion, dorsiflexion, knee flexion, knee extension.  Plan of Care  Orders:  Orders Placed This Encounter  Procedures  . DG PAIN CLINIC C-ARM 1-60 MIN NO REPORT    Intraoperative interpretation by procedural physician at [place].    Standing Status:   Standing    Number of Occurrences:   1    Order Specific Question:   Reason for exam:    Answer:   Assistance in needle guidance and placement for procedures requiring needle placement in or near specific anatomical locations not easily accessible without such assistance.    Medications ordered for procedure: Meds ordered this encounter  Medications  . iohexol (OMNIPAQUE) 180 MG/ML injection 10 mL    Must be Myelogram-compatible. If not available, you may substitute with a water-soluble, non-ionic, hypoallergenic,  myelogram-compatible radiological contrast medium.  Marland Kitchen lidocaine (XYLOCAINE) 2 % (with pres) injection 400 mg  . ropivacaine (PF) 2 mg/mL (0.2%) (NAROPIN) injection 2 mL  . sodium chloride  flush (NS) 0.9 % injection 2 mL  . dexamethasone (DECADRON) injection 10 mg   Medications administered: We administered iohexol, lidocaine, ropivacaine (PF) 2 mg/mL (0.2%), sodium chloride flush, and dexamethasone.  See the medical record for exact dosing, route, and time of administration.  Follow-up plan:   Return in about 4 weeks (around 06/25/2020) for Post Procedure Evaluation, virtual.      Right L5-S1 ESI 05/28/2020   Recent Visits Date Type Provider Dept  05/06/20 Office Visit Gillis Santa, MD [facility]  Showing recent visits within past 90 days and meeting all other requirements Today's Visits Date Type Provider Dept  05/28/20 Procedure visit Gillis Santa, MD [facility]  Showing today's visits and meeting all other requirements Future Appointments Date Type Provider Dept  06/17/20 Appointment Gillis Santa, MD [facility]  Showing future appointments within next 90 days and meeting all other requirements  Disposition: Discharge home  Discharge (Date  Time): 05/28/2020; 1409 hrs.   Primary Care Physician: Venita Lick, NP Location: [facility] Note by: Gillis Santa, MD Date: 05/28/2020; Time: 2:40 PM  Disclaimer:  Medicine is not an exact science. The only guarantee in medicine is that nothing is guaranteed. It is important to note that the decision to proceed with this intervention was based on the information collected from the patient. The Data and conclusions were drawn from the patient's questionnaire, the interview, and the physical examination. Because the information was provided in large part by the patient, it cannot be guaranteed that it has not been purposely or unconsciously manipulated. Every effort has  been made to obtain as much relevant data as possible for this evaluation. It is important to note that the conclusions that lead to this procedure are derived in large part from the available data. Always take into account that the treatment will also be dependent on availability of resources and existing treatment guidelines, considered by other Pain Management Practitioners as being common knowledge and practice, at the time of the intervention. For Medico-Legal purposes, it is also important to point out that variation in procedural techniques and pharmacological choices are the acceptable norm. The indications, contraindications, technique, and results of the above procedure should only be interpreted and judged by a Board-Certified Interventional Pain Specialist with extensive familiarity and expertise in the same exact procedure and technique.

## 2020-05-28 NOTE — Telephone Encounter (Signed)
Requested medication (s) are due for refill today - no  Requested medication (s) are on the active medication list =yes  Future visit scheduled -no  Last refill: 1 week ago  Notes to clinic: Non delegated Rx- may need to be resent to pharmacy- E-Prescribing Status: Verification status not available for this order  Requested Prescriptions  Pending Prescriptions Disp Refills   Cholecalciferol (VITAMIN D3) 1.25 MG (50000 UT) CAPS 12 capsule 4    Sig: Take 1 capsule by mouth once a week.      Endocrinology:  Vitamins - Vitamin D Supplementation Failed - 05/28/2020  4:03 PM      Failed - 50,000 IU strengths are not delegated      Failed - Phosphate in normal range and within 360 days    Phosphorus  Date Value Ref Range Status  09/04/2014 3.7 2.5 - 4.9 mg/dL Final          Passed - Ca in normal range and within 360 days    Calcium  Date Value Ref Range Status  03/11/2020 9.2 8.7 - 10.3 mg/dL Final   Calcium, Total  Date Value Ref Range Status  09/04/2014 8.8 8.5 - 10.1 mg/dL Final          Passed - Vitamin D in normal range and within 360 days    Vit D, 25-Hydroxy  Date Value Ref Range Status  05/15/2020 30.9 30.0 - 100.0 ng/mL Final    Comment:    Vitamin D deficiency has been defined by the Institute of Medicine and an Endocrine Society practice guideline as a level of serum 25-OH vitamin D less than 20 ng/mL (1,2). The Endocrine Society went on to further define vitamin D insufficiency as a level between 21 and 29 ng/mL (2). 1. IOM (Institute of Medicine). 2010. Dietary reference    intakes for calcium and D. [place]: The    [employer]. 2. Holick MF, Binkley Simpson, Bischoff-Ferrari HA, et al.    Evaluation, treatment, and prevention of vitamin D    deficiency: an Endocrine Society clinical practice    guideline. JCEM. 2011 Jul; 96(7):1911-30.           Passed - Valid encounter within last 12 months    Recent Outpatient Visits           1  month ago Flu vaccine need   [facility] Kathrine Haddock, NP   2 months ago Rheumatoid arthritis involving multiple sites with positive rheumatoid factor ([place])   [place], Henrine Screws T, NP   3 months ago Vaginal itching   [place], Jessica A, NP   5 months ago Vaginal discharge   [place], [place], [place]   8 months ago Chronic left-sided low back pain with left-sided sciatica   [facility] Venita Lick, NP       Future Appointments             In 2 weeks Gillis Santa, MD [place]                Requested Prescriptions  Pending Prescriptions Disp Refills   Cholecalciferol (VITAMIN D3) 1.25 MG (50000 UT) CAPS 12 capsule 4    Sig: Take 1 capsule by mouth once a week.      Endocrinology:  Vitamins - Vitamin D Supplementation Failed - 05/28/2020  4:03 PM      Failed - 50,000 IU strengths  are not delegated      Failed - Phosphate in normal range and within 360 days    Phosphorus  Date Value Ref Range Status  09/04/2014 3.7 2.5 - 4.9 mg/dL Final          Passed - Ca in normal range and within 360 days    Calcium  Date Value Ref Range Status  03/11/2020 9.2 8.7 - 10.3 mg/dL Final   Calcium, Total  Date Value Ref Range Status  09/04/2014 8.8 8.5 - 10.1 mg/dL Final          Passed - Vitamin D in normal range and within 360 days    Vit D, 25-Hydroxy  Date Value Ref Range Status  05/15/2020 30.9 30.0 - 100.0 ng/mL Final    Comment:    Vitamin D deficiency has been defined by the [place] practice guideline as a level of serum 25-OH vitamin D less than 20 ng/mL (1,2). The Endocrine Society went on to further define vitamin D insufficiency as a level between 21 and 29 ng/mL (2). 1. IOM (Institute of Medicine). 2010. Dietary reference    intakes for calcium and D. [place]:  The    [employer]. 2. Holick MF, Binkley Coin, Bischoff-Ferrari HA, et al.    Evaluation, treatment, and prevention of vitamin D    deficiency: an Endocrine Society clinical practice    guideline. JCEM. 2011 Jul; 96(7):1911-30.           Passed - Valid encounter within last 12 months    Recent Outpatient Visits           1 month ago Flu vaccine need   [facility] Kathrine Haddock, NP   2 months ago Rheumatoid arthritis involving multiple sites with positive rheumatoid factor ([place])   [place], Henrine Screws T, NP   3 months ago Vaginal itching   [place], NP   5 months ago Vaginal discharge   [place], [place], [place]   8 months ago Chronic left-sided low back pain with left-sided sciatica   [facility] Venita Lick, NP       Future Appointments             In 2 weeks Gillis Santa, MD [place]

## 2020-05-29 ENCOUNTER — Telehealth: Payer: Self-pay

## 2020-05-29 NOTE — Telephone Encounter (Signed)
Post procedure phone call.  Patient states she is doing well.  

## 2020-06-08 ENCOUNTER — Other Ambulatory Visit: Payer: Self-pay | Admitting: Nurse Practitioner

## 2020-06-08 NOTE — Telephone Encounter (Signed)
Requested medication (s) are due for refill today: yes  Requested medication (s) are on the active medication list: no  Last refill:  06/02/20  Future visit scheduled: no  Notes to clinic:  med not delegated to NT to RF   Requested Prescriptions  Pending Prescriptions Disp Refills   traMADol (ULTRAM) 50 MG tablet [Pharmacy Med Name: TRAMADOL HCL 50 MG TABLET] 10 tablet 0    Sig: Take 1 tablet (50 mg total) by mouth 2 (two) times daily as needed for up to 5 days.      Not Delegated - Analgesics:  Opioid Agonists Failed - 06/08/2020  4:57 PM      Failed - This refill cannot be delegated      Failed - Urine Drug Screen completed in last 360 days      Passed - Valid encounter within last 6 months    Recent Outpatient Visits           1 month ago Flu vaccine need   [facility] Kathrine Haddock, NP   2 months ago Rheumatoid arthritis involving multiple sites with positive rheumatoid factor ([place])   [place], Henrine Screws T, NP   4 months ago Vaginal itching   [place], NP   5 months ago Vaginal discharge   [facility] [place], [place]   8 months ago Chronic left-sided low back pain with left-sided sciatica   [facility] Venita Lick, NP       Future Appointments             In 1 week Gillis Santa, MD [place]

## 2020-06-12 ENCOUNTER — Encounter: Payer: Self-pay | Admitting: Nurse Practitioner

## 2020-06-12 ENCOUNTER — Other Ambulatory Visit: Payer: Self-pay

## 2020-06-12 ENCOUNTER — Telehealth: Payer: Self-pay

## 2020-06-12 ENCOUNTER — Ambulatory Visit (INDEPENDENT_AMBULATORY_CARE_PROVIDER_SITE_OTHER): Payer: BC Managed Care – PPO | Admitting: Nurse Practitioner

## 2020-06-12 VITALS — BP 125/79 | HR 61 | Temp 97.9°F | Ht 67.75 in | Wt 267.4 lb

## 2020-06-12 DIAGNOSIS — B354 Tinea corporis: Secondary | ICD-10-CM | POA: Diagnosis not present

## 2020-06-12 DIAGNOSIS — N898 Other specified noninflammatory disorders of vagina: Secondary | ICD-10-CM | POA: Diagnosis not present

## 2020-06-12 DIAGNOSIS — Z0000 Encounter for general adult medical examination without abnormal findings: Secondary | ICD-10-CM | POA: Diagnosis not present

## 2020-06-12 DIAGNOSIS — Z6841 Body Mass Index (BMI) 40.0 and over, adult: Secondary | ICD-10-CM

## 2020-06-12 DIAGNOSIS — Z13220 Encounter for screening for lipoid disorders: Secondary | ICD-10-CM

## 2020-06-12 DIAGNOSIS — Z1231 Encounter for screening mammogram for malignant neoplasm of breast: Secondary | ICD-10-CM

## 2020-06-12 DIAGNOSIS — Z1329 Encounter for screening for other suspected endocrine disorder: Secondary | ICD-10-CM

## 2020-06-12 LAB — WET PREP FOR TRICH, YEAST, CLUE
Clue Cell Exam: NEGATIVE
Trichomonas Exam: NEGATIVE
Yeast Exam: NEGATIVE

## 2020-06-12 MED ORDER — VITAMIN D3 1.25 MG (50000 UT) PO CAPS
1.0000 | ORAL_CAPSULE | ORAL | 4 refills | Status: DC
Start: 2020-06-12 — End: 2021-04-16

## 2020-06-12 MED ORDER — FLUCONAZOLE 150 MG PO TABS: 150.0000 mg | ORAL_TABLET | Freq: Once | ORAL | 3 refills | Status: AC

## 2020-06-12 MED ORDER — NYSTATIN 100000 UNIT/GM EX CREA: 1.0000 "application " | TOPICAL_CREAM | Freq: Two times a day (BID) | CUTANEOUS | 1 refills | Status: DC

## 2020-06-12 NOTE — Patient Instructions (Signed)
Vaginitis Vaginitis is a condition in which the vaginal tissue swells and becomes red (inflamed). This condition is most often caused by a change in the normal balance of bacteria and yeast that live in the vagina. This change causes an overgrowth of certain bacteria or yeast, which causes the inflammation. There are different types of vaginitis, but the most common types are:  Bacterial vaginosis.  Yeast infection (candidiasis).  Trichomoniasis vaginitis. This is a sexually transmitted disease (STD).  Viral vaginitis.  Atrophic vaginitis.  Allergic vaginitis. What are the causes? The cause of this condition depends on the type of vaginitis. It can be caused by:  Bacteria (bacterial vaginosis).  Yeast, which is a fungus (yeast infection).  A parasite (trichomoniasis vaginitis).  A virus (viral vaginitis).  Low hormone levels (atrophic vaginitis). Low hormone levels can occur during pregnancy, breastfeeding, or after menopause.  Irritants, such as bubble baths, scented tampons, and feminine sprays (allergic vaginitis). Other factors can change the normal balance of the yeast and bacteria that live in the vagina. These include:  Antibiotic medicines.  Poor hygiene.  Diaphragms, vaginal sponges, spermicides, birth control pills, and intrauterine devices (IUD).  Sex.  Infection.  Uncontrolled diabetes.  A weakened defense (immune) system. What increases the risk? This condition is more likely to develop in women who:  Smoke.  Use vaginal douches, scented tampons, or scented sanitary pads.  Wear tight-fitting pants.  Wear thong underwear.  Use oral birth control pills or an IUD.  Have sex without a condom.  Have multiple sex partners.  Have an STD.  Frequently use the spermicide nonoxynol-9.  Eat lots of foods high in sugar.  Have uncontrolled diabetes.  Have low estrogen levels.  Have a weakened immune system from an immune disorder or medical  treatment.  Are pregnant or breastfeeding. What are the signs or symptoms? Symptoms vary depending on the cause of the vaginitis. Common symptoms include:  Abnormal vaginal discharge. ? The discharge is white, gray, or yellow with bacterial vaginosis. ? The discharge is thick, white, and cheesy with a yeast infection. ? The discharge is frothy and yellow or greenish with trichomoniasis.  A bad vaginal smell. The smell is fishy with bacterial vaginosis.  Vaginal itching, pain, or swelling.  Sex that is painful.  Pain or burning when urinating. Sometimes there are no symptoms. How is this diagnosed? This condition is diagnosed based on your symptoms and medical history. A physical exam, including a pelvic exam, will also be done. You may also have other tests, including:  Tests to determine the pH level (acidity or alkalinity) of your vagina.  A whiff test, to assess the odor that results when a sample of your vaginal discharge is mixed with a potassium hydroxide solution.  Tests of vaginal fluid. A sample will be examined under a microscope. How is this treated? Treatment varies depending on the type of vaginitis you have. Your treatment may include:  Antibiotic creams or pills to treat bacterial vaginosis and trichomoniasis.  Antifungal medicines, such as vaginal creams or suppositories, to treat a yeast infection.  Medicine to ease discomfort if you have viral vaginitis. Your sexual partner should also be treated.  Estrogen delivered in a cream, pill, suppository, or vaginal ring to treat atrophic vaginitis. If vaginal dryness occurs, lubricants and moisturizing creams may help. You may need to avoid scented soaps, sprays, or douches.  Stopping use of a product that is causing allergic vaginitis. Then using a vaginal cream to treat the symptoms. Follow   these instructions at home: Lifestyle  Keep your genital area clean and dry. Avoid soap, and only rinse the area with  water.  Do not douche or use tampons until your health care provider says it is okay to do so. Use sanitary pads, if needed.  Do not have sex until your health care provider approves. When you can return to sex, practice safe sex and use condoms.  Wipe from front to back. This avoids the spread of bacteria from the rectum to the vagina. General instructions  Take over-the-counter and prescription medicines only as told by your health care provider.  If you were prescribed an antibiotic medicine, take or use it as told by your health care provider. Do not stop taking or using the antibiotic even if you start to feel better.  Keep all follow-up visits as told by your health care provider. This is important. How is this prevented?  Use mild, non-scented products. Do not use things that can irritate the vagina, such as fabric softeners. Avoid the following products if they are scented: ? Feminine sprays. ? Detergents. ? Tampons. ? Feminine hygiene products. ? Soaps or bubble baths.  Let air reach your genital area. ? Wear cotton underwear to reduce moisture buildup. ? Avoid wearing underwear while you sleep. ? Avoid wearing tight pants and underwear or nylons without a cotton panel. ? Avoid wearing thong underwear.  Take off any wet clothing, such as bathing suits, as soon as possible.  Practice safe sex and use condoms. Contact a health care provider if:  You have abdominal pain.  You have a fever.  You have symptoms that last for more than 2-3 days. Get help right away if:  You have a fever and your symptoms suddenly get worse. Summary  Vaginitis is a condition in which the vaginal tissue becomes inflamed.This condition is most often caused by a change in the normal balance of bacteria and yeast that live in the vagina.  Treatment varies depending on the type of vaginitis you have.  Do not douche, use tampons , or have sex until your health care provider approves. When  you can return to sex, practice safe sex and use condoms. This information is not intended to replace advice given to you by your health care provider. Make sure you discuss any questions you have with your health care provider. Document Revised: 07/01/2017 Document Reviewed: 08/24/2016 Elsevier Patient Education  2020 [employer]  

## 2020-06-12 NOTE — Telephone Encounter (Signed)
Should be on, refills sent in

## 2020-06-12 NOTE — Telephone Encounter (Signed)
Copied from [place] [id number]. Topic: General - Other >> Jun 12, 2020  8:41 AM [name] wrote: Reason for CRM: Patient stated that Rx sent to pharmacy on 05/28/20 for Cholecalciferol (VITAMIN D3) 1.25 MG (50000 UT) CAPS was not received by her so she have not been taking this medication. Please advise

## 2020-06-12 NOTE — Assessment & Plan Note (Signed)
BMI 40.96.  Referral placed to weight management.  Recommended eating smaller high protein, low fat meals more frequently and exercising 30 mins a day 5 times a week with a goal of 10-15lb weight loss in the next 3 months. Patient voiced their understanding and motivation to adhere to these recommendations.

## 2020-06-12 NOTE — Telephone Encounter (Signed)
Routing to provider. Is the patient supposed to be on the prescription strength vitamin D?

## 2020-06-12 NOTE — Progress Notes (Signed)
BP 125/79   Pulse 61   Temp 97.9 F (36.6 C)   Ht 5' 7.75" (1.721 m)   Wt 267 lb 6.4 oz (121.3 kg)   SpO2 97%   BMI 40.96 kg/m    Subjective:    Patient ID: Erin Contreras, female    DOB: 11-Jan-1961, 59 y.o.   MRN: [id number]  HPI: Erin Contreras is a 59 y.o. female presenting on 06/12/2020 for comprehensive medical examination. Current medical complaints include:none  She currently lives with: self Menopausal Symptoms: no   She reports having a vaginal discharge and would like to talk about this today, is aware about insurance rule with physical -- would like to discuss acute issue today.  Reports she also has redness in her naval from yeast.  She also requests referral for weight management provider today.  VAGINAL DISCHARGE Present x 2-3 weeks. Duration: weeks Discharge description: white  Pruritus: no Dysuria: no Malodorous: yes Urinary frequency: no Fevers: no Abdominal pain: no  Sexual activity: not sexually active History of sexually transmitted diseases: no Recent antibiotic use: no Context: stable Treatments attempted: none  Depression Screen done today and results listed below:  Depression screen [facility] 2/9 05/28/2020 05/06/2020 07/05/2019 12/30/2017 07/08/2017  Decreased Interest 0 0 0 0 0  Down, Depressed, Hopeless 0 0 0 0 0  PHQ - 2 Score 0 0 0 0 0  Altered sleeping - - 0 0 -  Tired, decreased energy - - 0 0 -  Change in appetite - - 0 0 -  Feeling bad or failure about yourself  - - 0 0 -  Trouble concentrating - - 0 0 -  Moving slowly or fidgety/restless - - 0 0 -  Suicidal thoughts - - 0 0 -  PHQ-9 Score - - 0 0 -  Difficult doing work/chores - - - Not difficult at all -    The patient does not have a history of falls. I did not complete a risk assessment for falls. A plan of care for falls was not documented.   Past Medical History:  Past Medical History:  Diagnosis Date  . Boils   . Ear mass   . Hemorrhoids   . Herpes simplex virus  infection   . HSV infection   . Lumbago with sciatica   . OA (osteoarthritis) of knee   . Obesity   . Overactive bladder   . PONV (postoperative nausea and vomiting)   . RA (rheumatoid arthritis) ([place])   . Sleep apnea   . Urge and stress incontinence     Surgical History:  Past Surgical History:  Procedure Laterality Date  . CHOLECYSTECTOMY  2011  . FRACTURE SURGERY Right    as a child  . KNEE ARTHROPLASTY Right 07/11/2017   Procedure: COMPUTER ASSISTED TOTAL KNEE ARTHROPLASTY;  Surgeon: Dereck Leep, MD;  Location: [facility] ORS;  Service: Orthopedics;  Laterality: Right;  . SLEEVE GASTROPLASTY    . TUMOR REMOVAL Right 2010   ear    Medications:  Current Outpatient Medications on File Prior to Visit  Medication Sig  . acetaminophen (TYLENOL 8 HOUR ARTHRITIS PAIN) 650 MG CR tablet Take 650 mg by mouth every 8 (eight) hours as needed for pain.  Marland Kitchen acyclovir (ZOVIRAX) 400 MG tablet TAKE 1 TABLET (400 MG TOTAL) BY MOUTH 2 (TWO) TIMES DAILY AS NEEDED.  Marland Kitchen albuterol (VENTOLIN HFA) 108 (90 Base) MCG/ACT inhaler Inhale 2 puffs into the lungs every 6 (six) hours as needed for  wheezing or shortness of breath.  . etanercept (ENBREL) 50 MG/ML injection Inject 50 mg into the skin once a week.  . folic acid (FOLVITE) 1 MG tablet Take 1 mg by mouth daily.  Marland Kitchen gabapentin (NEURONTIN) 300 MG capsule Take 1 capsule (300 mg total) by mouth 3 (three) times daily.  Marland Kitchen ibuprofen (ADVIL) 800 MG tablet Take 800 mg by mouth 3 (three) times daily.   . methotrexate (RHEUMATREX) 2.5 MG tablet TAKE 6 TABLETS (15 MG) BY MOUTH ONCE A WEEK (SAME DAY EACH WEEK)  . tiZANidine (ZANAFLEX) 4 MG tablet Take 1 tablet (4 mg total) by mouth at bedtime as needed for muscle spasms.  . traZODone (DESYREL) 100 MG tablet Take 1 tablet (100 mg total) by mouth at bedtime. (Patient not taking: Reported on 05/28/2020)   No current facility-administered medications on file prior to visit.    Allergies:  No Known  Allergies  Social History:  Social History   Socioeconomic History  . Marital status: Single    Spouse name: Not on file  . Number of children: Not on file  . Years of education: Not on file  . Highest education level: Not on file  Occupational History  . Not on file  Tobacco Use  . Smoking status: Never Smoker  . Smokeless tobacco: Never Used  Vaping Use  . Vaping Use: Never used  Substance and Sexual Activity  . Alcohol use: No  . Drug use: No  . Sexual activity: Not on file  Other Topics Concern  . Not on file  Social History Narrative  . Not on file   Social Determinants of Health   Financial Resource Strain:   . Difficulty of Paying Living Expenses: Not on file  Food Insecurity:   . Worried About Charity fundraiser in the Last Year: Not on file  . Ran Out of Food in the Last Year: Not on file  Transportation Needs:   . Lack of Transportation (Medical): Not on file  . Lack of Transportation (Non-Medical): Not on file  Physical Activity:   . Days of Exercise per Week: Not on file  . Minutes of Exercise per Session: Not on file  Stress:   . Feeling of Stress : Not on file  Social Connections:   . Frequency of Communication with Friends and Family: Not on file  . Frequency of Social Gatherings with Friends and Family: Not on file  . Attends Religious Services: Not on file  . Active Member of Clubs or Organizations: Not on file  . Attends Archivist Meetings: Not on file  . Marital Status: Not on file  Intimate Partner Violence:   . Fear of Current or Ex-Partner: Not on file  . Emotionally Abused: Not on file  . Physically Abused: Not on file  . Sexually Abused: Not on file   Social History   Tobacco Use  Smoking Status Never Smoker  Smokeless Tobacco Never Used   Social History   Substance and Sexual Activity  Alcohol Use No    Family History:  Family History  Problem Relation Age of Onset  . Cancer Mother        liver  . Diabetes  Mother   . Hyperlipidemia Mother   . Hypertension Mother   . Cirrhosis Father        liver  . Hypertension Daughter   . Stroke Maternal Grandmother   . Stroke Maternal Grandfather   . Heart disease Paternal Grandmother   .  Diabetes Paternal Grandmother   . Heart disease Paternal Grandfather   . Diabetes Paternal Grandfather     Past medical history, surgical history, medications, allergies, family history and social history reviewed with patient today and changes made to appropriate areas of the chart.   Review of Systems - negative All other ROS negative except what is listed above and in the HPI.      Objective:    BP 125/79   Pulse 61   Temp 97.9 F (36.6 C)   Ht 5' 7.75" (1.721 m)   Wt 267 lb 6.4 oz (121.3 kg)   SpO2 97%   BMI 40.96 kg/m   Wt Readings from Last 3 Encounters:  06/12/20 267 lb 6.4 oz (121.3 kg)  05/28/20 263 lb (119.3 kg)  05/06/20 263 lb (119.3 kg)    Physical Exam Vitals and nursing note reviewed.  Constitutional:      General: She is awake. She is not in acute distress.    Appearance: She is well-developed and well-groomed. She is morbidly obese. She is not ill-appearing.  HENT:     Head: Normocephalic and atraumatic.     Right Ear: Hearing, tympanic membrane, ear canal and external ear normal. No drainage.     Left Ear: Hearing, tympanic membrane, ear canal and external ear normal. No drainage.     Nose: Nose normal.     Right Sinus: No maxillary sinus tenderness or frontal sinus tenderness.     Left Sinus: No maxillary sinus tenderness or frontal sinus tenderness.     Mouth/Throat:     Mouth: Mucous membranes are moist.     Pharynx: Oropharynx is clear. Uvula midline. No pharyngeal swelling, oropharyngeal exudate or posterior oropharyngeal erythema.  Eyes:     General: Lids are normal.        Right eye: No discharge.        Left eye: No discharge.     Extraocular Movements: Extraocular movements intact.     Conjunctiva/sclera:  Conjunctivae normal.     Pupils: Pupils are equal, round, and reactive to light.     Visual Fields: Right eye visual fields normal and left eye visual fields normal.  Neck:     Thyroid: No thyromegaly.     Vascular: No carotid bruit.     Trachea: Trachea normal.  Cardiovascular:     Rate and Rhythm: Normal rate and regular rhythm.     Heart sounds: Normal heart sounds. No murmur heard.  No gallop.   Pulmonary:     Effort: Pulmonary effort is normal. No accessory muscle usage or respiratory distress.     Breath sounds: Normal breath sounds.  Abdominal:     General: Bowel sounds are normal.     Palpations: Abdomen is soft. There is no hepatomegaly or splenomegaly.     Tenderness: There is no abdominal tenderness.  Musculoskeletal:        General: Normal range of motion.     Cervical back: Normal range of motion and neck supple.     Right lower leg: No edema.     Left lower leg: No edema.  Lymphadenopathy:     Head:     Right side of head: No submental, submandibular, tonsillar, preauricular or posterior auricular adenopathy.     Left side of head: No submental, submandibular, tonsillar, preauricular or posterior auricular adenopathy.     Cervical: No cervical adenopathy.  Skin:    General: Skin is warm and dry.  Capillary Refill: Capillary refill takes less than 2 seconds.     Findings: No rash.       Neurological:     Mental Status: She is alert and oriented to person, place, and time.     Cranial Nerves: Cranial nerves are intact.     Gait: Gait is intact.     Deep Tendon Reflexes: Reflexes are normal and symmetric.     Reflex Scores:      Brachioradialis reflexes are 2+ on the right side and 2+ on the left side.      Patellar reflexes are 2+ on the right side and 2+ on the left side. Psychiatric:        Attention and Perception: Attention normal.        Mood and Affect: Mood normal.        Speech: Speech normal.        Behavior: Behavior normal. Behavior is  cooperative.        Thought Content: Thought content normal.        Judgment: Judgment normal.    Results for orders placed or performed in visit on 06/12/20  WET PREP FOR [place], YEAST, CLUE   Specimen: Vaginal; Sterile Swab   Sterile Swab  Result Value Ref Range   Trichomonas Exam Negative Negative   Yeast Exam Negative Negative   Clue Cell Exam Negative Negative      Assessment & Plan:   Problem List Items Addressed This Visit      Other   Obesity - Primary    BMI 40.96.  Referral placed to weight management.  Recommended eating smaller high protein, low fat meals more frequently and exercising 30 mins a day 5 times a week with a goal of 10-15lb weight loss in the next 3 months. Patient voiced their understanding and motivation to adhere to these recommendations.       Relevant Orders   Amb Ref to Medical Weight Management    Other Visit Diagnoses    Annual physical exam       Annual labs today to include CBC, CMP, TSH, Lipid   Relevant Orders   CBC with Differential/Platelet   Comprehensive metabolic panel   Vaginal discharge       Acute with negative findings on wet prep today.  Will continue to monitor and if worsening return to office.   Relevant Orders   WET PREP FOR [place], YEAST, CLUE (Completed)   Tinea corporis       Ongoing issue -- script for Diflucan with reflls sent in, will also send in Nystatin cream for external use.  Return for ongoing or worsening.   Relevant Medications   fluconazole (DIFLUCAN) 150 MG tablet   nystatin cream (MYCOSTATIN)   Screening cholesterol level       Lipid panel today   Relevant Orders   Lipid Panel w/o Chol/HDL Ratio   Thyroid disorder screen       TSH today   Relevant Orders   TSH   Encounter for screening mammogram for malignant neoplasm of breast       Mammogram ordered   Relevant Orders   MM DIGITAL SCREENING BILATERAL       Follow up plan: Return in about 6 months (around 12/10/2020) for HTN, Prediabetes, OA,  Obesity.   LABORATORY TESTING:  - Pap smear: up to date  IMMUNIZATIONS:   - Tdap: Tetanus vaccination status reviewed: last tetanus booster within 10 years. - Influenza: Up to date - Pneumovax:  Not applicable - Prevnar: Not applicable - HPV: Not applicable - Zostavax vaccine: Refused  SCREENING: -Mammogram: Up to date  - Colonoscopy: Up to date  - Bone Density: Not applicable  -Hearing Test: Not applicable  -Spirometry: Not applicable   PATIENT COUNSELING:   Advised to take 1 mg of folate supplement per day if capable of pregnancy.   Sexuality: Discussed sexually transmitted diseases, partner selection, use of condoms, avoidance of unintended pregnancy  and contraceptive alternatives.   Advised to avoid cigarette smoking.  I discussed with the patient that most people either abstain from alcohol or drink within safe limits (<=14/week and <=4 drinks/occasion for males, <=7/weeks and <= 3 drinks/occasion for females) and that the risk for alcohol disorders and other health effects rises proportionally with the number of drinks per week and how often a drinker exceeds daily limits.  Discussed cessation/primary prevention of drug use and availability of treatment for abuse.   Diet: Encouraged to adjust caloric intake to maintain  or achieve ideal body weight, to reduce intake of dietary saturated fat and total fat, to limit sodium intake by avoiding high sodium foods and not adding table salt, and to maintain adequate dietary potassium and calcium preferably from fresh fruits, vegetables, and low-fat dairy products.    stressed the importance of regular exercise  Injury prevention: Discussed safety belts, safety helmets, smoke detector, smoking near bedding or upholstery.   Dental health: Discussed importance of regular tooth brushing, flossing, and dental visits.    NEXT PREVENTATIVE PHYSICAL DUE IN 1 YEAR. Return in about 6 months (around 12/10/2020) for HTN, Prediabetes, OA,  Obesity.

## 2020-06-13 LAB — CBC WITH DIFFERENTIAL/PLATELET
Basophils Absolute: 0.1 10*3/uL (ref 0.0–0.2)
Basos: 1 %
EOS (ABSOLUTE): 0.3 10*3/uL (ref 0.0–0.4)
Eos: 4 %
Hematocrit: 41.1 % (ref 34.0–46.6)
Hemoglobin: 13.7 g/dL (ref 11.1–15.9)
Immature Grans (Abs): 0 10*3/uL (ref 0.0–0.1)
Immature Granulocytes: 0 %
Lymphocytes Absolute: 2 10*3/uL (ref 0.7–3.1)
Lymphs: 29 %
MCH: 29.6 pg (ref 26.6–33.0)
MCHC: 33.3 g/dL (ref 31.5–35.7)
MCV: 89 fL (ref 79–97)
Monocytes Absolute: 0.5 10*3/uL (ref 0.1–0.9)
Monocytes: 8 %
Neutrophils Absolute: 4 10*3/uL (ref 1.4–7.0)
Neutrophils: 58 %
Platelets: 268 10*3/uL (ref 150–450)
RBC: 4.63 x10E6/uL (ref 3.77–5.28)
RDW: 13.2 % (ref 11.7–15.4)
WBC: 6.9 10*3/uL (ref 3.4–10.8)

## 2020-06-13 LAB — COMPREHENSIVE METABOLIC PANEL
ALT: 21 IU/L (ref 0–32)
AST: 18 IU/L (ref 0–40)
Albumin/Globulin Ratio: 1.4 (ref 1.2–2.2)
Albumin: 4 g/dL (ref 3.8–4.9)
Alkaline Phosphatase: 92 IU/L (ref 44–121)
BUN/Creatinine Ratio: 18 (ref 12–28)
BUN: 13 mg/dL (ref 8–27)
Bilirubin Total: 0.4 mg/dL (ref 0.0–1.2)
CO2: 24 mmol/L (ref 20–29)
Calcium: 9.2 mg/dL (ref 8.7–10.3)
Chloride: 105 mmol/L (ref 96–106)
Creatinine, Ser: 0.74 mg/dL (ref 0.57–1.00)
GFR calc Af Amer: 102 mL/min/{1.73_m2} (ref >59)
GFR calc non Af Amer: 88 mL/min/{1.73_m2} (ref >59)
Globulin, Total: 2.8 g/dL (ref 1.5–4.5)
Glucose: 85 mg/dL (ref 65–99)
Potassium: 4.4 mmol/L (ref 3.5–5.2)
Sodium: 143 mmol/L (ref 134–144)
Total Protein: 6.8 g/dL (ref 6.0–8.5)

## 2020-06-13 LAB — LIPID PANEL W/O CHOL/HDL RATIO
Cholesterol, Total: 219 mg/dL — ABNORMAL HIGH (ref 100–199)
HDL: 70 mg/dL (ref >39)
LDL Chol Calc (NIH): 119 mg/dL — ABNORMAL HIGH (ref 0–99)
Triglycerides: 175 mg/dL — ABNORMAL HIGH (ref 0–149)
VLDL Cholesterol Cal: 30 mg/dL (ref 5–40)

## 2020-06-13 LAB — TSH: TSH: 0.778 u[IU]/mL (ref 0.450–4.500)

## 2020-06-13 NOTE — Progress Notes (Signed)
Good morning, please let Erin Contreras know her labs have returned.  Overall they continue to look normal with exception of continue elevation in cholesterol levels.  I continue to recommend focus on diet and regular exercise.  Vaginal swab was negative for infection.  If any questions let me know.  Have a great day.

## 2020-06-13 NOTE — Telephone Encounter (Signed)
Called and LVM notifying patient.

## 2020-06-17 ENCOUNTER — Telehealth (HOSPITAL_BASED_OUTPATIENT_CLINIC_OR_DEPARTMENT_OTHER): Payer: BC Managed Care – PPO | Admitting: Student in an Organized Health Care Education/Training Program

## 2020-06-17 ENCOUNTER — Other Ambulatory Visit: Payer: Self-pay | Admitting: Nurse Practitioner

## 2020-06-17 DIAGNOSIS — M5416 Radiculopathy, lumbar region: Secondary | ICD-10-CM

## 2020-06-17 DIAGNOSIS — G8929 Other chronic pain: Secondary | ICD-10-CM

## 2020-06-17 DIAGNOSIS — Z1231 Encounter for screening mammogram for malignant neoplasm of breast: Secondary | ICD-10-CM

## 2020-06-18 NOTE — Progress Notes (Signed)
cancelled

## 2020-07-01 ENCOUNTER — Other Ambulatory Visit: Payer: Self-pay

## 2020-07-01 ENCOUNTER — Other Ambulatory Visit: Payer: Self-pay | Admitting: Nurse Practitioner

## 2020-07-01 ENCOUNTER — Ambulatory Visit
Admission: RE | Admit: 2020-07-01 | Discharge: 2020-07-01 | Disposition: A | Discharge status: Home / Self Care | Payer: BC Managed Care – PPO | Source: Ambulatory Visit | Attending: Nurse Practitioner | Admitting: Nurse Practitioner

## 2020-07-01 ENCOUNTER — Telehealth: Payer: Self-pay

## 2020-07-01 DIAGNOSIS — Z1231 Encounter for screening mammogram for malignant neoplasm of breast: Secondary | ICD-10-CM | POA: Diagnosis not present

## 2020-07-01 MED ORDER — ACYCLOVIR 400 MG PO TABS
400.0000 mg | ORAL_TABLET | Freq: Two times a day (BID) | ORAL | 0 refills | Status: DC | PRN
Start: 2020-07-01 — End: 2020-09-23

## 2020-07-01 NOTE — Telephone Encounter (Signed)
Sent in refills for this to take as needed for outbreaks

## 2020-07-01 NOTE — Telephone Encounter (Signed)
Pt stated she needed a refill of the  acyclovir (ZOVIRAX) 400 MG tablet .Please advise.

## 2020-07-01 NOTE — Telephone Encounter (Signed)
Can this be refilled? 

## 2020-07-04 DIAGNOSIS — E278 Other specified disorders of adrenal gland: Secondary | ICD-10-CM | POA: Diagnosis not present

## 2020-07-10 DIAGNOSIS — E278 Other specified disorders of adrenal gland: Secondary | ICD-10-CM | POA: Diagnosis not present

## 2020-07-15 ENCOUNTER — Other Ambulatory Visit: Payer: Self-pay

## 2020-07-15 ENCOUNTER — Encounter: Payer: Self-pay | Admitting: Student in an Organized Health Care Education/Training Program

## 2020-07-15 ENCOUNTER — Ambulatory Visit
Payer: BC Managed Care – PPO | Attending: Student in an Organized Health Care Education/Training Program | Admitting: Student in an Organized Health Care Education/Training Program

## 2020-07-15 DIAGNOSIS — M5416 Radiculopathy, lumbar region: Secondary | ICD-10-CM | POA: Diagnosis not present

## 2020-07-15 DIAGNOSIS — G8929 Other chronic pain: Secondary | ICD-10-CM | POA: Diagnosis not present

## 2020-07-15 DIAGNOSIS — M5136 Other intervertebral disc degeneration, lumbar region: Secondary | ICD-10-CM | POA: Diagnosis not present

## 2020-07-15 DIAGNOSIS — M51369 Other intervertebral disc degeneration, lumbar region without mention of lumbar back pain or lower extremity pain: Secondary | ICD-10-CM

## 2020-07-15 DIAGNOSIS — M47816 Spondylosis without myelopathy or radiculopathy, lumbar region: Secondary | ICD-10-CM

## 2020-07-15 MED ORDER — PREGABALIN 75 MG PO CAPS: ORAL_CAPSULE | ORAL | 0 refills | Status: DC

## 2020-07-15 NOTE — Progress Notes (Signed)
Patient: Erin Contreras  Service Category: E/M  Provider: Gillis Santa, MD  DOB: 14-Dec-1960  DOS: 07/15/2020  Location: Office  MRN: [id number]  Setting: Ambulatory outpatient  Referring Provider: Venita Lick, NP  Type: Established Patient  Specialty: Interventional Pain Management  PCP: Venita Lick, NP  Location: Home  Delivery: TeleHealth     Virtual Encounter - Pain Management PROVIDER NOTE: Information contained herein reflects review and annotations entered in association with encounter. Interpretation of such information and data should be left to medically-trained personnel. Information provided to patient can be located elsewhere in the medical record under "Patient Instructions". Document created using STT-dictation technology, any transcriptional errors that may result from process are unintentional.    Contact & Pharmacy Preferred: [phone] Home: [phone] (home) Mobile: [phone] (mobile) E-mail: No e-mail address on record  [id number] [id number]: [phone]: [phone]  Pre-screening  Ms. Wyble offered "in-person" vs "virtual" encounter. She indicated preferring virtual for this encounter.   Reason COVID-19*  Social distancing based on CDC and AMA recommendations.   I contacted BCleotilde Neeron 07/15/2020 via telephone.      I clearly identified myself as BGillis Santa MD. I verified that I was speaking with the correct person using two identifiers (Name: BROSIELEE CORPORAN and date of birth: 1January 26, 1962.  Consent I sought verbal advanced consent from BCleotilde Neerfor virtual visit interactions. I informed Ms. Staller of possible security and privacy concerns, risks, and limitations associated with providing "not-in-person" medical evaluation and management services. I also informed Ms. Martine of the availability of "in-person" appointments. Finally, I informed her that  there would be a charge for the virtual visit and that she could be  personally, fully or partially, financially responsible for it. Ms. HMoudyexpressed understanding and agreed to proceed.   Historic Elements   Ms. BKARIN PINEDOis a 59y.o. year old, female patient evaluated today after our last contact on 05/28/2020. Ms. HLinskey has a past medical history of Boils, Ear mass, Hemorrhoids, Herpes simplex virus infection, HSV infection, Lumbago with sciatica, OA (osteoarthritis) of knee, Obesity, Overactive bladder, PONV (postoperative nausea and vomiting), RA (rheumatoid arthritis) ([place], Sleep apnea, and Urge and stress incontinence. She also  has a past surgical history that includes Cholecystectomy (2011); Tumor removal (Right, 2010); Sleeve Gastroplasty; Fracture surgery (Right); and Knee Arthroplasty (Right, 07/11/2017). Ms. HBerkeryhas a current medication list which includes the following prescription(s): acetaminophen, acyclovir, albuterol, vitamin d3, etanercept, folic acid, methotrexate, nystatin cream, and pregabalin. She  reports that she has never smoked. She has never used smokeless tobacco. She reports that she does not drink alcohol and does not use drugs. Ms. HTostehas No Known Allergies.   HPI  Today, she is being contacted for a post-procedure assessment.   Post-Procedure Evaluation  Procedure (05/28/2020):   Type: Diagnostic Inter-Laminar Epidural Steroid Injection  #1  Region: Lumbar Level: L5-S1 Level. Laterality: Right         Sedation: Please see nurses note.  Effectiveness during initial hour after procedure(Ultra-Short Term Relief): 100%  Local anesthetic used: Long-acting (4-6 hours) Effectiveness: Defined as any analgesic benefit obtained secondary to the administration of local anesthetics. This carries significant diagnostic value as to the etiological location, or anatomical origin, of the pain. Duration of benefit is expected to coincide with the duration of  the local anesthetic used.  Effectiveness during initial 4-6  hours after procedure(Short-Term Relief): 80%  Long-term benefit: Defined as any relief past the pharmacologic duration of the local anesthetics.  Effectiveness past the initial 6 hours after procedure(Long-Term Relief): 75% for 1 week and then return of pain   Current benefits: Defined as benefit that persist at this time.   Analgesia:  Back to baseline Function: Back to baseline ROM: Back to baseline    Laboratory Chemistry Profile   Renal Lab Results  Component Value Date   BUN 13 06/12/2020   CREATININE 0.74 06/12/2020   BCR 18 06/12/2020   GFRAA 102 06/12/2020   GFRNONAA 88 06/12/2020     Hepatic Lab Results  Component Value Date   AST 18 06/12/2020   ALT 21 06/12/2020   ALBUMIN 4.0 06/12/2020   ALKPHOS 92 06/12/2020   LIPASE 106 05/10/2014     Electrolytes Lab Results  Component Value Date   NA 143 06/12/2020   K 4.4 06/12/2020   CL 105 06/12/2020   CALCIUM 9.2 06/12/2020   MG 2.0 09/04/2014   PHOS 3.7 09/04/2014     Bone Lab Results  Component Value Date   VD25OH 30.9 05/15/2020     Inflammation (CRP: Acute Phase) (ESR: Chronic Phase) Lab Results  Component Value Date   CRP <1 03/17/2018   ESRSEDRATE 19 03/17/2018       Note: Above Lab results reviewed.  Imaging  MM 3D SCREEN BREAST BILATERAL CLINICAL DATA:  Screening.  EXAM: DIGITAL SCREENING BILATERAL MAMMOGRAM WITH TOMO AND CAD  COMPARISON:  Previous exam(s).  ACR Breast Density Category a: The breast tissue is almost entirely fatty.  FINDINGS: There are no findings suspicious for malignancy. Images were processed with CAD.  IMPRESSION: No mammographic evidence of malignancy. A result letter of this screening mammogram will be mailed directly to the patient.  RECOMMENDATION: Screening mammogram in one year. (Code:[id number])  BI-RADS CATEGORY  1: Negative.  Electronically Signed   By: Kristopher Oppenheim M.D.   On:  07/04/2020 08:56  Assessment  The primary encounter diagnosis was Chronic radicular lumbar pain (right). Diagnoses of Lumbar radiculopathy (right), Lumbar facet arthropathy, Lumbar spondylosis, and Lumbar degenerative disc disease were also pertinent to this visit.  Plan of Care  [name] has a current medication list which includes the following long-term medication(s): albuterol, etanercept, and pregabalin.  Unfortunately, that he did not receive any significant long-term benefit with her lumbar epidural steroid injection that was done at right L5-S1 on 05/28/2020.  We reviewed her lumbar MRI again today in great detail..  She has significant facet arthrosis and degenerative changes in her lower lumbar spine.  She states that she has difficulty walking an extended period of time.  She finds bending forward helpful in alleviating some of her pain symptoms.  She states that her pain is particularly high on her work days as she has to stand for 12 hours on a concrete floor.  We discussed the importance of proper shoes and orthotics.  She states that she will look into this.  We also discussed diagnostic lumbar facet medial branch nerve blocks to address her low back pain related to facet arthropathy and lumbar degenerative changes.  She will think about this further.  In the meantime given that she is not receiving any significant analgesic benefit from gabapentin, recommend transitioning to Lyrica as below.  Risks and benefits reviewed and patient like to proceed.  Patient does not find benefit with Lyrica, we can consider Cymbalta in the future.  Also consider diagnostic lumbar facet medial branch nerve blocks and possible radiofrequency ablation for low back pain related to lumbar facet arthropathy.   Pharmacotherapy (Medications Ordered): Meds ordered this encounter  Medications  . pregabalin (LYRICA) 75 MG capsule    Sig: Take 1 capsule (75 mg total) by mouth at bedtime for 20 days,  THEN 1 capsule (75 mg total) 2 (two) times daily.    Dispense:  80 capsule    Refill:  0    Fill one day early if pharmacy is closed on scheduled refill date. May substitute for generic if available.   Follow-up plan:   Return in about 5 weeks (around 08/19/2020) for Medication Management, in person.     Right L5-S1 ESI 05/28/2020 only helped for 1 week, return of pain.  Consider diagnostic lumbar facet medial branch nerve blocks in future.    Recent Visits Date Type Provider Dept  05/28/20 Procedure visit Gillis Santa, MD [facility]  05/06/20 Office Visit Gillis Santa, MD [facility]  Showing recent visits within past 90 days and meeting all other requirements Today's Visits Date Type Provider Dept  07/15/20 Telemedicine Gillis Santa, MD [facility]  Showing today's visits and meeting all other requirements Future Appointments No visits were found meeting these conditions. Showing future appointments within next 90 days and meeting all other requirements  I discussed the assessment and treatment plan with the patient. The patient was provided an opportunity to ask questions and all were answered. The patient agreed with the plan and demonstrated an understanding of the instructions.  Patient advised to call back or seek an in-person evaluation if the symptoms or condition worsens.  Duration of encounter: 20 minutes.  Note by: Gillis Santa, MD Date: 07/15/2020; Time: 3:13 PM

## 2020-07-24 ENCOUNTER — Other Ambulatory Visit: Payer: Self-pay

## 2020-07-24 ENCOUNTER — Ambulatory Visit (INDEPENDENT_AMBULATORY_CARE_PROVIDER_SITE_OTHER): Payer: BC Managed Care – PPO | Admitting: Nurse Practitioner

## 2020-07-24 ENCOUNTER — Encounter: Payer: Self-pay | Admitting: Nurse Practitioner

## 2020-07-24 VITALS — BP 113/75 | HR 86 | Temp 97.6°F | Wt 269.0 lb

## 2020-07-24 DIAGNOSIS — Z113 Encounter for screening for infections with a predominantly sexual mode of transmission: Secondary | ICD-10-CM

## 2020-07-24 DIAGNOSIS — M170 Bilateral primary osteoarthritis of knee: Secondary | ICD-10-CM | POA: Diagnosis not present

## 2020-07-24 DIAGNOSIS — M47816 Spondylosis without myelopathy or radiculopathy, lumbar region: Secondary | ICD-10-CM | POA: Diagnosis not present

## 2020-07-24 DIAGNOSIS — M0579 Rheumatoid arthritis with rheumatoid factor of multiple sites without organ or systems involvement: Secondary | ICD-10-CM | POA: Diagnosis not present

## 2020-07-24 DIAGNOSIS — Z6841 Body Mass Index (BMI) 40.0 and over, adult: Secondary | ICD-10-CM

## 2020-07-24 DIAGNOSIS — N898 Other specified noninflammatory disorders of vagina: Secondary | ICD-10-CM | POA: Diagnosis not present

## 2020-07-24 DIAGNOSIS — Z79899 Other long term (current) drug therapy: Secondary | ICD-10-CM | POA: Diagnosis not present

## 2020-07-24 LAB — URINALYSIS, ROUTINE W REFLEX MICROSCOPIC
Bilirubin, UA: NEGATIVE
Glucose, UA: NEGATIVE
Ketones, UA: NEGATIVE
Leukocytes,UA: NEGATIVE
Nitrite, UA: NEGATIVE
Protein,UA: NEGATIVE
RBC, UA: NEGATIVE
Specific Gravity, UA: >1.03 — ABNORMAL HIGH (ref 1.005–1.030)
Urobilinogen, Ur: 0.2 mg/dL (ref 0.2–1.0)
pH, UA: 5 (ref 5.0–7.5)

## 2020-07-24 LAB — WET PREP FOR TRICH, YEAST, CLUE
Clue Cell Exam: POSITIVE — AB
Trichomonas Exam: NEGATIVE
Yeast Exam: NEGATIVE

## 2020-07-24 MED ORDER — METRONIDAZOLE 500 MG PO TABS: 500.0000 mg | ORAL_TABLET | Freq: Two times a day (BID) | ORAL | 0 refills | Status: AC

## 2020-07-24 NOTE — Assessment & Plan Note (Signed)
BMI 41.20.  Recommended eating smaller high protein, low fat meals more frequently and exercising 30 mins a day 5 times a week with a goal of 10-15lb weight loss in the next 3 months. Patient voiced their understanding and motivation to adhere to these recommendations.

## 2020-07-24 NOTE — Assessment & Plan Note (Signed)
Acute x 3 weeks.  UA negative and wet prep with + clue cells.  Educated her on bacterial vaginosis and treatment regimen.  Will send in Flagyl for her to take orally twice a day for 7 days.  Discussed with her if ongoing discharge or discomfort to return to office.

## 2020-07-24 NOTE — Patient Instructions (Signed)
Cymbalta (Duloxetine) would this be okay to take with current medications?   Duloxetine delayed-release capsules What is this medicine? DULOXETINE (doo LOX e teen) is used to treat depression, anxiety, and different types of chronic pain. This medicine may be used for other purposes; ask your health care provider or pharmacist if you have questions. COMMON BRAND NAME(S): Cymbalta, Creig Hines, Irenka What should I tell my health care provider before I take this medicine? They need to know if you have any of these conditions:  bipolar disorder  glaucoma  high blood pressure  kidney disease  liver disease  seizures  suicidal thoughts, plans or attempt; a previous suicide attempt by you or a family member  take medicines that treat or prevent blood clots  taken medicines called MAOIs like Carbex, Eldepryl, Marplan, Nardil, and Parnate within 14 days  trouble passing urine  an unusual reaction to duloxetine, other medicines, foods, dyes, or preservatives  pregnant or trying to get pregnant  breast-feeding How should I use this medicine? Take this medicine by mouth with a glass of water. Follow the directions on the prescription label. Do not crush, cut or chew some capsules of this medicine. Some capsules may be opened and sprinkled on applesauce. Check with your doctor or pharmacist if you are not sure. You can take this medicine with or without food. Take your medicine at regular intervals. Do not take your medicine more often than directed. Do not stop taking this medicine suddenly except upon the advice of your doctor. Stopping this medicine too quickly may cause serious side effects or your condition may worsen. A special MedGuide will be given to you by the pharmacist with each prescription and refill. Be sure to read this information carefully each time. Talk to your pediatrician regarding the use of this medicine in children. While this drug may be prescribed for children as  young as 18 years of age for selected conditions, precautions do apply. Overdosage: If you think you have taken too much of this medicine contact a poison control center or emergency room at once. NOTE: This medicine is only for you. Do not share this medicine with others. What if I miss a dose? If you miss a dose, take it as soon as you can. If it is almost time for your next dose, take only that dose. Do not take double or extra doses. What may interact with this medicine? Do not take this medicine with any of the following medications:  desvenlafaxine  levomilnacipran  linezolid  MAOIs like Carbex, Eldepryl, Marplan, Nardil, and Parnate  methylene blue (injected into a vein)  milnacipran  thioridazine  venlafaxine This medicine may also interact with the following medications:  alcohol  amphetamines  aspirin and aspirin-like medicines  certain antibiotics like ciprofloxacin and enoxacin  certain medicines for blood pressure, heart disease, irregular heart beat  certain medicines for depression, anxiety, or psychotic disturbances  certain medicines for migraine headache like almotriptan, eletriptan, frovatriptan, naratriptan, rizatriptan, sumatriptan, zolmitriptan  certain medicines that treat or prevent blood clots like warfarin, enoxaparin, and dalteparin  cimetidine  fentanyl  lithium  NSAIDS, medicines for pain and inflammation, like ibuprofen or naproxen  phentermine  procarbazine  rasagiline  sibutramine  St. John's wort  theophylline  tramadol  tryptophan This list may not describe all possible interactions. Give your health care provider a list of all the medicines, herbs, non-prescription drugs, or dietary supplements you use. Also tell them if you smoke, drink alcohol, or use illegal drugs.  Some items may interact with your medicine. What should I watch for while using this medicine? Tell your doctor if your symptoms do not get better or if  they get worse. Visit your doctor or healthcare provider for regular checks on your progress. Because it may take several weeks to see the full effects of this medicine, it is important to continue your treatment as prescribed by your doctor. This medicine may cause serious skin reactions. They can happen weeks to months after starting the medicine. Contact your healthcare provider right away if you notice fevers or flu-like symptoms with a rash. The rash may be red or purple and then turn into blisters or peeling of the skin. Or, you might notice a red rash with swelling of the face, lips, or lymph nodes in your neck or under your arms. Patients and their families should watch out for new or worsening thoughts of suicide or depression. Also watch out for sudden changes in feelings such as feeling anxious, agitated, panicky, irritable, hostile, aggressive, impulsive, severely restless, overly excited and hyperactive, or not being able to sleep. If this happens, especially at the beginning of treatment or after a change in dose, call your healthcare provider. You may get drowsy or dizzy. Do not drive, use machinery, or do anything that needs mental alertness until you know how this medicine affects you. Do not stand or sit up quickly, especially if you are an older patient. This reduces the risk of dizzy or fainting spells. Alcohol may interfere with the effect of this medicine. Avoid alcoholic drinks. This medicine can cause an increase in blood pressure. This medicine can also cause a sudden drop in your blood pressure, which may make you feel faint and increase the chance of a fall. These effects are most common when you first start the medicine or when the dose is increased, or during use of other medicines that can cause a sudden drop in blood pressure. Check with your doctor for instructions on monitoring your blood pressure while taking this medicine. Your mouth may get dry. Chewing sugarless gum or  sucking hard candy, and drinking plenty of water, may help. Contact your doctor if the problem does not go away or is severe. What side effects may I notice from receiving this medicine? Side effects that you should report to your doctor or health care professional as soon as possible:  allergic reactions like skin rash, itching or hives, swelling of the face, lips, or tongue  anxious  breathing problems  confusion  changes in vision  chest pain  confusion  elevated mood, decreased need for sleep, racing thoughts, impulsive behavior  eye pain  fast, irregular heartbeat  feeling faint or lightheaded, falls  feeling agitated, angry, or irritable  hallucination, loss of contact with reality  high blood pressure  loss of balance or coordination  palpitations  redness, blistering, peeling or loosening of the skin, including inside the mouth  restlessness, pacing, inability to keep still  seizures  stiff muscles  suicidal thoughts or other mood changes  trouble passing urine or change in the amount of urine  trouble sleeping  unusual bleeding or bruising  unusually weak or tired  vomiting  yellowing of the eyes or skin Side effects that usually do not require medical attention (report to your doctor or health care professional if they continue or are bothersome):  change in sex drive or performance  change in appetite or weight  constipation  dizziness  dry mouth  headache  increased sweating  nausea  tired This list may not describe all possible side effects. Call your doctor for medical advice about side effects. You may report side effects to FDA at 1-800-FDA-1088. Where should I keep my medicine? Keep out of the reach of children. Store at room temperature between 15 and 30 degrees C (59 to 86 degrees F). Throw away any unused medicine after the expiration date. NOTE: This sheet is a summary. It may not cover all possible information. If  you have questions about this medicine, talk to your doctor, pharmacist, or health care provider.  2020 Elsevier/Gold Standard (2018-10-19 13:47:50)

## 2020-07-24 NOTE — Progress Notes (Signed)
BP 113/75   Pulse 86   Temp 97.6 F (36.4 C)   Wt 269 lb (122 kg) Comment: Patient reported  SpO2 98%   BMI 41.20 kg/m    Subjective:    Patient ID: Erin Contreras, female    DOB: 06/10/1961, 59 y.o.   MRN: [id number]  HPI: Erin Contreras is a 59 y.o. female  Chief Complaint  Patient presents with  . Vaginal Discharge   VAGINAL DISCHARGE Started 3 weeks ago.  Has new partner, has not been sexually active yet. Duration: weeks Discharge description: white  Pruritus: yes Dysuria: yes Malodorous: yes Urinary frequency: no Fevers: no Abdominal pain: occasional  Sexual activity: not sexually active History of sexually transmitted diseases: yes Recent antibiotic use: no Context: stable Treatments attempted: none  Relevant past medical, surgical, family and social history reviewed and updated as indicated. Interim medical history since our last visit reviewed. Allergies and medications reviewed and updated.  Review of Systems  Constitutional: Negative for activity change, appetite change, diaphoresis, fatigue and fever.  Respiratory: Negative for cough, chest tightness and shortness of breath.   Cardiovascular: Negative for chest pain, palpitations and leg swelling.  Gastrointestinal: Positive for abdominal pain. Negative for abdominal distention, constipation, diarrhea, nausea and vomiting.  Genitourinary: Positive for dysuria and vaginal discharge. Negative for decreased urine volume, frequency, genital sores, hematuria, urgency, vaginal bleeding and vaginal pain.  Neurological: Negative.   Psychiatric/Behavioral: Negative.     Per HPI unless specifically indicated above     Objective:    BP 113/75   Pulse 86   Temp 97.6 F (36.4 C)   Wt 269 lb (122 kg) Comment: Patient reported  SpO2 98%   BMI 41.20 kg/m   Wt Readings from Last 3 Encounters:  07/24/20 269 lb (122 kg)  06/12/20 267 lb 6.4 oz (121.3 kg)  05/28/20 263 lb (119.3 kg)    Physical  Exam Vitals and nursing note reviewed.  Constitutional:      General: She is awake. She is not in acute distress.    Appearance: She is well-developed and well-groomed. She is obese. She is not ill-appearing.  HENT:     Head: Normocephalic.     Right Ear: Hearing normal.     Left Ear: Hearing normal.  Eyes:     General: Lids are normal.        Right eye: No discharge.        Left eye: No discharge.     Conjunctiva/sclera: Conjunctivae normal.     Pupils: Pupils are equal, round, and reactive to light.  Neck:     Vascular: No carotid bruit.  Cardiovascular:     Rate and Rhythm: Normal rate and regular rhythm.     Heart sounds: Normal heart sounds. No murmur heard. No gallop.   Pulmonary:     Effort: Pulmonary effort is normal. No accessory muscle usage or respiratory distress.     Breath sounds: Normal breath sounds.  Abdominal:     General: Bowel sounds are normal. There is no distension.     Palpations: Abdomen is soft.     Tenderness: There is no abdominal tenderness.  Musculoskeletal:     Cervical back: Normal range of motion and neck supple.     Right lower leg: No edema.     Left lower leg: No edema.  Skin:    General: Skin is warm and dry.  Neurological:     Mental Status: She is alert and oriented  to person, place, and time.  Psychiatric:        Attention and Perception: Attention normal.        Mood and Affect: Mood normal.        Speech: Speech normal.        Behavior: Behavior normal. Behavior is cooperative.        Thought Content: Thought content normal.     Results for orders placed or performed in visit on 06/12/20  WET PREP FOR [place], YEAST, CLUE   Specimen: Vaginal; Sterile Swab   Sterile Swab  Result Value Ref Range   Trichomonas Exam Negative Negative   Yeast Exam Negative Negative   Clue Cell Exam Negative Negative  CBC with Differential/Platelet  Result Value Ref Range   WBC 6.9 3.4 - 10.8 x10E3/uL   RBC 4.63 3.77 - 5.28 x10E6/uL    Hemoglobin 13.7 11.1 - 15.9 g/dL   Hematocrit 41.1 34.0 - 46.6 %   MCV 89 79 - 97 fL   MCH 29.6 26.6 - 33.0 pg   MCHC 33.3 31.5 - 35.7 g/dL   RDW 13.2 11.7 - 15.4 %   Platelets 268 150 - 450 x10E3/uL   Neutrophils 58 Not Estab. %   Lymphs 29 Not Estab. %   Monocytes 8 Not Estab. %   Eos 4 Not Estab. %   Basos 1 Not Estab. %   Neutrophils Absolute 4.0 1.4 - 7.0 x10E3/uL   Lymphocytes Absolute 2.0 0.7 - 3.1 x10E3/uL   Monocytes Absolute 0.5 0.1 - 0.9 x10E3/uL   EOS (ABSOLUTE) 0.3 0.0 - 0.4 x10E3/uL   Basophils Absolute 0.1 0.0 - 0.2 x10E3/uL   Immature Granulocytes 0 Not Estab. %   Immature Grans (Abs) 0.0 0.0 - 0.1 x10E3/uL  Comprehensive metabolic panel  Result Value Ref Range   Glucose 85 65 - 99 mg/dL   BUN 13 8 - 27 mg/dL   Creatinine, Ser 0.74 0.57 - 1.00 mg/dL   GFR calc non Af Amer 88 >59 mL/min/1.73   GFR calc Af Amer 102 >59 mL/min/1.73   BUN/Creatinine Ratio 18 12 - 28   Sodium 143 134 - 144 mmol/L   Potassium 4.4 3.5 - 5.2 mmol/L   Chloride 105 96 - 106 mmol/L   CO2 24 20 - 29 mmol/L   Calcium 9.2 8.7 - 10.3 mg/dL   Total Protein 6.8 6.0 - 8.5 g/dL   Albumin 4.0 3.8 - 4.9 g/dL   Globulin, Total 2.8 1.5 - 4.5 g/dL   Albumin/Globulin Ratio 1.4 1.2 - 2.2   Bilirubin Total 0.4 0.0 - 1.2 mg/dL   Alkaline Phosphatase 92 44 - 121 IU/L   AST 18 0 - 40 IU/L   ALT 21 0 - 32 IU/L  Lipid Panel w/o Chol/HDL Ratio  Result Value Ref Range   Cholesterol, Total 219 (H) 100 - 199 mg/dL   Triglycerides 175 (H) 0 - 149 mg/dL   HDL 70 >39 mg/dL   VLDL Cholesterol Cal 30 5 - 40 mg/dL   LDL Chol Calc (NIH) 119 (H) 0 - 99 mg/dL  TSH  Result Value Ref Range   TSH 0.778 0.450 - 4.500 uIU/mL      Assessment & Plan:   Problem List Items Addressed This Visit      Other   Obesity - Primary    BMI 41.20.  Recommended eating smaller high protein, low fat meals more frequently and exercising 30 mins a day 5 times a week with a goal of  10-15lb weight loss in the next 3 months.  Patient voiced their understanding and motivation to adhere to these recommendations.       Vaginal discharge    Acute x 3 weeks.  UA negative and wet prep with + clue cells.  Educated her on bacterial vaginosis and treatment regimen.  Will send in Flagyl for her to take orally twice a day for 7 days.  Discussed with her if ongoing discharge or discomfort to return to office.      Relevant Orders   WET PREP FOR TRICH, YEAST, CLUE   Urinalysis, Routine w reflex microscopic    Other Visit Diagnoses    Screen for STD (sexually transmitted disease)       STD screening labs today.   Relevant Orders   HIV Antibody (routine testing w rflx)   RPR   GC/Chlamydia Probe Amp       Follow up plan: Return if symptoms worsen or fail to improve.

## 2020-07-25 LAB — RPR: RPR Ser Ql: NONREACTIVE

## 2020-07-25 LAB — HIV ANTIBODY (ROUTINE TESTING W REFLEX): HIV Screen 4th Generation wRfx: NONREACTIVE

## 2020-07-28 LAB — GC/CHLAMYDIA PROBE AMP
Chlamydia trachomatis, NAA: NEGATIVE
Neisseria Gonorrhoeae by PCR: NEGATIVE

## 2020-07-28 NOTE — Progress Notes (Signed)
Please let Erin Contreras know that all of her STD testing returned negative.  Good news:)  Have a wonderful day!!

## 2020-08-21 ENCOUNTER — Ambulatory Visit
Payer: BC Managed Care – PPO | Attending: Student in an Organized Health Care Education/Training Program | Admitting: Student in an Organized Health Care Education/Training Program

## 2020-08-21 ENCOUNTER — Other Ambulatory Visit: Payer: Self-pay

## 2020-08-21 ENCOUNTER — Encounter: Payer: Self-pay | Admitting: Student in an Organized Health Care Education/Training Program

## 2020-08-21 VITALS — BP 142/87 | HR 78 | Temp 97.1°F | Resp 18 | Ht 69.0 in | Wt 270.0 lb

## 2020-08-21 DIAGNOSIS — G8929 Other chronic pain: Secondary | ICD-10-CM

## 2020-08-21 DIAGNOSIS — M5136 Other intervertebral disc degeneration, lumbar region: Secondary | ICD-10-CM | POA: Insufficient documentation

## 2020-08-21 DIAGNOSIS — M47816 Spondylosis without myelopathy or radiculopathy, lumbar region: Secondary | ICD-10-CM

## 2020-08-21 DIAGNOSIS — M533 Sacrococcygeal disorders, not elsewhere classified: Secondary | ICD-10-CM

## 2020-08-21 DIAGNOSIS — M47818 Spondylosis without myelopathy or radiculopathy, sacral and sacrococcygeal region: Secondary | ICD-10-CM

## 2020-08-21 DIAGNOSIS — G894 Chronic pain syndrome: Secondary | ICD-10-CM | POA: Diagnosis not present

## 2020-08-21 DIAGNOSIS — M5416 Radiculopathy, lumbar region: Secondary | ICD-10-CM | POA: Diagnosis not present

## 2020-08-21 DIAGNOSIS — M461 Sacroiliitis, not elsewhere classified: Secondary | ICD-10-CM

## 2020-08-21 DIAGNOSIS — M51369 Other intervertebral disc degeneration, lumbar region without mention of lumbar back pain or lower extremity pain: Secondary | ICD-10-CM

## 2020-08-21 MED ORDER — DULOXETINE HCL 20 MG PO CPEP: 40.0000 mg | ORAL_CAPSULE | Freq: Every day | ORAL | 1 refills | Status: DC

## 2020-08-21 MED ORDER — PREGABALIN 75 MG PO CAPS: 75.0000 mg | ORAL_CAPSULE | Freq: Two times a day (BID) | ORAL | 1 refills | Status: DC

## 2020-08-21 NOTE — Progress Notes (Signed)
PROVIDER NOTE: Information contained herein reflects review and annotations entered in association with encounter. Interpretation of such information and data should be left to medically-trained personnel. Information provided to patient can be located elsewhere in the medical record under "Patient Instructions". Document created using STT-dictation technology, any transcriptional errors that may result from process are unintentional.    Patient: Erin Contreras  Service Category: E/M  Provider: Edward Jolly, MD  DOB: 1960-11-20  DOS: 08/21/2020  Specialty: Interventional Pain Management  MRN: [id number]  Setting: Ambulatory outpatient  PCP: Marjie Skiff, NP  Type: Established Patient    Referring Provider: Marjie Skiff, NP  Location: Office  Delivery: Face-to-face     HPI  [name], a 60 y.o. year old female, is here today because of her Chronic radicular lumbar pain [M54.16, G89.29]. Ms. Huizinga primary complain today is Back Pain (low) and Hip Pain (Right and radiates to mid thigh laterally) Last encounter: My last encounter with her was on 05/28/2020. Pertinent problems: Ms. Zavada has OA (osteoarthritis) of knee; Obesity; Chronic radicular lumbar pain; Lumbar spondylosis; Lumbar degenerative disc disease; Sacroiliac joint pain; SI joint arthritis; and Chronic pain syndrome on their pertinent problem list. Pain Assessment: Severity of Chronic pain is reported as a 8 /10. Location: Back Lower/right hip and right thigh. Onset: More than a month ago. Quality: Sore,Aching,Constant,Burning. Timing: Constant. Modifying factor(s): meds, stretching, hot showers. Vitals:  height is 5\' 9"  (1.753 m) and weight is 270 lb (122.5 kg). Her temporal temperature is 97.1 F (36.2 C) (abnormal). Her blood pressure is 142/87 (abnormal) and her pulse is 78. Her respiration is 18 and oxygen saturation is 99%.   Reason for encounter: medication management.     Patient presents today for medication  management.  Her last visit with me was on 07/15/2020 at which point she was instructed to titrate Lyrica to 75 mg twice daily.  Patient states that she is currently taking Lyrica only at night.  She does not know why she did not increase her dose.  She also saw Dr. 07/17/2020 with rheumatology and he started her on Cymbalta at 30 mg.  She states that she is noticing mild pain relief from this.  No side effects.  Recommend that she increase to 40 mg daily.  Continue this for 1 month if she is experiencing additional analgesic benefit on this regiment increase her Lyrica to 75 mg twice a day.  Patient endorsed understanding.  ROS  Constitutional: Denies any fever or chills Gastrointestinal: No reported hemesis, hematochezia, vomiting, or acute GI distress Musculoskeletal: Low back, right hip pain Neurological: No reported episodes of acute onset apraxia, aphasia, dysarthria, agnosia, amnesia, paralysis, loss of coordination, or loss of consciousness  Medication Review  DULoxetine, Vitamin D3, acyclovir, albuterol, etanercept, folic acid, methotrexate, nystatin cream, and pregabalin  History Review  Allergy: Ms. Domino has No Known Allergies. Drug: Ms. Spruell  reports no history of drug use. Alcohol:  reports no history of alcohol use. Tobacco:  reports that she has never smoked. She has never used smokeless tobacco. Social: Ms. Ashton  reports that she has never smoked. She has never used smokeless tobacco. She reports that she does not drink alcohol and does not use drugs. Medical:  has a past medical history of Boils, Ear mass, Hemorrhoids, Herpes simplex virus infection, HSV infection, Lumbago with sciatica, OA (osteoarthritis) of knee, Obesity, Overactive bladder, PONV (postoperative nausea and vomiting), RA (rheumatoid arthritis) (HCC), Sleep apnea, and Urge and stress incontinence.  Surgical: Ms. Steinmetz  has a past surgical history that includes Cholecystectomy (2011); Tumor removal (Right, 2010);  Sleeve Gastroplasty; Fracture surgery (Right); and Knee Arthroplasty (Right, 07/11/2017). Family: family history includes Cancer in her mother; Cirrhosis in her father; Diabetes in her mother, paternal grandfather, and paternal grandmother; Heart disease in her paternal grandfather and paternal grandmother; Hyperlipidemia in her mother; Hypertension in her daughter and mother; Stroke in her maternal grandfather and maternal grandmother.  Laboratory Chemistry Profile   Renal Lab Results  Component Value Date   BUN 13 06/12/2020   CREATININE 0.74 06/12/2020   BCR 18 06/12/2020   GFRAA 102 06/12/2020   GFRNONAA 88 06/12/2020     Hepatic Lab Results  Component Value Date   AST 18 06/12/2020   ALT 21 06/12/2020   ALBUMIN 4.0 06/12/2020   ALKPHOS 92 06/12/2020   LIPASE 106 05/10/2014     Electrolytes Lab Results  Component Value Date   NA 143 06/12/2020   K 4.4 06/12/2020   CL 105 06/12/2020   CALCIUM 9.2 06/12/2020   MG 2.0 09/04/2014   PHOS 3.7 09/04/2014     Bone Lab Results  Component Value Date   VD25OH 30.9 05/15/2020     Inflammation (CRP: Acute Phase) (ESR: Chronic Phase) Lab Results  Component Value Date   CRP <1 03/17/2018   ESRSEDRATE 19 03/17/2018       Note: Above Lab results reviewed.  Recent Imaging Review  MM 3D SCREEN BREAST BILATERAL CLINICAL DATA:  Screening.  EXAM: DIGITAL SCREENING BILATERAL MAMMOGRAM WITH TOMO AND CAD  COMPARISON:  Previous exam(s).  ACR Breast Density Category a: The breast tissue is almost entirely fatty.  FINDINGS: There are no findings suspicious for malignancy. Images were processed with CAD.  IMPRESSION: No mammographic evidence of malignancy. A result letter of this screening mammogram will be mailed directly to the patient.  RECOMMENDATION: Screening mammogram in one year. (Code:[id number])  BI-RADS CATEGORY  1: Negative.  Electronically Signed   By: Kristopher Oppenheim M.D.   On: 07/04/2020 08:56 Note:  Reviewed        Physical Exam  General appearance: Well nourished, well developed, and well hydrated. In no apparent acute distress Mental status: Alert, oriented x 3 (person, place, & time)       Respiratory: No evidence of acute respiratory distress Eyes: PERLA Vitals: BP (!) 142/87   Pulse 78   Temp (!) 97.1 F (36.2 C) (Temporal)   Resp 18   Ht $R'5\' 9"'et$  (1.753 m)   Wt 270 lb (122.5 kg)   SpO2 99%   BMI 39.87 kg/m  BMI: Estimated body mass index is 39.87 kg/m as calculated from the following:   Height as of this encounter: $RemoveBeforeD'5\' 9"'elaqeoNuixiNzd$  (1.753 m).   Weight as of this encounter: 270 lb (122.5 kg). Ideal: Ideal body weight: 66.2 kg (145 lb 15.1 oz) Adjusted ideal body weight: 88.7 kg (195 lb 9.1 oz)   Lumbar Exam  Skin & Axial Inspection: No masses, redness, or swelling Alignment: Symmetrical Functional ROM: Pain restricted ROM       Stability: No instability detected Muscle Tone/Strength: Functionally intact. No obvious neuro-muscular anomalies detected. Sensory (Neurological): Dermatomal pain pattern right side Palpation: No palpable anomalies       Provocative Tests: Hyperextension/rotation test: (+) bilaterally for facet joint pain. Lumbar quadrant test (Kemp's test): (+) on the right for foraminal stenosis Lateral bending test: deferred today       Patrick's Maneuver: (+) for right-sided S-I arthralgia  FABER* test: (+) for right-sided S-I arthralgia             S-I anterior distraction/compression test: deferred today         S-I lateral compression test: deferred today         S-I Thigh-thrust test: deferred today         S-I Gaenslen's test: deferred today         *(Flexion, ABduction and External Rotation)  Gait & Posture Assessment  Ambulation: Unassisted Gait: Antalgic gait (limping) Posture: WNL   Lower Extremity Exam    Side: Right lower extremity  Side: Left lower extremity  Stability: No instability observed          Stability: No instability  observed          Skin & Extremity Inspection: Skin color, temperature, and hair growth are WNL. No peripheral edema or cyanosis. No masses, redness, swelling, asymmetry, or associated skin lesions. No contractures.  Skin & Extremity Inspection: Skin color, temperature, and hair growth are WNL. No peripheral edema or cyanosis. No masses, redness, swelling, asymmetry, or associated skin lesions. No contractures.  Functional ROM: Pain restricted ROM for hip and knee joints Limited SLR (straight leg raise)  Functional ROM: Unrestricted ROM                  Muscle Tone/Strength: Functionally intact. No obvious neuro-muscular anomalies detected.  Muscle Tone/Strength: Functionally intact. No obvious neuro-muscular anomalies detected.  Sensory (Neurological): Arthropathic arthralgia        Sensory (Neurological): Arthropathic arthralgia           Assessment   Diagnosis  1. Chronic radicular lumbar pain (right)   2. Lumbar radiculopathy (right)   3. Lumbar facet arthropathy   4. Lumbar spondylosis   5. Lumbar degenerative disc disease   6. Sacroiliac joint pain   7. SI joint arthritis   8. Chronic pain syndrome       Plan of Care  Ms. [name] has a current medication list which includes the following long-term medication(s): albuterol, etanercept, duloxetine, and pregabalin.  1.  Increase Cymbalta from 30 mg daily to 40 mg daily. 2.  Continue Lyrica 75 mg nightly.  If no benefit with increase in Cymbalta, okay to increase Lyrica to 75 mg twice daily. 3.  Consider diagnostic lumbar facet medial branch nerve blocks for low back pain secondary to lumbar facet arthropathy, lumbar spondylosis.  Positive diagnostic block, can consider RFA.  Pharmacotherapy (Medications Ordered): Meds ordered this encounter  Medications  . pregabalin (LYRICA) 75 MG capsule    Sig: Take 1 capsule (75 mg total) by mouth 2 (two) times daily.    Dispense:  60 capsule    Refill:  1    Fill one day  early if pharmacy is closed on scheduled refill date. May substitute for generic if available.  . DULoxetine (CYMBALTA) 20 MG capsule    Sig: Take 2 capsules (40 mg total) by mouth daily.    Dispense:  60 capsule    Refill:  1   Follow-up plan:   Return in about 8 weeks (around 10/16/2020) for Medication Management, in person.     Right L5-S1 ESI 05/28/2020 only helped for 1 week, return of pain.  Consider diagnostic lumbar facet medial branch nerve blocks in future.     Recent Visits Date Type Provider Dept  07/15/20 Telemedicine Gillis Santa, MD [facility]  05/28/20 Procedure visit Gillis Santa, MD [facility]  Showing recent visits within past 90 days and meeting all other requirements Today's Visits Date Type Provider Dept  08/21/20 Office Visit Gillis Santa, MD [facility]  Showing today's visits and meeting all other requirements Future Appointments Date Type Provider Dept  10/02/20 Appointment Gillis Santa, MD [facility]  Showing future appointments within next 90 days and meeting all other requirements  I discussed the assessment and treatment plan with the patient. The patient was provided an opportunity to ask questions and all were answered. The patient agreed with the plan and demonstrated an understanding of the instructions.  Patient advised to call back or seek an in-person evaluation if the symptoms or condition worsens.  Duration of encounter: 30 minutes.  Note by: Gillis Santa, MD Date: 08/21/2020; Time: 12:33 PM

## 2020-08-21 NOTE — Progress Notes (Signed)
Safety precautions to be maintained throughout the outpatient stay will include: orient to surroundings, keep bed in low position, maintain call bell within reach at all times, provide assistance with transfer out of bed and ambulation.  

## 2020-08-25 DIAGNOSIS — Z0189 Encounter for other specified special examinations: Secondary | ICD-10-CM | POA: Diagnosis not present

## 2020-08-26 DIAGNOSIS — Z713 Dietary counseling and surveillance: Secondary | ICD-10-CM | POA: Diagnosis not present

## 2020-08-26 DIAGNOSIS — Z043 Encounter for examination and observation following other accident: Secondary | ICD-10-CM | POA: Diagnosis not present

## 2020-09-03 ENCOUNTER — Encounter: Payer: Self-pay | Admitting: Dietician

## 2020-09-03 ENCOUNTER — Encounter: Payer: BC Managed Care – PPO | Attending: Nurse Practitioner | Admitting: Dietician

## 2020-09-03 ENCOUNTER — Other Ambulatory Visit: Payer: Self-pay

## 2020-09-03 VITALS — Ht 69.0 in | Wt 268.8 lb

## 2020-09-03 DIAGNOSIS — E669 Obesity, unspecified: Secondary | ICD-10-CM | POA: Diagnosis not present

## 2020-09-03 DIAGNOSIS — Z6839 Body mass index (BMI) 39.0-39.9, adult: Secondary | ICD-10-CM | POA: Diagnosis not present

## 2020-09-03 NOTE — Progress Notes (Signed)
Medical Nutrition Therapy: Visit start time: 1330  end time: 1430  Assessment:  Diagnosis: obesity Past medical history: sleep apnea, rheumatoid arthritis, DJD Psychosocial issues/ stress concerns: none  Preferred learning method:  . No preference indicated  Current weight: 268.8lbs Height: 5'9" Medications, supplements: reconciled list in medical record  Progress and evaluation:   Patient reports she had weight loss surgery about 6 years ago, sleeve gastrectomy; has regained about 30lbs in more recent years. Weight has recently been flucuating per patient, depending on how well she is managing eating habits.   She reports eating out more, eating more junk food recently, and has had difficulty maintaining healthier eating habits for a long period of time.   Works 12 hour shifts, tired after work, then gets [employer].     Will do some protien shakes for a while, but stops due to cost.  Physical activity: limited due to back pain  Dietary Intake:  Usual eating pattern includes 3 meals and 0-1 snacks per day. Dining out frequency: 3-5 meals per week.  Breakfast: yogurt and grapes or banana Snack: none Lunch: tuna, crackers/ healthy choice or lean cuisine meal/ peanut butter crackers Snack: none Supper: takeout food ie [employer], [employer], or [employer] (usually very hungry) Snack: sometimes grapes; cheese and crackers; cheese; yogurt Beverages: water 2-3 bottles daily, diet soda, occasional juice/ coffee/ tea, rarely regular soda  Nutrition Care Education: Topics covered:  Basic nutrition: basic food groups, appropriate nutrient balance after bariatric surgery; importance of avoiding nutrient deficiencies; general nutrition guidelines    Weight control: determining reasonable weight loss rate, importance of low sugar and low fat choices portion control; estimated energy needs for weight loss at 1200-1500 kcal; advised inclusion of lean protein source + low-carb vegetables in  generous portion, and whole grain/ starch in small portions; discussed role of physical activity and working on gradual increase to avoid/ limit back pain. Advanced nutrition: dining out; planning ahead for balanced meals; options for making low-sugar/ high protein smoothies at home   Nutritional Diagnosis:  [place]-3.3 Overweight/obesity As related to excess calories and limited physical activity due to pain from degenerative disc disease.  As evidenced by patient with current BMI of 39.69, and history of bariatric surgery.  Intervention:  . Instruction and discussion as noted above. . Patient voices readiness to make changes to lose weight and improve health risk and back pain. . Discussed option of keeping food diary to increase mindful eating. . Established goals for change with direction from patient.   Education Materials given:  . Plate Planner with food lists, sample meal pattern . Carb-mindful Smoothie Recipe . Bariatric sample menus for 1200, 1500kcal (AND) . Rebounding from Relapse (AND) . Visit summary with goals/ instructions   Learner/ who was taught:  . Patient   Level of understanding: Marland Kitchen Verbalizes/ demonstrates competency   Demonstrated degree of understanding via:   Teach back Learning barriers: . None  Willingness to learn/ readiness for change: . Acceptance, ready for change   Monitoring and Evaluation:  Dietary intake, exercise, and body weight      follow up: 10/09/20 at 1:30pm

## 2020-09-03 NOTE — Patient Instructions (Signed)
   Make one meal a protein shake or a homemade low-carb smoothie.   Control portions of starchy foods like crackers -- try some 100% whole grain versions or lower-carb like rice cakes  Ideally choose foods that are under 10grams of sugar per serving. If you eat cereal, choose a low sugar type with 3 or more grams of fiber per serving. Add protein such as nuts or boiled eggs.   Eat more meals at home vs takeout foods. This means planning ahead to have what you need at home to make a balanced meal. Example: pre-cooked frozen chicken or fish, add to skillet with a little oil and some frozen vegetables. Then add a small portion of quick rice or pasta or bread.

## 2020-09-12 ENCOUNTER — Other Ambulatory Visit: Payer: Self-pay | Admitting: Student in an Organized Health Care Education/Training Program

## 2020-09-23 ENCOUNTER — Other Ambulatory Visit: Payer: Self-pay | Admitting: Nurse Practitioner

## 2020-09-23 NOTE — Telephone Encounter (Signed)
Requested Prescriptions  Pending Prescriptions Disp Refills  . acyclovir (ZOVIRAX) 400 MG tablet [Pharmacy Med Name: ACYCLOVIR 400 MG TABLET] 180 tablet 0    Sig: TAKE 1 TABLET BY MOUTH 2 TIMES DAILY AS NEEDED.     Antimicrobials:  Antiviral Agents - Anti-Herpetic Passed - 09/23/2020  1:27 AM      Passed - Valid encounter within last 12 months    Recent Outpatient Visits          2 months ago Class 3 severe obesity due to excess calories without serious comorbidity with body mass index (BMI) of 40.0 to 44.9 in adult [facility])   [place], Jolene T, NP   3 months ago Class 3 severe obesity due to excess calories without serious comorbidity with body mass index (BMI) of 40.0 to 44.9 in adult [facility])   [place], Barbaraann Faster, NP   5 months ago Flu vaccine need   [facility] Kathrine Haddock, NP   6 months ago Rheumatoid arthritis involving multiple sites with positive rheumatoid factor ([place])   [place], Henrine Screws T, NP   7 months ago Vaginal itching   [facility] Eulogio Bear, NP

## 2020-10-02 ENCOUNTER — Other Ambulatory Visit: Payer: Self-pay

## 2020-10-02 ENCOUNTER — Ambulatory Visit
Payer: BC Managed Care – PPO | Attending: Student in an Organized Health Care Education/Training Program | Admitting: Student in an Organized Health Care Education/Training Program

## 2020-10-02 ENCOUNTER — Encounter: Payer: Self-pay | Admitting: Student in an Organized Health Care Education/Training Program

## 2020-10-02 VITALS — BP 123/76 | HR 82 | Temp 97.2°F | Resp 16 | Ht 69.0 in | Wt 270.0 lb

## 2020-10-02 DIAGNOSIS — G5701 Lesion of sciatic nerve, right lower limb: Secondary | ICD-10-CM | POA: Diagnosis not present

## 2020-10-02 DIAGNOSIS — M47818 Spondylosis without myelopathy or radiculopathy, sacral and sacrococcygeal region: Secondary | ICD-10-CM | POA: Diagnosis not present

## 2020-10-02 DIAGNOSIS — G894 Chronic pain syndrome: Secondary | ICD-10-CM | POA: Diagnosis not present

## 2020-10-02 DIAGNOSIS — M533 Sacrococcygeal disorders, not elsewhere classified: Secondary | ICD-10-CM | POA: Insufficient documentation

## 2020-10-02 MED ORDER — PREGABALIN 75 MG PO CAPS
75.0000 mg | ORAL_CAPSULE | Freq: Two times a day (BID) | ORAL | 1 refills | Status: DC
Start: 2020-10-02 — End: 2020-12-25

## 2020-10-02 NOTE — Patient Instructions (Addendum)
GENERAL RISKS AND COMPLICATIONS  What are the risk, side effects and possible complications? Generally speaking, most procedures are safe.  However, with any procedure there are risks, side effects, and the possibility of complications.  The risks and complications are dependent upon the sites that are lesioned, or the type of nerve block to be performed.  The closer the procedure is to the spine, the more serious the risks are.  Great care is taken when placing the radio frequency needles, block needles or lesioning probes, but sometimes complications can occur. 1. Infection: Any time there is an injection through the skin, there is a risk of infection.  This is why sterile conditions are used for these blocks.  There are four possible types of infection. 1. Localized skin infection. 2. Central Nervous System Infection-This can be in the form of Meningitis, which can be deadly. 3. Epidural Infections-This can be in the form of an epidural abscess, which can cause pressure inside of the spine, causing compression of the spinal cord with subsequent paralysis. This would require an emergency surgery to decompress, and there are no guarantees that the patient would recover from the paralysis. 4. Discitis-This is an infection of the intervertebral discs.  It occurs in about 1% of discography procedures.  It is difficult to treat and it may lead to surgery.        2. Pain: the needles have to go through skin and soft tissues, will cause soreness.       3. Damage to internal structures:  The nerves to be lesioned may be near blood vessels or    other nerves which can be potentially damaged.       4. Bleeding: Bleeding is more common if the patient is taking blood thinners such as  aspirin, Coumadin, Ticiid, Plavix, etc., or if he/she have some genetic predisposition  such as hemophilia. Bleeding into the spinal canal can cause compression of the spinal  cord with subsequent paralysis.  This would require an  emergency surgery to  decompress and there are no guarantees that the patient would recover from the  paralysis.       5. Pneumothorax:  Puncturing of a lung is a possibility, every time a needle is introduced in  the area of the chest or upper back.  Pneumothorax refers to free air around the  collapsed lung(s), inside of the thoracic cavity (chest cavity).  Another two possible  complications related to a similar event would include: Hemothorax and Chylothorax.   These are variations of the Pneumothorax, where instead of air around the collapsed  lung(s), you may have blood or chyle, respectively.       6. Spinal headaches: They may occur with any procedures in the area of the spine.       7. Persistent CSF (Cerebro-Spinal Fluid) leakage: This is a rare problem, but may occur  with prolonged intrathecal or epidural catheters either due to the formation of a fistulous  track or a dural tear.       8. Nerve damage: By working so close to the spinal cord, there is always a possibility of  nerve damage, which could be as serious as a permanent spinal cord injury with  paralysis.       9. Death:  Although rare, severe deadly allergic reactions known as "Anaphylactic  reaction" can occur to any of the medications used.      10. Worsening of the symptoms:  We can always make thing worse.    What are the chances of something like this happening? Chances of any of this occuring are extremely low.  By statistics, you have more of a chance of getting killed in a motor vehicle accident: while driving to the hospital than any of the above occurring .  Nevertheless, you should be aware that they are possibilities.  In general, it is similar to taking a shower.  Everybody knows that you can slip, hit your head and get killed.  Does that mean that you should not shower again?  Nevertheless always keep in mind that statistics do not mean anything if you happen to be on the wrong side of them.  Even if a procedure has a 1  (one) in a 1,000,000 (million) chance of going wrong, it you happen to be that one..Also, keep in mind that by statistics, you have more of a chance of having something go wrong when taking medications.  Who should not have this procedure? If you are on a blood thinning medication (e.g. Coumadin, Plavix, see list of "Blood Thinners"), or if you have an active infection going on, you should not have the procedure.  If you are taking any blood thinners, please inform your physician.  How should I prepare for this procedure?  Do not eat or drink anything at least six hours prior to the procedure.  Bring a driver with you .  It cannot be a taxi.  Come accompanied by an adult that can drive you back, and that is strong enough to help you if your legs get weak or numb from the local anesthetic.  Take all of your medicines the morning of the procedure with just enough water to swallow them.  If you have diabetes, make sure that you are scheduled to have your procedure done first thing in the morning, whenever possible.  If you have diabetes, take only half of your insulin dose and notify our nurse that you have done so as soon as you arrive at the clinic.  If you are diabetic, but only take blood sugar pills (oral hypoglycemic), then do not take them on the morning of your procedure.  You may take them after you have had the procedure.  Do not take aspirin or any aspirin-containing medications, at least eleven (11) days prior to the procedure.  They may prolong bleeding.  Wear loose fitting clothing that may be easy to take off and that you would not mind if it got stained with Betadine or blood.  Do not wear any jewelry or perfume  Remove any nail coloring.  It will interfere with some of our monitoring equipment.  NOTE: Remember that this is not meant to be interpreted as a complete list of all possible complications.  Unforeseen problems may occur.  BLOOD THINNERS The following drugs  contain aspirin or other products, which can cause increased bleeding during surgery and should not be taken for 2 weeks prior to and 1 week after surgery.  If you should need take something for relief of minor pain, you may take acetaminophen which is found in Tylenol,m Datril, Anacin-3 and Panadol. It is not blood thinner. The products listed below are.  Do not take any of the products listed below in addition to any listed on your instruction sheet.  A.P.C or A.P.C with Codeine Codeine Phosphate Capsules #3 Ibuprofen Ridaura  ABC compound Congesprin Imuran rimadil  Advil Cope Indocin Robaxisal  Alka-Seltzer Effervescent Pain Reliever and Antacid Coricidin or Coricidin-D  Indomethacin Rufen    Alka-Seltzer plus Cold Medicine Cosprin Ketoprofen S-A-C Tablets  Anacin Analgesic Tablets or Capsules Coumadin Korlgesic Salflex  Anacin Extra Strength Analgesic tablets or capsules CP-2 Tablets Lanoril Salicylate  Anaprox Cuprimine Capsules Levenox Salocol  Anexsia-D Dalteparin Magan Salsalate  Anodynos Darvon compound Magnesium Salicylate Sine-off  Ansaid Dasin Capsules Magsal Sodium Salicylate  Anturane Depen Capsules Marnal Soma  APF Arthritis pain formula Dewitt's Pills Measurin Stanback  Argesic Dia-Gesic Meclofenamic Sulfinpyrazone  Arthritis Bayer Timed Release Aspirin Diclofenac Meclomen Sulindac  Arthritis pain formula Anacin Dicumarol Medipren Supac  Analgesic (Safety coated) Arthralgen Diffunasal Mefanamic Suprofen  Arthritis Strength Bufferin Dihydrocodeine Mepro Compound Suprol  Arthropan liquid Dopirydamole Methcarbomol with Aspirin Synalgos  ASA tablets/Enseals Disalcid Micrainin Tagament  Ascriptin Doan's Midol Talwin  Ascriptin A/D Dolene Mobidin Tanderil  Ascriptin Extra Strength Dolobid Moblgesic Ticlid  Ascriptin with Codeine Doloprin or Doloprin with Codeine Momentum Tolectin  Asperbuf Duoprin Mono-gesic Trendar  Aspergum Duradyne Motrin or Motrin IB Triminicin  Aspirin  plain, buffered or enteric coated Durasal Myochrisine Trigesic  Aspirin Suppositories Easprin Nalfon Trillsate  Aspirin with Codeine Ecotrin Regular or Extra Strength Naprosyn Uracel  Atromid-S Efficin Naproxen Ursinus  Auranofin Capsules Elmiron Neocylate Vanquish  Axotal Emagrin Norgesic Verin  Azathioprine Empirin or Empirin with Codeine Normiflo Vitamin E  Azolid Emprazil Nuprin Voltaren  Bayer Aspirin plain, buffered or children's or timed BC Tablets or powders Encaprin Orgaran Warfarin Sodium  Buff-a-Comp Enoxaparin Orudis Zorpin  Buff-a-Comp with Codeine Equegesic Os-Cal-Gesic   Buffaprin Excedrin plain, buffered or Extra Strength Oxalid   Bufferin Arthritis Strength Feldene Oxphenbutazone   Bufferin plain or Extra Strength Feldene Capsules Oxycodone with Aspirin   Bufferin with Codeine Fenoprofen Fenoprofen Pabalate or Pabalate-SF   Buffets II Flogesic Panagesic   Buffinol plain or Extra Strength Florinal or Florinal with Codeine Panwarfarin   Buf-Tabs Flurbiprofen Penicillamine   Butalbital Compound Four-way cold tablets Penicillin   Butazolidin Fragmin Pepto-Bismol   Carbenicillin Geminisyn Percodan   Carna Arthritis Reliever Geopen Persantine   Carprofen Gold's salt Persistin   Chloramphenicol Goody's Phenylbutazone   Chloromycetin Haltrain Piroxlcam   Clmetidine heparin Plaquenil   Cllnoril Hyco-pap Ponstel   Clofibrate Hydroxy chloroquine Propoxyphen         Before stopping any of these medications, be sure to consult the physician who ordered them.  Some, such as Coumadin (Warfarin) are ordered to prevent or treat serious conditions such as "deep thrombosis", "pumonary embolisms", and other heart problems.  The amount of time that you may need off of the medication may also vary with the medication and the reason for which you were taking it.  If you are taking any of these medications, please make sure you notify your pain physician before you undergo any  procedures.         Moderate Conscious Sedation, Adult Sedation is the use of medicines to promote relaxation and to relieve discomfort and anxiety. Moderate conscious sedation is a type of sedation. Under moderate conscious sedation, you are less alert than normal, but you are still able to respond to instructions, touch, or both. Moderate conscious sedation is used during short medical and dental procedures. It is milder than deep sedation, which is a type of sedation under which you cannot be easily woken up. It is also milder than general anesthesia, which is the use of medicines to make you unconscious. Moderate conscious sedation allows you to return to your regular activities sooner. Tell a health care provider about:  Any allergies you have.  All medicines you  are taking, including vitamins, herbs, eye drops, creams, and over-the-counter medicines.  Any use of steroids. This includes steroids taken by mouth or as a cream.  Any problems you or family members have had with sedatives and anesthetic medicines.  Any blood disorders you have.  Any surgeries you have had.  Any medical conditions you have, such as sleep apnea.  Whether you are pregnant or may be pregnant.  Any use of cigarettes, alcohol, marijuana, or drugs. What are the risks? Generally, this is a safe procedure. However, problems may occur, including:  Getting too much medicine (oversedation).  Nausea.  Allergic reaction to medicines.  Trouble breathing. If this happens, a breathing tube may be used. It will be removed when you are awake and breathing on your own.  Heart trouble.  Lung trouble.  Confusion that gets better with time (emergence delirium). What happens before the procedure? Staying hydrated Follow instructions from your health care provider about hydration, which may include:  Up to 2 hours before the procedure - you may continue to drink clear liquids, such as water, clear fruit  juice, black coffee, and plain tea. Eating and drinking restrictions Follow instructions from your health care provider about eating and drinking, which may include:  8 hours before the procedure - stop eating heavy meals or foods, such as meat, fried foods, or fatty foods.  6 hours before the procedure - stop eating light meals or foods, such as toast or cereal.  6 hours before the procedure - stop drinking milk or drinks that contain milk.  2 hours before the procedure - stop drinking clear liquids. Medicines Ask your health care provider about:  Changing or stopping your regular medicines. This is especially important if you are taking diabetes medicines or blood thinners.  Taking medicines such as aspirin and ibuprofen. These medicines can thin your blood. Do not take these medicines unless your health care provider tells you to take them.  Taking over-the-counter medicines, vitamins, herbs, and supplements. Tests and exams  You will have a physical exam.  You may have blood tests done to show how well: ? Your kidneys and liver work. ? Your blood clots. General instructions  Plan to have a responsible adult take you home from the hospital or clinic.  If you will be going home right after the procedure, plan to have a responsible adult care for you for the time you are told. This is important. What happens during the procedure?  You will be given the sedative. The sedative may be given: ? As a pill that you will swallow. It can also be inserted into the rectum. ? As a spray through the nose. ? As an injection into the muscle. ? As an injection into the vein through an IV.  You may be given oxygen as needed.  Your breathing, heart rate, and blood pressure will be monitored during the procedure.  The medical or dental procedure will be done. The procedure may vary among health care providers and hospitals.   What happens after the procedure?  Your blood pressure, heart  rate, breathing rate, and blood oxygen level will be monitored until you leave the hospital or clinic.  You will get fluids through your IV if needed.  Do not drive or operate machinery until your health care provider says that it is safe. Summary  Sedation is the use of medicines to promote relaxation and to relieve discomfort and anxiety. Moderate conscious sedation is a type of sedation that  is used during short medical and dental procedures.  Tell the health care provider about any medical conditions that you have and about all the medicines that you are taking.  You will be given the sedative as a pill, a spray through the nose, an injection into the muscle, or an injection into the vein through an IV. Vital signs are monitored during the sedation.  Moderate conscious sedation allows you to return to your regular activities sooner. This information is not intended to replace advice given to you by your health care provider. Make sure you discuss any questions you have with your health care provider. Document Revised: 11/16/2019 Document Reviewed: 06/14/2019 Elsevier Patient Education  2021 [place].   Trigger Point Injection Trigger points are areas where you have pain. A trigger point injection is a shot given in the trigger point to help relieve pain for a few days to a few months. Common places for trigger points include:  The neck.  The shoulders.  The upper back.  The lower back. A trigger point injection will not cure long-term (chronic) pain permanently. These injections do not always work for every person. For some people, they can help to relieve pain for a few days to a few months. Tell a health care provider about:  Any allergies you have.  All medicines you are taking, including vitamins, herbs, eye drops, creams, and over-the-counter medicines.  Any problems you or family members have had with anesthetic medicines.  Any blood disorders you have.  Any  surgeries you have had.  Any medical conditions you have. What are the risks? Generally, this is a safe procedure. However, problems may occur, including:  Infection.  Bleeding or bruising.  Allergic reaction to the injected medicine.  Irritation of the skin around the injection site. What happens before the procedure? Ask your health care provider about:  Changing or stopping your regular medicines. This is especially important if you are taking diabetes medicines or blood thinners.  Taking medicines such as aspirin and ibuprofen. These medicines can thin your blood. Do not take these medicines unless your health care provider tells you to take them.  Taking over-the-counter medicines, vitamins, herbs, and supplements. What happens during the procedure?  Your health care provider will feel for trigger points. A marker may be used to circle the area for the injection.  The skin over the trigger point will be washed with a germ-killing (antiseptic) solution.  A thin needle is used for the injection. You may feel pain or a twitching feeling when the needle enters the trigger point.  A numbing solution may be injected into the trigger point. Sometimes a medicine to keep down inflammation is also injected.  Your health care provider may move the needle around the area where the trigger point is located until the tightness and twitching goes away.  After the injection, your health care provider may put gentle pressure over the injection site.  The injection site will be covered with a bandage (dressing). The procedure may vary among health care providers and hospitals.   What can I expect after treatment? After treatment, you may have:  Soreness and stiffness for 1-2 days.  A dressing. This can be taken off in a few hours or as told by your health care provider. Follow these instructions at home: Injection site care  Remove your dressing as told by your health care  provider.  Check your injection site every day for signs of infection. Check for: ?  Redness, swelling, or pain. ? Fluid or blood. ? Warmth. ? Pus or a bad smell. Managing pain, stiffness, and swelling  If directed, put ice on the affected area. ? Put ice in a plastic bag. ? Place a towel between your skin and the bag. ? Leave the ice on for 20 minutes, 2-3 times a day. General instructions  If you were asked to stop your regular medicines, ask your health care provider when you may start taking them again.  Return to your normal activities as told by your health care provider. Ask your health care provider what activities are safe for you.  Do not take baths, swim, or use a hot tub until your health care provider approves.  You may be asked to see an occupational or physical therapist for exercises that reduce muscle strain and stretch the area of the trigger point.  Keep all follow-up visits as told by your health care provider. This is important. Contact a health care provider if:  Your pain comes back, and it is worse than before the injection. You may need more injections.  You have chills or a fever.  The injection site becomes more painful, red, swollen, or warm to the touch. Summary  A trigger point injection is a shot given in the trigger point to help relieve pain for a few days to a few months.  Common places for trigger point injections are the neck, shoulder, upper back, and lower back.  These injections do not always work for every person, but for some people, the injections can help to relieve pain for a few days to a few months.  Contact a health care provider if symptoms come back or they are worse than before treatment. Also, get help if the injection site becomes more painful, red, swollen, or warm to the touch. This information is not intended to replace advice given to you by your health care provider. Make sure you discuss any questions you have with your  health care provider. Document Revised: 08/30/2018 Document Reviewed: 08/30/2018 Elsevier Patient Education  2021 [place].   Sacroiliac (SI) Joint Injection Patient Information  Description: The sacroiliac joint connects the scrum (very low back and tailbone) to the ilium (a pelvic bone which also forms half of the hip joint).  Normally this joint experiences very little motion.  When this joint becomes inflamed or unstable low back and or hip and pelvis pain may result.  Injection of this joint with local anesthetics (numbing medicines) and steroids can provide diagnostic information and reduce pain.  This injection is performed with the aid of x-ray guidance into the tailbone area while you are lying on your stomach.   You may experience an electrical sensation down the leg while this is being done.  You may also experience numbness.  We also may ask if we are reproducing your normal pain during the injection.  Conditions which may be treated SI injection:   Low back, buttock, hip or leg pain  Preparation for the Injection:  7. Do not eat any solid food or dairy products within 8 hours of your appointment.  8. You may drink clear liquids up to 3 hours before appointment.  Clear liquids include water, black coffee, juice or soda.  No milk or cream please. 9. You may take your regular medications, including pain medications with a sip of water before your appointment.  Diabetics should hold regular insulin (if take separately) and take 1/2 normal NPH dose the morning  of the procedure.  Carry some sugar containing items with you to your appointment. 10. A driver must accompany you and be prepared to drive you home after your procedure. 11. Bring all of your current medications with you. 12. An IV may be inserted and sedation may be given at the discretion of the physician. 13. A blood pressure cuff, EKG and other monitors will often be applied during the procedure.  Some patients may  need to have extra oxygen administered for a short period.  22. You will be asked to provide medical information, including your allergies, prior to the procedure.  We must know immediately if you are taking blood thinners (like Coumadin/Warfarin) or if you are allergic to IV iodine contrast (dye).  We must know if you could possible be pregnant.  Possible side effects:   Bleeding from needle site  Infection (rare, may require surgery)  Nerve injury (rare)  Numbness & tingling (temporary)  A brief convulsion or seizure  Light-headedness (temporary)  Pain at injection site (several days)  Decreased blood pressure (temporary)  Weakness in the leg (temporary)   Call if you experience:   New onset weakness or numbness of an extremity below the injection site that last more than 8 hours.  Hives or difficulty breathing ( go to the emergency room)  Inflammation or drainage at the injection site  Any new symptoms which are concerning to you  Please note:  Although the local anesthetic injected can often make your back/ hip/ buttock/ leg feel good for several hours after the injections, the pain will likely return.  It takes 3-7 days for steroids to work in the sacroiliac area.  You may not notice any pain relief for at least that one week.  If effective, we will often do a series of three injections spaced 3-6 weeks apart to maximally decrease your pain.  After the initial series, we generally will wait some months before a repeat injection of the same type.  If you have any questions, please call [phone] [facility]

## 2020-10-02 NOTE — Progress Notes (Signed)
PROVIDER NOTE: Information contained herein reflects review and annotations entered in association with encounter. Interpretation of such information and data should be left to medically-trained personnel. Information provided to patient can be located elsewhere in the medical record under "Patient Instructions". Document created using STT-dictation technology, any transcriptional errors that may result from process are unintentional.    Patient: Erin Contreras  Service Category: E/M  Provider: Gillis Santa, MD  DOB: June 20, 1961  DOS: 10/02/2020  Specialty: Interventional Pain Management  MRN: [id number]  Setting: Ambulatory outpatient  PCP: Venita Lick, NP  Type: Established Patient    Referring Provider: Venita Lick, NP  Location: Office  Delivery: Face-to-face     HPI  [name], a 60 y.o. year old female, is here today because of her SI joint arthritis [M47.818]. Ms. Reichard primary complain today is Back Pain (lower) Last encounter: My last encounter with her was on 09/12/2020. Pertinent problems: Ms. Koenig has OA (osteoarthritis) of knee; Obesity; Chronic radicular lumbar pain; Lumbar spondylosis; Lumbar degenerative disc disease; Sacroiliac joint pain; SI joint arthritis; and Chronic pain syndrome on their pertinent problem list. Pain Assessment: Severity of Chronic pain is reported as a 8 /10. Location: Back Lower/Right hip to buttock down to ankle. Onset: More than a month ago. Quality: Contraction,Sharp,Aching. Timing: Constant. Modifying factor(s): sitting. Vitals:  height is $RemoveB'5\' 9"'tUNSmtkM$  (1.753 m) and weight is 270 lb (122.5 kg). Her temporal temperature is 97.2 F (36.2 C) (abnormal). Her blood pressure is 123/76 and her pulse is 82. Her respiration is 16 and oxygen saturation is 99%.   Reason for encounter: worsening of previously known (established) problem    Increased right low back, right buttock, right SI joint pain with occasional radiation to her groin.  Patient did  not respond to her right L5-S1 lumbar epidural steroid injection that was done last year.  It is possible that she has SI joint dysfunction and piriformis syndrome according to the description of her pain and how it radiates.  I clarified how Lyrica should be taken patient states that she will restart.  Otherwise plan for right SI joint injection, right piriformis injection for SI joint dysfunction, piriformis syndrome.   ROS  Constitutional: Denies any fever or chills Gastrointestinal: No reported hemesis, hematochezia, vomiting, or acute GI distress Musculoskeletal: Right low back, right buttock, right hip, right sacroiliac joint pain Neurological: No reported episodes of acute onset apraxia, aphasia, dysarthria, agnosia, amnesia, paralysis, loss of coordination, or loss of consciousness  Medication Review  DULoxetine, Vitamin B-12, Vitamin D3, acyclovir, albuterol, etanercept, folic acid, methotrexate, and pregabalin  History Review  Allergy: Ms. Dicola has No Known Allergies. Drug: Ms. Prichett  reports no history of drug use. Alcohol:  reports no history of alcohol use. Tobacco:  reports that she has never smoked. She has never used smokeless tobacco. Social: Ms. Lindvall  reports that she has never smoked. She has never used smokeless tobacco. She reports that she does not drink alcohol and does not use drugs. Medical:  has a past medical history of Boils, Ear mass, Hemorrhoids, Herpes simplex virus infection, HSV infection, Lumbago with sciatica, OA (osteoarthritis) of knee, Obesity, Overactive bladder, PONV (postoperative nausea and vomiting), RA (rheumatoid arthritis) ([place]), Sleep apnea, and Urge and stress incontinence. Surgical: Ms. Bettendorf  has a past surgical history that includes Cholecystectomy (2011); Tumor removal (Right, 2010); Sleeve Gastroplasty; Fracture surgery (Right); and Knee Arthroplasty (Right, 07/11/2017). Family: family history includes Cancer in her mother; Cirrhosis in  her  father; Diabetes in her mother, paternal grandfather, and paternal grandmother; Heart disease in her paternal grandfather and paternal grandmother; Hyperlipidemia in her mother; Hypertension in her daughter and mother; Stroke in her maternal grandfather and maternal grandmother.  Laboratory Chemistry Profile   Renal Lab Results  Component Value Date   BUN 13 06/12/2020   CREATININE 0.74 06/12/2020   BCR 18 06/12/2020   GFRAA 102 06/12/2020   GFRNONAA 88 06/12/2020     Hepatic Lab Results  Component Value Date   AST 18 06/12/2020   ALT 21 06/12/2020   ALBUMIN 4.0 06/12/2020   ALKPHOS 92 06/12/2020   LIPASE 106 05/10/2014     Electrolytes Lab Results  Component Value Date   NA 143 06/12/2020   K 4.4 06/12/2020   CL 105 06/12/2020   CALCIUM 9.2 06/12/2020   MG 2.0 09/04/2014   PHOS 3.7 09/04/2014     Bone Lab Results  Component Value Date   VD25OH 30.9 05/15/2020     Inflammation (CRP: Acute Phase) (ESR: Chronic Phase) Lab Results  Component Value Date   CRP <1 03/17/2018   ESRSEDRATE 19 03/17/2018       Note: Above Lab results reviewed.  Recent Imaging Review  MM 3D SCREEN BREAST BILATERAL CLINICAL DATA:  Screening.  EXAM: DIGITAL SCREENING BILATERAL MAMMOGRAM WITH TOMO AND CAD  COMPARISON:  Previous exam(s).  ACR Breast Density Category a: The breast tissue is almost entirely fatty.  FINDINGS: There are no findings suspicious for malignancy. Images were processed with CAD.  IMPRESSION: No mammographic evidence of malignancy. A result letter of this screening mammogram will be mailed directly to the patient.  RECOMMENDATION: Screening mammogram in one year. (Code:SM-B-01Y)  BI-RADS CATEGORY  1: Negative.  Electronically Signed   By: Kristopher Oppenheim M.D.   On: 07/04/2020 08:56 Note: Reviewed        Physical Exam  General appearance: Well nourished, well developed, and well hydrated. In no apparent acute distress Mental status: Alert,  oriented x 3 (person, place, & time)       Respiratory: No evidence of acute respiratory distress Eyes: PERLA Vitals: BP 123/76   Pulse 82   Temp (!) 97.2 F (36.2 C) (Temporal)   Resp 16   Ht $R'5\' 9"'lJ$  (1.753 m)   Wt 270 lb (122.5 kg)   SpO2 99%   BMI 39.87 kg/m  BMI: Estimated body mass index is 39.87 kg/m as calculated from the following:   Height as of this encounter: $RemoveBeforeD'5\' 9"'bBMMjxfHkniyAB$  (1.753 m).   Weight as of this encounter: 270 lb (122.5 kg). Ideal: Ideal body weight: 66.2 kg (145 lb 15.1 oz) Adjusted ideal body weight: 88.7 kg (195 lb 9.1 oz)  Lumbar Spine Area Exam  Skin & Axial Inspection: No masses, redness, or swelling Alignment: Symmetrical Functional ROM: Pain restricted ROM affecting primarily the right Stability: No instability detected Muscle Tone/Strength: Functionally intact. No obvious neuro-muscular anomalies detected. Sensory (Neurological): Musculoskeletal pain pattern  Provocative Tests:  Patrick's Maneuver: (+) for right-sided S-I arthralgia             FABER* test: (+) for right-sided S-I arthralgia             S-I anterior distraction/compression test: (+) Right-sided S-I arthralgia/arthropathy S-I lateral compression test: (+) Right-sided S-I arthralgia/arthropathy  Gait & Posture Assessment  Ambulation: Limited Gait: Antalgic Posture: Difficulty standing up straight, due to pain  Lower Extremity Exam    Side: Right lower extremity  Side: Left lower extremity  Stability: No instability observed          Stability: No instability observed          Skin & Extremity Inspection: Skin color, temperature, and hair growth are WNL. No peripheral edema or cyanosis. No masses, redness, swelling, asymmetry, or associated skin lesions. No contractures.  Skin & Extremity Inspection: Skin color, temperature, and hair growth are WNL. No peripheral edema or cyanosis. No masses, redness, swelling, asymmetry, or associated skin lesions. No contractures.  Functional ROM: Pain  restricted ROM for hip and knee joints          Functional ROM: Unrestricted ROM                  Muscle Tone/Strength: Functionally intact. No obvious neuro-muscular anomalies detected.  Muscle Tone/Strength: Functionally intact. No obvious neuro-muscular anomalies detected.  Sensory (Neurological): Arthropathic arthralgia        Sensory (Neurological): Unimpaired        DTR: Patellar: deferred today Achilles: deferred today Plantar: deferred today  DTR: Patellar: deferred today Achilles: deferred today Plantar: deferred today  Palpation: No palpable anomalies  Palpation: No palpable anomalies    Assessment   Status Diagnosis  Having a Flare-up Having a Flare-up Having a Flare-up 1. SI joint arthritis   2. Piriformis syndrome of right side   3. Sacroiliac joint pain   4. Chronic pain syndrome      Updated Problems: Problem  Piriformis Syndrome of Right Side    Plan of Care  [name] has a current medication list which includes the following long-term medication(s): albuterol, duloxetine, etanercept, and pregabalin.  Pharmacotherapy (Medications Ordered): Meds ordered this encounter  Medications  . pregabalin (LYRICA) 75 MG capsule    Sig: Take 1 capsule (75 mg total) by mouth 2 (two) times daily.    Dispense:  60 capsule    Refill:  1    Fill one day early if pharmacy is closed on scheduled refill date. May substitute for generic if available.   Orders:  Orders Placed This Encounter  Procedures  . SACROILIAC JOINT INJECTION    Standing Status:   Future    Standing Expiration Date:   11/02/2020    Scheduling Instructions:     Side: RIGHT     Sedation: with.     Timeframe: ASAP    Order Specific Question:   Where will this procedure be performed?    Answer:   [facility]  . TRIGGER POINT INJECTION    Area: Buttocks region (gluteal area) Indications: Piriformis muscle pain; Right (G57.01) piriformis-syndrome; piriformis muscle spasms  (H70.263). CPT code: 20552    Order Specific Question:   Where will this procedure be performed?    Answer:   [facility]   Follow-up plan:   Return in about 2 weeks (around 10/16/2020) for R SI-J + piriformis TPI , with sedation.     Right L5-S1 ESI 05/28/2020 only helped for 1 week, return of pain.  Consider diagnostic lumbar facet medial branch nerve blocks in future.      Recent Visits Date Type Provider Dept  08/21/20 Office Visit Gillis Santa, MD [facility]  07/15/20 Telemedicine Gillis Santa, MD [facility]  Showing recent visits within past 90 days and meeting all other requirements Today's Visits Date Type Provider Dept  10/02/20 Office Visit Gillis Santa, MD [facility]  Showing today's visits and meeting all other requirements Future Appointments Date Type Provider Dept  10/13/20 Appointment Gillis Santa, MD [facility]  11/25/20 Appointment Gillis Santa, MD [facility]  Showing future appointments within next 90 days and meeting all other requirements  I discussed the assessment and treatment plan with the patient. The patient was provided an opportunity to ask questions and all were answered. The patient agreed with the plan and demonstrated an understanding of the instructions.  Patient advised to call back or seek an in-person evaluation if the symptoms or condition worsens.  Duration of encounter: 30 minutes.  Note by: Gillis Santa, MD Date: 10/02/2020; Time: 3:37 PM

## 2020-10-02 NOTE — Progress Notes (Signed)
Safety precautions to be maintained throughout the outpatient stay will include: orient to surroundings, keep bed in low position, maintain call bell within reach at all times, provide assistance with transfer out of bed and ambulation.  

## 2020-10-04 ENCOUNTER — Other Ambulatory Visit: Payer: Self-pay | Admitting: Unknown Physician Specialty

## 2020-10-07 ENCOUNTER — Ambulatory Visit: Payer: Self-pay | Admitting: *Deleted

## 2020-10-07 NOTE — Telephone Encounter (Signed)
Per initial encounter "Patient states right ear is stopped up and feels as if it is popping open. No ear pain, for 1 month but symptoms are getting worth. Seeking clinical advice and OTC recommendation. Patient PCP has no availability today, offered tomorrow and declined due to trying to return back to work tomorrow"; contacted pt regarding her symptoms; she says her symptoms started at least 3-4 weeks ago; she describes her discomfort as her "ear is stopped up" and feels like it needs to pop; the pt says she can hardly hear in her right ear; recommendations made per nurse triage protocol; she verbalized understanding and would like to be seen today or 10/10/20; decision tree completed; there is no availability ; the pt is seen by Marnee Guarneri, [facility]; pt transferred to [facility] for scheduling.  Reason for Disposition . Ear congestion present > 48 hours  Answer Assessment - Initial Assessment Questions 1. LOCATION: "Which ear is involved?"       Right ear 2. SENSATION: "Describe how the ear feels." (e.g. stuffy, full, plugged)."      Feels like ear is stopped up 3. ONSET:  "When did the ear symptoms start?"       At least 1 month ago 4. PAIN: "Do you also have an earache?" If Yes, ask: "How bad is it?" (Scale 1-10; or mild, moderate, severe)    no 5. CAUSE: "What do you think is causing the ear congestion?"     Not sure 6. URI: "Do you have a runny nose or cough?"      Intermittent runny nose 7. NASAL ALLERGIES: "Are there symptoms of hay fever, such as sneezing or a clear nasal discharge?"     Clear nasal discharge 8. PREGNANCY: "Is there any chance you are pregnant?" "When was your last menstrual period?"     no  Protocols used: EAR - CONGESTION-A-AH

## 2020-10-07 NOTE — Telephone Encounter (Signed)
Noted  

## 2020-10-08 ENCOUNTER — Encounter: Payer: Self-pay | Admitting: Nurse Practitioner

## 2020-10-08 ENCOUNTER — Telehealth: Payer: Self-pay | Admitting: Student in an Organized Health Care Education/Training Program

## 2020-10-08 ENCOUNTER — Ambulatory Visit: Payer: BC Managed Care – PPO | Admitting: Nurse Practitioner

## 2020-10-08 ENCOUNTER — Other Ambulatory Visit: Payer: Self-pay

## 2020-10-08 DIAGNOSIS — H6121 Impacted cerumen, right ear: Secondary | ICD-10-CM

## 2020-10-08 NOTE — Telephone Encounter (Signed)
Attempted to call patient again, message left.

## 2020-10-08 NOTE — Telephone Encounter (Signed)
Patient states [facility] hasnt received script for her pain meds. Please advise patient.

## 2020-10-08 NOTE — Assessment & Plan Note (Signed)
Acute and improved after irrigation. Right ear irrigated with luke warm water and moderate amount cerumen removed, used round hook to assist.  Tolerated well without discomfort or dizziness.  Able to view right TM after procedure and reported symptoms improved.  Return to office for worsening or return of symptoms.  Educated on Debrox to use at home and to avoid Q tips.

## 2020-10-08 NOTE — Telephone Encounter (Signed)
Left message asking which medication she is referring to.

## 2020-10-08 NOTE — Progress Notes (Signed)
BP 116/81   Pulse 94   Temp 98.4 F (36.9 C) (Oral)   Wt 271 lb 6.4 oz (123.1 kg)   SpO2 96%   BMI 40.08 kg/m    Subjective:    Patient ID: Erin Contreras, female    DOB: [birth year], 60 y.o.   MRN: [id number]  HPI: Erin Contreras is a 60 y.o. female  Chief Complaint  Patient presents with  . Ear Fullness    Pt states her R ear has felt full and stopped up for the last month. States it is occasionally painful but this comes and goes.    EAR FULLNESS Started 3-4 weeks ago, mainly to right side. Duration: weeks Involved ear(s): right Fever: no Otorrhea: no Upper respiratory infection symptoms: no Pruritus: no Hearing loss: in R ear mild Water immersion no Using Q-tips: no Recurrent otitis media: no Status: worse Treatments attempted: none  Relevant past medical, surgical, family and social history reviewed and updated as indicated. Interim medical history since our last visit reviewed. Allergies and medications reviewed and updated.  Review of Systems  Constitutional: Negative.   HENT: Negative for ear discharge and ear pain.   Respiratory: Negative.   Cardiovascular: Negative.   Neurological: Negative.   Psychiatric/Behavioral: Negative.     Per HPI unless specifically indicated above     Objective:    BP 116/81   Pulse 94   Temp 98.4 F (36.9 C) (Oral)   Wt 271 lb 6.4 oz (123.1 kg)   SpO2 96%   BMI 40.08 kg/m   Wt Readings from Last 3 Encounters:  10/08/20 271 lb 6.4 oz (123.1 kg)  10/02/20 270 lb (122.5 kg)  09/03/20 268 lb 12.8 oz (121.9 kg)    Physical Exam Vitals and nursing note reviewed.  Constitutional:      General: She is awake. She is not in acute distress.    Appearance: She is well-developed and well-groomed. She is obese. She is not ill-appearing.  HENT:     Head: Normocephalic.     Right Ear: Hearing and external ear normal. There is impacted cerumen.     Left Ear: Hearing, tympanic membrane, ear canal and external ear normal.  There is no impacted cerumen.     Ears:     Comments: Right ear irrigated with luke warm water and moderate amount cerumen removed, used round hook to assist.  Tolerated well without discomfort or dizziness.  Able to view right TM after procedure and reported symptoms improved. Eyes:     General: Lids are normal.        Right eye: No discharge.        Left eye: No discharge.     Conjunctiva/sclera: Conjunctivae normal.     Pupils: Pupils are equal, round, and reactive to light.  Neck:     Vascular: No carotid bruit.  Cardiovascular:     Rate and Rhythm: Normal rate and regular rhythm.     Heart sounds: Normal heart sounds. No murmur heard. No gallop.   Pulmonary:     Effort: Pulmonary effort is normal. No accessory muscle usage or respiratory distress.     Breath sounds: Normal breath sounds.  Abdominal:     General: Bowel sounds are normal. There is no distension.     Palpations: Abdomen is soft.     Tenderness: There is no abdominal tenderness.  Musculoskeletal:     Cervical back: Normal range of motion and neck supple.     Right  lower leg: No edema.     Left lower leg: No edema.  Skin:    General: Skin is warm and dry.  Neurological:     Mental Status: She is alert and oriented to person, place, and time.  Psychiatric:        Attention and Perception: Attention normal.        Mood and Affect: Mood normal.        Speech: Speech normal.        Behavior: Behavior normal. Behavior is cooperative.        Thought Content: Thought content normal.    Results for orders placed or performed in visit on 07/24/20  WET PREP FOR [place], YEAST, CLUE   Specimen: Sterile Swab   Sterile Swab  Result Value Ref Range   Trichomonas Exam Negative Negative   Yeast Exam Negative Negative   Clue Cell Exam Positive (A) Negative  GC/Chlamydia Probe Amp   Specimen: Sterile Swab   UR  Result Value Ref Range   Chlamydia trachomatis, NAA Negative Negative   Neisseria Gonorrhoeae by PCR Negative  Negative  Urinalysis, Routine w reflex microscopic  Result Value Ref Range   Specific Gravity, UA >1.030 (H) 1.005 - 1.030   pH, UA 5.0 5.0 - 7.5   Color, UA Yellow Yellow   Appearance Ur Clear Clear   Leukocytes,UA Negative Negative   Protein,UA Negative Negative/Trace   Glucose, UA Negative Negative   Ketones, UA Negative Negative   RBC, UA Negative Negative   Bilirubin, UA Negative Negative   Urobilinogen, Ur 0.2 0.2 - 1.0 mg/dL   Nitrite, UA Negative Negative  HIV Antibody (routine testing w rflx)  Result Value Ref Range   HIV Screen 4th Generation wRfx Non Reactive Non Reactive  RPR  Result Value Ref Range   RPR Ser Ql Non Reactive Non Reactive      Assessment & Plan:   Problem List Items Addressed This Visit      Nervous and Auditory   Impacted cerumen, right ear    Acute and improved after irrigation. Right ear irrigated with luke warm water and moderate amount cerumen removed, used round hook to assist.  Tolerated well without discomfort or dizziness.  Able to view right TM after procedure and reported symptoms improved.  Return to office for worsening or return of symptoms.  Educated on Debrox to use at home and to avoid Q tips.          Follow up plan: Return if symptoms worsen or fail to improve.

## 2020-10-08 NOTE — Patient Instructions (Signed)
Ear Irrigation Ear irrigation is a procedure to wash dirt and wax out of your ear canal. This procedure is also called lavage. You may need ear irrigation if you are having trouble hearing because of a buildup of earwax. You may also have ear irrigation as part of the treatment for an ear infection. Getting wax and dirt out of your ear canal can help ear drops work better. Tell a health care provider about:  Any allergies you have.  All medicines you are taking, including vitamins, herbs, eye drops, creams, and over-the-counter medicines.  Any problems you or family members have had with anesthetic medicines.  Any blood disorders you have.  Any surgeries you have had. This includes any ear surgeries.  Any medical conditions you have.  Whether you are pregnant or may be pregnant. What are the risks? Generally, this is a safe procedure. However, problems may occur, including:  Infection.  Pain.  Hearing loss.  Fluid and debris being pushed through the eardrum and into the middle ear. This can occur if there are holes in the eardrum.  Ear irrigation failing to work. What happens before the procedure?  You will talk with your provider about the procedure and plan.  You may be given ear drops to put in your ear 15-20 minutes before irrigation. This helps loosen the wax. What happens during the procedure?  A syringe is filled with water or saline solution, which is made of salt and water.  The syringe is gently inserted into the ear canal.  The fluid is used to flush out wax and other debris. The procedure may vary among health care providers and hospitals.   What can I expect after the procedure? After an ear irrigation, follow instructions given to you by your health care provider. Follow these instructions at home: Using ear irrigation kits Ear irrigation kits are available for use at home. Ask your health care provider if this is an option for you. In general, you  should:  Use a home irrigation kit only as told by your health care provider.  Read the package instructions carefully.  Follow the directions for using the syringe.  Use water that is room temperature. Do not do ear irrigation at home if you:  Have diabetes. Diabetes increases the risk of infection.  Have a hole or tear in your eardrum.  Have tubes in your ears.  Have had any ear surgery in the past.  Have been told not to irrigate your ears. Cleaning your ears  Clean the outside of your ear with a soft washcloth daily.  If told by your health care provider, use a few drops of baby oil, mineral oil, glycerin, hydrogen peroxide, or over-the-counter earwax softening drops.  Do not use cotton swabs to clean your ears. These can push wax down into the ear canal.  Do not put anything into your ears to try to remove wax. This includes ear candles.   General instructions  Take over-the-counter and prescription medicines only as told by your health care provider.  If you were prescribed an antibiotic medicine, use it as told by your health care provider. Do not stop using the antibiotic even if your condition improves.  Keep the ear clean and dry by following the instructions from your health care provider.  Keep all follow-up visits. This is important.  Visit your health care provider at least once a year to have your ears and hearing checked. Contact a health care provider if:  Your   hearing is not improving or is getting worse.  You have pain or redness in your ear.  You are dizzy.  You have ringing in your ears.  You have nausea or vomiting.  You have fluid, blood, or pus coming out of your ear. Summary  Ear irrigation is a procedure to wash dirt and wax out of your ear canal. This procedure is also called lavage.  To perform ear irrigation, ear drops may be put in your ear 15-20 minutes before irrigation. Water or saline solution will be used to flush out earwax  and other debris.  You may be able to irrigate your ears at home. Ask your health care provider if this is an option for you. Follow your health care provider's instructions.  Clean your ears with a soft cloth after irrigation. Do not use cotton swabs to clean your ears. These can push wax down into the ear canal. This information is not intended to replace advice given to you by your health care provider. Make sure you discuss any questions you have with your health care provider. Document Revised: 11/06/2019 Document Reviewed: 11/06/2019 Elsevier Patient Education  [place].

## 2020-10-09 ENCOUNTER — Ambulatory Visit: Payer: BC Managed Care – PPO | Admitting: Dietician

## 2020-10-13 ENCOUNTER — Ambulatory Visit (HOSPITAL_BASED_OUTPATIENT_CLINIC_OR_DEPARTMENT_OTHER): Payer: BC Managed Care – PPO | Admitting: Student in an Organized Health Care Education/Training Program

## 2020-10-13 ENCOUNTER — Other Ambulatory Visit: Payer: Self-pay

## 2020-10-13 ENCOUNTER — Ambulatory Visit
Admission: RE | Admit: 2020-10-13 | Discharge: 2020-10-13 | Disposition: A | Discharge status: Home / Self Care | Payer: BC Managed Care – PPO | Source: Ambulatory Visit | Attending: Student in an Organized Health Care Education/Training Program | Admitting: Student in an Organized Health Care Education/Training Program

## 2020-10-13 ENCOUNTER — Encounter: Payer: Self-pay | Admitting: Student in an Organized Health Care Education/Training Program

## 2020-10-13 VITALS — BP 122/84 | HR 71 | Temp 97.3°F | Resp 17 | Ht 69.0 in | Wt 271.0 lb

## 2020-10-13 DIAGNOSIS — M533 Sacrococcygeal disorders, not elsewhere classified: Secondary | ICD-10-CM

## 2020-10-13 DIAGNOSIS — M47818 Spondylosis without myelopathy or radiculopathy, sacral and sacrococcygeal region: Secondary | ICD-10-CM | POA: Diagnosis not present

## 2020-10-13 DIAGNOSIS — M461 Sacroiliitis, not elsewhere classified: Secondary | ICD-10-CM | POA: Insufficient documentation

## 2020-10-13 DIAGNOSIS — G894 Chronic pain syndrome: Secondary | ICD-10-CM

## 2020-10-13 DIAGNOSIS — G5701 Lesion of sciatic nerve, right lower limb: Secondary | ICD-10-CM

## 2020-10-13 MED ORDER — IOHEXOL 180 MG/ML  SOLN
10.0000 mL | Freq: Once | INTRAMUSCULAR | Status: AC
  Administered 2020-10-13: 10 mL via INTRA_ARTICULAR

## 2020-10-13 MED ORDER — METHYLPREDNISOLONE ACETATE 40 MG/ML IJ SUSP
INTRAMUSCULAR | Status: AC
  Filled 2020-10-13: qty 1

## 2020-10-13 MED ORDER — FENTANYL CITRATE (PF) 100 MCG/2ML IJ SOLN
25.0000 ug | INTRAMUSCULAR | Status: DC | PRN
  Administered 2020-10-13: 50 ug via INTRAVENOUS
  Filled 2020-10-13: qty 2

## 2020-10-13 MED ORDER — METHYLPREDNISOLONE ACETATE 40 MG/ML IJ SUSP
40.0000 mg | Freq: Once | INTRAMUSCULAR | Status: AC
  Administered 2020-10-13: 40 mg via INTRA_ARTICULAR

## 2020-10-13 MED ORDER — DEXAMETHASONE SODIUM PHOSPHATE 10 MG/ML IJ SOLN
INTRAMUSCULAR | Status: AC
  Filled 2020-10-13: qty 1

## 2020-10-13 MED ORDER — FENTANYL CITRATE (PF) 100 MCG/2ML IJ SOLN
INTRAMUSCULAR | Status: AC
  Filled 2020-10-13: qty 2

## 2020-10-13 MED ORDER — DEXAMETHASONE SODIUM PHOSPHATE 10 MG/ML IJ SOLN
10.0000 mg | Freq: Once | INTRAMUSCULAR | Status: AC
  Administered 2020-10-13: 10 mg

## 2020-10-13 MED ORDER — IOHEXOL 180 MG/ML  SOLN
INTRAMUSCULAR | Status: AC
  Filled 2020-10-13: qty 20

## 2020-10-13 MED ORDER — ROPIVACAINE HCL 2 MG/ML IJ SOLN
9.0000 mL | Freq: Once | INTRAMUSCULAR | Status: AC
  Administered 2020-10-13: 10 mL via PERINEURAL

## 2020-10-13 MED ORDER — LIDOCAINE HCL 2 % IJ SOLN
INTRAMUSCULAR | Status: AC
  Filled 2020-10-13: qty 20

## 2020-10-13 MED ORDER — ROPIVACAINE HCL 2 MG/ML IJ SOLN
INTRAMUSCULAR | Status: AC
  Filled 2020-10-13: qty 10

## 2020-10-13 MED ORDER — LIDOCAINE HCL 2 % IJ SOLN
20.0000 mL | Freq: Once | INTRAMUSCULAR | Status: AC
  Administered 2020-10-13: 400 mg

## 2020-10-13 NOTE — Progress Notes (Signed)
Safety precautions to be maintained throughout the outpatient stay will include: orient to surroundings, keep bed in low position, maintain call bell within reach at all times, provide assistance with transfer out of bed and ambulation.  

## 2020-10-13 NOTE — Progress Notes (Signed)
PROVIDER NOTE: Information contained herein reflects review and annotations entered in association with encounter. Interpretation of such information and data should be left to medically-trained personnel. Information provided to patient can be located elsewhere in the medical record under "Patient Instructions". Document created using STT-dictation technology, any transcriptional errors that may result from process are unintentional.    Patient: Erin Contreras  Service Category: Procedure  Provider: Gillis Santa, MD  DOB: 1961/07/07  DOS: 10/13/2020  Location: [facility]  MRN: [id number]  Setting: Ambulatory - outpatient  Referring Provider: Venita Lick, NP  Type: Established Patient  Specialty: Interventional Pain Management  PCP: Venita Lick, NP   Primary Reason for Visit: Interventional Pain Management Treatment. CC: Back Pain (Right lumbar )  Procedure:          Anesthesia, Analgesia, Anxiolysis:  Type: Diagnostic Sacroiliac Joint Steroid Injection #1 and right piriformis injection (TPI) under fluoroscopy Region: Inferior Lumbosacral Region Level: PIIS (Posterior Inferior Iliac Spine) Laterality: Right-Side  Type: Moderate (Conscious) Sedation combined with Local Anesthesia Indication(s): Analgesia and Anxiety Route: Intravenous (IV) IV Access: Secured Sedation: Meaningful verbal contact was maintained at all times during the procedure  Local Anesthetic: Lidocaine 1-2%  Position: Prone           Indications: 1. SI joint arthritis   2. Piriformis syndrome of right side   3. Sacroiliac joint pain   4. Chronic pain syndrome    Pain Score: Pre-procedure: 8 /10 Post-procedure: 0-No pain/10   Pre-op H&P Assessment:  Ms. Orwick is a 60 y.o. (year old), female patient, seen today for interventional treatment. She  has a past surgical history that includes Cholecystectomy (2011); Tumor removal (Right, 2010); Sleeve Gastroplasty; Fracture surgery (Right); and  Knee Arthroplasty (Right, 07/11/2017). Ms. Howland has a current medication list which includes the following prescription(s): acyclovir, albuterol, vitamin d3, vitamin b-12, duloxetine, etanercept, folic acid, methotrexate, and pregabalin, and the following Facility-Administered Medications: fentanyl. Her primarily concern today is the Back Pain (Right lumbar )  Initial Vital Signs:  Pulse/HCG Rate: 71ECG Heart Rate: 78 Temp: (!) 97.3 F (36.3 C) Resp: 16 BP: 114/81 SpO2: 100 %  BMI: Estimated body mass index is 40.02 kg/m as calculated from the following:   Height as of this encounter: 5\' 9"  (1.753 m).   Weight as of this encounter: 271 lb (122.9 kg).  Risk Assessment: Allergies: Reviewed. She has No Known Allergies.  Allergy Precautions: None required Coagulopathies: Reviewed. None identified.  Blood-thinner therapy: None at this time Active Infection(s): Reviewed. None identified. Ms. Tarazon is afebrile  Site Confirmation: Ms. Fredenburg was asked to confirm the procedure and laterality before marking the site Procedure checklist: Completed Consent: Before the procedure and under the influence of no sedative(s), amnesic(s), or anxiolytics, the patient was informed of the treatment options, risks and possible complications. To fulfill our ethical and legal obligations, as recommended by the American Medical Association's Code of Ethics, I have informed the patient of my clinical impression; the nature and purpose of the treatment or procedure; the risks, benefits, and possible complications of the intervention; the alternatives, including doing nothing; the risk(s) and benefit(s) of the alternative treatment(s) or procedure(s); and the risk(s) and benefit(s) of doing nothing. The patient was provided information about the general risks and possible complications associated with the procedure. These may include, but are not limited to: failure to achieve desired goals, infection, bleeding,  organ or nerve damage, allergic reactions, paralysis, and death. In addition, the patient was informed  of those risks and complications associated to the procedure, such as failure to decrease pain; infection; bleeding; organ or nerve damage with subsequent damage to sensory, motor, and/or autonomic systems, resulting in permanent pain, numbness, and/or weakness of one or several areas of the body; allergic reactions; (i.e.: anaphylactic reaction); and/or death. Furthermore, the patient was informed of those risks and complications associated with the medications. These include, but are not limited to: allergic reactions (i.e.: anaphylactic or anaphylactoid reaction(s)); adrenal axis suppression; blood sugar elevation that in diabetics may result in ketoacidosis or comma; water retention that in patients with history of congestive heart failure may result in shortness of breath, pulmonary edema, and decompensation with resultant heart failure; weight gain; swelling or edema; medication-induced neural toxicity; particulate matter embolism and blood vessel occlusion with resultant organ, and/or nervous system infarction; and/or aseptic necrosis of one or more joints. Finally, the patient was informed that Medicine is not an exact science; therefore, there is also the possibility of unforeseen or unpredictable risks and/or possible complications that may result in a catastrophic outcome. The patient indicated having understood very clearly. We have given the patient no guarantees and we have made no promises. Enough time was given to the patient to ask questions, all of which were answered to the patient's satisfaction. Ms. Dombrosky has indicated that she wanted to continue with the procedure. Attestation: I, the ordering provider, attest that I have discussed with the patient the benefits, risks, side-effects, alternatives, likelihood of achieving goals, and potential problems during recovery for the procedure that  I have provided informed consent. Date  Time: 10/13/2020 10:47 AM  Pre-Procedure Preparation:  Monitoring: As per clinic protocol. Respiration, ETCO2, SpO2, BP, heart rate and rhythm monitor placed and checked for adequate function Safety Precautions: Patient was assessed for positional comfort and pressure points before starting the procedure. Time-out: I initiated and conducted the "Time-out" before starting the procedure, as per protocol. The patient was asked to participate by confirming the accuracy of the "Time Out" information. Verification of the correct person, site, and procedure were performed and confirmed by me, the nursing staff, and the patient. "Time-out" conducted as per Joint Commission's Universal Protocol (UP.01.01.01). Time: 1139  Description of Procedure:          Target Area: Inferior, posterior, aspect of the sacroiliac fissure Approach: Posterior, paraspinal, ipsilateral approach. Area Prepped: Entire Lower Lumbosacral Region DuraPrep (Iodine Povacrylex [0.7% available iodine] and Isopropyl Alcohol, 74% w/w) Safety Precautions: Aspiration looking for blood return was conducted prior to all injections. At no point did we inject any substances, as a needle was being advanced. No attempts were made at seeking any paresthesias. Safe injection practices and needle disposal techniques used. Medications properly checked for expiration dates. SDV (single dose vial) medications used. Description of the Procedure: Protocol guidelines were followed. The patient was placed in position over the procedure table. The target area was identified and the area prepped in the usual manner. Skin & deeper tissues infiltrated with local anesthetic. Appropriate amount of time allowed to pass for local anesthetics to take effect. The procedure needle was advanced under fluoroscopic guidance into the sacroiliac joint until a firm endpoint was obtained. Proper needle placement secured. Negative  aspiration confirmed. Solution injected in intermittent fashion, asking for systemic symptoms every 0.5cc of injectate. The needles were then removed and the area cleansed, making sure to leave some of the prepping solution back to take advantage of its long term bactericidal properties. Vitals:   10/13/20 1148 10/13/20  1158 10/13/20 1208 10/13/20 1218  BP: 122/84 120/80 121/82 122/84  Pulse:      Resp: 19 18 17 17   Temp:  (!) 97.4 F (36.3 C) (!) 97.3 F (36.3 C) (!) 97.3 F (36.3 C)  TempSrc:      SpO2: 98% 98% 98% 98%  Weight:      Height:        Start Time: 1140 hrs. End Time: 1147 hrs. Materials:  Needle(s) Type: Spinal Needle Gauge: 22G Length: 3.5-in Medication(s): Please see orders for medications and dosing details. 5 cc solution made of 4 cc of 0.2% ropivacaine, 1 cc of methylprednisolone, 40 mg/cc.  Injected into the right sacroiliac joint after contrast confirmation.  1 cm deep, 1 cm lateral and 1 cm inferior to the inferior pole of the SI joint fissure, a right piriformis muscle trigger point injection was performed under fluoroscopy after contrast confirmation. Imaging Guidance (Non-Spinal):          Type of Imaging Technique: Fluoroscopy Guidance (Non-Spinal) Indication(s): Assistance in needle guidance and placement for procedures requiring needle placement in or near specific anatomical locations not easily accessible without such assistance. Exposure Time: Please see nurses notes. Contrast: Before injecting any contrast, we confirmed that the patient did not have an allergy to iodine, shellfish, or radiological contrast. Once satisfactory needle placement was completed at the desired level, radiological contrast was injected. Contrast injected under live fluoroscopy. No contrast complications. See chart for type and volume of contrast used. Fluoroscopic Guidance: I was personally present during the use of fluoroscopy. "Tunnel Vision Technique" used to obtain the best  possible view of the target area. Parallax error corrected before commencing the procedure. "Direction-depth-direction" technique used to introduce the needle under continuous pulsed fluoroscopy. Once target was reached, antero-posterior, oblique, and lateral fluoroscopic projection used confirm needle placement in all planes. Images permanently stored in EMR. Interpretation: I personally interpreted the imaging intraoperatively. Adequate needle placement confirmed in multiple planes. Appropriate spread of contrast into desired area was observed. No evidence of afferent or efferent intravascular uptake. Permanent images saved into the patient's record.   Post-operative Assessment:  Post-procedure Vital Signs:  Pulse/HCG Rate: 7172 Temp: (!) 97.3 F (36.3 C) Resp: 17 BP: 122/84 SpO2: 98 %  EBL: None  Complications: No immediate post-treatment complications observed by team, or reported by patient.  Note: The patient tolerated the entire procedure well. A repeat set of vitals were taken after the procedure and the patient was kept under observation following institutional policy, for this type of procedure. Post-procedural neurological assessment was performed, showing return to baseline, prior to discharge. The patient was provided with post-procedure discharge instructions, including a section on how to identify potential problems. Should any problems arise concerning this procedure, the patient was given instructions to immediately contact us, at any time, without hesitation. In any case, we plan to contact the patient by telephone for a follow-up status report regarding this interventional procedure.  Comments:  No additional relevant information.  Plan of Care  Orders:  Orders Placed This Encounter  Procedures  . DG PAIN CLINIC C-ARM 1-60 MIN NO REPORT    Intraoperative interpretation by procedural physician at [place].    Standing Status:   Standing    Number of  Occurrences:   1    Order Specific Question:   Reason for exam:    Answer:   Assistance in needle guidance and placement for procedures requiring needle placement in or near specific anatomical locations not  easily accessible without such assistance.    Medications ordered for procedure: Meds ordered this encounter  Medications  . iohexol (OMNIPAQUE) 180 MG/ML injection 10 mL    Must be Myelogram-compatible. If not available, you may substitute with a water-soluble, non-ionic, hypoallergenic, myelogram-compatible radiological contrast medium.  Marland Kitchen lidocaine (XYLOCAINE) 2 % (with pres) injection 400 mg  . fentaNYL (SUBLIMAZE) injection 25-50 mcg    Make sure Narcan is available in the pyxis when using this medication. In the event of respiratory depression (RR< 8/min): Titrate NARCAN (naloxone) in increments of 0.1 to 0.2 mg IV at 2-3 minute intervals, until desired degree of reversal.  . dexamethasone (DECADRON) injection 10 mg  . ropivacaine (PF) 2 mg/mL (0.2%) (NAROPIN) injection 9 mL  . methylPREDNISolone acetate (DEPO-MEDROL) injection 40 mg   Medications administered: We administered iohexol, lidocaine, fentaNYL, dexamethasone, ropivacaine (PF) 2 mg/mL (0.2%), and methylPREDNISolone acetate.  See the medical record for exact dosing, route, and time of administration.  Follow-up plan:   Return for Keep [facility] appt.      Right L5-S1 ESI 05/28/2020 only helped for 1 week, return of pain.  Consider diagnostic lumbar facet medial branch nerve blocks in future.  Right sacroiliac joint, right piriformis injection 10/13/2020       Recent Visits Date Type Provider Dept  10/02/20 Office Visit Gillis Santa, MD [facility]  08/21/20 Office Visit Gillis Santa, MD [facility]  07/15/20 Telemedicine Gillis Santa, MD [facility]  Showing recent visits within past 90 days and meeting all other requirements Today's Visits Date Type Provider Dept  10/13/20 Procedure  visit Gillis Santa, MD [facility]  Showing today's visits and meeting all other requirements Future Appointments Date Type Provider Dept  11/25/20 Appointment Gillis Santa, MD [facility]  Showing future appointments within next 90 days and meeting all other requirements  Disposition: Discharge home  Discharge (Date  Time): 10/13/2020; 1229 hrs.   Primary Care Physician: Venita Lick, NP Location: [facility] Note by: Gillis Santa, MD Date: 10/13/2020; Time: 1:02 PM  Disclaimer:  Medicine is not an exact science. The only guarantee in medicine is that nothing is guaranteed. It is important to note that the decision to proceed with this intervention was based on the information collected from the patient. The Data and conclusions were drawn from the patient's questionnaire, the interview, and the physical examination. Because the information was provided in large part by the patient, it cannot be guaranteed that it has not been purposely or unconsciously manipulated. Every effort has been made to obtain as much relevant data as possible for this evaluation. It is important to note that the conclusions that lead to this procedure are derived in large part from the available data. Always take into account that the treatment will also be dependent on availability of resources and existing treatment guidelines, considered by other Pain Management Practitioners as being common knowledge and practice, at the time of the intervention. For Medico-Legal purposes, it is also important to point out that variation in procedural techniques and pharmacological choices are the acceptable norm. The indications, contraindications, technique, and results of the above procedure should only be interpreted and judged by a Board-Certified Interventional Pain Specialist with extensive familiarity and expertise in the same exact procedure and technique.

## 2020-10-14 ENCOUNTER — Telehealth: Payer: Self-pay

## 2020-10-14 NOTE — Telephone Encounter (Signed)
Post procedure phone call.  LM 

## 2020-10-16 ENCOUNTER — Ambulatory Visit: Payer: Self-pay | Admitting: *Deleted

## 2020-10-16 NOTE — Telephone Encounter (Signed)
Called pt advised of Jolene's message. Pt verbalized understanding

## 2020-10-16 NOTE — Telephone Encounter (Signed)
[name]: Dental office- calling with concerns about elevated blood sugar,weakness,excessive thrist, blurred vision, up at night .  They have no way to check patient sugar level.  Call to office- due to insurance there is only 1 provider in the office that can see patient and she is fully booked. Request note for PCP to review- they will call patient back.  Reason for Disposition . [1] Symptoms of high blood sugar (e.g., frequent urination, weak, weight loss) AND [2] not able to test blood glucose  Answer Assessment - Initial Assessment Questions 1. BLOOD GLUCOSE: "What is your blood glucose level?"      No- no meter at home 2. ONSET: "When did you check the blood glucose?"     n/a 3. USUAL RANGE: "What is your glucose level usually?" (e.g., usual fasting morning value, usual evening value)     Always told boaderline 4. KETONES: "Do you check for ketones (urine or blood test strips)?" If yes, ask: "What does the test show now?"      n/a 5. TYPE 1 or 2:  "Do you know what type of diabetes you have?"  (e.g., Type 1, Type 2, Gestational; doesn't know)      Not diagnosised 6. INSULIN: "Do you take insulin?" "What type of insulin(s) do you use? What is the mode of delivery? (syringe, pen; injection or pump)?"      no 7. DIABETES PILLS: "Do you take any pills for your diabetes?" If yes, ask: "Have you missed taking any pills recently?"     no 8. OTHER SYMPTOMS: "Do you have any symptoms?" (e.g., fever, frequent urination, difficulty breathing, dizziness, weakness, vomiting)    Frequent urination, dizziness , weakness 9. PREGNANCY: "Is there any chance you are pregnant?" "When was your last menstrual period?"     n/a  Protocols used: DIABETES - HIGH BLOOD SUGAR-A-AH

## 2020-10-16 NOTE — Telephone Encounter (Signed)
Keep appointment for tomorrow, she is seeing pain management and getting steroid injections -- this can cause elevations, we will check A1c tomorrow to ensure no diabetes presenting.  If worsening symptoms today recommend she immediately go to ER.

## 2020-10-17 ENCOUNTER — Other Ambulatory Visit: Payer: Self-pay

## 2020-10-17 ENCOUNTER — Ambulatory Visit: Payer: BC Managed Care – PPO | Admitting: Nurse Practitioner

## 2020-10-17 ENCOUNTER — Encounter: Payer: Self-pay | Admitting: Nurse Practitioner

## 2020-10-17 VITALS — BP 124/81 | HR 71 | Temp 98.2°F | Wt 276.2 lb

## 2020-10-17 DIAGNOSIS — I10 Essential (primary) hypertension: Secondary | ICD-10-CM

## 2020-10-17 DIAGNOSIS — R7303 Prediabetes: Secondary | ICD-10-CM

## 2020-10-17 DIAGNOSIS — E559 Vitamin D deficiency, unspecified: Secondary | ICD-10-CM

## 2020-10-17 DIAGNOSIS — F321 Major depressive disorder, single episode, moderate: Secondary | ICD-10-CM | POA: Diagnosis not present

## 2020-10-17 DIAGNOSIS — E669 Obesity, unspecified: Secondary | ICD-10-CM

## 2020-10-17 DIAGNOSIS — Z6839 Body mass index (BMI) 39.0-39.9, adult: Secondary | ICD-10-CM

## 2020-10-17 LAB — MICROALBUMIN, URINE WAIVED
Creatinine, Urine Waived: 200 mg/dL (ref 10–300)
Microalb, Ur Waived: 10 mg/L (ref 0–19)
Microalb/Creat Ratio: <30 mg/g (ref <30)

## 2020-10-17 LAB — BAYER DCA HB A1C WAIVED: HB A1C (BAYER DCA - WAIVED): 5.8 % (ref <7.0)

## 2020-10-17 NOTE — Assessment & Plan Note (Signed)
Ongoing and well controlled without medication.  BP at goal.  Continue diet focus and recommend regular exercise.

## 2020-10-17 NOTE — Assessment & Plan Note (Signed)
Ongoing, continue Duloxetine which benefits both mood and pain.  Denies SI/HI.  Discussed addition of therapy or Wellbutrin, at this time wishes to continue current regimen and will adjust as needed.  Return as scheduled.

## 2020-10-17 NOTE — Progress Notes (Signed)
BP 124/81   Pulse 71   Temp 98.2 F (36.8 C) (Oral)   Wt 276 lb 3.2 oz (125.3 kg)   BMI 40.79 kg/m    Subjective:    Patient ID: Erin Contreras, female    DOB: 1960-09-09, 60 y.o.   MRN: [id number]  HPI: Erin Contreras is a 60 y.o. female  Chief Complaint  Patient presents with  . Blood Sugar Problem   IFG She reports being sent her by her eye doctor, they were worried about her sugars due to her symptoms of dry mouth, fatigued, blurred vision, and increased thirst.  Is getting steroid injections to SI joints with pain management  Polydipsia/polyuria: yes Visual disturbance: yes Chest pain: no Paresthesias: no Retinal Examination: Up to Date Influenza: Up to Date Aspirin: no   DEPRESSION Lost her niece recently and another family member, does endorse sadness.  Continues on Duloxetine. Mood status: stable Satisfied with current treatment?: yes Symptom severity: moderate  Duration of current treatment : chronic Side effects: no Medication compliance: good compliance Psychotherapy/counseling: none Depressed mood: yes Anxious mood: no Anhedonia: no Significant weight loss or gain: no Insomnia: yes hard to fall asleep Fatigue: yes Feelings of worthlessness or guilt: yes Impaired concentration/indecisiveness: yes Suicidal ideations: no Hopelessness: yes Crying spells: yes Depression screen [facility] 2/9 10/17/2020 09/03/2020 08/21/2020 05/28/2020 05/06/2020  Decreased Interest 3 0 0 0 0  Down, Depressed, Hopeless 3 0 0 0 0  PHQ - 2 Score 6 0 0 0 0  Altered sleeping 2 - - - -  Tired, decreased energy 3 - - - -  Change in appetite 1 - - - -  Feeling bad or failure about yourself  0 - - - -  Trouble concentrating 1 - - - -  Moving slowly or fidgety/restless 0 - - - -  Suicidal thoughts 0 - - - -  PHQ-9 Score 13 - - - -  Difficult doing work/chores Somewhat difficult - - - -    Relevant past medical, surgical, family and social history reviewed and updated as indicated.  Interim medical history since our last visit reviewed. Allergies and medications reviewed and updated.  Review of Systems  Constitutional: Positive for fatigue. Negative for activity change, appetite change, diaphoresis and fever.  Respiratory: Negative.   Cardiovascular: Negative.   Gastrointestinal: Negative.   Endocrine: Positive for polyphagia. Negative for cold intolerance, heat intolerance, polydipsia and polyuria.  Neurological: Negative.   Psychiatric/Behavioral: Positive for decreased concentration and sleep disturbance. Negative for self-injury and suicidal ideas. The patient is not nervous/anxious.     Per HPI unless specifically indicated above     Objective:    BP 124/81   Pulse 71   Temp 98.2 F (36.8 C) (Oral)   Wt 276 lb 3.2 oz (125.3 kg)   BMI 40.79 kg/m   Wt Readings from Last 3 Encounters:  10/17/20 276 lb 3.2 oz (125.3 kg)  10/13/20 271 lb (122.9 kg)  10/08/20 271 lb 6.4 oz (123.1 kg)    Physical Exam Vitals and nursing note reviewed.  Constitutional:      General: She is awake. She is not in acute distress.    Appearance: She is well-developed and well-groomed. She is obese. She is not ill-appearing.  HENT:     Head: Normocephalic.     Right Ear: Hearing normal.     Left Ear: Hearing normal.  Eyes:     General: Lids are normal.  Right eye: No discharge.        Left eye: No discharge.     Conjunctiva/sclera: Conjunctivae normal.     Pupils: Pupils are equal, round, and reactive to light.  Neck:     Vascular: No carotid bruit.  Cardiovascular:     Rate and Rhythm: Normal rate and regular rhythm.     Heart sounds: Normal heart sounds. No murmur heard. No gallop.   Pulmonary:     Effort: Pulmonary effort is normal. No accessory muscle usage or respiratory distress.     Breath sounds: Normal breath sounds.  Abdominal:     General: Bowel sounds are normal. There is no distension.     Palpations: Abdomen is soft.     Tenderness: There is no  abdominal tenderness.  Musculoskeletal:     Cervical back: Normal range of motion and neck supple.     Right lower leg: No edema.     Left lower leg: No edema.  Skin:    General: Skin is warm and dry.  Neurological:     Mental Status: She is alert and oriented to person, place, and time.  Psychiatric:        Attention and Perception: Attention normal.        Mood and Affect: Mood normal.        Speech: Speech normal.        Behavior: Behavior normal. Behavior is cooperative.        Thought Content: Thought content normal.     Results for orders placed or performed in visit on 07/24/20  WET PREP FOR [place], YEAST, CLUE   Specimen: Sterile Swab   Sterile Swab  Result Value Ref Range   Trichomonas Exam Negative Negative   Yeast Exam Negative Negative   Clue Cell Exam Positive (A) Negative  GC/Chlamydia Probe Amp   Specimen: Sterile Swab   UR  Result Value Ref Range   Chlamydia trachomatis, NAA Negative Negative   Neisseria Gonorrhoeae by PCR Negative Negative  Urinalysis, Routine w reflex microscopic  Result Value Ref Range   Specific Gravity, UA >1.030 (H) 1.005 - 1.030   pH, UA 5.0 5.0 - 7.5   Color, UA Yellow Yellow   Appearance Ur Clear Clear   Leukocytes,UA Negative Negative   Protein,UA Negative Negative/Trace   Glucose, UA Negative Negative   Ketones, UA Negative Negative   RBC, UA Negative Negative   Bilirubin, UA Negative Negative   Urobilinogen, Ur 0.2 0.2 - 1.0 mg/dL   Nitrite, UA Negative Negative  HIV Antibody (routine testing w rflx)  Result Value Ref Range   HIV Screen 4th Generation wRfx Non Reactive Non Reactive  RPR  Result Value Ref Range   RPR Ser Ql Non Reactive Non Reactive      Assessment & Plan:   Problem List Items Addressed This Visit      Cardiovascular and Mediastinum   Benign hypertension    Ongoing and well controlled without medication.  BP at goal.  Continue diet focus and recommend regular exercise.      Relevant Orders    Microalbumin, Urine Waived   Comprehensive metabolic panel   Lipid Panel w/o Chol/HDL Ratio   TSH     Other   Obesity    BMI 40.79.  Recommended eating smaller high protein, low fat meals more frequently and exercising 30 mins a day 5 times a week with a goal of 10-15lb weight loss in the next 3 months. Patient voiced their  understanding and motivation to adhere to these recommendations.       Vitamin D deficiency    Continue supplement and check Vit D level today.      Relevant Orders   VITAMIN D 25 Hydroxy (Vit-D Deficiency, Fractures)   Prediabetes    A1c today 5.8%, remains stable with diet control, no elevations.  Suspect recent symptoms related to increased stressors with loss of her niece, who she mainly raised.  Urine ALB 10.  Continue diet monitoring and check A1c again in 3-6 months.      Relevant Orders   Bayer DCA Hb A1c Waived   Depression, major, single episode, moderate (HCC) - Primary    Ongoing, continue Duloxetine which benefits both mood and pain.  Denies SI/HI.  Discussed addition of therapy or Wellbutrin, at this time wishes to continue current regimen and will adjust as needed.  Return as scheduled.          Follow up plan: Return for as scheduled.

## 2020-10-17 NOTE — Patient Instructions (Signed)

## 2020-10-17 NOTE — Assessment & Plan Note (Signed)
A1c today 5.8%, remains stable with diet control, no elevations.  Suspect recent symptoms related to increased stressors with loss of her niece, who she mainly raised.  Urine ALB 10.  Continue diet monitoring and check A1c again in 3-6 months.

## 2020-10-17 NOTE — Assessment & Plan Note (Signed)
Continue supplement and check Vit D level today.

## 2020-10-17 NOTE — Assessment & Plan Note (Signed)
BMI 40.79.  Recommended eating smaller high protein, low fat meals more frequently and exercising 30 mins a day 5 times a week with a goal of 10-15lb weight loss in the next 3 months. Patient voiced their understanding and motivation to adhere to these recommendations.

## 2020-10-18 LAB — LIPID PANEL W/O CHOL/HDL RATIO
Cholesterol, Total: 203 mg/dL — ABNORMAL HIGH (ref 100–199)
HDL: 73 mg/dL (ref >39)
LDL Chol Calc (NIH): 110 mg/dL — ABNORMAL HIGH (ref 0–99)
Triglycerides: 115 mg/dL (ref 0–149)
VLDL Cholesterol Cal: 20 mg/dL (ref 5–40)

## 2020-10-18 LAB — COMPREHENSIVE METABOLIC PANEL
ALT: 19 IU/L (ref 0–32)
AST: 18 IU/L (ref 0–40)
Albumin/Globulin Ratio: 1.5 (ref 1.2–2.2)
Albumin: 4.1 g/dL (ref 3.8–4.8)
Alkaline Phosphatase: 107 IU/L (ref 44–121)
BUN/Creatinine Ratio: 19 (ref 12–28)
BUN: 13 mg/dL (ref 8–27)
Bilirubin Total: 0.4 mg/dL (ref 0.0–1.2)
CO2: 25 mmol/L (ref 20–29)
Calcium: 9.3 mg/dL (ref 8.7–10.3)
Chloride: 100 mmol/L (ref 96–106)
Creatinine, Ser: 0.7 mg/dL (ref 0.57–1.00)
Globulin, Total: 2.8 g/dL (ref 1.5–4.5)
Glucose: 91 mg/dL (ref 65–99)
Potassium: 4 mmol/L (ref 3.5–5.2)
Sodium: 141 mmol/L (ref 134–144)
Total Protein: 6.9 g/dL (ref 6.0–8.5)
eGFR: 98 mL/min/{1.73_m2} (ref >59)

## 2020-10-18 LAB — TSH: TSH: 2.06 u[IU]/mL (ref 0.450–4.500)

## 2020-10-18 LAB — VITAMIN D 25 HYDROXY (VIT D DEFICIENCY, FRACTURES): Vit D, 25-Hydroxy: 24.3 ng/mL — ABNORMAL LOW (ref 30.0–100.0)

## 2020-10-18 NOTE — Progress Notes (Signed)
Good morning, please let [name] know her labs have returned.  Kidney and liver function are all normal.  Thyroid lab is normal.  Vitamin D remains on lower side, please continue to take Vitamin D supplement I sent in weekly.  Cholesterol levels show LDL and total cholesterol above goal, but no need for medication at this time.  Would recommend heavy focus on healthy diet and regular exercise.  Any questions? Keep being awesome!!  Thank you for allowing me to participate in your care. Kindest regards, Jakeia Carreras

## 2020-11-01 ENCOUNTER — Other Ambulatory Visit: Payer: Self-pay | Admitting: Unknown Physician Specialty

## 2020-11-07 ENCOUNTER — Ambulatory Visit: Payer: BC Managed Care – PPO | Admitting: Dietician

## 2020-11-10 ENCOUNTER — Telehealth: Payer: Self-pay | Admitting: Student in an Organized Health Care Education/Training Program

## 2020-11-10 NOTE — Telephone Encounter (Signed)
Attempted to return patient call. LM to call office.

## 2020-11-11 NOTE — Telephone Encounter (Signed)
LM to call office

## 2020-11-12 ENCOUNTER — Telehealth: Payer: Self-pay | Admitting: Student in an Organized Health Care Education/Training Program

## 2020-11-12 ENCOUNTER — Other Ambulatory Visit: Payer: Self-pay | Admitting: Nurse Practitioner

## 2020-11-12 NOTE — Telephone Encounter (Signed)
Spoke with pharmacy and they states that they do have a Lyrica prescription that has not been picked up.  Patient notified.

## 2020-11-12 NOTE — Telephone Encounter (Signed)
Requested Prescriptions  Pending Prescriptions Disp Refills  . acyclovir (ZOVIRAX) 400 MG tablet [Pharmacy Med Name: ACYCLOVIR 400 MG TABLET] 180 tablet 0    Sig: TAKE 1 TABLET BY MOUTH TWICE A DAY AS NEEDED     Antimicrobials:  Antiviral Agents - Anti-Herpetic Passed - 11/12/2020  6:58 PM      Passed - Valid encounter within last 12 months    Recent Outpatient Visits          3 weeks ago Depression, major, single episode, moderate ([place])   [place], Jolene T, NP   1 month ago Impacted cerumen, right ear   [place], Jolene T, NP   3 months ago Class 3 severe obesity due to excess calories without serious comorbidity with body mass index (BMI) of 40.0 to 44.9 in adult [facility])   [place], Jolene T, NP   5 months ago Class 3 severe obesity due to excess calories without serious comorbidity with body mass index (BMI) of 40.0 to 44.9 in adult [facility])   [place], Henrine Screws T, NP   6 months ago Flu vaccine need   [facility] Kathrine Haddock, NP

## 2020-11-13 DIAGNOSIS — M0579 Rheumatoid arthritis with rheumatoid factor of multiple sites without organ or systems involvement: Secondary | ICD-10-CM | POA: Diagnosis not present

## 2020-11-13 DIAGNOSIS — M170 Bilateral primary osteoarthritis of knee: Secondary | ICD-10-CM | POA: Diagnosis not present

## 2020-11-13 DIAGNOSIS — Z79899 Other long term (current) drug therapy: Secondary | ICD-10-CM | POA: Diagnosis not present

## 2020-11-13 DIAGNOSIS — M069 Rheumatoid arthritis, unspecified: Secondary | ICD-10-CM | POA: Diagnosis not present

## 2020-11-24 ENCOUNTER — Encounter: Payer: Self-pay | Admitting: Dietician

## 2020-11-24 NOTE — Progress Notes (Signed)
Have not heard back from patient after she missed her second consecutive appointment on 11/07/20. Sent notification to referring provider.

## 2020-11-25 ENCOUNTER — Encounter: Payer: BC Managed Care – PPO | Admitting: Student in an Organized Health Care Education/Training Program

## 2020-12-01 DIAGNOSIS — H6121 Impacted cerumen, right ear: Secondary | ICD-10-CM | POA: Diagnosis not present

## 2020-12-04 ENCOUNTER — Telehealth: Payer: Self-pay | Admitting: Student in an Organized Health Care Education/Training Program

## 2020-12-04 NOTE — Telephone Encounter (Signed)
Patient states she is in a lot of pain and can hardly walk. Her appt is 12-25-20. No other appts. Available. She wants to see if he can change her meds because nothing is helping at this point. Wants nurse to call her

## 2020-12-04 NOTE — Telephone Encounter (Signed)
Patient states she is in a lot of pain and can hardly walk. Her appt is 12-25-20. No other appts. Available. She wants to see if he can change her meds because nothing is helping at this point. Wants nurse to call her 

## 2020-12-10 DIAGNOSIS — M0579 Rheumatoid arthritis with rheumatoid factor of multiple sites without organ or systems involvement: Secondary | ICD-10-CM | POA: Diagnosis not present

## 2020-12-15 DIAGNOSIS — M545 Low back pain, unspecified: Secondary | ICD-10-CM | POA: Diagnosis not present

## 2020-12-24 DIAGNOSIS — M0579 Rheumatoid arthritis with rheumatoid factor of multiple sites without organ or systems involvement: Secondary | ICD-10-CM | POA: Diagnosis not present

## 2020-12-25 ENCOUNTER — Other Ambulatory Visit: Payer: Self-pay

## 2020-12-25 ENCOUNTER — Ambulatory Visit
Payer: BC Managed Care – PPO | Attending: Student in an Organized Health Care Education/Training Program | Admitting: Student in an Organized Health Care Education/Training Program

## 2020-12-25 ENCOUNTER — Encounter: Payer: Self-pay | Admitting: Student in an Organized Health Care Education/Training Program

## 2020-12-25 ENCOUNTER — Telehealth: Payer: Self-pay | Admitting: Student in an Organized Health Care Education/Training Program

## 2020-12-25 VITALS — BP 129/91 | HR 76 | Temp 97.0°F | Resp 16 | Ht 69.0 in | Wt 279.0 lb

## 2020-12-25 DIAGNOSIS — G894 Chronic pain syndrome: Secondary | ICD-10-CM | POA: Diagnosis not present

## 2020-12-25 DIAGNOSIS — M5416 Radiculopathy, lumbar region: Secondary | ICD-10-CM | POA: Insufficient documentation

## 2020-12-25 DIAGNOSIS — M47816 Spondylosis without myelopathy or radiculopathy, lumbar region: Secondary | ICD-10-CM | POA: Diagnosis not present

## 2020-12-25 DIAGNOSIS — G8929 Other chronic pain: Secondary | ICD-10-CM | POA: Insufficient documentation

## 2020-12-25 DIAGNOSIS — G5701 Lesion of sciatic nerve, right lower limb: Secondary | ICD-10-CM | POA: Diagnosis not present

## 2020-12-25 DIAGNOSIS — M47818 Spondylosis without myelopathy or radiculopathy, sacral and sacrococcygeal region: Secondary | ICD-10-CM | POA: Diagnosis not present

## 2020-12-25 DIAGNOSIS — M533 Sacrococcygeal disorders, not elsewhere classified: Secondary | ICD-10-CM | POA: Insufficient documentation

## 2020-12-25 MED ORDER — DULOXETINE HCL 20 MG PO CPEP: 40.0000 mg | ORAL_CAPSULE | Freq: Every day | ORAL | 1 refills | Status: DC

## 2020-12-25 MED ORDER — PREGABALIN 75 MG PO CAPS: 75.0000 mg | ORAL_CAPSULE | Freq: Two times a day (BID) | ORAL | 1 refills | Status: DC

## 2020-12-25 NOTE — Addendum Note (Signed)
Addended by: Gillis Santa on: 12/25/2020 01:33 PM   Modules accepted: Orders

## 2020-12-25 NOTE — Progress Notes (Signed)
Safety precautions to be maintained throughout the outpatient stay will include: orient to surroundings, keep bed in low position, maintain call bell within reach at all times, provide assistance with transfer out of bed and ambulation.  

## 2020-12-25 NOTE — Telephone Encounter (Signed)
Patient called and states that when she went to pharmacy they only had the cymbalta,  They did not have the lyrica that was also sent today.    Called pharmacy and they report all they received was the cymbalta.    I will send message to Dr Holley Raring and ask to resend.

## 2020-12-25 NOTE — Progress Notes (Signed)
PROVIDER NOTE: Information contained herein reflects review and annotations entered in association with encounter. Interpretation of such information and data should be left to medically-trained personnel. Information provided to patient can be located elsewhere in the medical record under "Patient Instructions". Document created using STT-dictation technology, any transcriptional errors that may result from process are unintentional.    Patient: Erin Contreras  Service Category: E/M  Provider: Gillis Santa, MD  DOB: 1961-03-24  DOS: 12/25/2020  Specialty: Interventional Pain Management  MRN: [id number]  Setting: Ambulatory outpatient  PCP: Venita Lick, NP  Type: Established Patient    Referring Provider: Venita Lick, NP  Location: Office  Delivery: Face-to-face     HPI  [name], a 60 y.o. year old female, is here today because of her SI joint arthritis [M47.818]. Ms. Braaten primary complain today is Back Pain Last encounter: My last encounter with her was on 12/04/2020. Pertinent problems: Ms. Mckeag has OA (osteoarthritis) of knee; Obesity; Chronic radicular lumbar pain; Lumbar spondylosis; Lumbar degenerative disc disease; Sacroiliac joint pain; SI joint arthritis; and Chronic pain syndrome on their pertinent problem list. Pain Assessment: Severity of Chronic pain is reported as a 10-Worst pain ever/10. Location: Back Lower/right buttocks/hips  goes down side of leg to ankle. Onset: More than a month ago. Quality: Aching,Constant,Radiating,Discomfort. Timing: Constant. Modifying factor(s): "Nothing is helping med' I am using voltrane cream. Vitals:  height is $RemoveB'5\' 9"'kPBwLacu$  (1.753 m) and weight is 279 lb (126.6 kg). Her temperature is 97 F (36.1 C) (abnormal). Her blood pressure is 129/91 (abnormal) and her pulse is 76. Her respiration is 16 and oxygen saturation is 99%.   Reason for encounter: both, medication management and post-procedure assessment.     Post-Procedure  Evaluation  Procedure (10/13/2020): Type: Diagnostic Sacroiliac Joint Steroid Injection #1 and right piriformis injection (TPI) under fluoroscopy Region: Inferior Lumbosacral Region Level: PIIS (Posterior Inferior Iliac Spine) Laterality: Right-Side  Sedation: Please see nurses note.  Effectiveness during initial hour after procedure(Ultra-Short Term Relief): 100 %  Local anesthetic used: Long-acting (4-6 hours) Effectiveness: Defined as any analgesic benefit obtained secondary to the administration of local anesthetics. This carries significant diagnostic value as to the etiological location, or anatomical origin, of the pain. Duration of benefit is expected to coincide with the duration of the local anesthetic used.  Effectiveness during initial 4-6 hours after procedure(Short-Term Relief): 75 %  Long-term benefit: Defined as any relief past the pharmacologic duration of the local anesthetics.  Effectiveness past the initial 6 hours after procedure(Long-Term Relief): 50% for 3 weeks  Current benefits: Defined as benefit that persist at this time.   Analgesia:  Back to baseline   ROS  Constitutional: Denies any fever or chills Gastrointestinal: No reported hemesis, hematochezia, vomiting, or acute GI distress Musculoskeletal: Low back, right hip, right SI joint pain Neurological: No reported episodes of acute onset apraxia, aphasia, dysarthria, agnosia, amnesia, paralysis, loss of coordination, or loss of consciousness  Medication Review  DULoxetine, Vitamin B-12, Vitamin D3, acyclovir, albuterol, etanercept, folic acid, methotrexate, and pregabalin  History Review  Allergy: Ms. Steveson has No Known Allergies. Drug: Ms. Guadron  reports no history of drug use. Alcohol:  reports no history of alcohol use. Tobacco:  reports that she has never smoked. She has never used smokeless tobacco. Social: Ms. Hogland  reports that she has never smoked. She has never used smokeless tobacco. She  reports that she does not drink alcohol and does not use drugs. Medical:  has a  past medical history of Boils, Ear mass, Hemorrhoids, Herpes simplex virus infection, HSV infection, Lumbago with sciatica, OA (osteoarthritis) of knee, Obesity, Overactive bladder, PONV (postoperative nausea and vomiting), RA (rheumatoid arthritis) ([place]), Sleep apnea, and Urge and stress incontinence. Surgical: Ms. Signer  has a past surgical history that includes Cholecystectomy (2011); Tumor removal (Right, 2010); Sleeve Gastroplasty; Fracture surgery (Right); and Knee Arthroplasty (Right, 07/11/2017). Family: family history includes Cancer in her mother; Cirrhosis in her father; Diabetes in her mother, paternal grandfather, and paternal grandmother; Heart disease in her paternal grandfather and paternal grandmother; Hyperlipidemia in her mother; Hypertension in her daughter and mother; Stroke in her maternal grandfather and maternal grandmother.  Laboratory Chemistry Profile   Renal Lab Results  Component Value Date   BUN 13 10/17/2020   CREATININE 0.70 10/17/2020   BCR 19 10/17/2020   GFRAA 102 06/12/2020   GFRNONAA 88 06/12/2020     Hepatic Lab Results  Component Value Date   AST 18 10/17/2020   ALT 19 10/17/2020   ALBUMIN 4.1 10/17/2020   ALKPHOS 107 10/17/2020   LIPASE 106 05/10/2014     Electrolytes Lab Results  Component Value Date   NA 141 10/17/2020   K 4.0 10/17/2020   CL 100 10/17/2020   CALCIUM 9.3 10/17/2020   MG 2.0 09/04/2014   PHOS 3.7 09/04/2014     Bone Lab Results  Component Value Date   VD25OH 24.3 (L) 10/17/2020     Inflammation (CRP: Acute Phase) (ESR: Chronic Phase) Lab Results  Component Value Date   CRP <1 03/17/2018   ESRSEDRATE 19 03/17/2018       Note: Above Lab results reviewed.   Physical Exam  General appearance: Well nourished, well developed, and well hydrated. In no apparent acute distress Mental status: Alert, oriented x 3 (person, place, & time)        Respiratory: No evidence of acute respiratory distress Eyes: PERLA Vitals: BP (!) 129/91   Pulse 76   Temp (!) 97 F (36.1 C)   Resp 16   Ht $R'5\' 9"'uy$  (1.753 m)   Wt 279 lb (126.6 kg)   SpO2 99%   BMI 41.20 kg/m  BMI: Estimated body mass index is 41.2 kg/m as calculated from the following:   Height as of this encounter: $RemoveBeforeD'5\' 9"'MsjwoICEWnKlgZ$  (1.753 m).   Weight as of this encounter: 279 lb (126.6 kg). Ideal: Ideal body weight: 66.2 kg (145 lb 15.1 oz) Adjusted ideal body weight: 90.3 kg (199 lb 2.7 oz)  Low back pain, bilateral hip pain Bilateral SI joint pain Lower extremity myalgias and arthralgias  Assessment   Status Diagnosis  Controlled Controlled Controlled 1. SI joint arthritis   2. Piriformis syndrome of right side   3. Sacroiliac joint pain   4. Chronic radicular lumbar pain (right)   5. Lumbar radiculopathy (right)   6. Lumbar facet arthropathy   7. Lumbar spondylosis   8. Chronic pain syndrome        Plan of Care  [name] has a current medication list which includes the following long-term medication(s): albuterol, duloxetine, etanercept, and pregabalin.  Ms. Gingerich presents today for post procedure evaluation as well as medication management.  Unfortunately, her SI joint injection did not last as long as we had hoped.  She is status post right lumbar epidural steroid injection as well as right sacroiliac joint injection without any significant long-term benefit.  Patient also has rheumatoid arthritis.  She brought in a prescription of Cymbalta  that was prescribed by her rheumatologist for 30 mg.  I told her that I previously had her on Cymbalta at 40 mg.  Patient was somewhat confused.  This can explain why she has had increased pain.  I recommend that she increase her Cymbalta back to 40 mg daily.  She is also not taking the Lyrica that I had prescribed her.  I will resend a prescription for this.  She will follow-up in 8 weeks.  Future considerations could  include buprenorphine.   Pharmacotherapy (Medications Ordered): Meds ordered this encounter  Medications  . pregabalin (LYRICA) 75 MG capsule    Sig: Take 1 capsule (75 mg total) by mouth 2 (two) times daily.    Dispense:  60 capsule    Refill:  1    Fill one day early if pharmacy is closed on scheduled refill date. May substitute for generic if available.  . DULoxetine (CYMBALTA) 20 MG capsule    Sig: Take 2 capsules (40 mg total) by mouth daily.    Dispense:  60 capsule    Refill:  1    Follow-up plan:   Return in about 8 weeks (around 02/19/2021) for Medication Management, in person.     Right L5-S1 ESI 05/28/2020 only helped for 1 week, return of pain.  Consider diagnostic lumbar facet medial branch nerve blocks in future.  Right sacroiliac joint, right piriformis injection 10/13/2020        Recent Visits Date Type Provider Dept  10/13/20 Procedure visit Gillis Santa, MD [facility]  10/02/20 Office Visit Gillis Santa, MD [facility]  Showing recent visits within past 90 days and meeting all other requirements Today's Visits Date Type Provider Dept  12/25/20 Office Visit Gillis Santa, MD [facility]  Showing today's visits and meeting all other requirements Future Appointments Date Type Provider Dept  02/19/21 Appointment Gillis Santa, MD [facility]  Showing future appointments within next 90 days and meeting all other requirements  I discussed the assessment and treatment plan with the patient. The patient was provided an opportunity to ask questions and all were answered. The patient agreed with the plan and demonstrated an understanding of the instructions.  Patient advised to call back or seek an in-person evaluation if the symptoms or condition worsens.  Duration of encounter: 30 minutes.  Note by: Gillis Santa, MD Date: 12/25/2020; Time: 8:42 AM

## 2020-12-30 ENCOUNTER — Other Ambulatory Visit: Payer: Self-pay | Admitting: Pain Medicine

## 2020-12-30 ENCOUNTER — Telehealth: Payer: Self-pay | Admitting: Student in an Organized Health Care Education/Training Program

## 2020-12-30 DIAGNOSIS — M47818 Spondylosis without myelopathy or radiculopathy, sacral and sacrococcygeal region: Secondary | ICD-10-CM

## 2020-12-30 DIAGNOSIS — G5701 Lesion of sciatic nerve, right lower limb: Secondary | ICD-10-CM

## 2020-12-30 DIAGNOSIS — M792 Neuralgia and neuritis, unspecified: Secondary | ICD-10-CM | POA: Insufficient documentation

## 2020-12-30 DIAGNOSIS — G894 Chronic pain syndrome: Secondary | ICD-10-CM

## 2020-12-30 DIAGNOSIS — M533 Sacrococcygeal disorders, not elsewhere classified: Secondary | ICD-10-CM

## 2020-12-30 MED ORDER — PREGABALIN 75 MG PO CAPS: 75.0000 mg | ORAL_CAPSULE | Freq: Two times a day (BID) | ORAL | 1 refills | Status: DC

## 2020-12-30 NOTE — Telephone Encounter (Signed)
Script did not go through. Dr. Holley Raring called and ask if Dr. Dossie Arbour would send in for him. Dr. Dossie Arbour sent bubble to send RX.

## 2020-12-30 NOTE — Progress Notes (Signed)
Prescription for Lyrica resent to pharmacy while covering for Dr. Fredrich Romans vacation.

## 2020-12-31 NOTE — Telephone Encounter (Signed)
Patient called and left VM that Lyrica script was sent in yesterday.

## 2021-01-07 DIAGNOSIS — M0579 Rheumatoid arthritis with rheumatoid factor of multiple sites without organ or systems involvement: Secondary | ICD-10-CM | POA: Diagnosis not present

## 2021-01-08 DIAGNOSIS — Z96651 Presence of right artificial knee joint: Secondary | ICD-10-CM | POA: Diagnosis not present

## 2021-01-08 DIAGNOSIS — M1712 Unilateral primary osteoarthritis, left knee: Secondary | ICD-10-CM | POA: Diagnosis not present

## 2021-01-11 NOTE — Discharge Instructions (Signed)
Instructions after Total Knee Replacement   Decker Cogdell P. Morrison Mcbryar, Jr., M.D.     Dept. of Orthopaedics & Sports Medicine  [name]  [address]  [place], [place]  [address]  Phone: [phone]   Fax: [phone]    DIET: Drink plenty of non-alcoholic fluids. Resume your normal diet. Include foods high in fiber.  ACTIVITY:  You may use crutches or a walker with weight-bearing as tolerated, unless instructed otherwise. You may be weaned off of the walker or crutches by your Physical Therapist.  Do NOT place pillows under the knee. Anything placed under the knee could limit your ability to straighten the knee.   Continue doing gentle exercises. Exercising will reduce the pain and swelling, increase motion, and prevent muscle weakness.   Please continue to use the TED compression stockings for 6 weeks. You may remove the stockings at night, but should reapply them in the morning. Do not drive or operate any equipment until instructed.  WOUND CARE:  Continue to use the PolarCare or ice packs periodically to reduce pain and swelling. You may bathe or shower after the staples are removed at the first office visit following surgery.  MEDICATIONS: You may resume your regular medications. Please take the pain medication as prescribed on the medication. Do not take pain medication on an empty stomach. You have been given a prescription for a blood thinner (Lovenox or Coumadin). Please take the medication as instructed. (NOTE: After completing a 2 week course of Lovenox, take one Enteric-coated aspirin once a day. This along with elevation will help reduce the possibility of phlebitis in your operated leg.) Do not drive or drink alcoholic beverages when taking pain medications.  CALL THE OFFICE FOR: Temperature above 101 degrees Excessive bleeding or drainage on the dressing. Excessive swelling, coldness, or paleness of the toes. Persistent nausea and vomiting.  FOLLOW-UP:  You  should have an appointment to return to the office in 10-14 days after surgery. Arrangements have been made for continuation of Physical Therapy (either home therapy or outpatient therapy).   [name] Department Directory         [email or web address]       [email or web address]          Cardiology  Appointments: [place] - [phone] [place] - [phone]  Endocrinology  Appointments: [place] - [phone] [place] - [phone]  Gastroenterology  Appointments: [place] - [phone] [place] - [phone]        General Surgery   Appointments: [place] - [phone]  Internal Medicine/Family Medicine  Appointments: [place] - [phone] [place] - [phone] [place] - [phone]  Metabolic and Weigh Loss Surgery  Appointments: [place] - [phone]        Neurology  Appointments: [place] - [phone] [place] - [phone]  Neurosurgery  Appointments: [place] - [phone]  Obstetrics & Gynecology  Appointments: [place] - [phone] [place] - [phone]        Pediatrics  Appointments: [place] - [phone] [place] - [phone]  Physiatry  Appointments: [place] -[phone]  Physical Therapy  Appointments: [place] - [phone] [place] - [phone]        Podiatry  Appointments: [place] - [phone] [place] - [phone]  Pulmonology  Appointments: [place] - [phone]  Rheumatology  Appointments: [place] - [phone]        [place] Location: [name]  [address] [place], [place]  [address]  [place] Location: [name] [address] [place], [place]  [address]  [place] Location: [name] [address] [place], [place]  [address]    

## 2021-01-12 ENCOUNTER — Encounter: Payer: Self-pay | Admitting: Nurse Practitioner

## 2021-01-12 ENCOUNTER — Other Ambulatory Visit: Payer: Self-pay

## 2021-01-12 ENCOUNTER — Ambulatory Visit: Payer: BC Managed Care – PPO | Admitting: Nurse Practitioner

## 2021-01-12 VITALS — BP 126/82 | HR 69 | Temp 98.2°F | Wt 279.2 lb

## 2021-01-12 DIAGNOSIS — N898 Other specified noninflammatory disorders of vagina: Secondary | ICD-10-CM | POA: Diagnosis not present

## 2021-01-12 DIAGNOSIS — R350 Frequency of micturition: Secondary | ICD-10-CM

## 2021-01-12 LAB — URINALYSIS, ROUTINE W REFLEX MICROSCOPIC
Bilirubin, UA: NEGATIVE
Glucose, UA: NEGATIVE
Ketones, UA: NEGATIVE
Leukocytes,UA: NEGATIVE
Nitrite, UA: NEGATIVE
Protein,UA: NEGATIVE
Specific Gravity, UA: 1.02 (ref 1.005–1.030)
Urobilinogen, Ur: 0.2 mg/dL (ref 0.2–1.0)
pH, UA: 7.5 (ref 5.0–7.5)

## 2021-01-12 LAB — MICROSCOPIC EXAMINATION
Bacteria, UA: NONE SEEN
Epithelial Cells (non renal): NONE SEEN /hpf (ref 0–10)
WBC, UA: NONE SEEN /hpf (ref 0–5)

## 2021-01-12 LAB — WET PREP FOR TRICH, YEAST, CLUE
Clue Cell Exam: NEGATIVE
Trichomonas Exam: NEGATIVE
Yeast Exam: NEGATIVE

## 2021-01-12 NOTE — Progress Notes (Signed)
Acute Office Visit  Subjective:    Patient ID: Erin Contreras, female    DOB: February 05, 1961, 60 y.o.   MRN: [id number]  Chief Complaint  Patient presents with   Urinary Tract Infection    Pt states she has been having frequent urination and vaginal discharge for the last few weeks     HPI Patient is in today for frequent urination and vaginal discharge   URINARY SYMPTOMS  Dysuria: no Urinary frequency: yes for about 3 weeks Urgency: yes Small volume voids: yes Symptom severity:  severe Urinary incontinence: no Foul odor:  intermittent Hematuria: no Abdominal pain: no Back pain: yes, chronic Suprapubic pain/pressure:  sometimes Flank pain: no Fever:  no Vomiting: no Relief with cranberry juice:  not taking Relief with pyridium:  not taking Status: worse Previous urinary tract infection: yes Recurrent urinary tract infection: no Sexual activity: No sexually active History of sexually transmitted disease: no Treatments attempted: none    Past Medical History:  Diagnosis Date   Boils    Ear mass    Hemorrhoids    Herpes simplex virus infection    HSV infection    Lumbago with sciatica    OA (osteoarthritis) of knee    Obesity    Overactive bladder    PONV (postoperative nausea and vomiting)    RA (rheumatoid arthritis) ([place])    Sleep apnea    Urge and stress incontinence     Past Surgical History:  Procedure Laterality Date   CHOLECYSTECTOMY  2011   FRACTURE SURGERY Right    as a child   KNEE ARTHROPLASTY Right 07/11/2017   Procedure: COMPUTER ASSISTED TOTAL KNEE ARTHROPLASTY;  Surgeon: Dereck Leep, MD;  Location: [facility] ORS;  Service: Orthopedics;  Laterality: Right;   SLEEVE GASTROPLASTY     TUMOR REMOVAL Right 2010   ear    Family History  Problem Relation Age of Onset   Cancer Mother        liver   Diabetes Mother    Hyperlipidemia Mother    Hypertension Mother    Cirrhosis Father        liver   Hypertension Daughter    Stroke Maternal  Grandmother    Stroke Maternal Grandfather    Heart disease Paternal Grandmother    Diabetes Paternal Grandmother    Heart disease Paternal Grandfather    Diabetes Paternal Grandfather     Social History   Socioeconomic History   Marital status: Single    Spouse name: Not on file   Number of children: Not on file   Years of education: Not on file   Highest education level: Not on file  Occupational History   Not on file  Tobacco Use   Smoking status: Never   Smokeless tobacco: Never  Vaping Use   Vaping Use: Never used  Substance and Sexual Activity   Alcohol use: No   Drug use: No   Sexual activity: Not on file  Other Topics Concern   Not on file  Social History Narrative   Not on file   Social Determinants of Health   Financial Resource Strain: Not on file  Food Insecurity: Not on file  Transportation Needs: Not on file  Physical Activity: Not on file  Stress: Not on file  Social Connections: Not on file  Intimate Partner Violence: Not on file    Outpatient Medications Prior to Visit  Medication Sig Dispense Refill   acyclovir (ZOVIRAX) 400 MG tablet TAKE 1 TABLET  BY MOUTH TWICE A DAY AS NEEDED 180 tablet 0   albuterol (VENTOLIN HFA) 108 (90 Base) MCG/ACT inhaler Inhale 2 puffs into the lungs every 6 (six) hours as needed for wheezing or shortness of breath. 18 g 1   DULoxetine (CYMBALTA) 20 MG capsule Take 2 capsules (40 mg total) by mouth daily. 60 capsule 1   folic acid (FOLVITE) 1 MG tablet Take 1 mg by mouth daily.     methotrexate (RHEUMATREX) 2.5 MG tablet TAKE 6 TABLETS (15 MG) BY MOUTH ONCE A WEEK (SAME DAY EACH WEEK)     pregabalin (LYRICA) 75 MG capsule Take 1 capsule (75 mg total) by mouth 2 (two) times daily. 60 capsule 1   Cholecalciferol (VITAMIN D3) 1.25 MG (50000 UT) CAPS Take 1 capsule by mouth once a week. (Patient not taking: Reported on 01/12/2021) 12 capsule 4   Cyanocobalamin (VITAMIN B-12) 2000 MCG TBCR Take by mouth. (Patient not taking:  Reported on 01/12/2021)     etanercept (ENBREL) 50 MG/ML injection Inject 50 mg into the skin once a week. (Patient not taking: Reported on 01/12/2021)     No facility-administered medications prior to visit.    No Known Allergies  Review of Systems  Constitutional:  Positive for fatigue.  Respiratory: Negative.    Cardiovascular: Negative.   Gastrointestinal:  Positive for abdominal pain (off and on).  Genitourinary:  Positive for decreased urine volume, frequency and urgency. Negative for dysuria and hematuria.  Musculoskeletal:  Positive for back pain.  Skin: Negative.   Neurological: Negative.   Psychiatric/Behavioral: Negative.        Objective:    Physical Exam Vitals and nursing note reviewed.  Constitutional:      General: She is not in acute distress.    Appearance: Normal appearance.  HENT:     Head: Normocephalic.  Eyes:     Conjunctiva/sclera: Conjunctivae normal.  Cardiovascular:     Rate and Rhythm: Normal rate and regular rhythm.     Pulses: Normal pulses.     Heart sounds: Normal heart sounds.  Pulmonary:     Effort: Pulmonary effort is normal.     Breath sounds: Normal breath sounds.  Abdominal:     General: There is no distension.     Palpations: Abdomen is soft.     Tenderness: There is no abdominal tenderness.  Musculoskeletal:     Cervical back: Normal range of motion.  Skin:    General: Skin is warm.  Neurological:     General: No focal deficit present.     Mental Status: She is alert and oriented to person, place, and time.  Psychiatric:        Mood and Affect: Mood normal.        Behavior: Behavior normal.        Thought Content: Thought content normal.        Judgment: Judgment normal.    BP 126/82   Pulse 69   Temp 98.2 F (36.8 C) (Oral)   Wt 279 lb 3.2 oz (126.6 kg)   SpO2 97%   BMI 41.23 kg/m  Wt Readings from Last 3 Encounters:  01/12/21 279 lb 3.2 oz (126.6 kg)  12/25/20 279 lb (126.6 kg)  10/17/20 276 lb 3.2 oz (125.3  kg)    Health Maintenance Due  Topic Date Due   Pneumococcal Vaccine 93-87 Years old (1 - PCV) Never done   Zoster Vaccines- Shingrix (1 of 2) Never done   COLONOSCOPY (Pts 45-27yr Insurance  coverage will need to be confirmed)  08/17/2020   COVID-19 Vaccine (4 - Booster for Moderna series) 11/19/2020   PAP SMEAR-Modifier  12/30/2020    There are no preventive care reminders to display for this patient.   Lab Results  Component Value Date   TSH 2.060 10/17/2020   Lab Results  Component Value Date   WBC 6.9 06/12/2020   HGB 13.7 06/12/2020   HCT 41.1 06/12/2020   MCV 89 06/12/2020   PLT 268 06/12/2020   Lab Results  Component Value Date   NA 141 10/17/2020   K 4.0 10/17/2020   CO2 25 10/17/2020   GLUCOSE 91 10/17/2020   BUN 13 10/17/2020   CREATININE 0.70 10/17/2020   BILITOT 0.4 10/17/2020   ALKPHOS 107 10/17/2020   AST 18 10/17/2020   ALT 19 10/17/2020   PROT 6.9 10/17/2020   ALBUMIN 4.1 10/17/2020   CALCIUM 9.3 10/17/2020   ANIONGAP 10 06/29/2017   EGFR 98 10/17/2020   Lab Results  Component Value Date   CHOL 203 (H) 10/17/2020   Lab Results  Component Value Date   HDL 73 10/17/2020   Lab Results  Component Value Date   LDLCALC 110 (H) 10/17/2020   Lab Results  Component Value Date   TRIG 115 10/17/2020   No results found for: [facility] Lab Results  Component Value Date   HGBA1C 5.8 10/17/2020       Assessment & Plan:   Problem List Items Addressed This Visit       Other   Urinary frequency    U/A negative for UTI. She has been waking up about 4 times a night to urinate. Her glucose and A1C were normal in March 2022. Medication list reviewed and not significant for symptoms. Most likely overactive bladder. Discussed limiting fluids in the evening and performing kegel exercises. Will try these lifestyle modifications and if no improvement will start medication. Follow up in 2 months or sooner with concerns.        Relevant Orders    Urinalysis, Routine w reflex microscopic   Other Visit Diagnoses     Vaginal discharge    -  Primary   wet prep negative   Relevant Orders   WET PREP FOR TRICH, YEAST, CLUE        No orders of the defined types were placed in this encounter.    Charyl Dancer, NP

## 2021-01-12 NOTE — Assessment & Plan Note (Addendum)
U/A negative for UTI. She has been waking up about 4 times a night to urinate. Her glucose and A1C were normal in March 2022. Medication list reviewed and not significant for symptoms. Most likely overactive bladder. Discussed limiting fluids in the evening and performing kegel exercises. Will try these lifestyle modifications and if no improvement will start medication. Follow up in 2 months or sooner with concerns.

## 2021-01-12 NOTE — Patient Instructions (Signed)
It was great to see you!  I have provided information on overactive bladder. You can limit the amount of fluids that you drink after dinner. I also gave you some exercises to help increase your pelvic floor muscles which can help as well.   Let's follow-up in 2 months, sooner if you have concerns.  Take care,  Vance Peper, NP

## 2021-01-15 ENCOUNTER — Other Ambulatory Visit: Admission: RE | Admit: 2021-01-15 | Payer: BC Managed Care – PPO | Source: Ambulatory Visit

## 2021-01-21 ENCOUNTER — Encounter
Admission: RE | Admit: 2021-01-21 | Discharge: 2021-01-21 | Disposition: A | Discharge status: Home / Self Care | Payer: BC Managed Care – PPO | Source: Ambulatory Visit | Attending: Orthopedic Surgery | Admitting: Orthopedic Surgery

## 2021-01-21 ENCOUNTER — Other Ambulatory Visit: Payer: Self-pay

## 2021-01-21 DIAGNOSIS — Z01818 Encounter for other preprocedural examination: Secondary | ICD-10-CM | POA: Insufficient documentation

## 2021-01-21 DIAGNOSIS — Z01810 Encounter for preprocedural cardiovascular examination: Secondary | ICD-10-CM | POA: Diagnosis not present

## 2021-01-21 HISTORY — DX: Prediabetes: R73.03

## 2021-01-21 HISTORY — DX: Gastro-esophageal reflux disease without esophagitis: K21.9

## 2021-01-21 LAB — TYPE AND SCREEN
ABO/RH(D): A POS
Antibody Screen: NEGATIVE

## 2021-01-21 LAB — SEDIMENTATION RATE: Sed Rate: 43 mm/hr — ABNORMAL HIGH (ref 0–30)

## 2021-01-21 LAB — URINALYSIS, ROUTINE W REFLEX MICROSCOPIC
Bacteria, UA: NONE SEEN
Bilirubin Urine: NEGATIVE
Glucose, UA: NEGATIVE mg/dL
Hgb urine dipstick: NEGATIVE
Ketones, ur: NEGATIVE mg/dL
Leukocytes,Ua: NEGATIVE
Nitrite: NEGATIVE
Protein, ur: NEGATIVE mg/dL
Specific Gravity, Urine: 1.023 (ref 1.005–1.030)
pH: 6 (ref 5.0–8.0)

## 2021-01-21 LAB — COMPREHENSIVE METABOLIC PANEL
ALT: 20 U/L (ref 0–44)
AST: 21 U/L (ref 15–41)
Albumin: 3.4 g/dL — ABNORMAL LOW (ref 3.5–5.0)
Alkaline Phosphatase: 76 U/L (ref 38–126)
Anion gap: 5 (ref 5–15)
BUN: 15 mg/dL (ref 8–23)
CO2: 26 mmol/L (ref 22–32)
Calcium: 8.5 mg/dL — ABNORMAL LOW (ref 8.9–10.3)
Chloride: 107 mmol/L (ref 98–111)
Creatinine, Ser: 0.6 mg/dL (ref 0.44–1.00)
GFR, Estimated: >60 mL/min (ref >60)
Glucose, Bld: 96 mg/dL (ref 70–99)
Potassium: 3.7 mmol/L (ref 3.5–5.1)
Sodium: 138 mmol/L (ref 135–145)
Total Bilirubin: 0.8 mg/dL (ref 0.3–1.2)
Total Protein: 6.7 g/dL (ref 6.5–8.1)

## 2021-01-21 LAB — CBC
HCT: 35.8 % — ABNORMAL LOW (ref 36.0–46.0)
Hemoglobin: 12 g/dL (ref 12.0–15.0)
MCH: 30 pg (ref 26.0–34.0)
MCHC: 33.5 g/dL (ref 30.0–36.0)
MCV: 89.5 fL (ref 80.0–100.0)
Platelets: 248 10*3/uL (ref 150–400)
RBC: 4 MIL/uL (ref 3.87–5.11)
RDW: 13.6 % (ref 11.5–15.5)
WBC: 5.8 10*3/uL (ref 4.0–10.5)
nRBC: 0 % (ref 0.0–0.2)

## 2021-01-21 LAB — APTT: aPTT: 24 seconds (ref 24–36)

## 2021-01-21 LAB — PROTIME-INR
INR: 0.9 (ref 0.8–1.2)
Prothrombin Time: 12.6 seconds (ref 11.4–15.2)

## 2021-01-21 LAB — C-REACTIVE PROTEIN: CRP: 0.6 mg/dL (ref <1.0)

## 2021-01-21 LAB — SURGICAL PCR SCREEN
MRSA, PCR: NEGATIVE
Staphylococcus aureus: NEGATIVE

## 2021-01-21 NOTE — Patient Instructions (Addendum)
Your procedure is scheduled on:01-28-21 WEDNESDAY Report to the Registration Desk on the 1st floor of the [facility]-Then proceed to the 2nd floor Surgery Desk in the [facility] To find out your arrival time, please call [phone] between 1PM - 3PM on:01-27-21 TUESDAY  REMEMBER: Instructions that are not followed completely may result in serious medical risk, up to and including death; or upon the discretion of your surgeon and anesthesiologist your surgery may need to be rescheduled.  Do not eat food after midnight the night before surgery.  No gum chewing, lozengers or hard candies.  You may however, drink CLEAR liquids up to 2 hours before you are scheduled to arrive for your surgery. Do not drink anything within 2 hours of your scheduled arrival time.  Clear liquids include: - water  - apple juice without pulp - gatorade - black coffee or tea (Do NOT add milk or creamers to the coffee or tea) Do NOT drink anything that is not on this list.  Type 1 and Type 2 diabetics should only drink water.  In addition, your doctor has ordered for you to drink the provided  Ensure Pre-Surgery Clear Carbohydrate Drink  Drinking this carbohydrate drink up to two hours before surgery helps to reduce insulin resistance and improve patient outcomes. Please complete drinking 2 hours prior to scheduled arrival time.  TAKE THESE MEDICATIONS THE MORNING OF SURGERY WITH A SIP OF WATER: -Lyrica (Pregabalin) -Cymbalta (Duloxetine)  Use your albuterol inhaler the day of surgery and bring inhaler to the hospital  One week prior to surgery: Stop Anti-inflammatories (NSAIDS) such as Advil, Aleve, Ibuprofen, Motrin, Naproxen, Naprosyn and Aspirin based products such as Excedrin, Goodys Powder, BC Powder.You may however, continue to take Tylenol if needed for pain up until the day of surgery.You may continue Celebrex up until the day prior to surgery  Stop [place] supplements/vitamins NOW  (01-21-21) until after surgery.  No Alcohol for 24 hours before or after surgery.  No Smoking including e-cigarettes for 24 hours prior to surgery.  No chewable tobacco products for at least 6 hours prior to surgery.  No nicotine patches on the day of surgery.  Do not use any "recreational" drugs for at least a week prior to your surgery.  Please be advised that the combination of cocaine and anesthesia may have negative outcomes, up to and including death. If you test positive for cocaine, your surgery will be cancelled.  On the morning of surgery brush your teeth with toothpaste and water, you may rinse your mouth with mouthwash if you wish. Do not swallow any toothpaste or mouthwash.  Do not wear jewelry, make-up, hairpins, clips or nail polish.  Do not wear lotions, powders, or perfumes.   Do not shave body from the neck down 48 hours prior to surgery just in case you cut yourself which could leave a site for infection.  Also, freshly shaved skin may become irritated if using the CHG soap.  Contact lenses, hearing aids and dentures may not be worn into surgery.  Do not bring valuables to the hospital. [facility] is not responsible for any missing/lost belongings or valuables.   Use CHG Soap as directed on instruction sheet  Notify your doctor if there is any change in your medical condition (cold, fever, infection).  Wear comfortable clothing (specific to your surgery type) to the hospital.  After surgery, you can help prevent lung complications by doing breathing exercises.  Take deep breaths and cough  every 1-2 hours. Your doctor may order a device called an Incentive Spirometer to help you take deep breaths. When coughing or sneezing, hold a pillow firmly against your incision with both hands. This is called "splinting." Doing this helps protect your incision. It also decreases belly discomfort.  If you are being admitted to the hospital overnight, leave your suitcase in  the car. After surgery it may be brought to your room.  If you are being discharged the day of surgery, you will not be allowed to drive home. You will need a responsible adult (18 years or older) to drive you home and stay with you that night.   If you are taking public transportation, you will need to have a responsible adult (18 years or older) with you. Please confirm with your physician that it is acceptable to use public transportation.   Please call the [place] Dept. at [phone] if you have any questions about these instructions.  Surgery Visitation Policy:  Patients undergoing a surgery or procedure may have one family member or support person with them as long as that person is not COVID-19 positive or experiencing its symptoms.  That person may remain in the waiting area during the procedure.  Inpatient Visitation:    Visiting hours are 7 a.m. to 8 p.m. Inpatients will be allowed two visitors daily. The visitors may change each day during the patient's stay. No visitors under the age of 46. Any visitor under the age of 16 must be accompanied by an adult. The visitor must pass COVID-19 screenings, use hand sanitizer when entering and exiting the patient's room and wear a mask at all times, including in the patient's room. Patients must also wear a mask when staff or their visitor are in the room. Masking is required regardless of vaccination status.

## 2021-01-22 LAB — HEMOGLOBIN A1C
Hgb A1c MFr Bld: 5.6 % (ref 4.8–5.6)
Mean Plasma Glucose: 114.02 mg/dL

## 2021-01-23 LAB — URINE CULTURE
Culture: 30000 — AB
Special Requests: NORMAL

## 2021-01-26 ENCOUNTER — Other Ambulatory Visit
Admission: RE | Admit: 2021-01-26 | Discharge: 2021-01-26 | Disposition: A | Discharge status: Home / Self Care | Payer: BC Managed Care – PPO | Source: Ambulatory Visit | Attending: Obstetrics & Gynecology | Admitting: Obstetrics & Gynecology

## 2021-01-26 ENCOUNTER — Other Ambulatory Visit: Payer: Self-pay

## 2021-01-26 DIAGNOSIS — Z20822 Contact with and (suspected) exposure to covid-19: Secondary | ICD-10-CM | POA: Diagnosis not present

## 2021-01-26 DIAGNOSIS — Z01812 Encounter for preprocedural laboratory examination: Secondary | ICD-10-CM | POA: Diagnosis not present

## 2021-01-26 LAB — SARS CORONAVIRUS 2 (TAT 6-24 HRS): SARS Coronavirus 2: NEGATIVE

## 2021-01-28 ENCOUNTER — Encounter
Admission: RE | Disposition: A | Discharge status: Home / Self Care | Payer: Self-pay | Source: Home / Self Care | Attending: Orthopedic Surgery

## 2021-01-28 ENCOUNTER — Encounter: Payer: Self-pay | Admitting: Orthopedic Surgery

## 2021-01-28 ENCOUNTER — Ambulatory Visit: Payer: BC Managed Care – PPO | Admitting: Urgent Care

## 2021-01-28 ENCOUNTER — Observation Stay
Admission: RE | Admit: 2021-01-28 | Discharge: 2021-01-30 | Disposition: A | Discharge status: Home / Self Care | Payer: BC Managed Care – PPO | Attending: Orthopedic Surgery | Admitting: Orthopedic Surgery

## 2021-01-28 ENCOUNTER — Observation Stay: Payer: BC Managed Care – PPO

## 2021-01-28 ENCOUNTER — Other Ambulatory Visit: Payer: Self-pay

## 2021-01-28 DIAGNOSIS — Z96651 Presence of right artificial knee joint: Secondary | ICD-10-CM | POA: Diagnosis not present

## 2021-01-28 DIAGNOSIS — E119 Type 2 diabetes mellitus without complications: Secondary | ICD-10-CM | POA: Insufficient documentation

## 2021-01-28 DIAGNOSIS — G4733 Obstructive sleep apnea (adult) (pediatric): Secondary | ICD-10-CM | POA: Diagnosis not present

## 2021-01-28 DIAGNOSIS — Z79899 Other long term (current) drug therapy: Secondary | ICD-10-CM | POA: Insufficient documentation

## 2021-01-28 DIAGNOSIS — M1712 Unilateral primary osteoarthritis, left knee: Principal | ICD-10-CM | POA: Insufficient documentation

## 2021-01-28 DIAGNOSIS — Z96659 Presence of unspecified artificial knee joint: Secondary | ICD-10-CM

## 2021-01-28 DIAGNOSIS — Z96652 Presence of left artificial knee joint: Secondary | ICD-10-CM | POA: Diagnosis not present

## 2021-01-28 DIAGNOSIS — Z471 Aftercare following joint replacement surgery: Secondary | ICD-10-CM | POA: Diagnosis not present

## 2021-01-28 HISTORY — PX: KNEE ARTHROPLASTY: SHX992

## 2021-01-28 HISTORY — PX: JOINT REPLACEMENT: SHX530

## 2021-01-28 SURGERY — ARTHROPLASTY, KNEE, TOTAL, USING IMAGELESS COMPUTER-ASSISTED NAVIGATION
Anesthesia: Spinal | Site: Knee | Laterality: Left

## 2021-01-28 MED ORDER — DEXAMETHASONE SODIUM PHOSPHATE 10 MG/ML IJ SOLN
INTRAMUSCULAR | Status: AC
  Administered 2021-01-28: 8 mg via INTRAVENOUS
  Filled 2021-01-28: qty 1

## 2021-01-28 MED ORDER — PHENYLEPHRINE HCL (PRESSORS) 10 MG/ML IV SOLN
INTRAVENOUS | Status: DC | PRN
  Administered 2021-01-28: 100 ug via INTRAVENOUS

## 2021-01-28 MED ORDER — PROPOFOL 10 MG/ML IV BOLUS
INTRAVENOUS | Status: AC
  Filled 2021-01-28: qty 20

## 2021-01-28 MED ORDER — ACYCLOVIR 200 MG PO CAPS
400.0000 mg | ORAL_CAPSULE | Freq: Two times a day (BID) | ORAL | Status: DC | PRN
  Filled 2021-01-28: qty 2

## 2021-01-28 MED ORDER — PREGABALIN 75 MG PO CAPS
75.0000 mg | ORAL_CAPSULE | Freq: Two times a day (BID) | ORAL | Status: DC
Start: 2021-01-28 — End: 2021-01-30
  Administered 2021-01-28 – 2021-01-30 (×4): 75 mg via ORAL
  Filled 2021-01-28 (×4): qty 1

## 2021-01-28 MED ORDER — FENTANYL CITRATE (PF) 100 MCG/2ML IJ SOLN
INTRAMUSCULAR | Status: DC | PRN
  Administered 2021-01-28 (×3): 50 ug via INTRAVENOUS

## 2021-01-28 MED ORDER — MAGNESIUM HYDROXIDE 400 MG/5ML PO SUSP
30.0000 mL | Freq: Every day | ORAL | Status: DC
  Administered 2021-01-30: 30 mL via ORAL
  Filled 2021-01-28 (×3): qty 30

## 2021-01-28 MED ORDER — APREPITANT 40 MG PO CAPS
ORAL_CAPSULE | ORAL | Status: AC
  Administered 2021-01-28: 40 mg via ORAL
  Filled 2021-01-28: qty 1

## 2021-01-28 MED ORDER — CHLORHEXIDINE GLUCONATE 4 % EX LIQD: 60.0000 mL | Freq: Once | CUTANEOUS | Status: DC

## 2021-01-28 MED ORDER — VITAMIN B-12 1000 MCG PO TABS
2000.0000 ug | ORAL_TABLET | Freq: Every day | ORAL | Status: DC
  Administered 2021-01-29 – 2021-01-30 (×2): 2000 ug via ORAL
  Filled 2021-01-28 (×2): qty 2

## 2021-01-28 MED ORDER — CHLORHEXIDINE GLUCONATE 0.12 % MT SOLN: 15.0000 mL | Freq: Once | OROMUCOSAL | Status: AC

## 2021-01-28 MED ORDER — TRANEXAMIC ACID-NACL 1000-0.7 MG/100ML-% IV SOLN: 1000.0000 mg | Freq: Once | INTRAVENOUS | Status: AC

## 2021-01-28 MED ORDER — FENTANYL CITRATE (PF) 100 MCG/2ML IJ SOLN
INTRAMUSCULAR | Status: AC
  Filled 2021-01-28: qty 2

## 2021-01-28 MED ORDER — FERROUS SULFATE 325 (65 FE) MG PO TABS
325.0000 mg | ORAL_TABLET | Freq: Two times a day (BID) | ORAL | Status: DC
  Administered 2021-01-29 – 2021-01-30 (×3): 325 mg via ORAL
  Filled 2021-01-28 (×3): qty 1

## 2021-01-28 MED ORDER — CELECOXIB 200 MG PO CAPS
200.0000 mg | ORAL_CAPSULE | Freq: Two times a day (BID) | ORAL | Status: DC
  Administered 2021-01-28 – 2021-01-30 (×4): 200 mg via ORAL
  Filled 2021-01-28 (×4): qty 1

## 2021-01-28 MED ORDER — TRANEXAMIC ACID-NACL 1000-0.7 MG/100ML-% IV SOLN
INTRAVENOUS | Status: AC
  Administered 2021-01-28: 1000 mg via INTRAVENOUS
  Filled 2021-01-28: qty 100

## 2021-01-28 MED ORDER — ACETAMINOPHEN 10 MG/ML IV SOLN
INTRAVENOUS | Status: AC
  Filled 2021-01-28: qty 100

## 2021-01-28 MED ORDER — CELECOXIB 200 MG PO CAPS
ORAL_CAPSULE | ORAL | Status: AC
  Administered 2021-01-28: 400 mg via ORAL
  Filled 2021-01-28: qty 2

## 2021-01-28 MED ORDER — SODIUM CHLORIDE 0.9 % IV SOLN
INTRAVENOUS | Status: DC | PRN
  Administered 2021-01-28: 20 ug/min via INTRAVENOUS

## 2021-01-28 MED ORDER — TRANEXAMIC ACID-NACL 1000-0.7 MG/100ML-% IV SOLN
1000.0000 mg | INTRAVENOUS | Status: AC
  Administered 2021-01-28: 1000 mg via INTRAVENOUS

## 2021-01-28 MED ORDER — FENTANYL CITRATE (PF) 100 MCG/2ML IJ SOLN: 25.0000 ug | INTRAMUSCULAR | Status: DC | PRN

## 2021-01-28 MED ORDER — ACETAMINOPHEN 10 MG/ML IV SOLN
1000.0000 mg | Freq: Four times a day (QID) | INTRAVENOUS | Status: AC
  Administered 2021-01-28 – 2021-01-29 (×4): 1000 mg via INTRAVENOUS
  Filled 2021-01-28 (×4): qty 100

## 2021-01-28 MED ORDER — LACTATED RINGERS IV SOLN: INTRAVENOUS | Status: DC

## 2021-01-28 MED ORDER — METHOTREXATE 2.5 MG PO TABS: 2.5000 mg | ORAL_TABLET | ORAL | Status: DC

## 2021-01-28 MED ORDER — OXYCODONE HCL 5 MG PO TABS
10.0000 mg | ORAL_TABLET | ORAL | Status: DC | PRN
  Administered 2021-01-28 – 2021-01-29 (×2): 10 mg via ORAL
  Filled 2021-01-28 (×2): qty 2

## 2021-01-28 MED ORDER — KETAMINE HCL 50 MG/5ML IJ SOSY
PREFILLED_SYRINGE | INTRAMUSCULAR | Status: AC
  Filled 2021-01-28: qty 5

## 2021-01-28 MED ORDER — DEXMEDETOMIDINE (PRECEDEX) IN NS 20 MCG/5ML (4 MCG/ML) IV SYRINGE
PREFILLED_SYRINGE | INTRAVENOUS | Status: DC | PRN
  Administered 2021-01-28: 20 ug via INTRAVENOUS

## 2021-01-28 MED ORDER — PROPOFOL 10 MG/ML IV BOLUS
INTRAVENOUS | Status: AC
  Filled 2021-01-28: qty 40

## 2021-01-28 MED ORDER — BUPIVACAINE HCL (PF) 0.25 % IJ SOLN
INTRAMUSCULAR | Status: DC | PRN
  Administered 2021-01-28: 60 mL

## 2021-01-28 MED ORDER — BUPIVACAINE LIPOSOME 1.3 % IJ SUSP
INTRAMUSCULAR | Status: AC
  Filled 2021-01-28: qty 20

## 2021-01-28 MED ORDER — SODIUM CHLORIDE FLUSH 0.9 % IV SOLN
INTRAVENOUS | Status: AC
  Filled 2021-01-28: qty 40

## 2021-01-28 MED ORDER — BUPIVACAINE HCL (PF) 0.5 % IJ SOLN
INTRAMUSCULAR | Status: DC | PRN
  Administered 2021-01-28: 3 mL via INTRATHECAL

## 2021-01-28 MED ORDER — PANTOPRAZOLE SODIUM 40 MG PO TBEC
40.0000 mg | DELAYED_RELEASE_TABLET | Freq: Two times a day (BID) | ORAL | Status: DC
  Administered 2021-01-28 – 2021-01-30 (×4): 40 mg via ORAL
  Filled 2021-01-28 (×4): qty 1

## 2021-01-28 MED ORDER — VITAMIN D3 1.25 MG (50000 UT) PO CAPS: 1.0000 | ORAL_CAPSULE | ORAL | Status: DC

## 2021-01-28 MED ORDER — TRAMADOL HCL 50 MG PO TABS
50.0000 mg | ORAL_TABLET | ORAL | Status: DC | PRN
  Administered 2021-01-29 – 2021-01-30 (×3): 100 mg via ORAL
  Filled 2021-01-28 (×3): qty 2

## 2021-01-28 MED ORDER — ALBUTEROL SULFATE HFA 108 (90 BASE) MCG/ACT IN AERS: 2.0000 | INHALATION_SPRAY | Freq: Four times a day (QID) | RESPIRATORY_TRACT | Status: DC | PRN

## 2021-01-28 MED ORDER — CHLORHEXIDINE GLUCONATE 0.12 % MT SOLN
OROMUCOSAL | Status: AC
  Administered 2021-01-28: 15 mL via OROMUCOSAL
  Filled 2021-01-28: qty 15

## 2021-01-28 MED ORDER — GABAPENTIN 300 MG PO CAPS: 300.0000 mg | ORAL_CAPSULE | Freq: Once | ORAL | Status: AC

## 2021-01-28 MED ORDER — BUPIVACAINE HCL (PF) 0.5 % IJ SOLN
INTRAMUSCULAR | Status: AC
  Filled 2021-01-28: qty 10

## 2021-01-28 MED ORDER — DIPHENHYDRAMINE HCL 12.5 MG/5ML PO ELIX
12.5000 mg | ORAL_SOLUTION | ORAL | Status: DC | PRN
Start: 2021-01-28 — End: 2021-01-30

## 2021-01-28 MED ORDER — ACETAMINOPHEN 10 MG/ML IV SOLN
INTRAVENOUS | Status: DC | PRN
  Administered 2021-01-28: 1000 mg via INTRAVENOUS

## 2021-01-28 MED ORDER — GLYCOPYRROLATE 0.2 MG/ML IJ SOLN
INTRAMUSCULAR | Status: DC | PRN
  Administered 2021-01-28: .2 mg via INTRAVENOUS

## 2021-01-28 MED ORDER — SODIUM CHLORIDE 0.9 % IV SOLN: INTRAVENOUS | Status: DC

## 2021-01-28 MED ORDER — ONDANSETRON HCL 4 MG/2ML IJ SOLN: 4.0000 mg | Freq: Once | INTRAMUSCULAR | Status: DC | PRN

## 2021-01-28 MED ORDER — ALUM & MAG HYDROXIDE-SIMETH 200-200-20 MG/5ML PO SUSP: 30.0000 mL | ORAL | Status: DC | PRN

## 2021-01-28 MED ORDER — LIDOCAINE HCL (PF) 2 % IJ SOLN
INTRAMUSCULAR | Status: AC
  Filled 2021-01-28: qty 5

## 2021-01-28 MED ORDER — MIDAZOLAM HCL 2 MG/2ML IJ SOLN
INTRAMUSCULAR | Status: AC
  Filled 2021-01-28: qty 2

## 2021-01-28 MED ORDER — SENNOSIDES-DOCUSATE SODIUM 8.6-50 MG PO TABS
1.0000 | ORAL_TABLET | Freq: Two times a day (BID) | ORAL | Status: DC
  Administered 2021-01-28 – 2021-01-30 (×4): 1 via ORAL
  Filled 2021-01-28 (×4): qty 1

## 2021-01-28 MED ORDER — ACETAMINOPHEN 325 MG PO TABS
325.0000 mg | ORAL_TABLET | Freq: Four times a day (QID) | ORAL | Status: DC | PRN
Start: 2021-01-29 — End: 2021-01-30

## 2021-01-28 MED ORDER — PROPOFOL 500 MG/50ML IV EMUL
INTRAVENOUS | Status: DC | PRN
  Administered 2021-01-28: 35 ug/kg/min via INTRAVENOUS

## 2021-01-28 MED ORDER — PHENOL 1.4 % MT LIQD
1.0000 | OROMUCOSAL | Status: DC | PRN
  Filled 2021-01-28: qty 177

## 2021-01-28 MED ORDER — DEXAMETHASONE SODIUM PHOSPHATE 10 MG/ML IJ SOLN: 8.0000 mg | Freq: Once | INTRAMUSCULAR | Status: AC

## 2021-01-28 MED ORDER — APREPITANT 40 MG PO CAPS: 40.0000 mg | ORAL_CAPSULE | Freq: Once | ORAL | Status: AC

## 2021-01-28 MED ORDER — TRANEXAMIC ACID-NACL 1000-0.7 MG/100ML-% IV SOLN
INTRAVENOUS | Status: AC
  Filled 2021-01-28: qty 100

## 2021-01-28 MED ORDER — LIDOCAINE HCL URETHRAL/MUCOSAL 2 % EX GEL
CUTANEOUS | Status: AC
  Filled 2021-01-28: qty 5

## 2021-01-28 MED ORDER — ONDANSETRON HCL 4 MG/2ML IJ SOLN
4.0000 mg | Freq: Four times a day (QID) | INTRAMUSCULAR | Status: DC | PRN
  Administered 2021-01-28: 4 mg via INTRAVENOUS
  Filled 2021-01-28: qty 2

## 2021-01-28 MED ORDER — PHENYLEPHRINE HCL (PRESSORS) 10 MG/ML IV SOLN
INTRAVENOUS | Status: AC
  Filled 2021-01-28: qty 1

## 2021-01-28 MED ORDER — DULOXETINE HCL 30 MG PO CPEP
30.0000 mg | ORAL_CAPSULE | Freq: Every day | ORAL | Status: DC
  Administered 2021-01-29 – 2021-01-30 (×2): 30 mg via ORAL
  Filled 2021-01-28 (×2): qty 1

## 2021-01-28 MED ORDER — PROPOFOL 10 MG/ML IV BOLUS
INTRAVENOUS | Status: DC | PRN
  Administered 2021-01-28 (×2): 10 mg via INTRAVENOUS
  Administered 2021-01-28: 20 mg via INTRAVENOUS

## 2021-01-28 MED ORDER — KETAMINE HCL 50 MG/ML IJ SOLN
INTRAMUSCULAR | Status: DC | PRN
  Administered 2021-01-28: 30 mg via INTRAVENOUS
  Administered 2021-01-28: 20 mg via INTRAVENOUS

## 2021-01-28 MED ORDER — FAMOTIDINE 20 MG PO TABS: 20.0000 mg | ORAL_TABLET | Freq: Once | ORAL | Status: AC

## 2021-01-28 MED ORDER — GABAPENTIN 300 MG PO CAPS
ORAL_CAPSULE | ORAL | Status: AC
  Administered 2021-01-28: 300 mg via ORAL
  Filled 2021-01-28: qty 1

## 2021-01-28 MED ORDER — CELECOXIB 200 MG PO CAPS: 400.0000 mg | ORAL_CAPSULE | Freq: Once | ORAL | Status: AC

## 2021-01-28 MED ORDER — ENOXAPARIN SODIUM 30 MG/0.3ML IJ SOSY
30.0000 mg | PREFILLED_SYRINGE | Freq: Two times a day (BID) | INTRAMUSCULAR | Status: DC
  Administered 2021-01-29 – 2021-01-30 (×3): 30 mg via SUBCUTANEOUS
  Filled 2021-01-28 (×3): qty 0.3

## 2021-01-28 MED ORDER — FAMOTIDINE 20 MG PO TABS
ORAL_TABLET | ORAL | Status: AC
  Administered 2021-01-28: 20 mg via ORAL
  Filled 2021-01-28: qty 1

## 2021-01-28 MED ORDER — BUPIVACAINE HCL (PF) 0.25 % IJ SOLN
INTRAMUSCULAR | Status: AC
  Filled 2021-01-28: qty 60

## 2021-01-28 MED ORDER — CELECOXIB 200 MG PO CAPS: 200.0000 mg | ORAL_CAPSULE | Freq: Two times a day (BID) | ORAL | Status: DC

## 2021-01-28 MED ORDER — ONDANSETRON HCL 4 MG PO TABS: 4.0000 mg | ORAL_TABLET | Freq: Four times a day (QID) | ORAL | Status: DC | PRN

## 2021-01-28 MED ORDER — CEFAZOLIN SODIUM-DEXTROSE 2-4 GM/100ML-% IV SOLN
2.0000 g | Freq: Four times a day (QID) | INTRAVENOUS | Status: AC
  Administered 2021-01-29: 2 g via INTRAVENOUS
  Filled 2021-01-28 (×2): qty 100

## 2021-01-28 MED ORDER — 0.9 % SODIUM CHLORIDE (POUR BTL) OPTIME
TOPICAL | Status: DC | PRN
  Administered 2021-01-28: 1000 mL

## 2021-01-28 MED ORDER — SODIUM CHLORIDE 0.9 % IV SOLN
INTRAVENOUS | Status: DC | PRN
  Administered 2021-01-28: 60 mL

## 2021-01-28 MED ORDER — METOCLOPRAMIDE HCL 10 MG PO TABS
10.0000 mg | ORAL_TABLET | Freq: Three times a day (TID) | ORAL | Status: DC
  Administered 2021-01-28 – 2021-01-30 (×7): 10 mg via ORAL
  Filled 2021-01-28 (×7): qty 1

## 2021-01-28 MED ORDER — OXYCODONE HCL 5 MG PO TABS: 5.0000 mg | ORAL_TABLET | ORAL | Status: DC | PRN

## 2021-01-28 MED ORDER — MENTHOL 3 MG MT LOZG
1.0000 | LOZENGE | OROMUCOSAL | Status: DC | PRN
  Filled 2021-01-28: qty 9

## 2021-01-28 MED ORDER — CEFAZOLIN SODIUM 1 G IJ SOLR
INTRAMUSCULAR | Status: AC
  Filled 2021-01-28: qty 10

## 2021-01-28 MED ORDER — DEXMEDETOMIDINE (PRECEDEX) IN NS 20 MCG/5ML (4 MCG/ML) IV SYRINGE
PREFILLED_SYRINGE | INTRAVENOUS | Status: AC
  Filled 2021-01-28: qty 5

## 2021-01-28 MED ORDER — CEFAZOLIN SODIUM-DEXTROSE 2-4 GM/100ML-% IV SOLN
2.0000 g | INTRAVENOUS | Status: AC
Start: 2021-01-28 — End: 2021-01-28
  Administered 2021-01-28: 3 g via INTRAVENOUS

## 2021-01-28 MED ORDER — ORAL CARE MOUTH RINSE: 15.0000 mL | Freq: Once | OROMUCOSAL | Status: AC

## 2021-01-28 MED ORDER — GLYCOPYRROLATE 0.2 MG/ML IJ SOLN
INTRAMUSCULAR | Status: AC
  Filled 2021-01-28: qty 1

## 2021-01-28 MED ORDER — HYDROMORPHONE HCL 1 MG/ML IJ SOLN
0.5000 mg | INTRAMUSCULAR | Status: DC | PRN
  Administered 2021-01-29: 1 mg via INTRAVENOUS
  Filled 2021-01-28: qty 1

## 2021-01-28 MED ORDER — FOLIC ACID 1 MG PO TABS
1.0000 mg | ORAL_TABLET | Freq: Every day | ORAL | Status: DC
  Administered 2021-01-29 – 2021-01-30 (×2): 1 mg via ORAL
  Filled 2021-01-28 (×2): qty 1

## 2021-01-28 MED ORDER — FLEET ENEMA 7-19 GM/118ML RE ENEM: 1.0000 | ENEMA | Freq: Once | RECTAL | Status: DC | PRN

## 2021-01-28 MED ORDER — ALBUTEROL SULFATE (2.5 MG/3ML) 0.083% IN NEBU: 2.5000 mg | INHALATION_SOLUTION | Freq: Four times a day (QID) | RESPIRATORY_TRACT | Status: DC | PRN

## 2021-01-28 MED ORDER — BISACODYL 10 MG RE SUPP: 10.0000 mg | Freq: Every day | RECTAL | Status: DC | PRN

## 2021-01-28 MED ORDER — CEFAZOLIN SODIUM-DEXTROSE 2-4 GM/100ML-% IV SOLN
INTRAVENOUS | Status: AC
  Administered 2021-01-28: 2 g via INTRAVENOUS
  Filled 2021-01-28: qty 100

## 2021-01-28 MED ORDER — BUPIVACAINE HCL (PF) 0.5 % IJ SOLN: INTRAMUSCULAR | Status: DC | PRN

## 2021-01-28 MED ORDER — MIDAZOLAM HCL 5 MG/5ML IJ SOLN
INTRAMUSCULAR | Status: DC | PRN
  Administered 2021-01-28 (×2): 2 mg via INTRAVENOUS

## 2021-01-28 SURGICAL SUPPLY — 74 items
ATTUNE MED DOME PAT 38 KNEE (Knees) ×1 IMPLANT
ATTUNE PS FEM LT SZ 5 CEM KNEE (Femur) ×1 IMPLANT
ATTUNE PSRP INSR SZ5 8 KNEE (Insert) ×1 IMPLANT
BASE TIBIAL ROT PLAT SZ 5 KNEE (Knees) IMPLANT
BATTERY INSTRU NAVIGATION (MISCELLANEOUS) ×8 IMPLANT
BLADE SAW 70X12.5 (BLADE) ×2 IMPLANT
BLADE SAW 90X13X1.19 OSCILLAT (BLADE) ×2 IMPLANT
BLADE SAW 90X25X1.19 OSCILLAT (BLADE) ×2 IMPLANT
BONE CEMENT GENTAMICIN (Cement) ×4 IMPLANT
CEMENT BONE GENTAMICIN 40 (Cement) IMPLANT
COOLER POLAR GLACIER W/PUMP (MISCELLANEOUS) ×2 IMPLANT
CUFF TOURN SGL QUICK 24 (TOURNIQUET CUFF)
CUFF TOURN SGL QUICK 34 (TOURNIQUET CUFF)
CUFF TRNQT CYL 24X4X16.5-23 (TOURNIQUET CUFF) IMPLANT
CUFF TRNQT CYL 34X4.125X (TOURNIQUET CUFF) IMPLANT
DRAPE 3/4 80X56 (DRAPES) ×2 IMPLANT
DRSG DERMACEA 8X12 NADH (GAUZE/BANDAGES/DRESSINGS) ×2 IMPLANT
DRSG MEPILEX SACRM 8.7X9.8 (GAUZE/BANDAGES/DRESSINGS) ×2 IMPLANT
DRSG OPSITE POSTOP 4X14 (GAUZE/BANDAGES/DRESSINGS) ×2 IMPLANT
DRSG TEGADERM 4X4.75 (GAUZE/BANDAGES/DRESSINGS) ×2 IMPLANT
DURAPREP 26ML APPLICATOR (WOUND CARE) ×4 IMPLANT
ELECT CAUTERY BLADE 6.4 (BLADE) ×2 IMPLANT
ELECT REM PT RETURN 9FT ADLT (ELECTROSURGICAL) ×2
ELECTRODE REM PT RTRN 9FT ADLT (ELECTROSURGICAL) ×1 IMPLANT
EX-PIN ORTHOLOCK NAV 4X150 (PIN) ×4 IMPLANT
GAUZE 4X4 16PLY ~~LOC~~+RFID DBL (SPONGE) ×2 IMPLANT
GLOVE SURG ENC MOIS LTX SZ7.5 (GLOVE) ×4 IMPLANT
GLOVE SURG ENC TEXT LTX SZ7.5 (GLOVE) ×4 IMPLANT
GLOVE SURG UNDER LTX SZ8 (GLOVE) ×2 IMPLANT
GLOVE SURG UNDER POLY LF SZ7.5 (GLOVE) ×2 IMPLANT
GOWN STRL REUS W/ TWL LRG LVL3 (GOWN DISPOSABLE) ×2 IMPLANT
GOWN STRL REUS W/ TWL XL LVL3 (GOWN DISPOSABLE) ×1 IMPLANT
GOWN STRL REUS W/TWL LRG LVL3 (GOWN DISPOSABLE) ×2
GOWN STRL REUS W/TWL XL LVL3 (GOWN DISPOSABLE) ×1
HEMOVAC 400CC 10FR (MISCELLANEOUS) ×2 IMPLANT
HOLDER FOLEY CATH W/STRAP (MISCELLANEOUS) ×2 IMPLANT
HOOD PEEL AWAY FLYTE STAYCOOL (MISCELLANEOUS) ×4 IMPLANT
IRRIGATION SURGIPHOR STRL (IV SOLUTION) ×2 IMPLANT
IV NS IRRIG 3000ML ARTHROMATIC (IV SOLUTION) ×2 IMPLANT
KIT TURNOVER KIT A (KITS) ×2 IMPLANT
KNIFE SCULPS 14X20 (INSTRUMENTS) ×2 IMPLANT
LABEL OR SOLS (LABEL) ×2 IMPLANT
MANIFOLD NEPTUNE II (INSTRUMENTS) ×4 IMPLANT
NDL SAFETY ECLIPSE 18X1.5 (NEEDLE) ×1 IMPLANT
NDL SPNL 20GX3.5 QUINCKE YW (NEEDLE) ×2 IMPLANT
NEEDLE HYPO 18GX1.5 SHARP (NEEDLE) ×1
NEEDLE SPNL 20GX3.5 QUINCKE YW (NEEDLE) ×4 IMPLANT
NS IRRIG 500ML POUR BTL (IV SOLUTION) ×2 IMPLANT
PACK TOTAL KNEE (MISCELLANEOUS) ×2 IMPLANT
PAD WRAPON POLAR KNEE (MISCELLANEOUS) ×1 IMPLANT
PENCIL SMOKE EVACUATOR COATED (MISCELLANEOUS) ×2 IMPLANT
PIN FIXATION 1/8DIA X 3INL (PIN) ×6 IMPLANT
PULSAVAC PLUS IRRIG FAN TIP (DISPOSABLE) ×2
SOL PREP PVP 2OZ (MISCELLANEOUS) ×2
SOLUTION PREP PVP 2OZ (MISCELLANEOUS) ×1 IMPLANT
SPONGE DRAIN TRACH 4X4 STRL 2S (GAUZE/BANDAGES/DRESSINGS) ×2 IMPLANT
SPONGE T-LAP 18X18 ~~LOC~~+RFID (SPONGE) ×6 IMPLANT
SPONGE T-LAP 18X36 ~~LOC~~+RFID STR (SPONGE) IMPLANT
STAPLER SKIN PROX 35W (STAPLE) ×2 IMPLANT
STOCKINETTE IMPERV 14X48 (MISCELLANEOUS) IMPLANT
STRAP TIBIA SHORT (MISCELLANEOUS) ×2 IMPLANT
SUCTION FRAZIER HANDLE 10FR (MISCELLANEOUS) ×1
SUCTION TUBE FRAZIER 10FR DISP (MISCELLANEOUS) ×1 IMPLANT
SUT VIC AB 0 CT1 36 (SUTURE) ×4 IMPLANT
SUT VIC AB 1 CT1 36 (SUTURE) ×4 IMPLANT
SUT VIC AB 2-0 CT2 27 (SUTURE) ×2 IMPLANT
SYR 20ML LL LF (SYRINGE) ×2 IMPLANT
SYR 30ML LL (SYRINGE) ×4 IMPLANT
TIBIAL BASE ROT PLAT SZ 5 KNEE (Knees) ×2 IMPLANT
TIP FAN IRRIG PULSAVAC PLUS (DISPOSABLE) ×1 IMPLANT
TOWEL OR 17X26 4PK STRL BLUE (TOWEL DISPOSABLE) ×2 IMPLANT
TOWER CARTRIDGE SMART MIX (DISPOSABLE) ×2 IMPLANT
TRAY FOLEY MTR SLVR 16FR STAT (SET/KITS/TRAYS/PACK) ×2 IMPLANT
WRAPON POLAR PAD KNEE (MISCELLANEOUS) ×2

## 2021-01-28 NOTE — Anesthesia Preprocedure Evaluation (Signed)
Anesthesia Evaluation  Patient identified by MRN, date of birth, ID band Patient awake    Reviewed: Allergy & Precautions, H&P , NPO status , Patient's Chart, lab work & pertinent test results, reviewed documented beta blocker date and time   History of Anesthesia Complications (+) PONV and history of anesthetic complications  Airway Mallampati: II   Neck ROM: full    Dental  (+) Poor Dentition   Pulmonary sleep apnea ,    Pulmonary exam normal        Cardiovascular Exercise Tolerance: Poor hypertension, Normal cardiovascular exam Rhythm:regular Rate:Normal     Neuro/Psych PSYCHIATRIC DISORDERS Depression  Neuromuscular disease    GI/Hepatic Neg liver ROS, GERD  Medicated,  Endo/Other  Morbid obesity  Renal/GU negative Renal ROS  negative genitourinary   Musculoskeletal   Abdominal   Peds  Hematology negative hematology ROS (+)   Anesthesia Other Findings Past Medical History: No date: Boils No date: Ear mass No date: GERD (gastroesophageal reflux disease) No date: Hemorrhoids No date: Herpes simplex virus infection No date: HSV infection No date: Lumbago with sciatica No date: OA (osteoarthritis) of knee No date: Obesity No date: Overactive bladder No date: PONV (postoperative nausea and vomiting) No date: Pre-diabetes No date: RA (rheumatoid arthritis) ([place]) No date: Sleep apnea     Comment:  does not use cpap-had bariatric surgery and has no               issues since surgery No date: Urge and stress incontinence Past Surgical History: 2011: CHOLECYSTECTOMY No date: FRACTURE SURGERY; Right     Comment:  ankle as a child 07/11/2017: KNEE ARTHROPLASTY; Right     Comment:  Procedure: COMPUTER ASSISTED TOTAL KNEE ARTHROPLASTY;                Surgeon: Dereck Leep, MD;  Location: [facility] ORS;                Service: Orthopedics;  Laterality: Right; No date: SLEEVE GASTROPLASTY 2010: TUMOR REMOVAL;  Right     Comment:  ear BMI    Body Mass Index: 41.20 kg/m     Reproductive/Obstetrics negative OB ROS                             Anesthesia Physical Anesthesia Plan  ASA: 3  Anesthesia Plan: General   Post-op Pain Management:    Induction:   PONV Risk Score and Plan:   Airway Management Planned:   Additional Equipment:   Intra-op Plan:   Post-operative Plan:   Informed Consent: I have reviewed the patients History and Physical, chart, labs and discussed the procedure including the risks, benefits and alternatives for the proposed anesthesia with the patient or authorized representative who has indicated his/her understanding and acceptance.     Dental Advisory Given  Plan Discussed with: CRNA  Anesthesia Plan Comments:         Anesthesia Quick Evaluation

## 2021-01-28 NOTE — Transfer of Care (Signed)
Immediate Anesthesia Transfer of Care Note  Patient: Erin Contreras  Procedure(s) Performed: COMPUTER ASSISTED TOTAL KNEE ARTHROPLASTY (Left: Knee)  Patient Location: PACU  Anesthesia Type:Spinal  Level of Consciousness: sedated  Airway & Oxygen Therapy: Patient Spontanous Breathing and Patient connected to face mask oxygen  Post-op Assessment: Report given to RN and Post -op Vital signs reviewed and stable  Post vital signs: Reviewed and stable  Last Vitals:  Vitals Value Taken Time  BP 106/77 01/28/21 1639  Temp 35.8 C 01/28/21 1638  Pulse 74 01/28/21 1640  Resp 19 01/28/21 1640  SpO2 100 % 01/28/21 1640  Vitals shown include unvalidated device data.  Last Pain:  Vitals:   01/28/21 0949  TempSrc: Oral  PainSc: 0-No pain      Patients Stated Pain Goal: 0 (79/89/21 1941)  Complications: No notable events documented.

## 2021-01-28 NOTE — H&P (Signed)
The patient has been re-examined, and the chart reviewed, and there have been no interval changes to the documented history and physical.    The risks, benefits, and alternatives have been discussed at length. The patient expressed understanding of the risks benefits and agreed with plans for surgical intervention.  Annalina Needles P. Maanya Hippert, Jr. M.D.    

## 2021-01-28 NOTE — Anesthesia Procedure Notes (Signed)
Spinal  Patient location during procedure: OR Start time: 01/28/2021 12:15 PM End time: 01/28/2021 12:27 PM Reason for block: surgical anesthesia Staffing Performed: resident/CRNA  Anesthesiologist: Molli Barrows, MD Resident/CRNA: Rolla Plate, CRNA Preanesthetic Checklist Completed: patient identified, IV checked, site marked, risks and benefits discussed, surgical consent, monitors and equipment checked, pre-op evaluation and timeout performed Spinal Block Patient position: sitting Prep: ChloraPrep and site prepped and draped Patient monitoring: heart rate, continuous pulse ox, blood pressure and cardiac monitor Approach: midline Location: L4-5 Injection technique: single-shot Needle Needle type: Quincke  Needle gauge: 22 G Needle length: 12.7 cm Assessment Events: CSF return Additional Notes Negative paresthesia. Negative blood return. Positive free-flowing CSF. Expiration date of kit checked and confirmed. Patient tolerated procedure well, without complications.

## 2021-01-28 NOTE — Op Note (Signed)
OPERATIVE NOTE  DATE OF SURGERY:  01/28/2021  PATIENT NAME:  Erin Contreras   DOB: [birth year]  MRN: [id number]  PRE-OPERATIVE DIAGNOSIS: Degenerative arthrosis of the left knee, primary  POST-OPERATIVE DIAGNOSIS:  Same  PROCEDURE:  Left total knee arthroplasty using computer-assisted navigation  SURGEON:  Marciano Sequin. M.D.  ASSISTANT: Cassell Smiles, PA-C (present and scrubbed throughout the case, critical for assistance with exposure, retraction, instrumentation, and closure)  ANESTHESIA: spinal  ESTIMATED BLOOD LOSS: 50 mL  FLUIDS REPLACED: 1600 mL of crystalloid  TOURNIQUET TIME: 105 minutes  DRAINS: 2 medium Hemovac drains  SOFT TISSUE RELEASES: Anterior cruciate ligament, posterior cruciate ligament, deep medial collateral ligament, patellofemoral ligament  IMPLANTS UTILIZED: DePuy Attune size 5 posterior stabilized femoral component (cemented), size 5 rotating platform tibial component (cemented), 38 mm medialized dome patella (cemented), and a 8 mm stabilized rotating platform polyethylene insert.  INDICATIONS FOR SURGERY: Erin Contreras is a 60 y.o. year old female with a long history of progressive knee pain. X-rays demonstrated severe degenerative changes in tricompartmental fashion. The patient had not seen any significant improvement despite conservative nonsurgical intervention. After discussion of the risks and benefits of surgical intervention, the patient expressed understanding of the risks benefits and agree with plans for total knee arthroplasty.   The risks, benefits, and alternatives were discussed at length including but not limited to the risks of infection, bleeding, nerve injury, stiffness, blood clots, the need for revision surgery, cardiopulmonary complications, among others, and they were willing to proceed.  PROCEDURE IN DETAIL: The patient was brought into the operating room and, after adequate spinal anesthesia was achieved, a tourniquet was placed  on the patient's upper thigh. The patient's knee and leg were cleaned and prepped with alcohol and DuraPrep and draped in the usual sterile fashion. A "timeout" was performed as per usual protocol. The lower extremity was exsanguinated using an Esmarch, and the tourniquet was inflated to 300 mmHg. An anterior longitudinal incision was made followed by a standard mid vastus approach. The deep fibers of the medial collateral ligament were elevated in a subperiosteal fashion off of the medial flare of the tibia so as to maintain a continuous soft tissue sleeve. The patella was subluxed laterally and the patellofemoral ligament was incised. Inspection of the knee demonstrated severe degenerative changes with full-thickness loss of articular cartilage. Osteophytes were debrided using a rongeur. Anterior and posterior cruciate ligaments were excised. Two 4.0 mm Schanz pins were inserted in the femur and into the tibia for attachment of the array of trackers used for computer-assisted navigation. Hip center was identified using a circumduction technique. Distal landmarks were mapped using the computer. The distal femur and proximal tibia were mapped using the computer. The distal femoral cutting guide was positioned using computer-assisted navigation so as to achieve a 5 distal valgus cut. The femur was sized and it was felt that a size 5 femoral component was appropriate. A size 5 femoral cutting guide was positioned and the anterior cut was performed and verified using the computer. This was followed by completion of the posterior and chamfer cuts. Femoral cutting guide for the central box was then positioned in the center box cut was performed.  Attention was then directed to the proximal tibia. Medial and lateral menisci were excised. The extramedullary tibial cutting guide was positioned using computer-assisted navigation so as to achieve a 0 varus-valgus alignment and 3 posterior slope. The cut was performed and  verified using the computer. The proximal tibia was  sized and it was felt that a size 5 tibial tray was appropriate. Tibial and femoral trials were inserted followed by insertion of a 8 mm polyethylene insert. This allowed for excellent mediolateral soft tissue balancing both in flexion and in full extension. Finally, the patella was cut and prepared so as to accommodate a 38 mm medialized dome patella. A patella trial was placed and the knee was placed through a range of motion with excellent patellar tracking appreciated. The femoral trial was removed after debridement of posterior osteophytes. The central post-hole for the tibial component was reamed followed by insertion of a keel punch. Tibial trials were then removed. Cut surfaces of bone were irrigated with copious amounts of normal saline using pulsatile lavage and then suctioned dry. Polymethylmethacrylate cement with gentamicin was prepared in the usual fashion using a vacuum mixer. Cement was applied to the cut surface of the proximal tibia as well as along the undersurface of a size 5 rotating platform tibial component. Tibial component was positioned and impacted into place. Excess cement was removed using [employer] fast streamer. Cement was then applied to the cut surfaces of the femur as well as along the posterior flanges of the size 5 femoral component. The femoral component was positioned and impacted into place. Excess cement was removed using [employer] fast streamer. An 8 mm polyethylene trial was inserted and the knee was brought into full extension with steady axial compression applied. Finally, cement was applied to the backside of a 38 mm medialized dome patella and the patellar component was positioned and patellar clamp applied. Excess cement was removed using [employer] fast streamer. After adequate curing of the cement, the tourniquet was deflated after a total tourniquet time of 105 minutes. Hemostasis was achieved using electrocautery. The knee was irrigated  with copious amounts of normal saline using pulsatile lavage followed by 500 ml of Surgiphor and then suctioned dry. 20 mL of 1.3% Exparel and 60 mL of 0.25% Marcaine in 40 mL of normal saline was injected along the posterior capsule, medial and lateral gutters, and along the arthrotomy site. An 8 mm stabilized rotating platform polyethylene insert was inserted and the knee was placed through a range of motion with excellent mediolateral soft tissue balancing appreciated and excellent patellar tracking noted. 2 medium drains were placed in the wound bed and brought out through separate stab incisions. The medial parapatellar portion of the incision was reapproximated using interrupted sutures of #1 Vicryl. Subcutaneous tissue was approximated in layers using first #0 Vicryl followed #2-0 Vicryl. The skin was approximated with skin staples. A sterile dressing was applied.  The patient tolerated the procedure well and was transported to the recovery room in stable condition.    Kenric Ginger P. Holley Bouche., M.D.

## 2021-01-28 NOTE — H&P (Signed)
ORTHOPAEDIC HISTORY & PHYSICAL Erin Contreras, Florinda Marker., MD - 01/08/2021 9:00 AM EDT Formatting of this note is different from the original. Images from the original note were not included. Chief Complaint: Chief Complaint  Patient presents with   Left Knee - Pain  Left knee degenerative arthrosis   Annual Exam  Right total knee arthroplasty 07/11/17   Reason for Visit: The patient is a 60 y.o. female with a history of rheumatoid arthritis who presents today for reevaluation of both knees. She is 3-1/2 years status post right total knee arthroplasty. She denies any significant right knee pain, swelling, locking, or giving way. She has some mild startup stiffness. She is not exercising on a regular basis.  With regard to the left knee, she reports a long history of progressive left knee pain. She localizes most of the pain along the medial aspect of the knee. She reports some swelling, no locking, and some giving way of the knee. The pain is aggravated by any weight bearing. The left knee pain limits the patient's ability to ambulate long distances. The patient has not appreciated any significant improvement despite methoterxate, NSAIDs, tramadol, and activity modification. She is not using any ambulatory aids. The patient states that the knee pain has progressed to the point that it is significantly interfering with her activities of daily living.  Of note, the patient is followed by Dr. Holley Raring in the [facility] for lumbar spondylosis with radicular symptoms, SI joint dysfunction, and piriformis syndrome. She has received epidural steroid injections, SI joint injections, and piriformis injections. She states that the back is a significant source of her pain.  Medications: Current Outpatient Medications  Medication Sig Dispense Refill   abatacept/maltose (ABATACEPT, WITH MALTOSE, IV) Inject 1,000 mg into the vein every 28 (twenty-eight) days   acyclovir (ZOVIRAX) 400 MG tablet Take 1 tablet  by mouth 2 (two) times daily as needed   albuterol 90 mcg/actuation inhaler Inhale 2 inhalations into the lungs every 6 (six) hours as needed   cholecalciferol, vitamin D3, 1,250 mcg (50,000 unit) Tab Take by mouth every 7 (seven) days   cyanocobalamin 2000 MCG tablet Take 2,000 mcg by mouth once daily   DULoxetine (CYMBALTA) 20 MG DR capsule Take 40 mg by mouth once daily   folic acid (FOLVITE) 1 MG tablet Take 1 tablet (1 mg total) by mouth once daily 90 tablet 3   ibuprofen (MOTRIN) 800 MG tablet Take 800 mg by mouth 3 (three) times daily with meals   methotrexate (RHEUMATREX) 2.5 MG tablet TAKE 8 TABLETS (20 MG) BY MOUTH ONCE A WEEK (SAME DAY EACH WEEK) 32 tablet 2   pregabalin (LYRICA) 75 MG capsule Take 75 mg by mouth 2 (two) times daily   traMADoL (ULTRAM) 50 mg tablet Take 50 mg by mouth 2 (two) times daily as needed   pregabalin (LYRICA) 75 MG capsule Take by mouth   No current facility-administered medications for this visit.   Allergies: No Known Allergies  Past Medical History: Past Medical History:  Diagnosis Date   Chicken pox   Diabetes (CMS-HCC)   Erosive esophagitis 06/10/14  L A GRADE A   Gastritis 06/10/14   GERD (gastroesophageal reflux disease)   GERD (gastroesophageal reflux disease)   OSA (obstructive sleep apnea)   RA (rheumatoid arthritis) (CMS-HCC) 04/27/2017   Past Surgical History: Past Surgical History:  Procedure Laterality Date   2009   CHOLECYSTECTOMY   COLONOSCOPY  Dr. Allen Norris   EGD 06/10/14   Gastric sleeve  2016   Right ankle fracture with surgery  Childhood- age 22   Right total knee arthroplasty using computer-assisted navigation 07/11/2017  Dr Marry Guan   Social History: Social History   Socioeconomic History   Marital status: Single   Number of children: 3   Years of education: 12   Highest education level: High school graduate  Occupational History   Occupation: Animator- IT trainer  Tobacco Use   Smoking status:  Never Smoker   Smokeless tobacco: Never Used  Scientific laboratory technician Use: Never used  Substance and Sexual Activity   Alcohol use: No   Drug use: No   Sexual activity: Defer  Partners: Male   Family History: Family History  Problem Relation Age of Onset   Liver cancer Mother   Cirrhosis Father   Colon cancer Neg Hx   Colon polyps Neg Hx   Rectal cancer Neg Hx   Ulcers Neg Hx   Review of Systems: A comprehensive 14 point ROS was performed, reviewed, and the pertinent orthopaedic findings are documented in the HPI.  Exam BP (!) 138/90  Temp 36.2 C (97.2 F)  Ht 170.2 cm (5\' 7" )  Wt (!) 126.3 kg (278 lb 6.4 oz)  BMI 43.60 kg/m   General:  Well-developed, well-nourished female seen in no acute distress.  Antalgic gait. Varus thrust to the left knee.  HEENT:  Atraumatic, normocephalic. Pupils are equal and reactive to light. Extraocular motion is intact. Sclera are clear. Oropharynx is clear with moist mucosa.  Neck:  Supple, nontender, and with good ROM. No thyromegaly, adenopathy, JVD, or carotid bruits.  Lungs:  Clear to auscultation bilaterally.  Cardiovascular:  Regular rate and rhythm. Normal S1, S2. No murmur . No appreciable gallops or rubs. Peripheral pulses are palpable. No lower extremity edema. Homan`s test is negative.  Abdomen:  Soft, nontender, nondistended. Bowel sounds are present.  Extremities: Good strength, stability, and range of motion of the upper extremities. Good range of motion of the hips and ankles.  Left Knee: Soft tissue swelling: minimal Effusion: minimal Erythema: none Crepitance: mild Tenderness: medial Alignment: relative varus Mediolateral laxity: medial pseudolaxity Posterior sag: negative Patellar tracking: Good tracking without evidence of subluxation or tilt Atrophy: No significant atrophy.  Quadriceps tone was fair to good. Range of motion: 0/0/94 degrees  Right Knee:  Soft tissue swelling: none Effusion:  none Erythema: none Crepitance: none Tenderness: No focal tenderness  Alignment: normal Mediolateral laxity: stable Atrophy: No significant atrophy.  Quadriceps tone was fair to good. Range of Motion: 0/0/121 degrees   Neurologic:  Awake, alert, and oriented.  Sensory function is intact to pinprick and light touch.  Motor strength is judged to be 5/5.  Motor coordination is within normal limits.  No apparent clonus. No tremor.   X-rays: I ordered and interpreted standing AP, lateral, and sunrise radiographs of the left knee that were obtained in the office today. There is narrowing of the medial cartilage space with associated varus alignment. Osteophyte formation is noted. Subchondral sclerosis is noted. Degenerative changes to the patellofemoral articulation are noted. No evidence of fracture or dislocation.   I ordered and interpreted standing AP, lateral, and sunrise views of the right knee that were obtained in the office today. Good position of the total knee implants. Good alignment is noted on the AP view. Good cement mantle is appreciated without evidence of loosening. No evidence of polyethylene wear or osteolysis. No evidence of fracture or dislocation.   Impression: Degenerative arthrosis  of the left knee Right total knee arthroplasty   Plan:  The findings were discussed in detail with the patient. She continues to do well with the right knee. The patient was given informational material on total knee replacement. Conservative treatment options were reviewed with the patient. We discussed the risks and benefits of surgical intervention. The usual perioperative course was also discussed in detail. The patient expressed understanding of the risks and benefits of surgical intervention and would like to proceed with plans for left total knee arthroplasty.  I spent a total of 45 minutes in both face-to-face and non-face-to-face activities for this visit on the date of this  encounter.  MEDICAL CLEARANCE: Per anesthesiology. ACTIVITY: As tolerated. WORK STATUS: Anticipate out of work for 6-8 weeks following surgery. THERAPY: Preoperative physical therapy evaluation. MEDICATIONS: Requested Prescriptions   No prescriptions requested or ordered in this encounter   FOLLOW-UP: Return for preop History & Physical pending surgery date.  Isidoro Santillana P. Holley Bouche., M.D.  This note was generated in part with voice recognition software and I apologize for any typographical errors that were not detected and corrected.  Electronically signed by Lamar Benes., MD at 01/11/2021 5:48 PM EDT

## 2021-01-29 ENCOUNTER — Encounter: Payer: Self-pay | Admitting: Orthopedic Surgery

## 2021-01-29 DIAGNOSIS — M1712 Unilateral primary osteoarthritis, left knee: Secondary | ICD-10-CM | POA: Diagnosis not present

## 2021-01-29 DIAGNOSIS — Z96651 Presence of right artificial knee joint: Secondary | ICD-10-CM | POA: Diagnosis not present

## 2021-01-29 DIAGNOSIS — E119 Type 2 diabetes mellitus without complications: Secondary | ICD-10-CM | POA: Diagnosis not present

## 2021-01-29 DIAGNOSIS — Z79899 Other long term (current) drug therapy: Secondary | ICD-10-CM | POA: Diagnosis not present

## 2021-01-29 MED ORDER — HYDROCODONE-ACETAMINOPHEN 10-325 MG PO TABS: 1.0000 | ORAL_TABLET | ORAL | Status: DC | PRN

## 2021-01-29 MED ORDER — SODIUM CHLORIDE 0.9 % IV BOLUS
500.0000 mL | Freq: Once | INTRAVENOUS | Status: AC
  Administered 2021-01-29: 500 mL via INTRAVENOUS

## 2021-01-29 MED ORDER — HYDROCODONE-ACETAMINOPHEN 5-325 MG PO TABS
1.0000 | ORAL_TABLET | ORAL | Status: DC | PRN
  Administered 2021-01-29: 1 via ORAL
  Administered 2021-01-30: 2 via ORAL
  Administered 2021-01-30: 1 via ORAL
  Filled 2021-01-29 (×3): qty 2
  Filled 2021-01-29: qty 1

## 2021-01-29 NOTE — TOC Progression Note (Addendum)
Transition of Care [facility]) - Progression Note    Patient Details  Name: Erin Contreras MRN: [id number] Date of Birth: 1961/07/02  Transition of Care [facility]) CM/SW [place], RN Phone Number: 01/29/2021, 2:04 PM  Clinical Narrative:     Met with the patient in the room and discussed DC plan and needs She will have hep from her daughter when she discharges She has transportation and can afford her medications She has a 3 in 1 at home, she has a rw but needs a bariatric RW I notified Rhonda at [facility], it will be brought into the room prior to DC, she is set up with [facility] prior to surgery from Doctor office  [facility] contacted me to let me know that the patient is not over 300 Lbs and will not qualify for a bariatric RW, the total out of pocket cost will be $214.74, I explained this to the patient and she said that she will keep the one she has  No additional needs      Expected Discharge Plan and Services                                                 Social Determinants of Health (SDOH) Interventions    Readmission Risk Interventions No flowsheet data found.

## 2021-01-29 NOTE — Progress Notes (Signed)
  Subjective: 1 Day Post-Op Procedure(s) (LRB): COMPUTER ASSISTED TOTAL KNEE ARTHROPLASTY (Left) Patient reports pain as moderate.   Patient is well, and has had no acute complaints or problems Plan is to go Home after hospital stay. Negative for chest pain and shortness of breath Fever: no Gastrointestinal: negative for nausea and vomiting.  Patient has had a bowel movement.  Objective: Vital signs in last 24 hours: Temp:  [96.5 F (35.8 C)-97.9 F (36.6 C)] 97.6 F (36.4 C) (06/30 0448) Pulse Rate:  [55-74] 68 (06/30 0652) Resp:  [14-20] 18 (06/30 0652) BP: (90-143)/(59-91) 115/67 (06/30 0652) SpO2:  [95 %-100 %] 95 % (06/30 0448) Weight:  [126.6 kg] 126.6 kg (06/29 0949)  Intake/Output from previous day:  Intake/Output Summary (Last 24 hours) at 01/29/2021 0713 Last data filed at 01/29/2021 0448 Gross per 24 hour  Intake 2260 ml  Output 910 ml  Net 1350 ml    Intake/Output this shift: No intake/output data recorded.  Labs: No results for input(s): HGB in the last 72 hours. No results for input(s): WBC, RBC, HCT, PLT in the last 72 hours. No results for input(s): NA, K, CL, CO2, BUN, CREATININE, GLUCOSE, CALCIUM in the last 72 hours. No results for input(s): LABPT, INR in the last 72 hours.   EXAM General - Patient is Alert, Appropriate, and Oriented Extremity - Neurovascular intact Dorsiflexion/Plantar flexion intact Compartment soft Dressing/Incision -Postoperative dressing remains in place., Polar Care in place and working. , Hemovac in place.  Motor Function - intact, moving foot and toes well on exam. Able to perform SLR with assistance.   Cardiovascular- Regular rate and rhythm, no murmurs/rubs/gallops Respiratory- Lungs clear to auscultation bilaterally Gastrointestinal- soft, nontender, and active bowel sounds   Assessment/Plan: 1 Day Post-Op Procedure(s) (LRB): COMPUTER ASSISTED TOTAL KNEE ARTHROPLASTY (Left) Active Problems:   Total knee replacement  status  Estimated body mass index is 41.2 kg/m as calculated from the following:   Height as of this encounter: 5\' 9"  (1.753 m).   Weight as of this encounter: 126.6 kg. Advance diet Up with therapy       DVT Prophylaxis - Lovenox Weight-Bearing as tolerated to left leg  Cassell Smiles, PA-C [facility] 01/29/2021, 7:13 AM

## 2021-01-29 NOTE — Evaluation (Signed)
Occupational Therapy Evaluation Patient Details Name: Erin Contreras MRN: [id number] DOB: 1960-12-25 Today's Date: 01/29/2021    History of Present Illness 60 y/o female s/p L TKA 01/29/21   Clinical Impression   Chart reviewed, pt greeted in bed, agreeable to OT evaluation. Pt provided education on precautions, accepted and demonstrated throughout. Orthostatics taken in supine, seated, standing (see below) during evaluation, pt endorses dizziness and nausea upon standing, symptoms resolved. Pt performs supine>seated at EOB with MIN A with HOB raised, STS with RW with MIN A, SPT to [facility] with MIN A with RW. Intermittent vcs required for appropriate technique. Pt required MIN A for peri care. Pt lives alone in a one story home, 3 STE with bilateral railings, RW, SLC, and BSC available for use as pt is s/p R TKR in 2018. Pt states daughter will be available to assist with all ADL/IADL as needed. Pt was independent with all ADL/IADL PTA. Pt is left in care of PT, NAD, all needs met. HH OT is recommended to address performance deficits, improve safe completion of ADL s/p TKR.   OT will continue to follow while admitted.     Follow Up Recommendations  Home health OT    Equipment Recommendations  3 in 1 bedside commode    Recommendations for Other Services       Precautions / Restrictions Precautions Precautions: Fall;Knee Restrictions Weight Bearing Restrictions: Yes LLE Weight Bearing: Weight bearing as tolerated Other Position/Activity Restrictions: able to perform SLR in supine      Mobility Bed Mobility Overal bed mobility: Needs Assistance Bed Mobility: Supine to Sit     Supine to sit: Min assist;HOB elevated          Transfers Overall transfer level: Needs assistance Equipment used: Rolling walker (2 wheeled) Transfers: Sit to/from [facility] Sit to Stand: Min assist Stand pivot transfers: Min assist            Balance Overall balance assessment:  Needs assistance Sitting-balance support: Feet supported Sitting balance-Leahy Scale: Good     Standing balance support: Bilateral upper extremity supported Standing balance-Leahy Scale: Fair                             ADL either performed or assessed with clinical judgement   ADL Overall ADL's : Needs assistance/impaired Eating/Feeding: Set up            Toilet Transfer: Minimal assistance;Stand-pivot;BSC   Toileting- Clothing Manipulation and Hygiene: Minimal assistance Toileting - Clothing Manipulation Details (indicate cue type and reason): peri care     Functional mobility during ADLs: Min guard;Rolling walker       Vision Baseline Vision/History: Wears glasses Vision Assessment?: No apparent visual deficits     Perception     Praxis      Pertinent Vitals/Pain Pain Assessment: 0-10 Pain Score: 7  Pain Location: L knee Pain Intervention(s): Repositioned (pre mediciated per patient report, agreeable to eval)     Hand Dominance     Extremity/Trunk Assessment Upper Extremity Assessment Upper Extremity Assessment: Overall WFL for tasks assessed   Lower Extremity Assessment Lower Extremity Assessment: LLE deficits/detail LLE Deficits / Details: LLE wrapped with drain; SLR completed in supine       Communication Communication Communication: No difficulties   Cognition Arousal/Alertness: Awake/alert Behavior During Therapy: WFL for tasks assessed/performed Overall Cognitive Status: Within Functional Limits for tasks assessed  General Comments  BP: supine: 104/61; seated at EOB: 120/98; standing: 102/56    Exercises     Shoulder Instructions      Home Living Family/patient expects to be discharged to:: Private residence Living Arrangements: Alone Available Help at Discharge: Family;Available PRN/intermittently (daugther) Type of Home: House Home Access: Stairs to enter [employer] of Steps: 3 Entrance Stairs-Rails: Right;Left Home Layout: One level     Bathroom Shower/Tub: Teacher, early years/pre: Standard     Home Equipment: Environmental consultant - 2 wheels;Cane - single point;Bedside commode          Prior Functioning/Environment Level of Independence: Independent        Comments: drives, independent in ADL/IADL        OT Problem List: Decreased strength;Decreased activity tolerance;Impaired balance (sitting and/or standing)      OT Treatment/Interventions: Self-care/ADL training;Balance training;Therapeutic exercise;DME and/or AE instruction;Therapeutic activities;Patient/family education    OT Goals(Current goals can be found in the care plan section) Acute Rehab OT Goals Patient Stated Goal: go home OT Goal Formulation: With patient Time For Goal Achievement: 02/05/21 Potential to Achieve Goals: Good ADL Goals Pt Will Perform Lower Body Dressing: with modified independence (with AE PRN) Pt Will Transfer to Toilet: with modified independence;ambulating (with LRAD) Pt Will Perform Toileting - Clothing Manipulation and hygiene: with modified independence;sit to/from stand  OT Frequency: Min 2X/week   Barriers to D/C:            Co-evaluation              AM-PAC OT "6 Clicks" Daily Activity     Outcome Measure Help from another person eating meals?: None Help from another person taking care of personal grooming?: A Little Help from another person toileting, which includes using toliet, bedpan, or urinal?: A Little Help from another person bathing (including washing, rinsing, drying)?: A Little Help from another person to put on and taking off regular upper body clothing?: A Little Help from another person to put on and taking off regular lower body clothing?: A Little 6 Click Score: 19   End of Session Equipment Utilized During Treatment: Rolling walker  Activity Tolerance: Patient tolerated treatment well Patient  left: Other (comment) (with PT student)  OT Visit Diagnosis: Other abnormalities of gait and mobility (R26.89);Unsteadiness on feet (R26.81)                Time: 9:32-9:57 OT Time Calculation (min): 25 min Charges:  OT General Charges $OT Visit: 1 Visit OT Evaluation $OT Eval Moderate Complexity: 1 Mod OT Treatments $Self Care/Home Management : 8-22 mins  Shanon Payor, OTD OTR/L  01/29/21, 10:36 AM

## 2021-01-29 NOTE — Evaluation (Signed)
Physical Therapy Evaluation Patient Details Name: Erin Contreras MRN: [id number] DOB: [birth year] Today's Date: 01/29/2021   History of Present Illness  Pt is a 60 y.o. F s/p L TKA on 01/28/21. PMH includes DM, GERD, OSA, RA, and R TKA in 2018.  Clinical Impression  Pt alert, AO x4, noting pain levels 7/10 at rest. PLOF is independent with ADLs, IADLs; Pt's daughter is able to provide assistance at discharge. Pt seated on BSC working with OT start of session, demonstrated good control ascending and descending sit <> stand with min-guard and RW. Ambulated 10 ft w/ RW, min-guard. Pt noted lightheadedness and nausea with ambulation, repositioned in recliner, BP 113/76 in seated reclined position. PT to continue ambulation training in PM while continuing to monitor vitals. Skilled PT intervention is indicated to address deficits in function, mobility, and to return to PLOF as able.  Discharge recommendations at [place] due to support of family.     Follow Up Recommendations Home health PT    Equipment Recommendations  3in1 (PT)    Recommendations for Other Services       Precautions / Restrictions Precautions Precautions: Fall;Knee Precaution Comments: hypotensive with position change Restrictions Weight Bearing Restrictions: Yes LLE Weight Bearing: Weight bearing as tolerated Other Position/Activity Restrictions: able to perform SLR in supine      Mobility  Bed Mobility Overal bed mobility: Needs Assistance Bed Mobility: Supine to Sit     Supine to sit: Min assist;HOB elevated     General bed mobility comments: Pt on BSC working with OT    Transfers Overall transfer level: Needs assistance Equipment used: Rolling walker (2 wheeled) Transfers: Sit to/from Stand Sit to Stand: Min guard Stand pivot transfers: Min assist          Ambulation/Gait Ambulation/Gait assistance: Counsellor (Feet): 10 Feet Assistive device: Rolling walker (2 wheeled) Gait  Pattern/deviations: Trunk flexed        Stairs            Wheelchair Mobility    Modified Rankin (Stroke Patients Only)       Balance Overall balance assessment: Needs assistance Sitting-balance support: Feet supported Sitting balance-Leahy Scale: Good     Standing balance support: Bilateral upper extremity supported;During functional activity Standing balance-Leahy Scale: Fair Standing balance comment: Able to reach while maintianing unilateral UE on RW                             Pertinent Vitals/Pain Pain Assessment: 0-10 Pain Score: 7  Pain Location: L knee Pain Descriptors / Indicators: Aching Pain Intervention(s): Limited activity within patient's tolerance;Monitored during session;Repositioned;Ice applied    Home Living Family/patient expects to be discharged to:: Private residence Living Arrangements: Alone Available Help at Discharge: Family;Available PRN/intermittently Type of Home: House Home Access: Stairs to enter Entrance Stairs-Rails: Psychiatric nurse of Steps: 3 Home Layout: One level Home Equipment: Walker - 2 wheels;Cane - single point;Bedside commode      Prior Function Level of Independence: Independent         Comments: drives, independent in ADL/IADL     Hand Dominance        Extremity/Trunk Assessment   Upper Extremity Assessment Upper Extremity Assessment: Overall WFL for tasks assessed    Lower Extremity Assessment Lower Extremity Assessment: LLE deficits/detail LLE Deficits / Details: Seated long arc quad, slight lag, PT assist last 10 degrees LLE Sensation: WNL       Communication  Communication: No difficulties  Cognition Arousal/Alertness: Awake/alert Behavior During Therapy: WFL for tasks assessed/performed Overall Cognitive Status: Within Functional Limits for tasks assessed                                        General Comments General comments (skin  integrity, edema, etc.): BP: seated recliner 113/76    Exercises Total Joint Exercises Long Arc Quad: AROM;Left;Seated;5 reps Goniometric ROM: ~ 80-85 degrees   Assessment/Plan    PT Assessment Patient needs continued PT services  PT Problem List Decreased strength;Decreased range of motion;Decreased activity tolerance;Decreased balance;Decreased mobility       PT Treatment Interventions Gait training;Stair training;Functional mobility training;Therapeutic activities;Therapeutic exercise;Neuromuscular re-education;Balance training    PT Goals (Current goals can be found in the Care Plan section)  Acute Rehab PT Goals Patient Stated Goal: Feel better PT Goal Formulation: With patient Time For Goal Achievement: 02/12/21 Potential to Achieve Goals: Good    Frequency BID   Barriers to discharge        Co-evaluation               AM-PAC PT "6 Clicks" Mobility  Outcome Measure Help needed turning from your back to your side while in a flat bed without using bedrails?: A Little Help needed moving from lying on your back to sitting on the side of a flat bed without using bedrails?: A Little Help needed moving to and from a bed to a chair (including a wheelchair)?: A Little Help needed standing up from a chair using your arms (e.g., wheelchair or bedside chair)?: A Little Help needed to walk in hospital room?: A Little Help needed climbing 3-5 steps with a railing? : A Lot 6 Click Score: 17    End of Session Equipment Utilized During Treatment: Gait belt Activity Tolerance: Other (comment) (Limited by lightheadedness) Patient left: in chair;with call bell/phone within reach;with chair alarm set;with SCD's reapplied Nurse Communication: Mobility status PT Visit Diagnosis: Muscle weakness (generalized) (M62.81);Difficulty in walking, not elsewhere classified (R26.2)    Time: 8088-1103 PT Time Calculation (min) (ACUTE ONLY): 28 min   Charges:            [employer], SPT

## 2021-01-29 NOTE — Progress Notes (Addendum)
Physical Therapy Treatment Patient Details Name: Erin Contreras MRN: [id number] DOB: 06/21/1961 Today's Date: 01/29/2021    History of Present Illness Pt is a 60 y.o. F s/p L TKA on 01/28/21. PMH includes DM, GERD, OSA, RA, and R TKA in 2018.    PT Comments    Pt alert, cooperative, notes pain 7/10, but overall "feeling better" since AM session. Pt presents with good eccentric and concentric control with transfers and WB on LLE during sit <> stand tasks w/ min-guard, RW. Progressed ambulation to 200 ft w/ min-guard, RW with step-through pattern. No further sx of lightheadedness with activity, BP 101/90s when standing. PT to assess stairs in AM. Skilled PT intervention is indicated to address deficits in function, mobility, and to return to PLOF as able. Discharge recommendations remain HHPT.    Follow Up Recommendations  Home health PT     Equipment Recommendations  3in1 (PT)    Recommendations for Other Services       Precautions / Restrictions Precautions Precautions: Fall;Knee Precaution Booklet Issued: Yes (comment) Precaution Comments: hypotensive with position change Restrictions Weight Bearing Restrictions: Yes LLE Weight Bearing: Weight bearing as tolerated Other Position/Activity Restrictions: performs SLR in supine    Mobility  Bed Mobility Overal bed mobility: Needs Assistance Bed Mobility: Sit to Supine       Sit to supine: Min guard   General bed mobility comments: Pt on BSC working with OT    Transfers Overall transfer level: Needs assistance Equipment used: Rolling walker (2 wheeled) Transfers: Sit to/from Stand Sit to Stand: Min guard         General transfer comment: good eccentric control, WB through LLE  Ambulation/Gait Ambulation/Gait assistance: Min guard Gait Distance (Feet): 200 Feet Assistive device: Rolling walker (2 wheeled) Gait Pattern/deviations: Step-through pattern     General Gait Details: Verbal cues for RW  technique   Stairs             Wheelchair Mobility    Modified Rankin (Stroke Patients Only)       Balance Overall balance assessment: Needs assistance Sitting-balance support: Feet supported;Single extremity supported Sitting balance-Leahy Scale: Good     Standing balance support: During functional activity;Bilateral upper extremity supported Standing balance-Leahy Scale: Fair Standing balance comment: Able to reach while maintianing unilateral UE on RW                            Cognition Arousal/Alertness: Awake/alert Behavior During Therapy: WFL for tasks assessed/performed Overall Cognitive Status: Within Functional Limits for tasks assessed                                        Exercises Total Joint Exercises Ankle Circles/Pumps: AROM;Both;Seated;10 reps Long Arc Quad: AROM;Left;Seated;10 reps Knee Flexion: AROM;10 reps;Left;Seated Goniometric ROM: AROM 80 degrees knee flexion    General Comments General comments (skin integrity, edema, etc.): BP: standing 101/70s      Pertinent Vitals/Pain Pain Assessment: 0-10 Pain Score: 7  Pain Location: posterior L knee Pain Descriptors / Indicators: Aching;Tightness Pain Intervention(s): Limited activity within patient's tolerance;Monitored during session;Ice applied;Repositioned    Home Living Family/patient expects to be discharged to:: Private residence Living Arrangements: Alone Available Help at Discharge: Family;Available PRN/intermittently Type of Home: House Home Access: Stairs to enter Entrance Stairs-Rails: Right;Left Home Layout: One level Home Equipment: Environmental consultant - 2 wheels;Cane - single  point;Bedside commode      Prior Function Level of Independence: Independent          PT Goals (current goals can now be found in the care plan section) Acute Rehab PT Goals Patient Stated Goal: Go home PT Goal Formulation: With patient Time For Goal Achievement:  02/12/21 Potential to Achieve Goals: Good Progress towards PT goals: Progressing toward goals    Frequency    BID      PT Plan Current plan remains appropriate    Co-evaluation              AM-PAC PT "6 Clicks" Mobility   Outcome Measure  Help needed turning from your back to your side while in a flat bed without using bedrails?: A Little Help needed moving from lying on your back to sitting on the side of a flat bed without using bedrails?: A Little Help needed moving to and from a bed to a chair (including a wheelchair)?: A Little Help needed standing up from a chair using your arms (e.g., wheelchair or bedside chair)?: A Little Help needed to walk in hospital room?: A Little Help needed climbing 3-5 steps with a railing? : A Lot 6 Click Score: 17    End of Session Equipment Utilized During Treatment: Gait belt Activity Tolerance: Patient tolerated treatment well Patient left: in bed;with call bell/phone within reach;with bed alarm set;with SCD's reapplied Nurse Communication: Mobility status PT Visit Diagnosis: Muscle weakness (generalized) (M62.81);Difficulty in walking, not elsewhere classified (R26.2)     Time: 9735-3299 PT Time Calculation (min) (ACUTE ONLY): 44 min  Charges:  $Therapeutic Exercise: 8-22 mins $Therapeutic Activity: 8-22 mins                     [employer], SPT

## 2021-01-29 NOTE — Anesthesia Postprocedure Evaluation (Signed)
Anesthesia Post Note  Patient: Erin Contreras  Procedure(s) Performed: COMPUTER ASSISTED TOTAL KNEE ARTHROPLASTY (Left: Knee)  Patient location during evaluation: Nursing Unit Anesthesia Type: Spinal Level of consciousness: oriented and awake and alert Pain management: pain level controlled Vital Signs Assessment: post-procedure vital signs reviewed and stable Respiratory status: spontaneous breathing and respiratory function stable Cardiovascular status: blood pressure returned to baseline and stable Postop Assessment: no headache, no backache, no apparent nausea or vomiting and patient able to bend at knees Anesthetic complications: no   No notable events documented.   Last Vitals:  Vitals:   01/29/21 0652 01/29/21 0748  BP: 115/67 94/60  Pulse: 68 66  Resp: 18 17  Temp:  36.4 C  SpO2:  94%    Last Pain:  Vitals:   01/29/21 0748  TempSrc: Oral  PainSc:                  Brantley Fling

## 2021-01-30 DIAGNOSIS — E119 Type 2 diabetes mellitus without complications: Secondary | ICD-10-CM | POA: Diagnosis not present

## 2021-01-30 DIAGNOSIS — M1712 Unilateral primary osteoarthritis, left knee: Secondary | ICD-10-CM | POA: Diagnosis not present

## 2021-01-30 DIAGNOSIS — Z79899 Other long term (current) drug therapy: Secondary | ICD-10-CM | POA: Diagnosis not present

## 2021-01-30 DIAGNOSIS — Z96651 Presence of right artificial knee joint: Secondary | ICD-10-CM | POA: Diagnosis not present

## 2021-01-30 MED ORDER — HYDROCODONE-ACETAMINOPHEN 5-325 MG PO TABS: 1.0000 | ORAL_TABLET | ORAL | 0 refills | Status: DC | PRN

## 2021-01-30 MED ORDER — ENOXAPARIN SODIUM 40 MG/0.4ML IJ SOSY: 40.0000 mg | PREFILLED_SYRINGE | INTRAMUSCULAR | 0 refills | Status: DC

## 2021-01-30 NOTE — Progress Notes (Signed)
  Subjective: 2 Days Post-Op Procedure(s) (LRB): COMPUTER ASSISTED TOTAL KNEE ARTHROPLASTY (Left) Patient reports pain as well-controlled.   Patient is well, and has had no acute complaints or problems Plan is to go Home after hospital stay. Negative for chest pain and shortness of breath Fever: no Gastrointestinal: negative for nausea and vomiting.  Patient has had a bowel movement.  Objective: Vital signs in last 24 hours: Temp:  [97.5 F (36.4 C)-98.4 F (36.9 C)] 97.7 F (36.5 C) (07/01 1212) Pulse Rate:  [65-80] 76 (07/01 1212) Resp:  [16-20] 18 (07/01 1212) BP: (93-109)/(50-68) 109/66 (07/01 1212) SpO2:  [94 %-100 %] 95 % (07/01 1212)  Intake/Output from previous day:  Intake/Output Summary (Last 24 hours) at 01/30/2021 1345 Last data filed at 01/30/2021 1343 Gross per 24 hour  Intake 2072.68 ml  Output 70 ml  Net 2002.68 ml    Intake/Output this shift: Total I/O In: 240 [P.O.:240] Out: 40 [Drains:40]  Labs: No results for input(s): HGB in the last 72 hours. No results for input(s): WBC, RBC, HCT, PLT in the last 72 hours. No results for input(s): NA, K, CL, CO2, BUN, CREATININE, GLUCOSE, CALCIUM in the last 72 hours. No results for input(s): LABPT, INR in the last 72 hours.   EXAM General - Patient is Alert, Appropriate, and Oriented Extremity - Neurovascular intact Dorsiflexion/Plantar flexion intact Compartment soft Dressing/Incision -Postoperative dressing remains in place., Polar Care in place and working. , Hemovac in place. , Following removal of post-op dressing, no significant drainage noted over incision. Motor Function - intact, moving foot and toes well on exam.  Cardiovascular- Regular rate and rhythm, no murmurs/rubs/gallops Respiratory- Lungs clear to auscultation bilaterally Gastrointestinal- soft, nontender, and active bowel sounds   Assessment/Plan: 2 Days Post-Op Procedure(s) (LRB): COMPUTER ASSISTED TOTAL KNEE ARTHROPLASTY (Left) Active  Problems:   Total knee replacement status  Estimated body mass index is 41.2 kg/m as calculated from the following:   Height as of this encounter: 5\' 9"  (1.753 m).   Weight as of this encounter: 126.6 kg. Advance diet Up with therapy Discharge home with home health    Post-op dressing removed. , Hemovac removed., and Mini compression dressing applied.   DVT Prophylaxis - Lovenox, Ted hose, and SCDs Weight-Bearing as tolerated to left leg  Cassell Smiles, PA-C [facility] 01/30/2021, 1:45 PM

## 2021-01-30 NOTE — Progress Notes (Signed)
Physical Therapy Treatment Patient Details Name: Erin Contreras MRN: [id number] DOB: 25-Aug-1960 Today's Date: 01/30/2021    History of Present Illness Pt is a 60 y.o. F s/p L TKA on 01/28/21. PMH includes DM, GERD, OSA, RA, and R TKA in 2018.    PT Comments    Pt alert, cooperative with therapy. Progressed to stair training with bilateral hand rails, min-guard. Pt demonstrated good control and stability ascending 4 steps, and descending 4 steps. Pt ambulated 150 feet with step through gait pattern, RW, min-guard assist. Vitals stable with treatment, standing BP 112/68. Skilled PT intervention is indicated to address deficits in function, mobility, and to return to PLOF as able.  Discharge recommendations remain HHPT.   Follow Up Recommendations  Home health PT     Equipment Recommendations  3in1 (PT)    Recommendations for Other Services       Precautions / Restrictions Precautions Precautions: Fall;Knee Precaution Booklet Issued: Yes (comment) Precaution Comments: hypotensive with position change Restrictions Weight Bearing Restrictions: Yes LLE Weight Bearing: Weight bearing as tolerated    Mobility  Bed Mobility Overal bed mobility: Needs Assistance Bed Mobility: Supine to Sit     Supine to sit: Min guard Sit to supine: Min guard        Transfers Overall transfer level: Needs assistance Equipment used: Rolling walker (2 wheeled) Transfers: Sit to/from Stand   Stand pivot transfers: Min assist       General transfer comment: good eccentric control, WB through LLE  Ambulation/Gait Ambulation/Gait assistance: Min guard Gait Distance (Feet): 150 Feet   Gait Pattern/deviations: Step-through pattern         Stairs             Wheelchair Mobility    Modified Rankin (Stroke Patients Only)       Balance Overall balance assessment: Needs assistance Sitting-balance support: Feet supported Sitting balance-Leahy Scale: Good     Standing  balance support: During functional activity;Bilateral upper extremity supported Standing balance-Leahy Scale: Fair Standing balance comment: Able to reach while maintianing unilateral UE on RW                            Cognition Arousal/Alertness: Awake/alert Behavior During Therapy: WFL for tasks assessed/performed Overall Cognitive Status: Within Functional Limits for tasks assessed                                        Exercises      General Comments General comments (skin integrity, edema, etc.): BP standing 112/68      Pertinent Vitals/Pain Pain Score: 4  Pain Location: L knee Pain Descriptors / Indicators: Aching;Tightness Pain Intervention(s): Limited activity within patient's tolerance;Monitored during session;Repositioned;Ice applied    Home Living                      Prior Function            PT Goals (current goals can now be found in the care plan section) Acute Rehab PT Goals Patient Stated Goal: Go home PT Goal Formulation: With patient Time For Goal Achievement: 02/12/21 Potential to Achieve Goals: Good Progress towards PT goals: Progressing toward goals    Frequency    BID      PT Plan Current plan remains appropriate    Co-evaluation  AM-PAC PT "6 Clicks" Mobility   Outcome Measure  Help needed turning from your back to your side while in a flat bed without using bedrails?: A Little Help needed moving from lying on your back to sitting on the side of a flat bed without using bedrails?: A Little Help needed moving to and from a bed to a chair (including a wheelchair)?: A Little Help needed standing up from a chair using your arms (e.g., wheelchair or bedside chair)?: A Little Help needed to walk in hospital room?: A Little Help needed climbing 3-5 steps with a railing? : A Little 6 Click Score: 18    End of Session Equipment Utilized During Treatment: Gait belt Activity Tolerance:  Patient tolerated treatment well Patient left: in bed;with call bell/phone within reach;with bed alarm set;with SCD's reapplied Nurse Communication: Mobility status PT Visit Diagnosis: Muscle weakness (generalized) (M62.81);Difficulty in walking, not elsewhere classified (R26.2)     Time: 2620-3559 PT Time Calculation (min) (ACUTE ONLY): 44 min  Charges:                        [employer], SPT

## 2021-01-30 NOTE — Progress Notes (Signed)
Blood pressure 109/66, pulse 76, temperature 97.7 F (36.5 C), resp. rate 18, height 5\' 9"  (1.753 m), weight 126.6 kg, SpO2 95 %.  IV cath removed site was C/D/I. D/C packet discussed with pt and daughter at bedside, both verbalized understanding of the teaching provided. Pt went home with all dressings and belongings, and transported via W/C down to private car.

## 2021-01-30 NOTE — Progress Notes (Signed)
Occupational Therapy Treatment Patient Details Name: Erin Contreras MRN: [id number] DOB: July 11, 1961 Today's Date: 01/30/2021    History of present illness Pt is a 60 y.o. F s/p L TKA on 01/28/21. PMH includes DM, GERD, OSA, RA, and R TKA in 2018.   OT comments  Pt seen for OT tx this date to f/u re: safety with ADLs/ADL mobility. Pt demos improved tolerance for sup to sit and requires only CGA to transition to EOB sitting. In addition, pt with less c/o dizziness with positional changes this date. Pt able to particiapte in LB dressing with only MIN A to thread underwear and socks, she is otherwise able to thread clothing in sitting with SETUP and requires only CGA for standing balance while performing clothing mgt over hips. OT educates pt's dtr re: polar care and compression stocking mgt and she has good understanding. Pt requires CGA for all transfers performed to complete LB self care tasks and demos improved eccentric control. Pt left sitting EOB with RN and pt's dre present and assisting.    Follow Up Recommendations  Home health OT    Equipment Recommendations  3 in 1 bedside commode    Recommendations for Other Services      Precautions / Restrictions Precautions Precautions: Fall;Knee Precaution Comments: hypotensive with position change Restrictions Weight Bearing Restrictions: Yes LLE Weight Bearing: Weight bearing as tolerated Other Position/Activity Restrictions: performs SLR in supine       Mobility Bed Mobility Overal bed mobility: Needs Assistance Bed Mobility: Supine to Sit     Supine to sit: Min guard     General bed mobility comments: improved tolerance    Transfers Overall transfer level: Needs assistance Equipment used: Rolling walker (2 wheeled) Transfers: Sit to/from Stand Sit to Stand: Min guard         General transfer comment: improved control for STS, one cue for hand placement with sequence for RW    Balance Overall balance assessment:  Needs assistance Sitting-balance support: Feet supported Sitting balance-Leahy Scale: Good     Standing balance support: During functional activity;Bilateral upper extremity supported Standing balance-Leahy Scale: Fair                             ADL either performed or assessed with clinical judgement   ADL Overall ADL's : Needs assistance/impaired                 Upper Body Dressing : Minimal assistance;Standing Upper Body Dressing Details (indicate cue type and reason): CGA for standing balnace, only requires MIN A for bra, she is able to don pullover t-shirt in standing with CGA  for balance, with no assist for actual ADL tasks.- Lower Body Dressing: Minimal assistance;Sit to/from stand Lower Body Dressing Details (indicate cue type and reason): improved tolerance for LE ROM this date only requires MIN A to thread socks/underwear, otherwise threads her shorts and performs clothing mgt over hips in standing with CGA wiht RW for balance                     Vision       Perception     Praxis      Cognition Arousal/Alertness: Awake/alert Behavior During Therapy: WFL for tasks assessed/performed Overall Cognitive Status: Within Functional Limits for tasks assessed  Exercises Other Exercises Other Exercises: OT ed with pt's dtr re: importance of allowing pt to attempt to do as much for herself as possible, even in terms of bathing/dressing tasks to improve her tolerance for LE ROM as it pertains to ADLs.   Shoulder Instructions       General Comments      Pertinent Vitals/ Pain       Pain Assessment: Faces Faces Pain Scale: Hurts little more Pain Location: L knee Pain Descriptors / Indicators: Aching;Tightness Pain Intervention(s): Monitored during session;Limited activity within patient's tolerance  Home Living                                          Prior  Functioning/Environment              Frequency  Min 2X/week        Progress Toward Goals  OT Goals(current goals can now be found in the care plan section)  Progress towards OT goals: Progressing toward goals  Acute Rehab OT Goals Patient Stated Goal: Go home OT Goal Formulation: With patient Time For Goal Achievement: 02/05/21 Potential to Achieve Goals: Good  Plan Discharge plan remains appropriate    Co-evaluation                 AM-PAC OT "6 Clicks" Daily Activity     Outcome Measure   Help from another person eating meals?: None Help from another person taking care of personal grooming?: None Help from another person toileting, which includes using toliet, bedpan, or urinal?: A Little Help from another person bathing (including washing, rinsing, drying)?: A Little Help from another person to put on and taking off regular upper body clothing?: None Help from another person to put on and taking off regular lower body clothing?: A Little 6 Click Score: 21    End of Session Equipment Utilized During Treatment: Rolling walker  OT Visit Diagnosis: Other abnormalities of gait and mobility (R26.89);Unsteadiness on feet (R26.81)   Activity Tolerance Patient tolerated treatment well   Patient Left Other (comment) (seated EOB with her dtr and RN prepping her for d/c)   Nurse Communication Mobility status        Time: 3818-2993 OT Time Calculation (min): 27 min  Charges: OT General Charges $OT Visit: 1 Visit OT Treatments $Self Care/Home Management : 23-37 mins  Gerrianne Scale, [place], OTR/L ascom [phone] 01/30/21, 5:49 PM

## 2021-01-30 NOTE — Discharge Summary (Signed)
Physician Discharge Summary  Patient ID: CHANIE SOUCEK MRN: [id number] DOB/AGE: 1961/04/01 60 y.o.  Admit date: 01/28/2021 Discharge date: 01/30/2021  Admission Diagnoses:  Total knee replacement status [Z96.659]  Surgeries:Procedure(s):  Left total knee arthroplasty using computer-assisted navigation   SURGEON:  Marciano Sequin. M.D.   ASSISTANT: Cassell Smiles, PA-C (present and scrubbed throughout the case, critical for assistance with exposure, retraction, instrumentation, and closure)   ANESTHESIA: spinal   ESTIMATED BLOOD LOSS: 50 mL   FLUIDS REPLACED: 1600 mL of crystalloid   TOURNIQUET TIME: 105 minutes   DRAINS: 2 medium Hemovac drains   SOFT TISSUE RELEASES: Anterior cruciate ligament, posterior cruciate ligament, deep medial collateral ligament, patellofemoral ligament   IMPLANTS UTILIZED: DePuy Attune size 5 posterior stabilized femoral component (cemented), size 5 rotating platform tibial component (cemented), 38 mm medialized dome patella (cemented), and a 8 mm stabilized rotating platform polyethylene insert.    Discharge Diagnoses: Patient Active Problem List   Diagnosis Date Noted   Total knee replacement status 01/28/2021   Urinary frequency 01/12/2021   Neurogenic pain 12/30/2020   Depression, major, single episode, moderate ([place]) 10/17/2020   Chronic radicular lumbar pain 05/06/2020   Lumbar spondylosis 05/06/2020   Lumbar degenerative disc disease 05/06/2020   Sacroiliac joint pain 05/06/2020   SI joint arthritis 05/06/2020   Chronic pain syndrome 05/06/2020   Prediabetes 03/21/2019   Eczema 12/30/2017   Vitamin D deficiency 12/30/2017   Insomnia 12/30/2017   Benign hypertension 07/11/2017   Obstructive sleep apnea of adult 04/27/2017   Multiple nodules of lung 04/27/2017   Herpes simplex virus infection 03/17/2017   Piriformis syndrome of right side 12/23/2015   OA (osteoarthritis) of knee    RA (rheumatoid arthritis) ([place])    Obesity     Urge and stress incontinence    Gastroesophageal reflux disease with esophagitis 05/24/2014    Past Medical History:  Diagnosis Date   Boils    Ear mass    GERD (gastroesophageal reflux disease)    Hemorrhoids    Herpes simplex virus infection    HSV infection    Lumbago with sciatica    OA (osteoarthritis) of knee    Obesity    Overactive bladder    PONV (postoperative nausea and vomiting)    Pre-diabetes    RA (rheumatoid arthritis) ([place])    Sleep apnea    does not use cpap-had bariatric surgery and has no issues since surgery   Urge and stress incontinence      Transfusion:    Consultants (if any):   Discharged Condition: Improved  Hospital Course: ELIZA GREEN is an 60 y.o. female who was admitted 01/28/2021 with a diagnosis of left knee osteoarthritis and went to the operating room on 01/28/2021 and underwent left total knee arthoplasty. The patient received perioperative antibiotics for prophylaxis (see below). The patient tolerated the procedure well and was transported to PACU in stable condition. After meeting PACU criteria, the patient was subsequently transferred to the Orthopaedics/Rehabilitation unit.   The patient received DVT prophylaxis in the form of early mobilization, Lovenox, Foot Pumps, and TED hose. A sacral pad had been placed and heels were elevated off of the bed with rolled towels in order to protect skin integrity. Foley catheter was discontinued on postoperative day #0. Wound drains were discontinued on postoperative day #2. The surgical incision was healing well without signs of infection.  Physical therapy was initiated postoperatively for transfers, gait training, and strengthening. Occupational therapy was initiated for  activities of daily living and evaluation for assisted devices. Rehabilitation goals were reviewed in detail with the patient. The patient made steady progress with physical therapy and physical therapy recommended discharge to Home.    The patient achieved the preliminary goals of this hospitalization and was felt to be medically and orthopaedically appropriate for discharge.  She was given perioperative antibiotics:  Anti-infectives (From admission, onward)    Start     Dose/Rate Route Frequency Ordered Stop   01/28/21 1900  ceFAZolin (ANCEF) IVPB 2g/100 mL premix        2 g 200 mL/hr over 30 Minutes Intravenous Every 6 hours 01/28/21 1746 01/29/21 0216   01/28/21 1746  acyclovir (ZOVIRAX) 200 MG capsule 400 mg        400 mg Oral 2 times daily PRN 01/28/21 1746     01/28/21 0934  ceFAZolin (ANCEF) 2-4 GM/100ML-% IVPB       Note to Pharmacy: [place] [facility], [facility]: cabinet override      01/28/21 0934 01/28/21 2104   01/28/21 0600  ceFAZolin (ANCEF) IVPB 2g/100 mL premix        2 g 200 mL/hr over 30 Minutes Intravenous On call to O.R. 01/28/21 0256 01/28/21 1244     .  Recent vital signs:  Vitals:   01/30/21 0721 01/30/21 1212  BP: (!) 93/50 109/66  Pulse: 70 76  Resp: 16 18  Temp: 98.1 F (36.7 C) 97.7 F (36.5 C)  SpO2: 94% 95%    Recent laboratory studies:  No results for input(s): WBC, HGB, HCT, PLT, K, CL, CO2, BUN, CREATININE, GLUCOSE, CALCIUM, LABPT, INR in the last 72 hours.  Diagnostic Studies: DG Knee Left Port  Result Date: 01/28/2021 CLINICAL DATA:  Status post total knee replacement EXAM: PORTABLE LEFT KNEE - 1-2 VIEW COMPARISON:  September 30, 2019 FINDINGS: Frontal and lateral views obtained. Patient is status post total knee replacement with prosthetic components well-seated. No fracture or dislocation. Erosive change. Drain in suprapatellar bursa region. Skin staples noted anteriorly. IMPRESSION: Status post total knee replacement with prosthetic components well-seated. No fracture or dislocation. Acute postoperative changes noted. Electronically Signed   By: Lowella Grip III M.D.   On: 01/28/2021 17:47    Discharge Medications:   Allergies as of 01/30/2021   No Known Allergies       Medication List     STOP taking these medications    acetaminophen 650 MG CR tablet Commonly known as: TYLENOL       TAKE these medications    acyclovir 400 MG tablet Commonly known as: ZOVIRAX TAKE 1 TABLET BY MOUTH TWICE A DAY AS NEEDED   albuterol 108 (90 Base) MCG/ACT inhaler Commonly known as: VENTOLIN HFA Inhale 2 puffs into the lungs every 6 (six) hours as needed for wheezing or shortness of breath.   BIOFREEZE ROLL-ON EX Apply 1 application topically as needed.   celecoxib 200 MG capsule Commonly known as: CELEBREX Take 200 mg by mouth 2 (two) times daily.   diclofenac Sodium 1 % Gel Commonly known as: VOLTAREN Apply topically 4 (four) times daily.   enoxaparin 40 MG/0.4ML injection Commonly known as: LOVENOX Inject 0.4 mLs (40 mg total) into the skin daily for 14 days.   etanercept 50 MG/ML injection Commonly known as: ENBREL Inject 50 mg into the skin once a week.   folic acid 1 MG tablet Commonly known as: FOLVITE Take 1 mg by mouth daily.   HYDROcodone-acetaminophen 5-325 MG tablet Commonly known as: NORCO/VICODIN  Take 1-2 tablets by mouth every 4 (four) hours as needed for moderate pain.   methotrexate 2.5 MG tablet Commonly known as: RHEUMATREX Take 2.5 mg by mouth once a week. On Sundays   Orencia 250 MG injection Generic drug: abatacept Inject into the vein every 30 (thirty) days.   pregabalin 75 MG capsule Commonly known as: Lyrica Take 1 capsule (75 mg total) by mouth 2 (two) times daily.   Vitamin B-12 2000 MCG Tbcr Take by mouth.   Vitamin D3 1.25 MG (50000 UT) Caps Take 1 capsule by mouth once a week.       ASK your doctor about these medications    DULoxetine 20 MG capsule Commonly known as: Cymbalta Take 2 capsules (40 mg total) by mouth daily.               Durable Medical Equipment  (From admission, onward)           Start     Ordered   01/29/21 1403  DME Walker rolling  Once       Comments:  Bariatric walker needed despite the weight, due to Body Habitus the patient will benefit from a bariatric rolling walker  Question Answer Comment  Patient needs a walker to treat with the following condition Total knee replacement status   Patient needs a walker to treat with the following condition Weakness      06 /30/22 1403   01/28/21 1747  DME Bedside commode  Once       Question:  Patient needs a bedside commode to treat with the following condition  Answer:  Total knee replacement status   01/28/21 1746            Disposition: Home with home health PT     Follow-up Information     Fausto Skillern, PA-C Follow up on 02/12/2021.   Specialty: Orthopedic Surgery Why: at 1:15pm Contact information: [place] [address] [phone]         Dereck Leep, MD Follow up on 03/12/2021.   Specialty: Orthopedic Surgery Why: at 2:30pm Contact information: [place] [address] [place], PA-C 01/30/2021, 1:49 PM

## 2021-01-31 DIAGNOSIS — G894 Chronic pain syndrome: Secondary | ICD-10-CM | POA: Diagnosis not present

## 2021-01-31 DIAGNOSIS — I10 Essential (primary) hypertension: Secondary | ICD-10-CM | POA: Diagnosis not present

## 2021-01-31 DIAGNOSIS — G4700 Insomnia, unspecified: Secondary | ICD-10-CM | POA: Diagnosis not present

## 2021-01-31 DIAGNOSIS — B009 Herpesviral infection, unspecified: Secondary | ICD-10-CM | POA: Diagnosis not present

## 2021-01-31 DIAGNOSIS — M5116 Intervertebral disc disorders with radiculopathy, lumbar region: Secondary | ICD-10-CM | POA: Diagnosis not present

## 2021-01-31 DIAGNOSIS — E669 Obesity, unspecified: Secondary | ICD-10-CM | POA: Diagnosis not present

## 2021-01-31 DIAGNOSIS — E559 Vitamin D deficiency, unspecified: Secondary | ICD-10-CM | POA: Diagnosis not present

## 2021-01-31 DIAGNOSIS — G4733 Obstructive sleep apnea (adult) (pediatric): Secondary | ICD-10-CM | POA: Diagnosis not present

## 2021-01-31 DIAGNOSIS — K5900 Constipation, unspecified: Secondary | ICD-10-CM | POA: Diagnosis not present

## 2021-01-31 DIAGNOSIS — Z471 Aftercare following joint replacement surgery: Secondary | ICD-10-CM | POA: Diagnosis not present

## 2021-01-31 DIAGNOSIS — M461 Sacroiliitis, not elsewhere classified: Secondary | ICD-10-CM | POA: Diagnosis not present

## 2021-01-31 DIAGNOSIS — E1140 Type 2 diabetes mellitus with diabetic neuropathy, unspecified: Secondary | ICD-10-CM | POA: Diagnosis not present

## 2021-01-31 DIAGNOSIS — K649 Unspecified hemorrhoids: Secondary | ICD-10-CM | POA: Diagnosis not present

## 2021-01-31 DIAGNOSIS — M069 Rheumatoid arthritis, unspecified: Secondary | ICD-10-CM | POA: Diagnosis not present

## 2021-01-31 DIAGNOSIS — M47816 Spondylosis without myelopathy or radiculopathy, lumbar region: Secondary | ICD-10-CM | POA: Diagnosis not present

## 2021-01-31 DIAGNOSIS — K219 Gastro-esophageal reflux disease without esophagitis: Secondary | ICD-10-CM | POA: Diagnosis not present

## 2021-02-03 DIAGNOSIS — G894 Chronic pain syndrome: Secondary | ICD-10-CM | POA: Diagnosis not present

## 2021-02-03 DIAGNOSIS — B009 Herpesviral infection, unspecified: Secondary | ICD-10-CM | POA: Diagnosis not present

## 2021-02-03 DIAGNOSIS — Z471 Aftercare following joint replacement surgery: Secondary | ICD-10-CM | POA: Diagnosis not present

## 2021-02-03 DIAGNOSIS — M069 Rheumatoid arthritis, unspecified: Secondary | ICD-10-CM | POA: Diagnosis not present

## 2021-02-03 DIAGNOSIS — M47816 Spondylosis without myelopathy or radiculopathy, lumbar region: Secondary | ICD-10-CM | POA: Diagnosis not present

## 2021-02-03 DIAGNOSIS — E559 Vitamin D deficiency, unspecified: Secondary | ICD-10-CM | POA: Diagnosis not present

## 2021-02-03 DIAGNOSIS — G4700 Insomnia, unspecified: Secondary | ICD-10-CM | POA: Diagnosis not present

## 2021-02-03 DIAGNOSIS — K5900 Constipation, unspecified: Secondary | ICD-10-CM | POA: Diagnosis not present

## 2021-02-03 DIAGNOSIS — M5116 Intervertebral disc disorders with radiculopathy, lumbar region: Secondary | ICD-10-CM | POA: Diagnosis not present

## 2021-02-03 DIAGNOSIS — K219 Gastro-esophageal reflux disease without esophagitis: Secondary | ICD-10-CM | POA: Diagnosis not present

## 2021-02-03 DIAGNOSIS — K649 Unspecified hemorrhoids: Secondary | ICD-10-CM | POA: Diagnosis not present

## 2021-02-03 DIAGNOSIS — M461 Sacroiliitis, not elsewhere classified: Secondary | ICD-10-CM | POA: Diagnosis not present

## 2021-02-03 DIAGNOSIS — E1140 Type 2 diabetes mellitus with diabetic neuropathy, unspecified: Secondary | ICD-10-CM | POA: Diagnosis not present

## 2021-02-03 DIAGNOSIS — G4733 Obstructive sleep apnea (adult) (pediatric): Secondary | ICD-10-CM | POA: Diagnosis not present

## 2021-02-03 DIAGNOSIS — I10 Essential (primary) hypertension: Secondary | ICD-10-CM | POA: Diagnosis not present

## 2021-02-03 DIAGNOSIS — E669 Obesity, unspecified: Secondary | ICD-10-CM | POA: Diagnosis not present

## 2021-02-05 DIAGNOSIS — E559 Vitamin D deficiency, unspecified: Secondary | ICD-10-CM | POA: Diagnosis not present

## 2021-02-05 DIAGNOSIS — K5900 Constipation, unspecified: Secondary | ICD-10-CM | POA: Diagnosis not present

## 2021-02-05 DIAGNOSIS — B009 Herpesviral infection, unspecified: Secondary | ICD-10-CM | POA: Diagnosis not present

## 2021-02-05 DIAGNOSIS — M5116 Intervertebral disc disorders with radiculopathy, lumbar region: Secondary | ICD-10-CM | POA: Diagnosis not present

## 2021-02-05 DIAGNOSIS — G894 Chronic pain syndrome: Secondary | ICD-10-CM | POA: Diagnosis not present

## 2021-02-05 DIAGNOSIS — M47816 Spondylosis without myelopathy or radiculopathy, lumbar region: Secondary | ICD-10-CM | POA: Diagnosis not present

## 2021-02-05 DIAGNOSIS — K649 Unspecified hemorrhoids: Secondary | ICD-10-CM | POA: Diagnosis not present

## 2021-02-05 DIAGNOSIS — E1140 Type 2 diabetes mellitus with diabetic neuropathy, unspecified: Secondary | ICD-10-CM | POA: Diagnosis not present

## 2021-02-05 DIAGNOSIS — K219 Gastro-esophageal reflux disease without esophagitis: Secondary | ICD-10-CM | POA: Diagnosis not present

## 2021-02-05 DIAGNOSIS — M069 Rheumatoid arthritis, unspecified: Secondary | ICD-10-CM | POA: Diagnosis not present

## 2021-02-05 DIAGNOSIS — G4700 Insomnia, unspecified: Secondary | ICD-10-CM | POA: Diagnosis not present

## 2021-02-05 DIAGNOSIS — G4733 Obstructive sleep apnea (adult) (pediatric): Secondary | ICD-10-CM | POA: Diagnosis not present

## 2021-02-05 DIAGNOSIS — Z471 Aftercare following joint replacement surgery: Secondary | ICD-10-CM | POA: Diagnosis not present

## 2021-02-05 DIAGNOSIS — E669 Obesity, unspecified: Secondary | ICD-10-CM | POA: Diagnosis not present

## 2021-02-05 DIAGNOSIS — I10 Essential (primary) hypertension: Secondary | ICD-10-CM | POA: Diagnosis not present

## 2021-02-05 DIAGNOSIS — M461 Sacroiliitis, not elsewhere classified: Secondary | ICD-10-CM | POA: Diagnosis not present

## 2021-02-06 DIAGNOSIS — M5116 Intervertebral disc disorders with radiculopathy, lumbar region: Secondary | ICD-10-CM | POA: Diagnosis not present

## 2021-02-06 DIAGNOSIS — B009 Herpesviral infection, unspecified: Secondary | ICD-10-CM | POA: Diagnosis not present

## 2021-02-06 DIAGNOSIS — E669 Obesity, unspecified: Secondary | ICD-10-CM | POA: Diagnosis not present

## 2021-02-06 DIAGNOSIS — K649 Unspecified hemorrhoids: Secondary | ICD-10-CM | POA: Diagnosis not present

## 2021-02-06 DIAGNOSIS — G894 Chronic pain syndrome: Secondary | ICD-10-CM | POA: Diagnosis not present

## 2021-02-06 DIAGNOSIS — M47816 Spondylosis without myelopathy or radiculopathy, lumbar region: Secondary | ICD-10-CM | POA: Diagnosis not present

## 2021-02-06 DIAGNOSIS — M461 Sacroiliitis, not elsewhere classified: Secondary | ICD-10-CM | POA: Diagnosis not present

## 2021-02-06 DIAGNOSIS — K5900 Constipation, unspecified: Secondary | ICD-10-CM | POA: Diagnosis not present

## 2021-02-06 DIAGNOSIS — G4700 Insomnia, unspecified: Secondary | ICD-10-CM | POA: Diagnosis not present

## 2021-02-06 DIAGNOSIS — M069 Rheumatoid arthritis, unspecified: Secondary | ICD-10-CM | POA: Diagnosis not present

## 2021-02-06 DIAGNOSIS — E559 Vitamin D deficiency, unspecified: Secondary | ICD-10-CM | POA: Diagnosis not present

## 2021-02-06 DIAGNOSIS — K219 Gastro-esophageal reflux disease without esophagitis: Secondary | ICD-10-CM | POA: Diagnosis not present

## 2021-02-06 DIAGNOSIS — I10 Essential (primary) hypertension: Secondary | ICD-10-CM | POA: Diagnosis not present

## 2021-02-06 DIAGNOSIS — Z471 Aftercare following joint replacement surgery: Secondary | ICD-10-CM | POA: Diagnosis not present

## 2021-02-06 DIAGNOSIS — E1140 Type 2 diabetes mellitus with diabetic neuropathy, unspecified: Secondary | ICD-10-CM | POA: Diagnosis not present

## 2021-02-06 DIAGNOSIS — G4733 Obstructive sleep apnea (adult) (pediatric): Secondary | ICD-10-CM | POA: Diagnosis not present

## 2021-02-09 DIAGNOSIS — G4700 Insomnia, unspecified: Secondary | ICD-10-CM | POA: Diagnosis not present

## 2021-02-09 DIAGNOSIS — M069 Rheumatoid arthritis, unspecified: Secondary | ICD-10-CM | POA: Diagnosis not present

## 2021-02-09 DIAGNOSIS — E669 Obesity, unspecified: Secondary | ICD-10-CM | POA: Diagnosis not present

## 2021-02-09 DIAGNOSIS — B009 Herpesviral infection, unspecified: Secondary | ICD-10-CM | POA: Diagnosis not present

## 2021-02-09 DIAGNOSIS — I10 Essential (primary) hypertension: Secondary | ICD-10-CM | POA: Diagnosis not present

## 2021-02-09 DIAGNOSIS — M47816 Spondylosis without myelopathy or radiculopathy, lumbar region: Secondary | ICD-10-CM | POA: Diagnosis not present

## 2021-02-09 DIAGNOSIS — K219 Gastro-esophageal reflux disease without esophagitis: Secondary | ICD-10-CM | POA: Diagnosis not present

## 2021-02-09 DIAGNOSIS — M5116 Intervertebral disc disorders with radiculopathy, lumbar region: Secondary | ICD-10-CM | POA: Diagnosis not present

## 2021-02-09 DIAGNOSIS — E1140 Type 2 diabetes mellitus with diabetic neuropathy, unspecified: Secondary | ICD-10-CM | POA: Diagnosis not present

## 2021-02-09 DIAGNOSIS — K5900 Constipation, unspecified: Secondary | ICD-10-CM | POA: Diagnosis not present

## 2021-02-09 DIAGNOSIS — Z471 Aftercare following joint replacement surgery: Secondary | ICD-10-CM | POA: Diagnosis not present

## 2021-02-09 DIAGNOSIS — K649 Unspecified hemorrhoids: Secondary | ICD-10-CM | POA: Diagnosis not present

## 2021-02-09 DIAGNOSIS — E559 Vitamin D deficiency, unspecified: Secondary | ICD-10-CM | POA: Diagnosis not present

## 2021-02-09 DIAGNOSIS — G894 Chronic pain syndrome: Secondary | ICD-10-CM | POA: Diagnosis not present

## 2021-02-09 DIAGNOSIS — M461 Sacroiliitis, not elsewhere classified: Secondary | ICD-10-CM | POA: Diagnosis not present

## 2021-02-09 DIAGNOSIS — G4733 Obstructive sleep apnea (adult) (pediatric): Secondary | ICD-10-CM | POA: Diagnosis not present

## 2021-02-10 IMAGING — DX DG KNEE COMPLETE 4+V*L*
4 series · 4 of 4 positions shown · non-contrast
Comparison: None.

CLINICAL DATA: LEFT knee pain for 3 weeks. No known injury. Initial
encounter.

EXAM:
LEFT KNEE - COMPLETE 4+ VIEW

[knee ap]
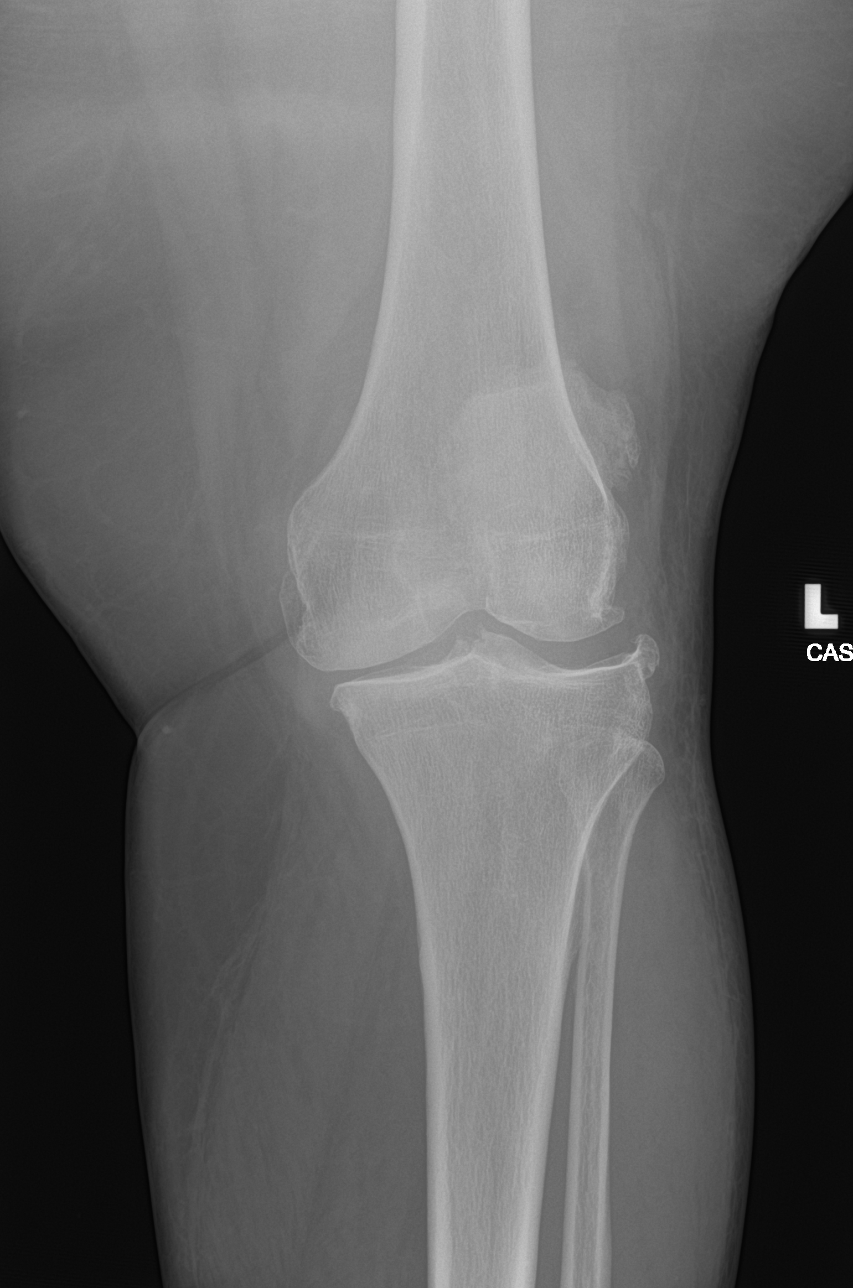

[knee lat]
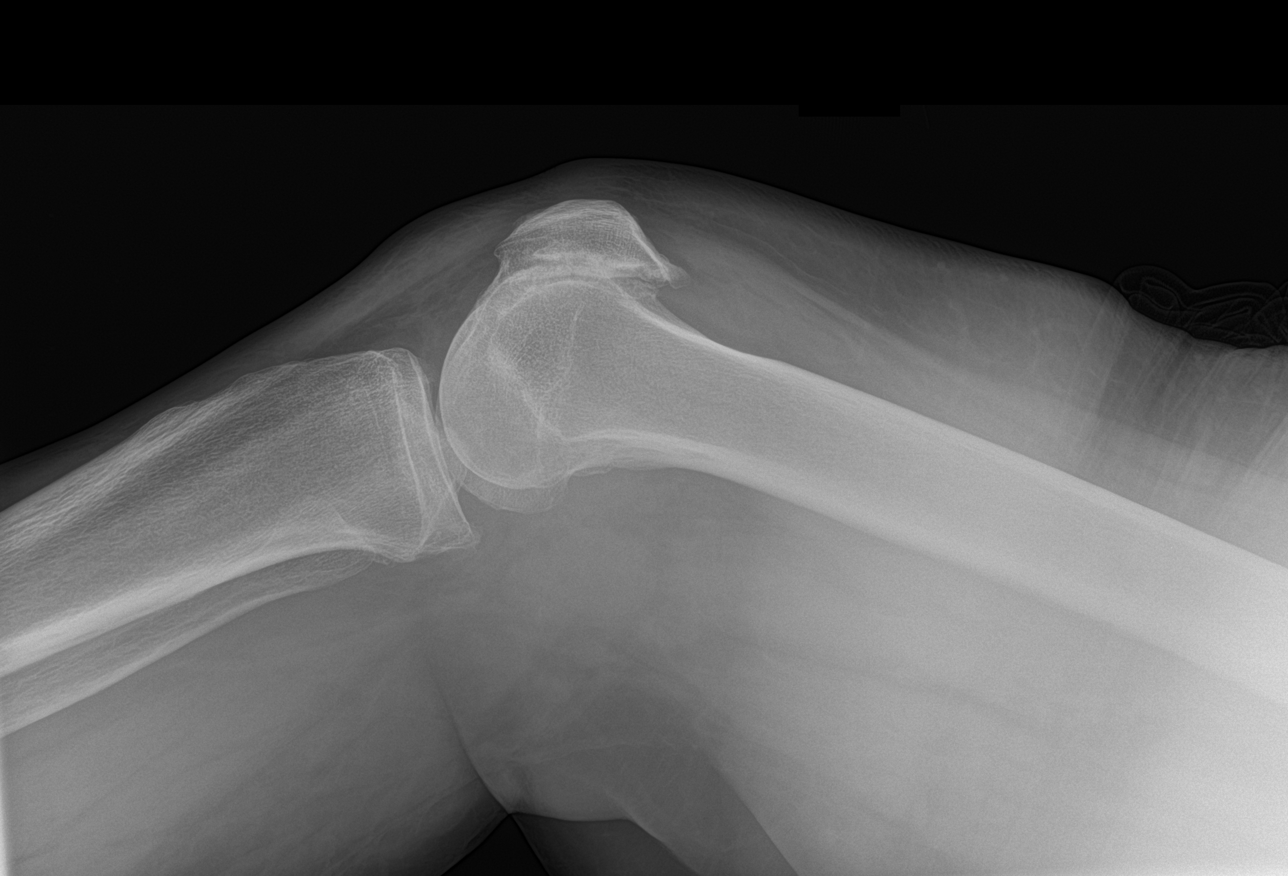

[knee obl (1 of 2)]
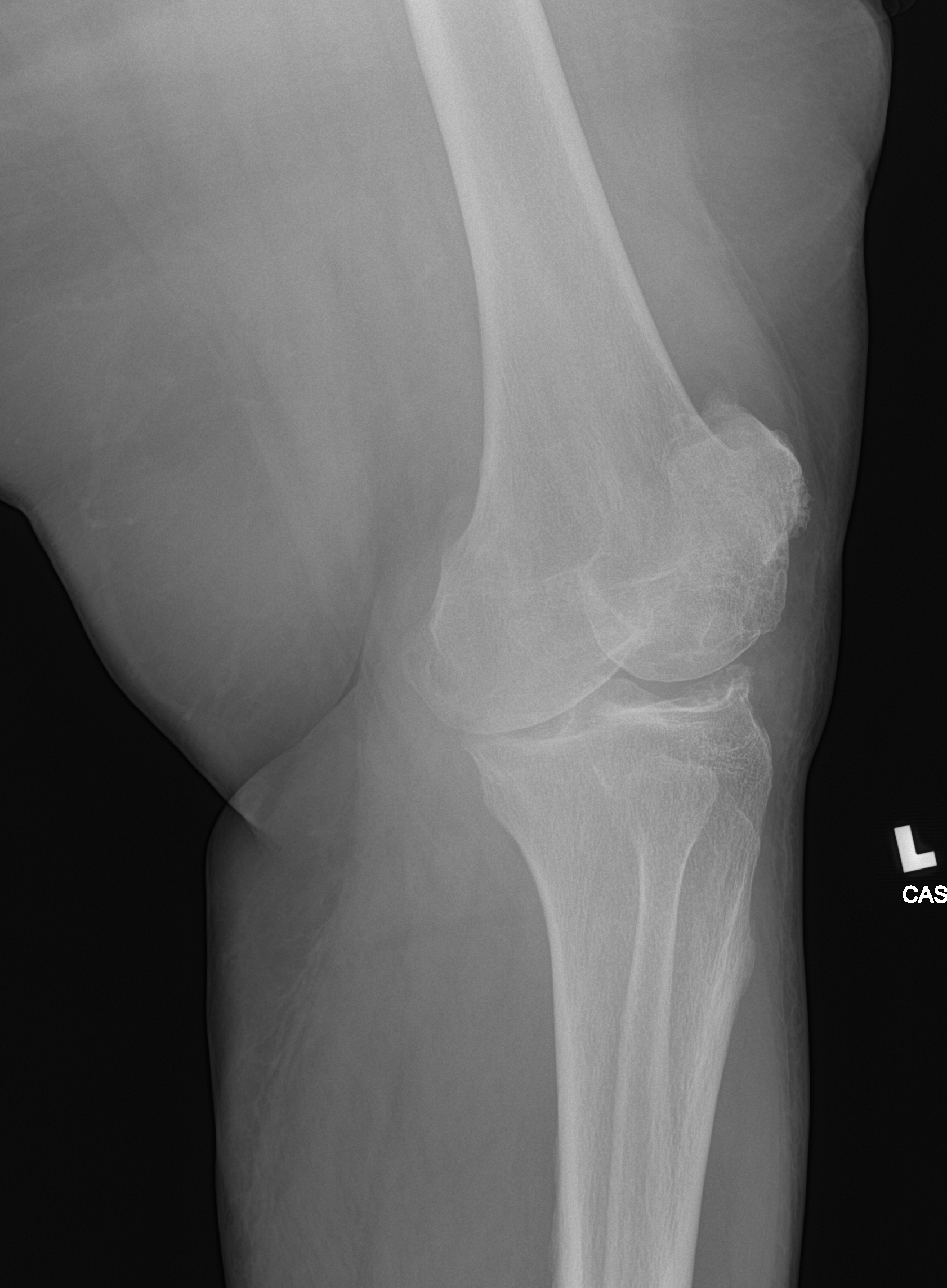

[knee obl (2 of 2)]
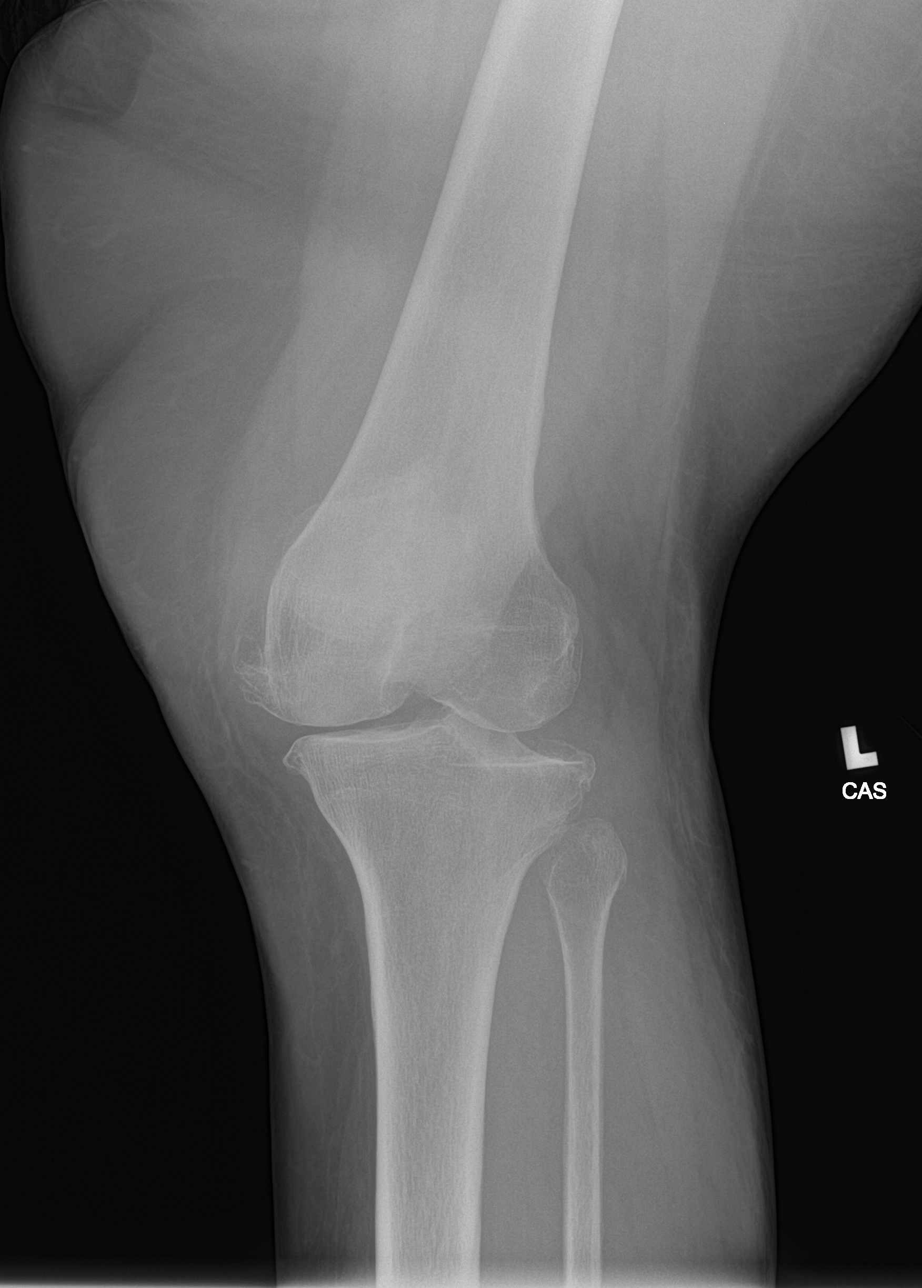

[4 of 4 positions shown; findings below may reference images not displayed]

FINDINGS: No acute fracture or dislocation.

Mild LATERAL subluxation at the tibiotalar joint noted.

Joint space narrowing osteophytosis noted, severe in the
patellofemoral compartment.

A small joint effusion is present.

No suspicious focal bony lesions are identified.
IMPRESSION: 1. Small joint effusion without acute bony abnormality.
2. Tricompartmental degenerative changes, severe in the
patellofemoral compartment.

## 2021-02-11 DIAGNOSIS — K649 Unspecified hemorrhoids: Secondary | ICD-10-CM | POA: Diagnosis not present

## 2021-02-11 DIAGNOSIS — I10 Essential (primary) hypertension: Secondary | ICD-10-CM | POA: Diagnosis not present

## 2021-02-11 DIAGNOSIS — M47816 Spondylosis without myelopathy or radiculopathy, lumbar region: Secondary | ICD-10-CM | POA: Diagnosis not present

## 2021-02-11 DIAGNOSIS — G4733 Obstructive sleep apnea (adult) (pediatric): Secondary | ICD-10-CM | POA: Diagnosis not present

## 2021-02-11 DIAGNOSIS — Z471 Aftercare following joint replacement surgery: Secondary | ICD-10-CM | POA: Diagnosis not present

## 2021-02-11 DIAGNOSIS — E669 Obesity, unspecified: Secondary | ICD-10-CM | POA: Diagnosis not present

## 2021-02-11 DIAGNOSIS — G4700 Insomnia, unspecified: Secondary | ICD-10-CM | POA: Diagnosis not present

## 2021-02-11 DIAGNOSIS — G894 Chronic pain syndrome: Secondary | ICD-10-CM | POA: Diagnosis not present

## 2021-02-11 DIAGNOSIS — M0579 Rheumatoid arthritis with rheumatoid factor of multiple sites without organ or systems involvement: Secondary | ICD-10-CM | POA: Diagnosis not present

## 2021-02-11 DIAGNOSIS — M5116 Intervertebral disc disorders with radiculopathy, lumbar region: Secondary | ICD-10-CM | POA: Diagnosis not present

## 2021-02-11 DIAGNOSIS — K219 Gastro-esophageal reflux disease without esophagitis: Secondary | ICD-10-CM | POA: Diagnosis not present

## 2021-02-11 DIAGNOSIS — K5900 Constipation, unspecified: Secondary | ICD-10-CM | POA: Diagnosis not present

## 2021-02-11 DIAGNOSIS — M461 Sacroiliitis, not elsewhere classified: Secondary | ICD-10-CM | POA: Diagnosis not present

## 2021-02-11 DIAGNOSIS — B009 Herpesviral infection, unspecified: Secondary | ICD-10-CM | POA: Diagnosis not present

## 2021-02-11 DIAGNOSIS — E559 Vitamin D deficiency, unspecified: Secondary | ICD-10-CM | POA: Diagnosis not present

## 2021-02-11 DIAGNOSIS — E1140 Type 2 diabetes mellitus with diabetic neuropathy, unspecified: Secondary | ICD-10-CM | POA: Diagnosis not present

## 2021-02-11 DIAGNOSIS — M069 Rheumatoid arthritis, unspecified: Secondary | ICD-10-CM | POA: Diagnosis not present

## 2021-02-12 DIAGNOSIS — Z96652 Presence of left artificial knee joint: Secondary | ICD-10-CM | POA: Diagnosis not present

## 2021-02-17 DIAGNOSIS — Z96652 Presence of left artificial knee joint: Secondary | ICD-10-CM | POA: Diagnosis not present

## 2021-02-19 ENCOUNTER — Ambulatory Visit
Payer: BC Managed Care – PPO | Attending: Student in an Organized Health Care Education/Training Program | Admitting: Student in an Organized Health Care Education/Training Program

## 2021-02-19 ENCOUNTER — Other Ambulatory Visit: Payer: Self-pay

## 2021-02-19 ENCOUNTER — Encounter: Payer: Self-pay | Admitting: Student in an Organized Health Care Education/Training Program

## 2021-02-19 VITALS — BP 115/78 | HR 79 | Temp 97.2°F | Resp 16 | Ht 69.0 in | Wt 279.0 lb

## 2021-02-19 DIAGNOSIS — M47818 Spondylosis without myelopathy or radiculopathy, sacral and sacrococcygeal region: Secondary | ICD-10-CM | POA: Diagnosis not present

## 2021-02-19 DIAGNOSIS — M792 Neuralgia and neuritis, unspecified: Secondary | ICD-10-CM | POA: Diagnosis not present

## 2021-02-19 DIAGNOSIS — G5701 Lesion of sciatic nerve, right lower limb: Secondary | ICD-10-CM | POA: Insufficient documentation

## 2021-02-19 DIAGNOSIS — G8929 Other chronic pain: Secondary | ICD-10-CM | POA: Diagnosis not present

## 2021-02-19 DIAGNOSIS — M5416 Radiculopathy, lumbar region: Secondary | ICD-10-CM | POA: Diagnosis not present

## 2021-02-19 DIAGNOSIS — G894 Chronic pain syndrome: Secondary | ICD-10-CM | POA: Diagnosis not present

## 2021-02-19 DIAGNOSIS — M533 Sacrococcygeal disorders, not elsewhere classified: Secondary | ICD-10-CM

## 2021-02-19 MED ORDER — DULOXETINE HCL 30 MG PO CPEP
30.0000 mg | ORAL_CAPSULE | Freq: Every day | ORAL | 5 refills | Status: DC
Start: 2021-02-19 — End: 2021-11-17

## 2021-02-19 MED ORDER — PREGABALIN 75 MG PO CAPS
75.0000 mg | ORAL_CAPSULE | Freq: Two times a day (BID) | ORAL | 5 refills | Status: DC
Start: 2021-02-19 — End: 2021-04-16

## 2021-02-19 NOTE — Progress Notes (Signed)
PROVIDER NOTE: Information contained herein reflects review and annotations entered in association with encounter. Interpretation of such information and data should be left to medically-trained personnel. Information provided to patient can be located elsewhere in the medical record under "Patient Instructions". Document created using STT-dictation technology, any transcriptional errors that may result from process are unintentional.    Patient: Erin Contreras  Service Category: E/M  Provider: Gillis Santa, MD  DOB: 10-18-1960  DOS: 02/19/2021  Specialty: Interventional Pain Management  MRN: [id number]  Setting: Ambulatory outpatient  PCP: Venita Lick, NP  Type: Established Patient    Referring Provider: Venita Lick, NP  Location: Office  Delivery: Face-to-face     HPI  [name], a 60 y.o. year old female, is here today because of her Lumbar radiculopathy [M54.16]. [name] primary complain today is Back Pain (Lumbar bilateral right is worse ) and Knee Pain (LEFT S/P JOINT REPLACEMENT 01/28/21) Last encounter: My last encounter with her was on 12/04/2020. Pertinent problems: [name] has OA (osteoarthritis) of knee; Obesity; Lumbar radiculopathy; Lumbar spondylosis; Lumbar degenerative disc disease; Sacroiliac joint pain; SI joint arthritis; and Chronic pain syndrome on their pertinent problem list. Pain Assessment: Severity of Chronic pain is reported as a 5 /10. Location: Back Lower, Left, Right/down the right leg mainly and beginning to go down the left leg at times.. Onset: More than a month ago. Quality: Discomfort, Constant, Sharp. Timing: Constant. Modifying factor(s): rest, medications help a little bit.. Vitals:  height is $RemoveB'5\' 9"'KTIinbov$  (1.753 m) and weight is 279 lb (126.6 kg). Her temporal temperature is 97.2 F (36.2 C) (abnormal). Her blood pressure is 115/78 and her pulse is 79. Her respiration is 16 and oxygen saturation is 99%.   Reason for encounter: medication  management.    Status post left knee replacement on 01/28/2021.  Patient is working with physical therapy and continues to recover from her surgery. Is endorsing low back as well as radiating right hip and right posterior lateral thigh pain which is dermatomal in nature.  Patient's previous lumbar epidural steroid injection was in 2021.  We discussed repeating lumbar ESI for her lumbar radicular pain flare. Refills of Lyrica and Cymbalta as below  ROS  Constitutional: Denies any fever or chills Gastrointestinal: No reported hemesis, hematochezia, vomiting, or acute GI distress Musculoskeletal:  Low back, right hip, right SI joint pain Neurological: No reported episodes of acute onset apraxia, aphasia, dysarthria, agnosia, amnesia, paralysis, loss of coordination, or loss of consciousness  Medication Review  DULoxetine, HYDROcodone-acetaminophen, Menthol (Topical Analgesic), Vitamin B-12, Vitamin D3, abatacept, acyclovir, albuterol, celecoxib, diclofenac Sodium, enoxaparin, etanercept, folic acid, methotrexate, and pregabalin  History Review  Allergy: [name] has No Known Allergies. Drug: [name]  reports no history of drug use. Alcohol:  reports no history of alcohol use. Tobacco:  reports that she has never smoked. She has never used smokeless tobacco. Social: [name]  reports that she has never smoked. She has never used smokeless tobacco. She reports that she does not drink alcohol and does not use drugs. Medical:  has a past medical history of Boils, Ear mass, GERD (gastroesophageal reflux disease), Hemorrhoids, Herpes simplex virus infection, HSV infection, Lumbago with sciatica, OA (osteoarthritis) of knee, Obesity, Overactive bladder, PONV (postoperative nausea and vomiting), Pre-diabetes, RA (rheumatoid arthritis) ([place]), Sleep apnea, and Urge and stress incontinence. Surgical: Erin Contreras  has a past surgical history that includes Cholecystectomy (2011); Tumor removal (Right,  2010); Sleeve Gastroplasty; Fracture surgery (Right);  Knee Arthroplasty (Right, 07/11/2017); Knee Arthroplasty (Left, 01/28/2021); and Joint replacement (Left, 01/28/2021). Family: family history includes Cancer in her mother; Cirrhosis in her father; Diabetes in her mother, paternal grandfather, and paternal grandmother; Heart disease in her paternal grandfather and paternal grandmother; Hyperlipidemia in her mother; Hypertension in her daughter and mother; Stroke in her maternal grandfather and maternal grandmother.  Laboratory Chemistry Profile   Renal Lab Results  Component Value Date   BUN 15 01/21/2021   CREATININE 0.60 01/21/2021   BCR 19 10/17/2020   GFRAA 102 06/12/2020   GFRNONAA >60 01/21/2021     Hepatic Lab Results  Component Value Date   AST 21 01/21/2021   ALT 20 01/21/2021   ALBUMIN 3.4 (L) 01/21/2021   ALKPHOS 76 01/21/2021   LIPASE 106 05/10/2014     Electrolytes Lab Results  Component Value Date   NA 138 01/21/2021   K 3.7 01/21/2021   CL 107 01/21/2021   CALCIUM 8.5 (L) 01/21/2021   MG 2.0 09/04/2014   PHOS 3.7 09/04/2014     Bone Lab Results  Component Value Date   VD25OH 24.3 (L) 10/17/2020     Inflammation (CRP: Acute Phase) (ESR: Chronic Phase) Lab Results  Component Value Date   CRP 0.6 01/21/2021   ESRSEDRATE 43 (H) 01/21/2021       Note: Above Lab results reviewed.   Physical Exam  General appearance: Well nourished, well developed, and well hydrated. In no apparent acute distress Mental status: Alert, oriented x 3 (person, place, & time)       Respiratory: No evidence of acute respiratory distress Eyes: PERLA Vitals: BP 115/78 (BP Location: Right Arm, Patient Position: Sitting, Cuff Size: Large)   Pulse 79   Temp (!) 97.2 F (36.2 C) (Temporal)   Resp 16   Ht $R'5\' 9"'cV$  (1.753 m)   Wt 279 lb (126.6 kg)   SpO2 99%   BMI 41.20 kg/m  BMI: Estimated body mass index is 41.2 kg/m as calculated from the following:   Height as of this  encounter: $RemoveBef'5\' 9"'rNxsxJpinB$  (1.753 m).   Weight as of this encounter: 279 lb (126.6 kg). Ideal: Ideal body weight: 66.2 kg (145 lb 15.1 oz) Adjusted ideal body weight: 90.3 kg (199 lb 2.7 oz)    Lumbar Exam  Skin & Axial Inspection: No masses, redness, or swelling Alignment: Symmetrical Functional ROM: Pain restricted ROM       Stability: No instability detected Muscle Tone/Strength: Functionally intact. No obvious neuro-muscular anomalies detected. Sensory (Neurological): Dermatomal pain pattern right side Palpation: No palpable anomalies       Provocative Tests: Hyperextension/rotation test: (+) bilaterally for facet joint pain. Lumbar quadrant test (Kemp's test): (+) on the right for foraminal stenosis Lateral bending test: deferred today       Patrick's Maneuver: (+) for right-sided S-I arthralgia             FABER* test: (+) for right-sided S-I arthralgia             S-I anterior distraction/compression test: deferred today         S-I lateral compression test: deferred today         S-I Thigh-thrust test: deferred today         S-I Gaenslen's test: deferred today         *(Flexion, ABduction and External Rotation)   Gait & Posture Assessment  Ambulation: Unassisted Gait: Antalgic gait (limping) Posture: WNL    Lower Extremity Exam  Side: Right lower extremity   Side: Left lower extremity  Stability: No instability observed           Stability: No instability observed          Skin & Extremity Inspection: Scar present from total knee arthroplasty   Skin & Extremity Inspection: Scar present from total knee arthroplasty  Functional ROM: Pain restricted ROM for hip and knee joints Limited SLR (straight leg raise)   Functional ROM: Pain restricted range of motion given total knee replacement.               Muscle Tone/Strength: Functionally intact. No obvious neuro-muscular anomalies detected.   Muscle Tone/Strength: Functionally intact. No obvious neuro-muscular anomalies detected.   Sensory (Neurological): Arthropathic arthralgia         Sensory (Neurological): Arthropathic arthralgia        DTR: Patellar: deferred today Achilles: deferred today Plantar: deferred today   DTR: Patellar: deferred today Achilles: deferred today Plantar: deferred today  Palpation: No palpable anomalies   Palpation: No palpable anomalies     Assessment   Status Diagnosis  Having a Flare-up Having a Flare-up Persistent 1. Lumbar radiculopathy (right)   2. Chronic radicular lumbar pain (right)   3. Neurogenic pain   4. Chronic pain syndrome   5. SI joint arthritis   6. Piriformis syndrome of right side   7. Sacroiliac joint pain        Plan of Care  [name] has a current medication list which includes the following long-term medication(s): orencia, albuterol, duloxetine, etanercept, enoxaparin, and pregabalin.  Pharmacotherapy (Medications Ordered): Meds ordered this encounter  Medications   pregabalin (LYRICA) 75 MG capsule    Sig: Take 1 capsule (75 mg total) by mouth 2 (two) times daily.    Dispense:  60 capsule    Refill:  5    Fill one day early if pharmacy is closed on scheduled refill date. May substitute for generic if available.   DULoxetine (CYMBALTA) 30 MG capsule    Sig: Take 1 capsule (30 mg total) by mouth daily.    Dispense:  30 capsule    Refill:  5   Orders Placed This Encounter  Procedures   Lumbar Epidural Injection    Standing Status:   Future    Standing Expiration Date:   03/22/2021    Scheduling Instructions:     Procedure: Interlaminar Lumbar Epidural Steroid injection (LESI)            Laterality: Midline     Sedation: Patient's choice.     Timeframe: ASAA    Order Specific Question:   Where will this procedure be performed?    Answer:   [facility]     Follow-up plan:   Return in about 2 weeks (around 03/05/2021) for L-ESI .     Right L5-S1 ESI 05/28/2020 only helped for 1 week, return of pain.  Consider  diagnostic lumbar facet medial branch nerve blocks in future.  Right sacroiliac joint, right piriformis injection 10/13/2020        Recent Visits Date Type Provider Dept  12/25/20 Office Visit Gillis Santa, MD [facility]  Showing recent visits within past 90 days and meeting all other requirements Today's Visits Date Type Provider Dept  02/19/21 Office Visit Gillis Santa, MD [facility]  Showing today's visits and meeting all other requirements Future Appointments Date Type Provider Dept  03/04/21 Appointment Gillis Santa, MD [facility]  Showing future appointments within next 90 days and meeting all other requirements I discussed the assessment and treatment plan with the patient. The patient was provided an opportunity to ask questions and all were answered. The patient agreed with the plan and demonstrated an understanding of the instructions.  Patient advised to call back or seek an in-person evaluation if the symptoms or condition worsens.  Duration of encounter: 30 minutes.  Note by: Gillis Santa, MD Date: 02/19/2021; Time: 11:38 AM

## 2021-02-19 NOTE — Patient Instructions (Signed)
GENERAL RISKS AND COMPLICATIONS ° °What are the risk, side effects and possible complications? °Generally speaking, most procedures are safe.  However, with any procedure there are risks, side effects, and the possibility of complications.  The risks and complications are dependent upon the sites that are lesioned, or the type of nerve block to be performed.  The closer the procedure is to the spine, the more serious the risks are.  Great care is taken when placing the radio frequency needles, block needles or lesioning probes, but sometimes complications can occur. °Infection: Any time there is an injection through the skin, there is a risk of infection.  This is why sterile conditions are used for these blocks.  There are four possible types of infection. °Localized skin infection. °Central Nervous System Infection-This can be in the form of Meningitis, which can be deadly. °Epidural Infections-This can be in the form of an epidural abscess, which can cause pressure inside of the spine, causing compression of the spinal cord with subsequent paralysis. This would require an emergency surgery to decompress, and there are no guarantees that the patient would recover from the paralysis. °Discitis-This is an infection of the intervertebral discs.  It occurs in about 1% of discography procedures.  It is difficult to treat and it may lead to surgery. ° °      2. Pain: the needles have to go through skin and soft tissues, will cause soreness. °      3. Damage to internal structures:  The nerves to be lesioned may be near blood vessels or   ° other nerves which can be potentially damaged. °      4. Bleeding: Bleeding is more common if the patient is taking blood thinners such as  aspirin, Coumadin, Ticiid, Plavix, etc., or if he/she have some genetic predisposition  such as hemophilia. Bleeding into the spinal canal can cause compression of the spinal  cord with subsequent paralysis.  This would require an emergency  surgery to  decompress and there are no guarantees that the patient would recover from the  paralysis. °      5. Pneumothorax:  Puncturing of a lung is a possibility, every time a needle is introduced in  the area of the chest or upper back.  Pneumothorax refers to free air around the  collapsed lung(s), inside of the thoracic cavity (chest cavity).  Another two possible  complications related to a similar event would include: Hemothorax and Chylothorax.   These are variations of the Pneumothorax, where instead of air around the collapsed  lung(s), you may have blood or chyle, respectively. °      6. Spinal headaches: They may occur with any procedures in the area of the spine. °      7. Persistent CSF (Cerebro-Spinal Fluid) leakage: This is a rare problem, but may occur  with prolonged intrathecal or epidural catheters either due to the formation of a fistulous  track or a dural tear. °      8. Nerve damage: By working so close to the spinal cord, there is always a possibility of  nerve damage, which could be as serious as a permanent spinal cord injury with  paralysis. °      9. Death:  Although rare, severe deadly allergic reactions known as "Anaphylactic  reaction" can occur to any of the medications used. °     10. Worsening of the symptoms:  We can always make thing worse. ° °What are the chances   of something like this happening? °Chances of any of this occuring are extremely low.  By statistics, you have more of a chance of getting killed in a motor vehicle accident: while driving to the hospital than any of the above occurring .  Nevertheless, you should be aware that they are possibilities.  In general, it is similar to taking a shower.  Everybody knows that you can slip, hit your head and get killed.  Does that mean that you should not shower again?  Nevertheless always keep in mind that statistics do not mean anything if you happen to be on the wrong side of them.  Even if a procedure has a 1 (one) in a  1,000,000 (million) chance of going wrong, it you happen to be that one..Also, keep in mind that by statistics, you have more of a chance of having something go wrong when taking medications. ° °Who should not have this procedure? °If you are on a blood thinning medication (e.g. Coumadin, Plavix, see list of "Blood Thinners"), or if you have an active infection going on, you should not have the procedure.  If you are taking any blood thinners, please inform your physician. ° °How should I prepare for this procedure? °Do not eat or drink anything at least six hours prior to the procedure. °Bring a driver with you .  It cannot be a taxi. °Come accompanied by an adult that can drive you back, and that is strong enough to help you if your legs get weak or numb from the local anesthetic. °Take all of your medicines the morning of the procedure with just enough water to swallow them. °If you have diabetes, make sure that you are scheduled to have your procedure done first thing in the morning, whenever possible. °If you have diabetes, take only half of your insulin dose and notify our nurse that you have done so as soon as you arrive at the clinic. °If you are diabetic, but only take blood sugar pills (oral hypoglycemic), then do not take them on the morning of your procedure.  You may take them after you have had the procedure. °Do not take aspirin or any aspirin-containing medications, at least eleven (11) days prior to the procedure.  They may prolong bleeding. °Wear loose fitting clothing that may be easy to take off and that you would not mind if it got stained with Betadine or blood. °Do not wear any jewelry or perfume °Remove any nail coloring.  It will interfere with some of our monitoring equipment. ° °NOTE: Remember that this is not meant to be interpreted as a complete list of all possible complications.  Unforeseen problems may occur. ° °BLOOD THINNERS °The following drugs contain aspirin or other products,  which can cause increased bleeding during surgery and should not be taken for 2 weeks prior to and 1 week after surgery.  If you should need take something for relief of minor pain, you may take acetaminophen which is found in Tylenol,m Datril, Anacin-3 and Panadol. It is not blood thinner. The products listed below are.  Do not take any of the products listed below in addition to any listed on your instruction sheet. ° °A.P.C or A.P.C with Codeine Codeine Phosphate Capsules #3 Ibuprofen Ridaura  °ABC compound Congesprin Imuran rimadil  °Advil Cope Indocin Robaxisal  °Alka-Seltzer Effervescent Pain Reliever and Antacid Coricidin or Coricidin-D ° Indomethacin Rufen  °Alka-Seltzer plus Cold Medicine Cosprin Ketoprofen S-A-C Tablets  °Anacin Analgesic Tablets or Capsules Coumadin   Korlgesic Salflex  Anacin Extra Strength Analgesic tablets or capsules CP-2 Tablets Lanoril Salicylate  Anaprox Cuprimine Capsules Levenox Salocol  Anexsia-D Dalteparin Magan Salsalate  Anodynos Darvon compound Magnesium Salicylate Sine-off  Ansaid Dasin Capsules Magsal Sodium Salicylate  Anturane Depen Capsules Marnal Soma  APF Arthritis pain formula Dewitt's Pills Measurin Stanback  Argesic Dia-Gesic Meclofenamic Sulfinpyrazone  Arthritis Bayer Timed Release Aspirin Diclofenac Meclomen Sulindac  Arthritis pain formula Anacin Dicumarol Medipren Supac  Analgesic (Safety coated) Arthralgen Diffunasal Mefanamic Suprofen  Arthritis Strength Bufferin Dihydrocodeine Mepro Compound Suprol  Arthropan liquid Dopirydamole Methcarbomol with Aspirin Synalgos  ASA tablets/Enseals Disalcid Micrainin Tagament  Ascriptin Doan's Midol Talwin  Ascriptin A/D Dolene Mobidin Tanderil  Ascriptin Extra Strength Dolobid Moblgesic Ticlid  Ascriptin with Codeine Doloprin or Doloprin with Codeine Momentum Tolectin  Asperbuf Duoprin Mono-gesic Trendar  Aspergum Duradyne Motrin or Motrin IB Triminicin  Aspirin plain, buffered or enteric coated  Durasal Myochrisine Trigesic  Aspirin Suppositories Easprin Nalfon Trillsate  Aspirin with Codeine Ecotrin Regular or Extra Strength Naprosyn Uracel  Atromid-S Efficin Naproxen Ursinus  Auranofin Capsules Elmiron Neocylate Vanquish  Axotal Emagrin Norgesic Verin  Azathioprine Empirin or Empirin with Codeine Normiflo Vitamin E  Azolid Emprazil Nuprin Voltaren  Bayer Aspirin plain, buffered or children's or timed BC Tablets or powders Encaprin Orgaran Warfarin Sodium  Buff-a-Comp Enoxaparin Orudis Zorpin  Buff-a-Comp with Codeine Equegesic Os-Cal-Gesic   Buffaprin Excedrin plain, buffered or Extra Strength Oxalid   Bufferin Arthritis Strength Feldene Oxphenbutazone   Bufferin plain or Extra Strength Feldene Capsules Oxycodone with Aspirin   Bufferin with Codeine Fenoprofen Fenoprofen Pabalate or Pabalate-SF   Buffets II Flogesic Panagesic   Buffinol plain or Extra Strength Florinal or Florinal with Codeine Panwarfarin   Buf-Tabs Flurbiprofen Penicillamine   Butalbital Compound Four-way cold tablets Penicillin   Butazolidin Fragmin Pepto-Bismol   Carbenicillin Geminisyn Percodan   Carna Arthritis Reliever Geopen Persantine   Carprofen Gold's salt Persistin   Chloramphenicol Goody's Phenylbutazone   Chloromycetin Haltrain Piroxlcam   Clmetidine heparin Plaquenil   Cllnoril Hyco-pap Ponstel   Clofibrate Hydroxy chloroquine Propoxyphen         Before stopping any of these medications, be sure to consult the physician who ordered them.  Some, such as Coumadin (Warfarin) are ordered to prevent or treat serious conditions such as "deep thrombosis", "pumonary embolisms", and other heart problems.  The amount of time that you may need off of the medication may also vary with the medication and the reason for which you were taking it.  If you are taking any of these medications, please make sure you notify your pain physician before you undergo any procedures.       Moderate Conscious  Sedation, Adult Sedation is the use of medicines to promote relaxation and to relieve discomfort and anxiety. Moderate conscious sedation is a type of sedation. Under moderate conscious sedation, you are less alert than normal, but you arestill able to respond to instructions, touch, or both. Moderate conscious sedation is used during short medical and dental procedures. It is milder than deep sedation, which is a type of sedation under which you cannot be easily woken up. It is also milder than general anesthesia, which is the use of medicines to make you unconscious. Moderate conscious sedationallows you to return to your regular activities sooner. Tell a health care provider about: Any allergies you have. All medicines you are taking, including vitamins, herbs, eye drops, creams, and over-the-counter medicines. Any use of steroids. This includes steroids taken by mouth  or as a cream. Any problems you or family members have had with sedatives and anesthetic medicines. Any blood disorders you have. Any surgeries you have had. Any medical conditions you have, such as sleep apnea. Whether you are pregnant or may be pregnant. Any use of cigarettes, alcohol, marijuana, or drugs. What are the risks? Generally, this is a safe procedure. However, problems may occur, including: Getting too much medicine (oversedation). Nausea. Allergic reaction to medicines. Trouble breathing. If this happens, a breathing tube may be used. It will be removed when you are awake and breathing on your own. Heart trouble. Lung trouble. Confusion that gets better with time (emergence delirium). What happens before the procedure? Staying hydrated Follow instructions from your health care provider about hydration, which may include: Up to 2 hours before the procedure - you may continue to drink clear liquids, such as water, clear fruit juice, black coffee, and plain tea. Eating and drinking restrictions Follow  instructions from your health care provider about eating and drinking, which may include: 8 hours before the procedure - stop eating heavy meals or foods, such as meat, fried foods, or fatty foods. 6 hours before the procedure - stop eating light meals or foods, such as toast or cereal. 6 hours before the procedure - stop drinking milk or drinks that contain milk. 2 hours before the procedure - stop drinking clear liquids. Medicines Ask your health care provider about: Changing or stopping your regular medicines. This is especially important if you are taking diabetes medicines or blood thinners. Taking medicines such as aspirin and ibuprofen. These medicines can thin your blood. Do not take these medicines unless your health care provider tells you to take them. Taking over-the-counter medicines, vitamins, herbs, and supplements. Tests and exams You will have a physical exam. You may have blood tests done to show how well: Your kidneys and liver work. Your blood clots. General instructions Plan to have a responsible adult take you home from the hospital or clinic. If you will be going home right after the procedure, plan to have a responsible adult care for you for the time you are told. This is important. What happens during the procedure?  You will be given the sedative. The sedative may be given: As a pill that you will swallow. It can also be inserted into the rectum. As a spray through the nose. As an injection into the muscle. As an injection into the vein through an IV. You may be given oxygen as needed. Your breathing, heart rate, and blood pressure will be monitored during the procedure. The medical or dental procedure will be done. The procedure may vary among health care providers and hospitals. What happens after the procedure? Your blood pressure, heart rate, breathing rate, and blood oxygen level will be monitored until you leave the hospital or clinic. You will get  fluids through your IV if needed. Do not drive or operate machinery until your health care provider says that it is safe. Summary Sedation is the use of medicines to promote relaxation and to relieve discomfort and anxiety. Moderate conscious sedation is a type of sedation that is used during short medical and dental procedures. Tell the health care provider about any medical conditions that you have and about all the medicines that you are taking. You will be given the sedative as a pill, a spray through the nose, an injection into the muscle, or an injection into the vein through an IV. Vital signs are monitored during  the sedation. Moderate conscious sedation allows you to return to your regular activities sooner. This information is not intended to replace advice given to you by your health care provider. Make sure you discuss any questions you have with your healthcare provider. Document Revised: 11/16/2019 Document Reviewed: 06/14/2019 Elsevier Patient Education  2022 [employer].    Epidural Steroid Injection Patient Information  Description: The epidural space surrounds the nerves as they exit the spinal cord.  In some patients, the nerves can be compressed and inflamed by a bulging disc or a tight spinal canal (spinal stenosis).  By injecting steroids into the epidural space, we can bring irritated nerves into direct contact with a potentially helpful medication.  These steroids act directly on the irritated nerves and can reduce swelling and inflammation which often leads to decreased pain.  Epidural steroids may be injected anywhere along the spine and from the neck to the low back depending upon the location of your pain.   After numbing the skin with local anesthetic (like Novocaine), a small needle is passed into the epidural space slowly.  You may experience a sensation of pressure while this is being done.  The entire block usually last less than 10 minutes.  Conditions which may  be treated by epidural steroids:  Low back and leg pain Neck and arm pain Spinal stenosis Post-laminectomy syndrome Herpes zoster (shingles) pain Pain from compression fractures  Preparation for the injection:  Do not eat any solid food or dairy products within 8 hours of your appointment.  You may drink clear liquids up to 3 hours before appointment.  Clear liquids include water, black coffee, juice or soda.  No milk or cream please. You may take your regular medication, including pain medications, with a sip of water before your appointment  Diabetics should hold regular insulin (if taken separately) and take 1/2 normal NPH dos the morning of the procedure.  Carry some sugar containing items with you to your appointment. A driver must accompany you and be prepared to drive you home after your procedure.  Bring all your current medications with your. An IV may be inserted and sedation may be given at the discretion of the physician.   A blood pressure cuff, EKG and other monitors will often be applied during the procedure.  Some patients may need to have extra oxygen administered for a short period. You will be asked to provide medical information, including your allergies, prior to the procedure.  We must know immediately if you are taking blood thinners (like Coumadin/Warfarin)  Or if you are allergic to IV iodine contrast (dye). We must know if you could possible be pregnant.  Possible side-effects: Bleeding from needle site Infection (rare, may require surgery) Nerve injury (rare) Numbness & tingling (temporary) Difficulty urinating (rare, temporary) Spinal headache ( a headache worse with upright posture) Light -headedness (temporary) Pain at injection site (several days) Decreased blood pressure (temporary) Weakness in arm/leg (temporary) Pressure sensation in back/neck (temporary)  Call if you experience: Fever/chills associated with headache or increased back/neck  pain. Headache worsened by an upright position. New onset weakness or numbness of an extremity below the injection site Hives or difficulty breathing (go to the emergency room) Inflammation or drainage at the infection site Severe back/neck pain Any new symptoms which are concerning to you  Please note:  Although the local anesthetic injected can often make your back or neck feel good for several hours after the injection, the pain will likely return.  It takes 3-7 days for steroids to work in the epidural space.  You may not notice any pain relief for at least that one week.  If effective, we will often do a series of three injections spaced 3-6 weeks apart to maximally decrease your pain.  After the initial series, we generally will wait several months before considering a repeat injection of the same type.  If you have any questions, please call [phone] [facility]

## 2021-02-20 DIAGNOSIS — M6281 Muscle weakness (generalized): Secondary | ICD-10-CM | POA: Diagnosis not present

## 2021-02-20 DIAGNOSIS — M25562 Pain in left knee: Secondary | ICD-10-CM | POA: Diagnosis not present

## 2021-02-20 DIAGNOSIS — M25662 Stiffness of left knee, not elsewhere classified: Secondary | ICD-10-CM | POA: Diagnosis not present

## 2021-02-20 DIAGNOSIS — Z96652 Presence of left artificial knee joint: Secondary | ICD-10-CM | POA: Diagnosis not present

## 2021-02-23 ENCOUNTER — Other Ambulatory Visit: Payer: Self-pay | Admitting: Student in an Organized Health Care Education/Training Program

## 2021-02-23 ENCOUNTER — Telehealth: Payer: Self-pay

## 2021-02-23 DIAGNOSIS — Z1211 Encounter for screening for malignant neoplasm of colon: Secondary | ICD-10-CM

## 2021-02-23 NOTE — Addendum Note (Signed)
Addended by: Marnee Guarneri T on: 02/23/2021 04:57 PM   Modules accepted: Orders

## 2021-02-23 NOTE — Telephone Encounter (Signed)
Copied from [place] [id number]. Topic: Appointment Scheduling - Scheduling Inquiry for Clinic >> Feb 23, 2021 10:33 AM Celene Kras wrote: Reason for CRM: Pt calling stating that she received a call from the office requesting to have her scheduled for a colonoscopy. Please advise.

## 2021-02-23 NOTE — Telephone Encounter (Signed)
Do you recommend that patient get scheduled for a colonoscopy.

## 2021-02-24 DIAGNOSIS — Z96652 Presence of left artificial knee joint: Secondary | ICD-10-CM | POA: Diagnosis not present

## 2021-02-24 NOTE — Telephone Encounter (Signed)
Called and left a detailed message for patient with Jolene's response.

## 2021-02-25 ENCOUNTER — Other Ambulatory Visit: Payer: Self-pay | Admitting: Gastroenterology

## 2021-02-25 ENCOUNTER — Other Ambulatory Visit: Payer: Self-pay

## 2021-02-25 MED ORDER — NA SULFATE-K SULFATE-MG SULF 17.5-3.13-1.6 GM/177ML PO SOLN: 1.0000 | ORAL | 0 refills | Status: DC

## 2021-02-27 DIAGNOSIS — Z96652 Presence of left artificial knee joint: Secondary | ICD-10-CM | POA: Diagnosis not present

## 2021-03-02 ENCOUNTER — Other Ambulatory Visit: Payer: Self-pay

## 2021-03-02 ENCOUNTER — Encounter: Payer: Self-pay | Admitting: Student in an Organized Health Care Education/Training Program

## 2021-03-02 ENCOUNTER — Ambulatory Visit
Admission: RE | Admit: 2021-03-02 | Discharge: 2021-03-02 | Disposition: A | Discharge status: Home / Self Care | Payer: BC Managed Care – PPO | Source: Ambulatory Visit | Attending: Student in an Organized Health Care Education/Training Program | Admitting: Student in an Organized Health Care Education/Training Program

## 2021-03-02 ENCOUNTER — Ambulatory Visit (HOSPITAL_BASED_OUTPATIENT_CLINIC_OR_DEPARTMENT_OTHER): Payer: BC Managed Care – PPO | Admitting: Student in an Organized Health Care Education/Training Program

## 2021-03-02 VITALS — BP 122/90 | HR 92 | Temp 96.9°F | Resp 20 | Ht 69.0 in | Wt 279.0 lb

## 2021-03-02 DIAGNOSIS — G8929 Other chronic pain: Secondary | ICD-10-CM | POA: Insufficient documentation

## 2021-03-02 DIAGNOSIS — G894 Chronic pain syndrome: Secondary | ICD-10-CM | POA: Diagnosis not present

## 2021-03-02 DIAGNOSIS — M5416 Radiculopathy, lumbar region: Secondary | ICD-10-CM | POA: Insufficient documentation

## 2021-03-02 MED ORDER — SODIUM CHLORIDE 0.9% FLUSH
2.0000 mL | Freq: Once | INTRAVENOUS | Status: AC
  Administered 2021-03-02: 10 mL

## 2021-03-02 MED ORDER — IOHEXOL 180 MG/ML  SOLN
10.0000 mL | Freq: Once | INTRAMUSCULAR | Status: AC
  Administered 2021-03-02: 5 mL via EPIDURAL
  Filled 2021-03-02: qty 20

## 2021-03-02 MED ORDER — DEXAMETHASONE SODIUM PHOSPHATE 10 MG/ML IJ SOLN
10.0000 mg | Freq: Once | INTRAMUSCULAR | Status: AC
  Administered 2021-03-02: 10 mg
  Filled 2021-03-02: qty 1

## 2021-03-02 MED ORDER — LIDOCAINE HCL 2 % IJ SOLN
20.0000 mL | Freq: Once | INTRAMUSCULAR | Status: AC
  Administered 2021-03-02: 100 mg

## 2021-03-02 MED ORDER — ROPIVACAINE HCL 2 MG/ML IJ SOLN
2.0000 mL | Freq: Once | INTRAMUSCULAR | Status: AC
  Administered 2021-03-02: 20 mL via EPIDURAL

## 2021-03-02 MED ORDER — LIDOCAINE HCL (PF) 2 % IJ SOLN
INTRAMUSCULAR | Status: AC
  Filled 2021-03-02: qty 5

## 2021-03-02 MED ORDER — SODIUM CHLORIDE (PF) 0.9 % IJ SOLN
INTRAMUSCULAR | Status: AC
  Filled 2021-03-02: qty 10

## 2021-03-02 MED ORDER — ROPIVACAINE HCL 2 MG/ML IJ SOLN
INTRAMUSCULAR | Status: AC
  Filled 2021-03-02: qty 20

## 2021-03-02 NOTE — Patient Instructions (Signed)

## 2021-03-02 NOTE — Progress Notes (Signed)
Safety precautions to be maintained throughout the outpatient stay will include: orient to surroundings, keep bed in low position, maintain call bell within reach at all times, provide assistance with transfer out of bed and ambulation.  

## 2021-03-02 NOTE — Progress Notes (Signed)
PROVIDER NOTE: Information contained herein reflects review and annotations entered in association with encounter. Interpretation of such information and data should be left to medically-trained personnel. Information provided to patient can be located elsewhere in the medical record under "Patient Instructions". Document created using STT-dictation technology, any transcriptional errors that may result from process are unintentional.    Patient: Erin Contreras  Service Category: Contreras  Provider: Gillis Santa, MD  DOB: 21-Sep-1960  DOS: 03/02/2021  Location: [facility]  MRN: [id number]  Setting: Ambulatory - outpatient  Referring Provider: Venita Lick, NP  Type: Established Patient  Specialty: Interventional Pain Management  PCP: Venita Lick, NP   Primary Reason for Visit: Interventional Pain Management Treatment. CC: Back Pain  Contreras:          Anesthesia, Analgesia, Anxiolysis:  Type: Diagnostic Inter-Laminar Epidural Steroid Injection  #2  Region: Lumbar Level: L5-S1 Level. Laterality: Right         Type: Local Anesthesia  Local Anesthetic: Lidocaine 1-2%  Position: Prone with head of the table was raised to facilitate breathing.   Indications: 1. Lumbar radiculopathy (right)   2. Chronic radicular lumbar pain (right)   3. Chronic pain syndrome     Pain Score: Pre-Contreras: 7 /10 Post-Contreras: 0-No pain/10   Pre-op Assessment:  Erin Contreras is a 60 y.o. (year old), female patient, seen today for interventional treatment. She  has a past surgical history that includes Cholecystectomy (2011); Tumor removal (Right, 2010); Sleeve Gastroplasty; Fracture surgery (Right); Knee Arthroplasty (Right, 07/11/2017); Knee Arthroplasty (Left, 01/28/2021); and Joint replacement (Left, 01/28/2021). Erin Contreras has a current medication list which includes the following prescription(s): orencia, acyclovir, albuterol, celecoxib, vitamin d3, vitamin b-12, diclofenac  sodium, duloxetine, etanercept, folic acid, hydrocodone-acetaminophen, menthol (topical analgesic), methotrexate, na sulfate-k sulfate-mg sulf, and pregabalin. Her primarily concern today is the Back Pain  Initial Vital Signs:  Pulse/HCG Rate: 92ECG Heart Rate: 86 Temp: (!) 96.9 F (36.1 C) Resp: 18 BP: 122/75 SpO2: 100 %  BMI: Estimated body mass index is 41.2 kg/m as calculated from the following:   Height as of this encounter: '5\' 9"'$  (1.753 m).   Weight as of this encounter: 279 lb (126.6 kg).  Risk Assessment: Allergies: Reviewed. She has No Known Allergies.  Allergy Precautions: None required Coagulopathies: Reviewed. None identified.  Blood-thinner therapy: None at this time Active Infection(s): Reviewed. None identified. Erin Contreras is afebrile  Site Confirmation: Erin Contreras was asked to confirm the Contreras and laterality before marking the site Contreras checklist: Completed Consent: Before the Contreras and under the influence of no sedative(s), amnesic(s), or anxiolytics, the patient was informed of the treatment options, risks and possible complications. To fulfill our ethical and legal obligations, as recommended by the American Medical Association's Code of Ethics, I have informed the patient of my clinical impression; the nature and purpose of the treatment or Contreras; the risks, benefits, and possible complications of the intervention; the alternatives, including doing nothing; the risk(s) and benefit(s) of the alternative treatment(s) or Contreras(s); and the risk(s) and benefit(s) of doing nothing. The patient was provided information about the general risks and possible complications associated with the Contreras. These may include, but are not limited to: failure to achieve desired goals, infection, bleeding, organ or nerve damage, allergic reactions, paralysis, and death. In addition, the patient was informed of those risks and complications associated to Spine-related  procedures, such as failure to decrease pain; infection (i.e.: Meningitis, epidural or intraspinal abscess); bleeding (i.e.: epidural hematoma,  subarachnoid hemorrhage, or any other type of intraspinal or peri-dural bleeding); organ or nerve damage (i.e.: Any type of peripheral nerve, nerve root, or spinal cord injury) with subsequent damage to sensory, motor, and/or autonomic systems, resulting in permanent pain, numbness, and/or weakness of one or several areas of the body; allergic reactions; (i.e.: anaphylactic reaction); and/or death. Furthermore, the patient was informed of those risks and complications associated with the medications. These include, but are not limited to: allergic reactions (i.e.: anaphylactic or anaphylactoid reaction(s)); adrenal axis suppression; blood sugar elevation that in diabetics may result in ketoacidosis or comma; water retention that in patients with history of congestive heart failure may result in shortness of breath, pulmonary edema, and decompensation with resultant heart failure; weight gain; swelling or edema; medication-induced neural toxicity; particulate matter embolism and blood vessel occlusion with resultant organ, and/or nervous system infarction; and/or aseptic necrosis of one or more joints. Finally, the patient was informed that Medicine is not an exact science; therefore, there is also the possibility of unforeseen or unpredictable risks and/or possible complications that may result in a catastrophic outcome. The patient indicated having understood very clearly. We have given the patient no guarantees and we have made no promises. Enough time was given to the patient to ask questions, all of which were answered to the patient's satisfaction. Erin Contreras. Attestation: I, the ordering provider, attest that I have discussed with the patient the benefits, risks, side-effects, alternatives, likelihood of  achieving goals, and potential problems during recovery for the Contreras that I have provided informed consent. Date  Time: 03/02/2021 10:42 AM  Pre-Contreras Preparation:  Monitoring: As per clinic protocol. Respiration, ETCO2, SpO2, BP, heart rate and rhythm monitor placed and checked for adequate function Safety Precautions: Patient was assessed for positional comfort and pressure points before starting the Contreras. Time-out: I initiated and conducted the "Time-out" before starting the Contreras, as per protocol. The patient was asked to participate by confirming the accuracy of the "Time Out" information. Verification of the correct person, site, and Contreras were performed and confirmed by me, the nursing staff, and the patient. "Time-out" conducted as per Joint Commission's Universal Protocol (UP.01.01.01). Time: 1117  Description of Contreras:          Target Area: The interlaminar space, initially targeting the lower laminar border of the superior vertebral body. Approach: Paramedial approach. Area Prepped: Entire Posterior Lumbar Region DuraPrep (Iodine Povacrylex [0.7% available iodine] and Isopropyl Alcohol, 74% w/w) Safety Precautions: Aspiration looking for blood return was conducted prior to all injections. At no point did we inject any substances, as a needle was being advanced. No attempts were made at seeking any paresthesias. Safe injection practices and needle disposal techniques used. Medications properly checked for expiration dates. SDV (single dose vial) medications used. Description of the Contreras: Protocol guidelines were followed. The Contreras needle was introduced through the skin, ipsilateral to the reported pain, and advanced to the target area. Bone was contacted and the needle walked caudad, until the lamina was cleared. The epidural space was identified using "loss-of-resistance technique" with 2-3 ml of PF-NaCl (0.9% NSS), in a 5cc LOR glass syringe.  Vitals:    03/02/21 1047 03/02/21 1120 03/02/21 1122  BP: 122/75 133/88 122/90  Pulse: 92    Resp: '18 20 20  '$ Temp: (!) 96.9 F (36.1 C)    TempSrc: Temporal    SpO2: 100% 100% 98%  Weight: 279 lb (126.6 kg)  Height: '5\' 9"'$  (1.753 m)      Start Time: 1117 hrs. End Time: 1121 hrs.  Materials:  Needle(s) Type: Epidural needle Gauge: 17G Length: 5-in Medication(s): Please see orders for medications and dosing details. 6 cc solution made of 3 cc of preservative-free saline, 2 cc of 0.2% ropivacaine, 1 cc of Decadron 10 mg/cc.  Imaging Guidance (Spinal):          Type of Imaging Technique: Fluoroscopy Guidance (Spinal) Indication(s): Assistance in needle guidance and placement for procedures requiring needle placement in or near specific anatomical locations not easily accessible without such assistance. Exposure Time: Please see nurses notes. Contrast: Before injecting any contrast, we confirmed that the patient did not have an allergy to iodine, shellfish, or radiological contrast. Once satisfactory needle placement was completed at the desired level, radiological contrast was injected. Contrast injected under live fluoroscopy. No contrast complications. See chart for type and volume of contrast used. Fluoroscopic Guidance: I was personally present during the use of fluoroscopy. "Tunnel Vision Technique" used to obtain the best possible view of the target area. Parallax error corrected before commencing the Contreras. "Direction-depth-direction" technique used to introduce the needle under continuous pulsed fluoroscopy. Once target was reached, antero-posterior, oblique, and lateral fluoroscopic projection used confirm needle placement in all planes. Images permanently stored in EMR. Interpretation: I personally interpreted the imaging intraoperatively. Adequate needle placement confirmed in multiple planes. Appropriate spread of contrast into desired area was observed. No evidence of afferent or  efferent intravascular uptake. No intrathecal or subarachnoid spread observed. Permanent images saved into the patient's record.   Post-operative Assessment:  Post-Contreras Vital Signs:  Pulse/HCG Rate: 9279 Temp: (!) 96.9 F (36.1 C) Resp: 20 BP: 122/90 SpO2: 98 %  EBL: None  Complications: No immediate post-treatment complications observed by team, or reported by patient.  Note: The patient tolerated the entire Contreras well. A repeat set of vitals were taken after the Contreras and the patient was kept under observation following institutional policy, for this type of Contreras. Post-procedural neurological assessment was performed, showing return to baseline, prior to discharge. The patient was provided with post-Contreras discharge instructions, including a section on how to identify potential problems. Should any problems arise concerning this Contreras, the patient was given instructions to immediately contact us, at any time, without hesitation. In any case, we plan to contact the patient by telephone for a follow-up status report regarding this interventional Contreras.  Comments:  No additional relevant information.  5 out of 5 strength bilateral lower extremity: Plantar flexion, dorsiflexion, knee flexion, knee extension.  Plan of Care  Orders:  Orders Placed This Encounter  Procedures   DG PAIN CLINIC C-ARM 1-60 MIN NO REPORT    Intraoperative interpretation by procedural physician at [facility].    Standing Status:   Standing    Number of Occurrences:   1    Order Specific Question:   Reason for exam:    Answer:   Assistance in needle guidance and placement for procedures requiring needle placement in or near specific anatomical locations not easily accessible without such assistance.     Medications ordered for Contreras: Meds ordered this encounter  Medications   iohexol (OMNIPAQUE) 180 MG/ML injection 10 mL    Must be Myelogram-compatible. If not  available, you may substitute with a water-soluble, non-ionic, hypoallergenic, myelogram-compatible radiological contrast medium.   lidocaine (XYLOCAINE) 2 % (with pres) injection 400 mg   ropivacaine (PF) 2 mg/mL (0.2%) (NAROPIN) injection 2 mL  sodium chloride flush (NS) 0.9 % injection 2 mL   dexamethasone (DECADRON) injection 10 mg    Medications administered: We administered iohexol, lidocaine, ropivacaine (PF) 2 mg/mL (0.2%), sodium chloride flush, and dexamethasone.  See the medical record for exact dosing, route, and time of administration.  Follow-up plan:   Return in about 6 weeks (around 04/13/2021) for Post Contreras Evaluation, virtual.      Right L5-S1 ESI 05/28/2020   Recent Visits Date Type Provider Dept  02/19/21 Office Visit Gillis Santa, MD [facility]  12/25/20 Office Visit Gillis Santa, MD [facility]  Showing recent visits within past 90 days and meeting all other requirements Today's Visits Date Type Provider Dept  03/02/21 Contreras visit Gillis Santa, MD [facility]  Showing today's visits and meeting all other requirements Future Appointments No visits were found meeting these conditions. Showing future appointments within next 90 days and meeting all other requirements Disposition: Discharge home  Discharge (Date  Time): 03/02/2021; 1130 hrs.   Primary Care Physician: Venita Lick, NP Location: [facility] Note by: Gillis Santa, MD Date: 03/02/2021; Time: 11:25 AM  Disclaimer:  Medicine is not an exact science. The only guarantee in medicine is that nothing is guaranteed. It is important to note that the decision to proceed with this intervention was based on the information collected from the patient. The Data and conclusions were drawn from the patient's questionnaire, the interview, and the physical examination. Because the information was provided in large part by the patient, it cannot  be guaranteed that it has not been purposely or unconsciously manipulated. Every effort has been made to obtain as much relevant data as possible for this evaluation. It is important to note that the conclusions that lead to this Contreras are derived in large part from the available data. Always take into account that the treatment will also be dependent on availability of resources and existing treatment guidelines, considered by other Pain Management Practitioners as being common knowledge and practice, at the time of the intervention. For Medico-Legal purposes, it is also important to point out that variation in procedural techniques and pharmacological choices are the acceptable norm. The indications, contraindications, technique, and results of the above Contreras should only be interpreted and judged by a Board-Certified Interventional Pain Specialist with extensive familiarity and expertise in the same exact Contreras and technique.

## 2021-03-03 ENCOUNTER — Ambulatory Visit: Payer: Self-pay

## 2021-03-03 ENCOUNTER — Ambulatory Visit (INDEPENDENT_AMBULATORY_CARE_PROVIDER_SITE_OTHER): Payer: BC Managed Care – PPO | Admitting: Internal Medicine

## 2021-03-03 ENCOUNTER — Telehealth: Payer: Self-pay | Admitting: *Deleted

## 2021-03-03 ENCOUNTER — Encounter: Payer: Self-pay | Admitting: Internal Medicine

## 2021-03-03 VITALS — BP 106/70 | HR 88 | Temp 98.3°F | Ht 69.02 in | Wt 286.8 lb

## 2021-03-03 DIAGNOSIS — R42 Dizziness and giddiness: Secondary | ICD-10-CM

## 2021-03-03 DIAGNOSIS — R350 Frequency of micturition: Secondary | ICD-10-CM | POA: Diagnosis not present

## 2021-03-03 NOTE — Telephone Encounter (Signed)
Denies any post procedure issues. 

## 2021-03-03 NOTE — Progress Notes (Signed)
BP 106/70   Pulse 88   Temp 98.3 F (36.8 C) (Oral)   Ht 5' 9.02" (1.753 m)   Wt 286 lb 12.8 oz (130.1 kg)   SpO2 96%   BMI 42.33 kg/m    Subjective:    Patient ID: Erin Contreras, female    DOB: 1960-12-31, 60 y.o.   MRN: [id number]  Chief Complaint  Patient presents with   Dizziness    Started about 5 days ago.   Migraine   Fatigue   Cough   Heartburn    HPI: Erin Contreras is a 60 y.o. female  Dizziness This is a new problem. Associated symptoms include congestion, coughing, fatigue and headaches. Pertinent negatives include no abdominal pain, anorexia, arthralgias, change in bowel habit, chest pain, chills, fever, joint swelling, myalgias, nausea, rash, sore throat, swollen glands, urinary symptoms, vertigo or visual change. Associated symptoms comments: Has been coughing .  Migraine  Associated symptoms include coughing and dizziness. Pertinent negatives include no abdominal pain, anorexia, fever, nausea, sore throat, swollen glands or visual change.  Cough Associated symptoms include headaches and heartburn. Pertinent negatives include no chest pain, chills, fever, myalgias, rash or sore throat.  Heartburn She complains of coughing and heartburn. She reports no abdominal pain, no chest pain, no nausea or no sore throat. Associated symptoms include fatigue.   Chief Complaint  Patient presents with   Dizziness    Started about 5 days ago.   Migraine   Fatigue   Cough   Heartburn    Relevant past medical, surgical, family and social history reviewed and updated as indicated. Interim medical history since our last visit reviewed. Allergies and medications reviewed and updated.  Review of Systems  Constitutional:  Positive for fatigue. Negative for chills and fever.  HENT:  Positive for congestion. Negative for sore throat.   Respiratory:  Positive for cough.   Cardiovascular:  Negative for chest pain.  Gastrointestinal:  Positive for heartburn. Negative for  abdominal pain, anorexia, change in bowel habit and nausea.  Musculoskeletal:  Negative for arthralgias, joint swelling and myalgias.  Skin:  Negative for rash.  Neurological:  Positive for dizziness and headaches. Negative for vertigo.   Per HPI unless specifically indicated above     Objective:    BP 106/70   Pulse 88   Temp 98.3 F (36.8 C) (Oral)   Ht 5' 9.02" (1.753 m)   Wt 286 lb 12.8 oz (130.1 kg)   SpO2 96%   BMI 42.33 kg/m   Wt Readings from Last 3 Encounters:  03/03/21 286 lb 12.8 oz (130.1 kg)  03/02/21 279 lb (126.6 kg)  02/19/21 279 lb (126.6 kg)    Physical Exam Vitals and nursing note reviewed.  Constitutional:      General: She is not in acute distress.    Appearance: Normal appearance. She is not ill-appearing or diaphoretic.  Eyes:     Conjunctiva/sclera: Conjunctivae normal.  Pulmonary:     Breath sounds: No rhonchi.  Abdominal:     General: Abdomen is flat. Bowel sounds are normal. There is no distension.     Palpations: Abdomen is soft. There is no mass.     Tenderness: There is no abdominal tenderness. There is no guarding.  Skin:    General: Skin is warm and dry.     Coloration: Skin is not jaundiced.     Findings: No erythema.  Neurological:     Mental Status: She is alert.  Results for orders placed or performed during the hospital encounter of 01/26/21  SARS CORONAVIRUS 2 (TAT 6-24 HRS) Nasopharyngeal Nasopharyngeal Swab   Specimen: Nasopharyngeal Swab  Result Value Ref Range   SARS Coronavirus 2 NEGATIVE NEGATIVE        Current Outpatient Medications:    abatacept (ORENCIA) 250 MG injection, Inject into the vein every 30 (thirty) days., Disp: , Rfl:    acyclovir (ZOVIRAX) 400 MG tablet, TAKE 1 TABLET BY MOUTH TWICE A DAY AS NEEDED, Disp: 180 tablet, Rfl: 0   albuterol (VENTOLIN HFA) 108 (90 Base) MCG/ACT inhaler, Inhale 2 puffs into the lungs every 6 (six) hours as needed for wheezing or shortness of breath., Disp: 18 g, Rfl: 1    celecoxib (CELEBREX) 200 MG capsule, Take 200 mg by mouth 2 (two) times daily., Disp: , Rfl:    diclofenac Sodium (VOLTAREN) 1 % GEL, Apply topically 4 (four) times daily., Disp: , Rfl:    DULoxetine (CYMBALTA) 30 MG capsule, Take 1 capsule (30 mg total) by mouth daily., Disp: 30 capsule, Rfl: 5   etanercept (ENBREL) 50 MG/ML injection, Inject 50 mg into the skin once a week., Disp: , Rfl:    folic acid (FOLVITE) 1 MG tablet, Take 1 mg by mouth daily., Disp: , Rfl:    HYDROcodone-acetaminophen (NORCO/VICODIN) 5-325 MG tablet, Take 1-2 tablets by mouth every 4 (four) hours as needed for moderate pain., Disp: 30 tablet, Rfl: 0   Menthol, Topical Analgesic, (BIOFREEZE ROLL-ON EX), Apply 1 application topically as needed., Disp: , Rfl:    methotrexate (RHEUMATREX) 2.5 MG tablet, Take 2.5 mg by mouth once a week. On Sundays, Disp: , Rfl:    pregabalin (LYRICA) 75 MG capsule, Take 1 capsule (75 mg total) by mouth 2 (two) times daily., Disp: 60 capsule, Rfl: 5   Cholecalciferol (VITAMIN D3) 1.25 MG (50000 UT) CAPS, Take 1 capsule by mouth once a week. (Patient not taking: Reported on 03/03/2021), Disp: 12 capsule, Rfl: 4   Cyanocobalamin (VITAMIN B-12) 2000 MCG TBCR, Take by mouth. (Patient not taking: Reported on 03/03/2021), Disp: , Rfl:    Na Sulfate-K Sulfate-Mg Sulf (SUPREP BOWEL PREP KIT) 17.5-3.13-1.6 GM/177ML SOLN, Take 1 kit by mouth as directed. (Patient not taking: Reported on 03/03/2021), Disp: 354 mL, Rfl: 0    Assessment & Plan:  Fatigue/ feeling ill  Check for FLU  / COVID Check CBC< UA< CMP Increase fluid intake.  Headahce - tyelnol every 4-6 hrs prn and alternate this with ibubrufen 800 mg q 8 hrly. OTC -  Allegra / claritin.   ? Dizziness ? Sec to anemia  Check STAT CBC  Consider Hemeoccult.  Problem List Items Addressed This Visit   None    No orders of the defined types were placed in this encounter.    No orders of the defined types were placed in this encounter.    Follow  up plan: No follow-ups on file.

## 2021-03-03 NOTE — Telephone Encounter (Signed)
Pt. Reports started feeling dizzy 1 week ago. Has headaches occasionally with this. Has to lay down and the dizziness subsides. Pt. Has an appointment today.   Answer Assessment - Initial Assessment Questions 1. DESCRIPTION: "Describe your dizziness."     Dizzy 2. LIGHTHEADED: "Do you feel lightheaded?" (e.g., somewhat faint, woozy, weak upon standing)     Yes 3. VERTIGO: "Do you feel like either you or the room is spinning or tilting?" (i.e. vertigo)     No 4. SEVERITY: "How bad is it?"  "Do you feel like you are going to faint?" "Can you stand and walk?"   - MILD: Feels slightly dizzy, but walking normally.   - MODERATE: Feels unsteady when walking, but not falling; interferes with normal activities (e.g., school, work).   - SEVERE: Unable to walk without falling, or requires assistance to walk without falling; feels like passing out now.      Mild 5. ONSET:  "When did the dizziness begin?"     1 week ago 6. AGGRAVATING FACTORS: "Does anything make it worse?" (e.g., standing, change in head position)     Standing 7. HEART RATE: "Can you tell me your heart rate?" "How many beats in 15 seconds?"  (Note: not all patients can do this)       No 8. CAUSE: "What do you think is causing the dizziness?"     Unsure 9. RECURRENT SYMPTOM: "Have you had dizziness before?" If Yes, ask: "When was the last time?" "What happened that time?"     No 10. OTHER SYMPTOMS: "Do you have any other symptoms?" (e.g., fever, chest pain, vomiting, diarrhea, bleeding)       Decreased energy 11. PREGNANCY: "Is there any chance you are pregnant?" "When was your last menstrual period?"       No  Protocols used: Dizziness - Lightheadedness-A-AH

## 2021-03-03 NOTE — Telephone Encounter (Signed)
Routing to provider as an Pharmacist, hospital. Patient is scheduled to be seen today at 3:20.

## 2021-03-04 ENCOUNTER — Telehealth: Payer: Self-pay

## 2021-03-04 ENCOUNTER — Other Ambulatory Visit: Payer: Self-pay | Admitting: Internal Medicine

## 2021-03-04 ENCOUNTER — Ambulatory Visit: Payer: BC Managed Care – PPO | Admitting: Student in an Organized Health Care Education/Training Program

## 2021-03-04 LAB — COMPREHENSIVE METABOLIC PANEL
ALT: 10 IU/L (ref 0–32)
AST: 11 IU/L (ref 0–40)
Albumin/Globulin Ratio: 1.6 (ref 1.2–2.2)
Albumin: 4 g/dL (ref 3.8–4.8)
Alkaline Phosphatase: 109 IU/L (ref 44–121)
BUN/Creatinine Ratio: 18 (ref 12–28)
BUN: 14 mg/dL (ref 8–27)
Bilirubin Total: <0.2 mg/dL (ref 0.0–1.2)
CO2: 23 mmol/L (ref 20–29)
Calcium: 9.3 mg/dL (ref 8.7–10.3)
Chloride: 103 mmol/L (ref 96–106)
Creatinine, Ser: 0.77 mg/dL (ref 0.57–1.00)
Globulin, Total: 2.5 g/dL (ref 1.5–4.5)
Glucose: 96 mg/dL (ref 65–99)
Potassium: 4.8 mmol/L (ref 3.5–5.2)
Sodium: 141 mmol/L (ref 134–144)
Total Protein: 6.5 g/dL (ref 6.0–8.5)
eGFR: 88 mL/min/{1.73_m2} (ref >59)

## 2021-03-04 LAB — URINALYSIS, ROUTINE W REFLEX MICROSCOPIC
Bilirubin, UA: NEGATIVE
Glucose, UA: NEGATIVE
Nitrite, UA: NEGATIVE
Specific Gravity, UA: >1.03 — ABNORMAL HIGH (ref 1.005–1.030)
Urobilinogen, Ur: 0.2 mg/dL (ref 0.2–1.0)
pH, UA: 5.5 (ref 5.0–7.5)

## 2021-03-04 LAB — MICROSCOPIC EXAMINATION

## 2021-03-04 LAB — CBC
Hematocrit: 35.3 % (ref 34.0–46.6)
Hemoglobin: 10.9 g/dL — ABNORMAL LOW (ref 11.1–15.9)
MCH: 28.3 pg (ref 26.6–33.0)
MCHC: 30.9 g/dL — ABNORMAL LOW (ref 31.5–35.7)
MCV: 92 fL (ref 79–97)
Platelets: 306 10*3/uL (ref 150–450)
RBC: 3.85 x10E6/uL (ref 3.77–5.28)
RDW: 14 % (ref 11.7–15.4)
WBC: 12.7 10*3/uL — ABNORMAL HIGH (ref 3.4–10.8)

## 2021-03-04 LAB — NOVEL CORONAVIRUS, NAA: SARS-CoV-2, NAA: NOT DETECTED

## 2021-03-04 LAB — SARS-COV-2, NAA 2 DAY TAT

## 2021-03-04 MED ORDER — CIPROFLOXACIN HCL 500 MG PO TABS: 500.0000 mg | ORAL_TABLET | Freq: Two times a day (BID) | ORAL | 0 refills | Status: AC

## 2021-03-04 MED ORDER — OMEPRAZOLE 20 MG PO CPDR: 20.0000 mg | DELAYED_RELEASE_CAPSULE | Freq: Every day | ORAL | 3 refills | Status: DC

## 2021-03-04 NOTE — Telephone Encounter (Signed)
`  pt noted to have anemia, HB lower from 12.7 -- 10.9 with dark stools and heatburn per pts verbal record while spekaing on the phone today   Lab Results  Component Value Date   WBC 12.7 (H) 03/03/2021   HGB 10.9 (L) 03/03/2021   HCT 35.3 03/03/2021   MCV 92 03/03/2021   PLT 306 03/03/2021   Pt has a ho RA  Not taking any otc meds. Is on MTX for such. Has had heartburn has had pain in swallowing. AVOID - NSAIDs ,consider Gi refrral  Start omeprazole for such  Has a UTI . Advised to call the office or go to the ER if she develops chest pain , any new onset of bleeding / black stools or  fresh red blood from any orifice,  shortness of breath dizziness or tingling or numbness.   Pt verbalized understanding of such. To fu with pcp x 1 week.   Erin Contreras

## 2021-03-04 NOTE — Telephone Encounter (Signed)
Copied from [place] [id number]. Topic: General - Other >> Mar 04, 2021  9:13 AM Lennox Solders wrote: Reason for CRM: Pt is calling to let dr Neomia Dear know she just took at home covid test and its negative. Please advise   Routing to notify provider of negative home covid test per patient.

## 2021-03-04 NOTE — Telephone Encounter (Signed)
Copied from [place] [id number]. Topic: General - Other >> Mar 04, 2021  9:22 AM [name] wrote: Reason for CRM: Patient daughter Caryl Pina called in asking for a call from Dr Neomia Dear to discuss her moms appointment on 03/03/21 say that mom was not able to explain what she was told regarding her test results  please call Ph# [phone]

## 2021-03-04 NOTE — Progress Notes (Signed)
WBC count - high , Stools darker - hb 10.9 , dizziness + , will start p ton Cipro for UTI Has darker stools ? Bleeding,  COVID and FLU swabs pending, pt willl do a STAT COVID Home test today and let me know in the next hour.  Might need to go to the ER if continues to have dark stools with dizziness. Advised to call the office or go to the ER if she develops chest pain , any new onset of bleeding / black stools or  fresh red blood from any orifice,  shortness of breath dizziness or tingling or numbness.  Pt verbalized understanding of such.   Erin Contreras

## 2021-03-04 NOTE — Progress Notes (Signed)
Please let pt know this was normal.

## 2021-03-04 NOTE — Telephone Encounter (Signed)
Spoke with patient's daughter Caryl Pina and reiterated what Dr. Neomia Dear told patient over the phone earlier today. "Advised to call the office or go to the ER if she develops chest pain , any new onset of bleeding / black stools or  fresh red blood from any orifice,  shortness of breath dizziness or tingling or numbness.  Pt verbalized understanding of such"

## 2021-03-05 LAB — URINE CULTURE

## 2021-03-09 ENCOUNTER — Encounter: Payer: Self-pay | Admitting: Nurse Practitioner

## 2021-03-09 DIAGNOSIS — K449 Diaphragmatic hernia without obstruction or gangrene: Secondary | ICD-10-CM | POA: Insufficient documentation

## 2021-03-09 DIAGNOSIS — D3500 Benign neoplasm of unspecified adrenal gland: Secondary | ICD-10-CM | POA: Insufficient documentation

## 2021-03-11 DIAGNOSIS — M0579 Rheumatoid arthritis with rheumatoid factor of multiple sites without organ or systems involvement: Secondary | ICD-10-CM | POA: Diagnosis not present

## 2021-03-12 DIAGNOSIS — Z96652 Presence of left artificial knee joint: Secondary | ICD-10-CM | POA: Diagnosis not present

## 2021-03-16 ENCOUNTER — Ambulatory Visit: Payer: BC Managed Care – PPO | Admitting: Nurse Practitioner

## 2021-03-16 DIAGNOSIS — M170 Bilateral primary osteoarthritis of knee: Secondary | ICD-10-CM | POA: Diagnosis not present

## 2021-03-16 DIAGNOSIS — Z79899 Other long term (current) drug therapy: Secondary | ICD-10-CM | POA: Diagnosis not present

## 2021-03-16 DIAGNOSIS — M0579 Rheumatoid arthritis with rheumatoid factor of multiple sites without organ or systems involvement: Secondary | ICD-10-CM | POA: Diagnosis not present

## 2021-03-16 DIAGNOSIS — M47816 Spondylosis without myelopathy or radiculopathy, lumbar region: Secondary | ICD-10-CM | POA: Diagnosis not present

## 2021-03-17 ENCOUNTER — Telehealth: Payer: Self-pay | Admitting: Nurse Practitioner

## 2021-03-17 NOTE — Telephone Encounter (Signed)
Rx signed and receipt confirmed by pharmacy on 03/04/21 at 10:21 am. Pharmacy contacted and noted insurance would not cover due to request too early. Insurance will cover today 03/17/21. Patient notified pharmacy is refilling medication today.

## 2021-03-17 NOTE — Telephone Encounter (Signed)
Medication Refill - Medication:  omeprazole (PRILOSEC) 20 MG capsule  Has the patient contacted their pharmacy? No.  Preferred Pharmacy (with phone number or street name):  [id number] [place] [address] Phone:  [phone]:  [phone] Agent: Please be advised that RX refills may take up to 3 business days. We ask that you follow-up with your pharmacy.

## 2021-03-18 ENCOUNTER — Other Ambulatory Visit: Payer: Self-pay

## 2021-03-18 MED ORDER — PEG 3350-KCL-NA BICARB-NACL 420 G PO SOLR: ORAL | 0 refills | Status: DC

## 2021-03-18 MED ORDER — NA SULFATE-K SULFATE-MG SULF 17.5-3.13-1.6 GM/177ML PO SOLN: 354.0000 mL | Freq: Once | ORAL | 0 refills | Status: AC

## 2021-03-18 NOTE — Telephone Encounter (Signed)
Spoke with patient's local pharmacy and was informed that patient never picked up her Bowel Prep prescription as it was not cover by her insurance and she would not be able to afford the cost out of pocket. Spoke with Ginger, CMA from [employer] and informed their office that patient reached out to our office and notified our office that she was unable to fill the prescription for her colonoscopy procedure scheduled for tomorrow. Ginger, CMA informed me that they would send over a cost effectively prescription over for the patient as she could not afford the other prescription as her insurance would not cover it.

## 2021-03-18 NOTE — Telephone Encounter (Signed)
Pt called and stated she is suppose to be taking a medication starting today before her colonoscopy / she is not ure of the name of the liquid but [facility] so [facility] today/if she has to cancel her appt for the colonoscopy she will incur a $100 charge/ please call the pt to advise asap   She is not sure if Omeprazole was the medication she was suppose to take before the colonoscopy

## 2021-03-19 ENCOUNTER — Encounter
Admission: RE | Disposition: A | Discharge status: Home / Self Care | Payer: Self-pay | Source: Home / Self Care | Attending: Gastroenterology

## 2021-03-19 ENCOUNTER — Ambulatory Visit
Admission: RE | Admit: 2021-03-19 | Discharge: 2021-03-19 | Disposition: A | Discharge status: Home / Self Care | Payer: BC Managed Care – PPO | Attending: Gastroenterology | Admitting: Gastroenterology

## 2021-03-19 ENCOUNTER — Encounter: Payer: Self-pay | Admitting: Gastroenterology

## 2021-03-19 ENCOUNTER — Ambulatory Visit: Payer: BC Managed Care – PPO | Admitting: Anesthesiology

## 2021-03-19 ENCOUNTER — Other Ambulatory Visit: Payer: Self-pay

## 2021-03-19 DIAGNOSIS — Z791 Long term (current) use of non-steroidal anti-inflammatories (NSAID): Secondary | ICD-10-CM | POA: Diagnosis not present

## 2021-03-19 DIAGNOSIS — G4733 Obstructive sleep apnea (adult) (pediatric): Secondary | ICD-10-CM | POA: Diagnosis not present

## 2021-03-19 DIAGNOSIS — Z1211 Encounter for screening for malignant neoplasm of colon: Secondary | ICD-10-CM | POA: Diagnosis not present

## 2021-03-19 DIAGNOSIS — Z79899 Other long term (current) drug therapy: Secondary | ICD-10-CM | POA: Insufficient documentation

## 2021-03-19 HISTORY — PX: COLONOSCOPY: SHX5424

## 2021-03-19 SURGERY — COLONOSCOPY
Anesthesia: General

## 2021-03-19 MED ORDER — LIDOCAINE HCL (CARDIAC) PF 100 MG/5ML IV SOSY
PREFILLED_SYRINGE | INTRAVENOUS | Status: DC | PRN
  Administered 2021-03-19: 40 mg via INTRAVENOUS

## 2021-03-19 MED ORDER — SODIUM CHLORIDE 0.9 % IV SOLN: INTRAVENOUS | Status: DC

## 2021-03-19 MED ORDER — PROPOFOL 10 MG/ML IV BOLUS
INTRAVENOUS | Status: DC | PRN
  Administered 2021-03-19: 90 mg via INTRAVENOUS

## 2021-03-19 MED ORDER — ONDANSETRON HCL 4 MG/2ML IJ SOLN
INTRAMUSCULAR | Status: DC | PRN
  Administered 2021-03-19: 4 mg via INTRAVENOUS

## 2021-03-19 MED ORDER — PROPOFOL 500 MG/50ML IV EMUL
INTRAVENOUS | Status: AC
  Filled 2021-03-19: qty 50

## 2021-03-19 MED ORDER — PROPOFOL 500 MG/50ML IV EMUL
INTRAVENOUS | Status: DC | PRN
  Administered 2021-03-19: 150 ug/kg/min via INTRAVENOUS

## 2021-03-19 NOTE — Anesthesia Postprocedure Evaluation (Signed)
Anesthesia Post Note  Patient: Erin Contreras  Procedure(s) Performed: COLONOSCOPY  Patient location during evaluation: Phase II Anesthesia Type: General Level of consciousness: awake and alert, awake and oriented Pain management: pain level controlled Vital Signs Assessment: post-procedure vital signs reviewed and stable Respiratory status: spontaneous breathing, nonlabored ventilation and respiratory function stable Cardiovascular status: blood pressure returned to baseline and stable Postop Assessment: no apparent nausea or vomiting Anesthetic complications: no   No notable events documented.   Last Vitals:  Vitals:   03/19/21 0819 03/19/21 0840  BP: (!) 105/59   Pulse:    Resp:  18  Temp:    SpO2:      Last Pain:  Vitals:   03/19/21 0841  TempSrc:   PainSc: 0-No pain                 Phill Mutter

## 2021-03-19 NOTE — Anesthesia Preprocedure Evaluation (Signed)
Anesthesia Evaluation  Patient identified by MRN, date of birth, ID band Patient awake    Reviewed: Allergy & Precautions, H&P , NPO status , Patient's Chart, lab work & pertinent test results, reviewed documented beta blocker date and time   History of Anesthesia Complications (+) PONV and history of anesthetic complications  Airway Mallampati: II   Neck ROM: full    Dental  (+) Poor Dentition, Chipped,    Pulmonary sleep apnea ,    Pulmonary exam normal        Cardiovascular Exercise Tolerance: Poor hypertension, Pt. on medications Normal cardiovascular exam Rhythm:regular Rate:Normal     Neuro/Psych PSYCHIATRIC DISORDERS Depression  Neuromuscular disease    GI/Hepatic Neg liver ROS, GERD  Medicated,  Endo/Other  Morbid obesity  Renal/GU negative Renal ROS  negative genitourinary   Musculoskeletal  (+) Arthritis ,   Abdominal   Peds  Hematology negative hematology ROS (+)   Anesthesia Other Findings Boils    Ear mass    GERD (gastroesophageal reflux disease) Hemorrhoids    Herpes simplex virus infection HSV infection    Lumbago with sciatica   OA (osteoarthritis) of knee   Obesity    Overactive bladder    PONV (postoperative nausea and vomiting)    Pre-diabetes    RA (rheumatoid arthritis) ([facility])  Sleep apnea  does not use cpap-had bariatric surgery and has no issues since surgery  Urge and stress incontinence       Reproductive/Obstetrics negative OB ROS                             Anesthesia Physical  Anesthesia Plan  ASA: 3  Anesthesia Plan: General   Post-op Pain Management:    Induction: Intravenous  PONV Risk Score and Plan: 2 and TIVA and Propofol infusion  Airway Management Planned: Natural Airway and Nasal Cannula  Additional Equipment:   Intra-op Plan:   Post-operative Plan:   Informed Consent: I have reviewed the patients History and Physical,  chart, labs and discussed the procedure including the risks, benefits and alternatives for the proposed anesthesia with the patient or authorized representative who has indicated his/her understanding and acceptance.     Dental Advisory Given  Plan Discussed with: CRNA, Anesthesiologist and Surgeon  Anesthesia Plan Comments:         Anesthesia Quick Evaluation

## 2021-03-19 NOTE — Anesthesia Procedure Notes (Signed)
Date/Time: 03/19/2021 7:44 AM Performed by: Doreen Salvage, CRNA Pre-anesthesia Checklist: Patient identified, Emergency Drugs available, Suction available and Patient being monitored Patient Re-evaluated:Patient Re-evaluated prior to induction Oxygen Delivery Method: Nasal cannula Induction Type: IV induction Dental Injury: Teeth and Oropharynx as per pre-operative assessment  Comments: Nasal cannula with etCO2 monitoring

## 2021-03-19 NOTE — Op Note (Addendum)
[facility] Patient Name: Erin Contreras Procedure Date: 03/19/2021 7:18 AM MRN: [id number] Account #: [id number] Date of Birth: [birth year] Admit Type: Outpatient Age: 60 Room: [facility] ENDO ROOM 3 Gender: Female Note Status: Finalized Procedure:             Colonoscopy Indications:           Screening for colorectal malignant neoplasm Providers:             Jonathon Bellows MD, MD Referring MD:          Barbaraann Faster. Ned Card (Referring MD) Medicines:             Monitored Anesthesia Care Complications:         No immediate complications. Procedure:             Pre-Anesthesia Assessment:                        - Prior to the procedure, a History and Physical was                         performed, and patient medications, allergies and                         sensitivities were reviewed. The patient's tolerance                         of previous anesthesia was reviewed.                        - The risks and benefits of the procedure and the                         sedation options and risks were discussed with the                         patient. All questions were answered and informed                         consent was obtained.                        - ASA Grade Assessment: II - A patient with mild                         systemic disease.                        After obtaining informed consent, the colonoscope was                         passed under direct vision. Throughout the procedure,                         the patient's blood pressure, pulse, and oxygen                         saturations were monitored continuously. The                         Colonoscope was introduced through the anus and  advanced to the the cecum, identified by the                         appendiceal orifice. The colonoscopy was performed                         without difficulty. The patient tolerated the                         procedure well. The  quality of the bowel preparation                         was excellent. Findings:      The perianal and digital rectal examinations were normal.      The entire examined colon appeared normal on direct and retroflexion       views. Impression:            - The entire examined colon is normal on direct and                         retroflexion views.                        - No specimens collected. Recommendation:        - Discharge patient to home (with escort).                        - Resume previous diet.                        - Continue present medications.                        - Repeat colonoscopy in 10 years for screening                         purposes. Procedure Code(s):     --- Professional ---                        [phone], Colonoscopy, flexible; diagnostic, including                         collection of specimen(s) by brushing or washing, when                         performed (separate procedure) Diagnosis Code(s):     --- Professional ---                        Z12.11, Encounter for screening for malignant neoplasm                         of colon CPT copyright 2019 American Medical Association. All rights reserved. The codes documented in this report are preliminary and upon coder review may  be revised to meet current compliance requirements. Jonathon Bellows, MD Jonathon Bellows MD, MD 03/19/2021 8:01:17 AM This report has been signed electronically. Number of Addenda: 0 Note Initiated On: 03/19/2021 7:18 AM Scope Withdrawal Time: 0 hours 8 minutes 28 seconds  Total Procedure Duration: 0 hours 10 minutes 56 seconds  Estimated Blood Loss:  Estimated blood loss: none.      [facility]

## 2021-03-19 NOTE — H&P (Signed)
Jonathon Bellows, MD [address], [place], [place], [place], [address] [address], [place], [place], [place], [address] Phone: [phone]  Fax: [phone]  Primary Care Physician:  Venita Lick, NP   Pre-Procedure History & Physical: HPI:  Erin Contreras is a 60 y.o. female is here for an colonoscopy.   Past Medical History:  Diagnosis Date   Boils    Ear mass    GERD (gastroesophageal reflux disease)    Hemorrhoids    Herpes simplex virus infection    HSV infection    Lumbago with sciatica    OA (osteoarthritis) of knee    Obesity    Overactive bladder    PONV (postoperative nausea and vomiting)    Pre-diabetes    RA (rheumatoid arthritis) ([place])    Sleep apnea    does not use cpap-had bariatric surgery and has no issues since surgery   Urge and stress incontinence     Past Surgical History:  Procedure Laterality Date   CHOLECYSTECTOMY  2011   FRACTURE SURGERY Right    ankle as a child   JOINT REPLACEMENT Left 01/28/2021   KNEE ARTHROPLASTY Right 07/11/2017   Procedure: COMPUTER ASSISTED TOTAL KNEE ARTHROPLASTY;  Surgeon: Dereck Leep, MD;  Location: [facility] ORS;  Service: Orthopedics;  Laterality: Right;   KNEE ARTHROPLASTY Left 01/28/2021   Procedure: COMPUTER ASSISTED TOTAL KNEE ARTHROPLASTY;  Surgeon: Dereck Leep, MD;  Location: [facility] ORS;  Service: Orthopedics;  Laterality: Left;   SLEEVE GASTROPLASTY     TUMOR REMOVAL Right 2010   ear    Prior to Admission medications   Medication Sig Start Date End Date Taking? Authorizing Provider  abatacept (ORENCIA) 250 MG injection Inject into the vein every 30 (thirty) days.   Yes [provider]  albuterol (VENTOLIN HFA) 108 (90 Base) MCG/ACT inhaler Inhale 2 puffs into the lungs every 6 (six) hours as needed for wheezing or shortness of breath. 09/13/19  Yes Cannady, Jolene T, NP  celecoxib (CELEBREX) 200 MG capsule Take 200 mg by mouth 2 (two) times daily.   Yes [provider]   folic acid (FOLVITE) 1 MG tablet Take 1 mg by mouth daily. 11/22/19  Yes [provider]  methotrexate (RHEUMATREX) 2.5 MG tablet Take 2.5 mg by mouth once a week. On Sundays 04/24/20  Yes [provider]  omeprazole (PRILOSEC) 20 MG capsule Take 1 capsule (20 mg total) by mouth daily. 03/04/21  Yes Vigg, Avanti, MD  pregabalin (LYRICA) 75 MG capsule Take 1 capsule (75 mg total) by mouth 2 (two) times daily. 02/19/21 08/18/21 Yes Gillis Santa, MD  acyclovir (ZOVIRAX) 400 MG tablet TAKE 1 TABLET BY MOUTH TWICE A DAY AS NEEDED 11/12/20   Cannady, Henrine Screws T, NP  Cholecalciferol (VITAMIN D3) 1.25 MG (50000 UT) CAPS Take 1 capsule by mouth once a week. Patient not taking: Reported on 03/03/2021 06/12/20   Marnee Guarneri T, NP  Cyanocobalamin (VITAMIN B-12) 2000 MCG TBCR Take by mouth. Patient not taking: Reported on 03/03/2021    [provider]  diclofenac Sodium (VOLTAREN) 1 % GEL Apply topically 4 (four) times daily.    [provider]  DULoxetine (CYMBALTA) 30 MG capsule Take 1 capsule (30 mg total) by mouth daily. 02/19/21 08/18/21  Gillis Santa, MD  etanercept (ENBREL) 50 MG/ML injection Inject 50 mg into the skin once a week. Patient not taking: Reported on 03/19/2021 01/29/20   [provider]  HYDROcodone-acetaminophen (NORCO/VICODIN) 5-325 MG  tablet Take 1-2 tablets by mouth every 4 (four) hours as needed for moderate pain. Patient not taking: Reported on 03/19/2021 01/30/21   Tamala Julian B, PA-C  Menthol, Topical Analgesic, (BIOFREEZE ROLL-ON EX) Apply 1 application topically as needed.    [provider]  polyethylene glycol-electrolytes (GAVILYTE-N WITH FLAVOR PACK) 420 g solution Drink one 8 oz glass every 20 mins until entire container is finished starting at 5:00pm today. 03/18/21   Jonathon Bellows, MD    Allergies as of 02/25/2021   (No Known Allergies)    Family History  Problem Relation Age of Onset   Cancer Mother        liver    Diabetes Mother    Hyperlipidemia Mother    Hypertension Mother    Cirrhosis Father        liver   Hypertension Daughter    Stroke Maternal Grandmother    Stroke Maternal Grandfather    Heart disease Paternal Grandmother    Diabetes Paternal Grandmother    Heart disease Paternal Grandfather    Diabetes Paternal Grandfather     Social History   Socioeconomic History   Marital status: Single    Spouse name: Not on file   Number of children: Not on file   Years of education: Not on file   Highest education level: Not on file  Occupational History   Not on file  Tobacco Use   Smoking status: Never   Smokeless tobacco: Never  Vaping Use   Vaping Use: Never used  Substance and Sexual Activity   Alcohol use: No   Drug use: No   Sexual activity: Not on file  Other Topics Concern   Not on file  Social History Narrative   Not on file   Social Determinants of Health   Financial Resource Strain: Not on file  Food Insecurity: Not on file  Transportation Needs: Not on file  Physical Activity: Not on file  Stress: Not on file  Social Connections: Not on file  Intimate Partner Violence: Not on file    Review of Systems: See HPI, otherwise negative ROS  Physical Exam: BP 130/85   Pulse 75   Temp (!) 97.3 F (36.3 C) (Temporal)   Resp 20   Ht '5\' 9"'$  (1.753 m)   Wt 127 kg   SpO2 98%   BMI 41.35 kg/m  General:   Alert,  pleasant and cooperative in NAD Head:  Normocephalic and atraumatic. Neck:  Supple; no masses or thyromegaly. Lungs:  Clear throughout to auscultation, normal respiratory effort.    Heart:  +S1, +S2, Regular rate and rhythm, No edema. Abdomen:  Soft, nontender and nondistended. Normal bowel sounds, without guarding, and without rebound.   Neurologic:  Alert and  oriented x4;  grossly normal neurologically.  Impression/Plan: Erin Contreras is here for an colonoscopy to be performed for Screening colonoscopy average risk   Risks, benefits,  limitations, and alternatives regarding  colonoscopy have been reviewed with the patient.  Questions have been answered.  All parties agreeable.   Jonathon Bellows, MD  03/19/2021, 7:44 AM

## 2021-03-19 NOTE — Transfer of Care (Signed)
Immediate Anesthesia Transfer of Care Note  Patient: Erin Contreras  Procedure(s) Performed: Procedure(s): COLONOSCOPY (N/A)  Patient Location: PACU and Endoscopy Unit  Anesthesia Type:General  Level of Consciousness: sedated  Airway & Oxygen Therapy: Patient Spontanous Breathing and Patient connected to nasal cannula oxygen  Post-op Assessment: Report given to RN and Post -op Vital signs reviewed and stable  Post vital signs: Reviewed and stable  Last Vitals:  Vitals:   03/19/21 0718  BP: 130/85  Pulse: 75  Resp: 20  Temp: (!) 36.3 C  SpO2: [id number]    Complications: No apparent anesthesia complications

## 2021-03-20 ENCOUNTER — Encounter: Payer: Self-pay | Admitting: Gastroenterology

## 2021-03-26 ENCOUNTER — Other Ambulatory Visit: Payer: Self-pay

## 2021-03-26 ENCOUNTER — Ambulatory Visit
Payer: BC Managed Care – PPO | Attending: Student in an Organized Health Care Education/Training Program | Admitting: Student in an Organized Health Care Education/Training Program

## 2021-03-26 DIAGNOSIS — M5416 Radiculopathy, lumbar region: Secondary | ICD-10-CM | POA: Diagnosis not present

## 2021-03-26 DIAGNOSIS — G894 Chronic pain syndrome: Secondary | ICD-10-CM

## 2021-03-26 DIAGNOSIS — G8929 Other chronic pain: Secondary | ICD-10-CM

## 2021-03-26 NOTE — Progress Notes (Signed)
Patient: Erin Contreras  Service Category: E/M  Provider: Gillis Santa, MD  DOB: 29-Jul-1961  DOS: 03/26/2021  Location: Office  MRN: [id number]  Setting: Ambulatory outpatient  Referring Provider: Venita Lick, NP  Type: Established Patient  Specialty: Interventional Pain Management  PCP: Venita Lick, NP  Location: Remote location  Delivery: TeleHealth     Virtual Encounter - Pain Management PROVIDER NOTE: Information contained herein reflects review and annotations entered in association with encounter. Interpretation of such information and data should be left to medically-trained personnel. Information provided to patient can be located elsewhere in the medical record under "Patient Instructions". Document created using STT-dictation technology, any transcriptional errors that may result from process are unintentional.    Contact & Pharmacy Preferred: [phone] Home: [phone] (home) Mobile: [phone] (mobile) E-mail: No e-mail address on record  [id number] [place] [place] - [address] [place] [address] Phone: [phone] Fax: [phone]   Pre-screening  Erin Contreras offered "in-person" vs "virtual" encounter. She indicated preferring virtual for this encounter.   Reason COVID-19*  Social distancing based on CDC and AMA recommendations.   I contacted Erin Contreras on 03/26/2021 via video conference.      I clearly identified myself as Gillis Santa, MD. I verified that I was speaking with the correct person using two identifiers (Name: Erin Contreras, and date of birth: [birth year]).  Consent I sought verbal advanced consent from Erin Contreras for virtual visit interactions. I informed Erin Contreras of possible security and privacy concerns, risks, and limitations associated with providing "not-in-person" medical evaluation and management services. I also informed Erin Contreras of the availability of "in-person" appointments. Finally, I  informed her that there would be a charge for the virtual visit and that she could be  personally, fully or partially, financially responsible for it. Erin Contreras expressed understanding and agreed to proceed.   Historic Elements   Erin Contreras is a 60 y.o. year old, female patient evaluated today after our last contact on 03/02/2021. Erin Contreras  has a past medical history of Boils, Ear mass, GERD (gastroesophageal reflux disease), Hemorrhoids, Herpes simplex virus infection, HSV infection, Lumbago with sciatica, OA (osteoarthritis) of knee, Obesity, Overactive bladder, PONV (postoperative nausea and vomiting), Pre-diabetes, RA (rheumatoid arthritis) ([facility]), Sleep apnea, and Urge and stress incontinence. She also  has a past surgical history that includes Cholecystectomy (2011); Tumor removal (Right, 2010); Sleeve Gastroplasty; Fracture surgery (Right); Knee Arthroplasty (Right, 07/11/2017); Knee Arthroplasty (Left, 01/28/2021); Joint replacement (Left, 01/28/2021); and Colonoscopy (N/A, 03/19/2021). Erin Contreras has a current medication list which includes the following prescription(s): orencia, acyclovir, albuterol, celecoxib, diclofenac sodium, duloxetine, folic acid, menthol (topical analgesic), methotrexate, omeprazole, pregabalin, vitamin d3, vitamin b-12, etanercept, hydrocodone-acetaminophen, and polyethylene glycol-electrolytes. She  reports that she has never smoked. She has never used smokeless tobacco. She reports that she does not drink alcohol and does not use drugs. Erin Contreras has No Known Allergies.   HPI  Today, she is being contacted for a post-procedure assessment.   Post-Procedure Evaluation  Procedure (03/02/2021):   Type: Diagnostic Inter-Laminar Epidural Steroid Injection  #2  Region: Lumbar Level: L5-S1 Level. Laterality: Right   Anxiolysis: Please see nurses note.  Effectiveness during initial hour after procedure (Ultra-Short Term Relief): 100 %   Local anesthetic used:  Long-acting (4-6 hours) Effectiveness: Defined as any analgesic benefit obtained secondary to the administration of local anesthetics. This carries significant diagnostic value as to the etiological location,  or anatomical origin, of the pain. Duration of benefit is expected to coincide with the duration of the local anesthetic used.  Effectiveness during initial 4-6 hours after procedure (Short-Term Relief): 100 %   Long-term benefit: Defined as any relief past the pharmacologic duration of the local anesthetics.  Effectiveness past the initial 6 hours after procedure (Long-Term Relief): 100 % (pain relief lasted approx 1 week and then the pain began to gradually return back to the same severity as before procedure.)   Benefits, current: Defined as benefit present at the time of this evaluation.   Analgesia:  30-35%, easier to walk   Will be returning to work in a couple of weeks so is looking forward to seeing how her pain responds to her being on her feet for work.   Laboratory Chemistry Profile   Renal Lab Results  Component Value Date   BUN 14 03/03/2021   CREATININE 0.77 03/03/2021   BCR 18 03/03/2021   GFRAA 102 06/12/2020   GFRNONAA >60 01/21/2021    Hepatic Lab Results  Component Value Date   AST 11 03/03/2021   ALT 10 03/03/2021   ALBUMIN 4.0 03/03/2021   ALKPHOS 109 03/03/2021   LIPASE 106 05/10/2014    Electrolytes Lab Results  Component Value Date   NA 141 03/03/2021   K 4.8 03/03/2021   CL 103 03/03/2021   CALCIUM 9.3 03/03/2021   MG 2.0 09/04/2014   PHOS 3.7 09/04/2014    Bone Lab Results  Component Value Date   VD25OH 24.3 (L) 10/17/2020    Inflammation (CRP: Acute Phase) (ESR: Chronic Phase) Lab Results  Component Value Date   CRP 0.6 01/21/2021   ESRSEDRATE 43 (H) 01/21/2021         Note: Above Lab results reviewed.   Assessment  The primary encounter diagnosis was Lumbar radiculopathy (right). Diagnoses of Chronic radicular lumbar pain  (right) and Chronic pain syndrome were also pertinent to this visit.  Plan of Care   Follow up PRN for L-ESI   Follow-up plan:   Return if symptoms worsen or fail to improve.     Right L5-S1 ESI 05/28/2020, 03/02/21    Recent Visits Date Type Provider Dept  03/02/21 Procedure visit Gillis Santa, MD [facility]  02/19/21 Office Visit Gillis Santa, MD [facility]  Showing recent visits within past 90 days and meeting all other requirements Today's Visits Date Type Provider Dept  03/26/21 Telemedicine Gillis Santa, MD [facility]  Showing today's visits and meeting all other requirements Future Appointments No visits were found meeting these conditions. Showing future appointments within next 90 days and meeting all other requirements I discussed the assessment and treatment plan with the patient. The patient was provided an opportunity to ask questions and all were answered. The patient agreed with the plan and demonstrated an understanding of the instructions.  Patient advised to call back or seek an in-person evaluation if the symptoms or condition worsens.  Duration of encounter:70minutes.  Note by: Gillis Santa, MD Date: 03/26/2021; Time: 11:01 AM

## 2021-03-27 ENCOUNTER — Telehealth: Payer: Self-pay

## 2021-04-01 ENCOUNTER — Telehealth: Payer: BC Managed Care – PPO | Admitting: Student in an Organized Health Care Education/Training Program

## 2021-04-03 ENCOUNTER — Ambulatory Visit: Payer: BC Managed Care – PPO | Admitting: Nurse Practitioner

## 2021-04-03 DIAGNOSIS — Z471 Aftercare following joint replacement surgery: Secondary | ICD-10-CM | POA: Diagnosis not present

## 2021-04-15 ENCOUNTER — Encounter: Payer: Self-pay | Admitting: Student in an Organized Health Care Education/Training Program

## 2021-04-16 ENCOUNTER — Other Ambulatory Visit: Payer: Self-pay

## 2021-04-16 ENCOUNTER — Encounter: Payer: Self-pay | Admitting: Student in an Organized Health Care Education/Training Program

## 2021-04-16 ENCOUNTER — Ambulatory Visit
Payer: BC Managed Care – PPO | Attending: Student in an Organized Health Care Education/Training Program | Admitting: Student in an Organized Health Care Education/Training Program

## 2021-04-16 DIAGNOSIS — G894 Chronic pain syndrome: Secondary | ICD-10-CM | POA: Diagnosis not present

## 2021-04-16 DIAGNOSIS — M5416 Radiculopathy, lumbar region: Secondary | ICD-10-CM

## 2021-04-16 DIAGNOSIS — M792 Neuralgia and neuritis, unspecified: Secondary | ICD-10-CM

## 2021-04-16 DIAGNOSIS — G8929 Other chronic pain: Secondary | ICD-10-CM | POA: Diagnosis not present

## 2021-04-16 MED ORDER — PREGABALIN 100 MG PO CAPS: 100.0000 mg | ORAL_CAPSULE | Freq: Two times a day (BID) | ORAL | 2 refills | Status: DC

## 2021-04-16 NOTE — Progress Notes (Signed)
Patient: Erin Contreras  Service Category: E/M  Provider: Edward Jolly, MD  DOB: 1960/12/07  DOS: 04/16/2021  Location: Office  MRN: [id number]  Setting: Ambulatory outpatient  Referring Provider: Marjie Skiff, NP  Type: Established Patient  Specialty: Interventional Pain Management  PCP: Marjie Skiff, NP  Location: Home  Delivery: TeleHealth     Virtual Encounter - Pain Management PROVIDER NOTE: Information contained herein reflects review and annotations entered in association with encounter. Interpretation of such information and data should be left to medically-trained personnel. Information provided to patient can be located elsewhere in the medical record under "Patient Instructions". Document created using STT-dictation technology, any transcriptional errors that may result from process are unintentional.    Contact & Pharmacy Preferred: [phone] Home: [phone] (home) Mobile: [phone] (mobile) E-mail: No e-mail address on record  [id number] [place], [place] - [address] [place] [address] Phone: [phone] Fax: [phone]   Pre-screening  Erin Contreras offered "in-person" vs "virtual" encounter. She indicated preferring virtual for this encounter.   Reason COVID-19*  Social distancing based on CDC and AMA recommendations.   I contacted Erin Contreras on 04/16/2021 via telephone.      I clearly identified myself as Edward Jolly, MD. I verified that I was speaking with the correct person using two identifiers (Name: Erin Contreras, and date of birth: 11-18-1960).  Consent I sought verbal advanced consent from Erin Contreras for virtual visit interactions. I informed Erin Contreras of possible security and privacy concerns, risks, and limitations associated with providing "not-in-person" medical evaluation and management services. I also informed Erin Contreras of the availability of "in-person" appointments. Finally, I informed her that  there would be a charge for the virtual visit and that she could be  personally, fully or partially, financially responsible for it. Erin Contreras expressed understanding and agreed to proceed.   Historic Elements   Erin Contreras is a 60 y.o. year old, female patient evaluated today after our last contact on 03/02/2021. Erin Contreras  has a past medical history of Boils, Ear mass, GERD (gastroesophageal reflux disease), Hemorrhoids, Herpes simplex virus infection, HSV infection, Lumbago with sciatica, OA (osteoarthritis) of knee, Obesity, Overactive bladder, PONV (postoperative nausea and vomiting), Pre-diabetes, RA (rheumatoid arthritis) (HCC), Sleep apnea, and Urge and stress incontinence. She also  has a past surgical history that includes Cholecystectomy (2011); Tumor removal (Right, 2010); Sleeve Gastroplasty; Fracture surgery (Right); Knee Arthroplasty (Right, 07/11/2017); Knee Arthroplasty (Left, 01/28/2021); Joint replacement (Left, 01/28/2021); and Colonoscopy (N/A, 03/19/2021). Erin Contreras has a current medication list which includes the following prescription(s): orencia, acyclovir, albuterol, celecoxib, diclofenac sodium, duloxetine, folic acid, menthol (topical analgesic), methotrexate, omeprazole, and pregabalin. She  reports that she has never smoked. She has never used smokeless tobacco. She reports that she does not drink alcohol and does not use drugs. Erin Contreras has No Known Allergies.   HPI  Today, she is being contacted for medication management.  Patient is endorsing increased low back and bilateral leg pain.  She states that she started work approximately 2 weeks ago and has to stand on her feet for approximately 12 hours.  She states that she is having a difficult time managing her pain and functioning.  I encouraged her to look into a lumbar brace, knee brace, have comfortable shoes and talk with her work about limitations and allowing her to rest during her shift.  She states that this  will be difficult.  Encouraged her to see if she could get retirement early.  Otherwise options include increase in Lyrica to 100 mg twice a day.  Patient would like to try that.  I also offered a repeat lumbar epidural steroid injection.  Patient states that she will think about this further and will give [place] a call if she would like to pursue this.  Laboratory Chemistry Profile   Renal Lab Results  Component Value Date   BUN 14 03/03/2021   CREATININE 0.77 03/03/2021   BCR 18 03/03/2021   GFRAA 102 06/12/2020   GFRNONAA >60 01/21/2021    Hepatic Lab Results  Component Value Date   AST 11 03/03/2021   ALT 10 03/03/2021   ALBUMIN 4.0 03/03/2021   ALKPHOS 109 03/03/2021   LIPASE 106 05/10/2014    Electrolytes Lab Results  Component Value Date   NA 141 03/03/2021   K 4.8 03/03/2021   CL 103 03/03/2021   CALCIUM 9.3 03/03/2021   MG 2.0 09/04/2014   PHOS 3.7 09/04/2014    Bone Lab Results  Component Value Date   VD25OH 24.3 (L) 10/17/2020    Inflammation (CRP: Acute Phase) (ESR: Chronic Phase) Lab Results  Component Value Date   CRP 0.6 01/21/2021   ESRSEDRATE 43 (H) 01/21/2021         Note: Above Lab results reviewed.  Assessment  The primary encounter diagnosis was Lumbar radiculopathy (right). Diagnoses of Chronic radicular lumbar pain (right), Chronic pain syndrome, and Neurogenic pain were also pertinent to this visit.  Plan of Care    Erin Contreras has a current medication list which includes the following long-term medication(s): orencia, albuterol, duloxetine, omeprazole, and pregabalin.  Pharmacotherapy (Medications Ordered): Meds ordered this encounter  Medications   pregabalin (LYRICA) 100 MG capsule    Sig: Take 1 capsule (100 mg total) by mouth 2 (two) times daily.    Dispense:  60 capsule    Refill:  2    Fill one day early if pharmacy is closed on scheduled refill date. May substitute for generic if available.   As needed lumbar epidural  steroid injection. Continue Cymbalta as prescribed, consider dose increase to 40 mg   Follow-up plan:   Return if symptoms worsen or fail to improve.     Right L5-S1 ESI 05/28/2020, 03/02/21     Recent Visits Date Type Provider Dept  03/26/21 Telemedicine Gillis Santa, MD [facility]  03/02/21 Procedure visit Gillis Santa, MD [facility]  02/19/21 Office Visit Gillis Santa, MD [facility]  Showing recent visits within past 90 days and meeting all other requirements Today's Visits Date Type Provider Dept  04/16/21 Office Visit Gillis Santa, MD [facility]  Showing today's visits and meeting all other requirements Future Appointments No visits were found meeting these conditions. Showing future appointments within next 90 days and meeting all other requirements I discussed the assessment and treatment plan with the patient. The patient was provided an opportunity to ask questions and all were answered. The patient agreed with the plan and demonstrated an understanding of the instructions.  Patient advised to call back or seek an in-person evaluation if the symptoms or condition worsens.  Duration of encounter: 35minutes.  Note by: Gillis Santa, MD Date: 04/16/2021; Time: 9:48 AM

## 2021-04-21 ENCOUNTER — Telehealth: Payer: Self-pay | Admitting: Student in an Organized Health Care Education/Training Program

## 2021-04-21 NOTE — Telephone Encounter (Signed)
Lyrica prescribed 04-16-21

## 2021-04-29 ENCOUNTER — Telehealth: Payer: Self-pay

## 2021-04-29 NOTE — Telephone Encounter (Signed)
Meds are not working. And in a lot of pain. Nothing is helping.

## 2021-04-29 NOTE — Telephone Encounter (Signed)
Patient needs to make an appt for increased pain, and meds not working.

## 2021-05-05 DIAGNOSIS — M5416 Radiculopathy, lumbar region: Secondary | ICD-10-CM | POA: Diagnosis not present

## 2021-05-05 DIAGNOSIS — Z96652 Presence of left artificial knee joint: Secondary | ICD-10-CM | POA: Diagnosis not present

## 2021-05-05 DIAGNOSIS — M545 Low back pain, unspecified: Secondary | ICD-10-CM | POA: Diagnosis not present

## 2021-05-05 DIAGNOSIS — G8929 Other chronic pain: Secondary | ICD-10-CM | POA: Diagnosis not present

## 2021-05-06 DIAGNOSIS — Z23 Encounter for immunization: Secondary | ICD-10-CM | POA: Diagnosis not present

## 2021-05-13 DIAGNOSIS — M0579 Rheumatoid arthritis with rheumatoid factor of multiple sites without organ or systems involvement: Secondary | ICD-10-CM | POA: Diagnosis not present

## 2021-05-13 NOTE — Progress Notes (Signed)
Acute Office Visit  Subjective:    Patient ID: Erin Contreras, female    DOB: 1960/09/26, 60 y.o.   MRN: [id number]  Chief Complaint  Patient presents with   Ear Fullness    Right ear with decreased hearing    HPI Patient is in today for ear fullness.   EAR CLOGGED  Duration: months Involved ear(s): yes "right Sensation of feeling clogged/plugged: yes Decreased/muffled hearing:yes Ear pain: no Fever: no Otorrhea: no Hearing loss: yes Upper respiratory infection symptoms: yes - cough after getting flu shot 1 week ago Using Q-Tips: no Status: fluctuating History of cerumenosis: yes Treatments attempted: none   Past Medical History:  Diagnosis Date   Boils    Ear mass    GERD (gastroesophageal reflux disease)    Hemorrhoids    Herpes simplex virus infection    HSV infection    Lumbago with sciatica    OA (osteoarthritis) of knee    Obesity    Overactive bladder    PONV (postoperative nausea and vomiting)    Pre-diabetes    RA (rheumatoid arthritis) (HCC)    Sleep apnea    does not use cpap-had bariatric surgery and has no issues since surgery   Urge and stress incontinence     Past Surgical History:  Procedure Laterality Date   CHOLECYSTECTOMY  2011   COLONOSCOPY N/A 03/19/2021   Procedure: COLONOSCOPY;  Surgeon: Wyline Mood, MD;  Location: [facility] ENDOSCOPY;  Service: Gastroenterology;  Laterality: N/A;   FRACTURE SURGERY Right    ankle as a child   JOINT REPLACEMENT Left 01/28/2021   KNEE ARTHROPLASTY Right 07/11/2017   Procedure: COMPUTER ASSISTED TOTAL KNEE ARTHROPLASTY;  Surgeon: Donato Heinz, MD;  Location: [facility] ORS;  Service: Orthopedics;  Laterality: Right;   KNEE ARTHROPLASTY Left 01/28/2021   Procedure: COMPUTER ASSISTED TOTAL KNEE ARTHROPLASTY;  Surgeon: Donato Heinz, MD;  Location: [facility] ORS;  Service: Orthopedics;  Laterality: Left;   SLEEVE GASTROPLASTY     TUMOR REMOVAL Right 2010   ear    Family History  Problem Relation Age of  Onset   Cancer Mother        liver   Diabetes Mother    Hyperlipidemia Mother    Hypertension Mother    Cirrhosis Father        liver   Hypertension Daughter    Stroke Maternal Grandmother    Stroke Maternal Grandfather    Heart disease Paternal Grandmother    Diabetes Paternal Grandmother    Heart disease Paternal Grandfather    Diabetes Paternal Grandfather     Social History   Socioeconomic History   Marital status: Single    Spouse name: Not on file   Number of children: Not on file   Years of education: Not on file   Highest education level: Not on file  Occupational History   Not on file  Tobacco Use   Smoking status: Never   Smokeless tobacco: Never  Vaping Use   Vaping Use: Never used  Substance and Sexual Activity   Alcohol use: No   Drug use: No   Sexual activity: Not on file  Other Topics Concern   Not on file  Social History Narrative   Not on file   Social Determinants of Health   Financial Resource Strain: Not on file  Food Insecurity: Not on file  Transportation Needs: Not on file  Physical Activity: Not on file  Stress: Not on file  Social Connections: Not  on file  Intimate Partner Violence: Not on file    Outpatient Medications Prior to Visit  Medication Sig Dispense Refill   abatacept (ORENCIA) 250 MG injection Inject into the vein every 30 (thirty) days.     acyclovir (ZOVIRAX) 400 MG tablet TAKE 1 TABLET BY MOUTH TWICE A DAY AS NEEDED 180 tablet 0   albuterol (VENTOLIN HFA) 108 (90 Base) MCG/ACT inhaler Inhale 2 puffs into the lungs every 6 (six) hours as needed for wheezing or shortness of breath. 18 g 1   celecoxib (CELEBREX) 200 MG capsule Take 200 mg by mouth 2 (two) times daily.     diclofenac Sodium (VOLTAREN) 1 % GEL Apply topically 4 (four) times daily.     DULoxetine (CYMBALTA) 30 MG capsule Take 1 capsule (30 mg total) by mouth daily. 30 capsule 5   Menthol, Topical Analgesic, (BIOFREEZE ROLL-ON EX) Apply 1 application  topically as needed.     methotrexate (RHEUMATREX) 2.5 MG tablet Take 2.5 mg by mouth once a week. On Sundays     omeprazole (PRILOSEC) 20 MG capsule Take 1 capsule (20 mg total) by mouth daily. 30 capsule 3   pregabalin (LYRICA) 100 MG capsule Take 1 capsule (100 mg total) by mouth 2 (two) times daily. 60 capsule 2   folic acid (FOLVITE) 1 MG tablet Take 1 mg by mouth daily. (Patient not taking: Reported on 05/14/2021)     No facility-administered medications prior to visit.    No Known Allergies  Review of Systems  Constitutional:  Positive for fatigue.  HENT:  Positive for ear pain (fullness and decreased hearing). Negative for sinus pressure and sore throat.   Respiratory:  Positive for cough. Negative for shortness of breath.   Cardiovascular: Negative.   Gastrointestinal: Negative.   Neurological: Negative.       Objective:    Physical Exam Vitals and nursing note reviewed.  Constitutional:      General: She is not in acute distress.    Appearance: Normal appearance.  HENT:     Head: Normocephalic.     Right Ear: External ear normal. There is impacted cerumen.     Left Ear: External ear normal. There is impacted cerumen.  Eyes:     Conjunctiva/sclera: Conjunctivae normal.  Cardiovascular:     Rate and Rhythm: Normal rate and regular rhythm.     Pulses: Normal pulses.     Heart sounds: Normal heart sounds.  Pulmonary:     Effort: Pulmonary effort is normal.     Breath sounds: Normal breath sounds.  Musculoskeletal:     Cervical back: Normal range of motion.  Skin:    General: Skin is warm.  Neurological:     General: No focal deficit present.     Mental Status: She is alert and oriented to person, place, and time.  Psychiatric:        Mood and Affect: Mood normal.        Behavior: Behavior normal.        Thought Content: Thought content normal.        Judgment: Judgment normal.    BP 105/75 (BP Location: Left Arm, Patient Position: Sitting)   Pulse 82    Temp 98.3 F (36.8 C)   Resp 20   Wt 285 lb (129.3 kg)   SpO2 97%   BMI 42.09 kg/m  Wt Readings from Last 3 Encounters:  05/14/21 285 lb (129.3 kg)  03/19/21 280 lb (127 kg)  03/03/21 286 lb 12.8  oz (130.1 kg)    Health Maintenance Due  Topic Date Due   COVID-19 Vaccine (4 - Booster for Moderna series) 11/13/2020   PAP SMEAR-Modifier  12/30/2020   INFLUENZA VACCINE  03/02/2021    There are no preventive care reminders to display for this patient.   Lab Results  Component Value Date   TSH 2.060 10/17/2020   Lab Results  Component Value Date   WBC 12.7 (H) 03/03/2021   HGB 10.9 (L) 03/03/2021   HCT 35.3 03/03/2021   MCV 92 03/03/2021   PLT 306 03/03/2021   Lab Results  Component Value Date   NA 141 03/03/2021   K 4.8 03/03/2021   CO2 23 03/03/2021   GLUCOSE 96 03/03/2021   BUN 14 03/03/2021   CREATININE 0.77 03/03/2021   BILITOT <0.2 03/03/2021   ALKPHOS 109 03/03/2021   AST 11 03/03/2021   ALT 10 03/03/2021   PROT 6.5 03/03/2021   ALBUMIN 4.0 03/03/2021   CALCIUM 9.3 03/03/2021   ANIONGAP 5 01/21/2021   EGFR 88 03/03/2021   Lab Results  Component Value Date   CHOL 203 (H) 10/17/2020   Lab Results  Component Value Date   HDL 73 10/17/2020   Lab Results  Component Value Date   LDLCALC 110 (H) 10/17/2020   Lab Results  Component Value Date   TRIG 115 10/17/2020   No results found for: [facility] Lab Results  Component Value Date   HGBA1C 5.6 01/21/2021       Assessment & Plan:   Problem List Items Addressed This Visit   None Visit Diagnoses     Bilateral impacted cerumen    -  Primary   Ears irrigated bilaterally with good results. Discussed debrox OTC that she can use at home prn. F/U if symptoms worsen or don't improve.    Cough, unspecified type       After getting flu vaccine last week. Can try flonase and claritin or zyrtec daily. F/U if symptoms not improving.         No orders of the defined types were placed in this  encounter.    Charyl Dancer, NP

## 2021-05-14 ENCOUNTER — Ambulatory Visit
Payer: BC Managed Care – PPO | Attending: Student in an Organized Health Care Education/Training Program | Admitting: Student in an Organized Health Care Education/Training Program

## 2021-05-14 ENCOUNTER — Other Ambulatory Visit: Payer: Self-pay

## 2021-05-14 ENCOUNTER — Encounter: Payer: Self-pay | Admitting: Nurse Practitioner

## 2021-05-14 ENCOUNTER — Encounter: Payer: Self-pay | Admitting: Student in an Organized Health Care Education/Training Program

## 2021-05-14 ENCOUNTER — Ambulatory Visit (INDEPENDENT_AMBULATORY_CARE_PROVIDER_SITE_OTHER): Payer: BC Managed Care – PPO | Admitting: Nurse Practitioner

## 2021-05-14 VITALS — BP 112/81 | HR 89 | Temp 98.1°F | Ht 69.0 in | Wt 285.0 lb

## 2021-05-14 VITALS — BP 105/75 | HR 82 | Temp 98.3°F | Resp 20 | Wt 285.0 lb

## 2021-05-14 DIAGNOSIS — G5701 Lesion of sciatic nerve, right lower limb: Secondary | ICD-10-CM | POA: Insufficient documentation

## 2021-05-14 DIAGNOSIS — G894 Chronic pain syndrome: Secondary | ICD-10-CM | POA: Insufficient documentation

## 2021-05-14 DIAGNOSIS — M47818 Spondylosis without myelopathy or radiculopathy, sacral and sacrococcygeal region: Secondary | ICD-10-CM | POA: Diagnosis not present

## 2021-05-14 DIAGNOSIS — M533 Sacrococcygeal disorders, not elsewhere classified: Secondary | ICD-10-CM | POA: Diagnosis not present

## 2021-05-14 DIAGNOSIS — H6123 Impacted cerumen, bilateral: Secondary | ICD-10-CM | POA: Diagnosis not present

## 2021-05-14 DIAGNOSIS — G5702 Lesion of sciatic nerve, left lower limb: Secondary | ICD-10-CM | POA: Insufficient documentation

## 2021-05-14 DIAGNOSIS — R059 Cough, unspecified: Secondary | ICD-10-CM | POA: Diagnosis not present

## 2021-05-14 NOTE — Progress Notes (Signed)
Safety precautions to be maintained throughout the outpatient stay will include: orient to surroundings, keep bed in low position, maintain call bell within reach at all times, provide assistance with transfer out of bed and ambulation.  

## 2021-05-14 NOTE — Progress Notes (Signed)
PROVIDER NOTE: Information contained herein reflects review and annotations entered in association with encounter. Interpretation of such information and data should be left to medically-trained personnel. Information provided to patient can be located elsewhere in the medical record under "Patient Instructions". Document created using STT-dictation technology, any transcriptional errors that may result from process are unintentional.    Patient: Erin Contreras  Service Category: E/M  Provider: Gillis Santa, MD  DOB: 02/22/1961  DOS: 05/14/2021  Specialty: Interventional Pain Management  MRN: [id number]  Setting: Ambulatory outpatient  PCP: Venita Lick, NP  Type: Established Patient    Referring Provider: Venita Lick, NP  Location: Office  Delivery: Face-to-face     HPI  [name], a 60 y.o. year old female, is here today because of her SI joint arthritis [M47.818]. [name] primary complain today is Back Pain (lower) Last encounter: My last encounter with her was on 04/16/2021. Pertinent problems: [name] has OA (osteoarthritis) of knee; Obesity; Chronic radicular lumbar pain; Lumbar spondylosis; Lumbar degenerative disc disease; Sacroiliac joint pain; SI joint arthritis; and Chronic pain syndrome on their pertinent problem list. Pain Assessment: Severity of Chronic pain is reported as a 9 /10. Location: Back Right, Left/pain radaties down her buttock, legs to her foot, calf and foot become numb and tingling. Onset: More than a month ago. Quality: Aching, Constant, Tingling, Stabbing, Numbness. Timing: Constant. Modifying factor(s): nothing. Vitals:  height is _0  (1.753 m) and weight is 285 lb (129.3 kg). Her temperature is 98.1 F (36.7 C). Her blood pressure is 112/81 and her pulse is 89. Her oxygen saturation is 97%.   Reason for encounter: worsening of previously known (established) problem    Increased right low back, right and left buttock, right and left SI  joint pain with occasional radiation to her groin.  She responded favorably to her right sacroiliac joint and right piriformis trigger point injection that was done last March 14th 2022 Given increased pain which is now bilateral and physical exam findings as detailed below, recommend repeating sacroiliac joint and piriformis injection however on both sides with p.o. Valium under fluoroscopy.  Risks and benefits reviewed and patient like to proceed.  At her last visit, her Lyrica was increased from 75 to 100 mg.  She is noticing extremely dry mouth.  I recommend that she reduce to 75 mg for the next week if she still has some capsules at this dose.  Discontinue as this medication is not effective.  She is also failed gabapentin in the past.  ROS  Constitutional: Denies any fever or chills Gastrointestinal: No reported hemesis, hematochezia, vomiting, or acute GI distress Musculoskeletal:  Right low back, right buttock, right hip, right sacroiliac joint pain Neurological: No reported episodes of acute onset apraxia, aphasia, dysarthria, agnosia, amnesia, paralysis, loss of coordination, or loss of consciousness  Medication Review  DULoxetine, Menthol (Topical Analgesic), abatacept, acyclovir, albuterol, celecoxib, diclofenac Sodium, methotrexate, omeprazole, and pregabalin  History Review  Allergy: [name] has No Known Allergies. Drug: [name]  reports no history of drug use. Alcohol:  reports no history of alcohol use. Tobacco:  reports that she has never smoked. She has never used smokeless tobacco. Social: [name]  reports that she has never smoked. She has never used smokeless tobacco. She reports that she does not drink alcohol and does not use drugs. Medical:  has a past medical history of Boils, Ear mass, GERD (gastroesophageal reflux disease), Hemorrhoids, Herpes simplex virus infection, HSV  infection, Lumbago with sciatica, OA (osteoarthritis) of knee, Obesity, Overactive  bladder, PONV (postoperative nausea and vomiting), Pre-diabetes, RA (rheumatoid arthritis) ([place]), Sleep apnea, and Urge and stress incontinence. Surgical: [name]  has a past surgical history that includes Cholecystectomy (2011); Tumor removal (Right, 2010); Sleeve Gastroplasty; Fracture surgery (Right); Knee Arthroplasty (Right, 07/11/2017); Knee Arthroplasty (Left, 01/28/2021); Joint replacement (Left, 01/28/2021); and Colonoscopy (N/A, 03/19/2021). Family: family history includes Cancer in her mother; Cirrhosis in her father; Diabetes in her mother, paternal grandfather, and paternal grandmother; Heart disease in her paternal grandfather and paternal grandmother; Hyperlipidemia in her mother; Hypertension in her daughter and mother; Stroke in her maternal grandfather and maternal grandmother.  Laboratory Chemistry Profile   Renal Lab Results  Component Value Date   BUN 14 03/03/2021   CREATININE 0.77 03/03/2021   BCR 18 03/03/2021   GFRAA 102 06/12/2020   GFRNONAA >60 01/21/2021     Hepatic Lab Results  Component Value Date   AST 11 03/03/2021   ALT 10 03/03/2021   ALBUMIN 4.0 03/03/2021   ALKPHOS 109 03/03/2021   LIPASE 106 05/10/2014     Electrolytes Lab Results  Component Value Date   NA 141 03/03/2021   K 4.8 03/03/2021   CL 103 03/03/2021   CALCIUM 9.3 03/03/2021   MG 2.0 09/04/2014   PHOS 3.7 09/04/2014     Bone Lab Results  Component Value Date   VD25OH 24.3 (L) 10/17/2020     Inflammation (CRP: Acute Phase) (ESR: Chronic Phase) Lab Results  Component Value Date   CRP 0.6 01/21/2021   ESRSEDRATE 43 (H) 01/21/2021       Note: Above Lab results reviewed.  Recent Imaging Review  DG PAIN CLINIC C-ARM 1-60 MIN NO REPORT Fluoro was used, but no Radiologist interpretation will be provided.  Please refer to "NOTES" tab for provider progress note. Note: Reviewed        Physical Exam  General appearance: Well nourished, well developed, and well hydrated. In  no apparent acute distress Mental status: Alert, oriented x 3 (person, place, & time)       Respiratory: No evidence of acute respiratory distress Eyes: PERLA Vitals: BP 112/81   Pulse 89   Temp 98.1 F (36.7 C)   Ht _0  (1.753 m)   Wt 285 lb (129.3 kg)   SpO2 97%   BMI 42.09 kg/m  BMI: Estimated body mass index is 42.09 kg/m as calculated from the following:   Height as of this encounter: _1  (1.753 m).   Weight as of this encounter: 285 lb (129.3 kg). Ideal: Ideal body weight: 66.2 kg (145 lb 15.1 oz) Adjusted ideal body weight: 91.4 kg (201 lb 9.1 oz)  Lumbar Spine Area Exam  Skin & Axial Inspection: No masses, redness, or swelling Alignment: Symmetrical Functional ROM: Pain restricted ROM affecting primarily the right Stability: No instability detected Muscle Tone/Strength: Functionally intact. No obvious neuro-muscular anomalies detected. Sensory (Neurological): Musculoskeletal pain pattern  Provocative Tests:  Patrick's Maneuver: (+) for right-sided S-I arthralgia            and left-sided FABER* test: (+) for right-sided S-I arthralgia            and left-sided S-I anterior distraction/compression test: (+) Right-sided S-I arthralgia/arthropathy and left-sided S-I lateral compression test: (+) Right-sided S-I arthralgia/arthropathy and left-sided  Gait & Posture Assessment  Ambulation: Limited Gait: Antalgic Posture: Difficulty standing up straight, due to pain  Lower Extremity Exam    Side: Right lower  extremity  Side: Left lower extremity  Stability: No instability observed          Stability: No instability observed          Skin & Extremity Inspection: Skin color, temperature, and hair growth are WNL. No peripheral edema or cyanosis. No masses, redness, swelling, asymmetry, or associated skin lesions. No contractures.  Skin & Extremity Inspection: Skin color, temperature, and hair growth are WNL. No peripheral edema or cyanosis. No masses, redness, swelling,  asymmetry, or associated skin lesions. No contractures.  Functional ROM: Pain restricted ROM for hip and knee joints          Functional ROM: Unrestricted ROM                  Muscle Tone/Strength: Functionally intact. No obvious neuro-muscular anomalies detected.  Muscle Tone/Strength: Functionally intact. No obvious neuro-muscular anomalies detected.  Sensory (Neurological): Arthropathic arthralgia        Sensory (Neurological): Unimpaired        DTR: Patellar: deferred today Achilles: deferred today Plantar: deferred today  DTR: Patellar: deferred today Achilles: deferred today Plantar: deferred today  Palpation: No palpable anomalies  Palpation: No palpable anomalies    Assessment   Status Diagnosis  Having a Flare-up Having a Flare-up Having a Flare-up 1. SI joint arthritis   2. Sacroiliac joint pain   3. Piriformis syndrome of right side   4. Piriformis syndrome of left side   5. Chronic pain syndrome      Updated Problems: Problem  Piriformis Syndrome of Left Side    Plan of Care  [name] has a current medication list which includes the following long-term medication(s): orencia, albuterol, duloxetine, omeprazole, and pregabalin.  -Discontinue Lyrica given that it is not effective.  Has failed gabapentin. -Increased bilateral sacroiliac joint pain and piriformis pain.  Physical exam findings consistent with SI joint dysfunction.  Has had positive response to right SI joint and right piriformis injection last March. -Recommend repeating bilaterally  Orders:  Orders Placed This Encounter  Procedures   SACROILIAC JOINT INJECTION    Standing Status:   Future    Standing Expiration Date:   06/14/2021    Scheduling Instructions:     Side: Bilateral     Sedation:PO Valium    Order Specific Question:   Where will this procedure be performed?    Answer:   [facility]   TRIGGER POINT INJECTION    Area: Buttocks region (gluteal area) Indications:  Piriformis muscle pain; Bilateral (G57.03) piriformis-syndrome; piriformis muscle spasms (Z66.063). CPT code: 20552    Standing Status:   Future    Standing Expiration Date:   11/12/2021    Scheduling Instructions:     Type: Myoneural block (TPI) of piriformis muscle.     Side:  B/L     Sedation: Patient's choice.     Timeframe: Today    Order Specific Question:   Where will this procedure be performed?    Answer:   [facility]   Follow-up plan:   Return in about 4 days (around 05/18/2021) for B/L SI-J + B/L Piriformis , minimal sedation (PO Valium).     Right L5-S1 ESI 05/28/2020 only helped for 1 week, return of pain.  Consider diagnostic lumbar facet medial branch nerve blocks in future.      Recent Visits Date Type Provider Dept  04/16/21 Office Visit Gillis Santa, MD [facility]  03/26/21 Telemedicine Gillis Santa, MD [facility]  03/02/21 Procedure visit Gillis Santa, MD [facility]  02/19/21 Office Visit Gillis Santa, MD [facility]  Showing recent visits within past 90 days and meeting all other requirements Today's Visits Date Type Provider Dept  05/14/21 Office Visit Gillis Santa, MD [facility]  Showing today's visits and meeting all other requirements Future Appointments No visits were found meeting these conditions. Showing future appointments within next 90 days and meeting all other requirements I discussed the assessment and treatment plan with the patient. The patient was provided an opportunity to ask questions and all were answered. The patient agreed with the plan and demonstrated an understanding of the instructions.  Patient advised to call back or seek an in-person evaluation if the symptoms or condition worsens.  Duration of encounter: 30 minutes.  Note by: Gillis Santa, MD Date: 05/14/2021; Time: 2:54 PM

## 2021-05-14 NOTE — Patient Instructions (Signed)
Flonase daily Claritin or zyrtec daily

## 2021-05-14 NOTE — Patient Instructions (Signed)
Moderate Conscious Sedation, Adult Sedation is the use of medicines to promote relaxation and to relieve discomfort and anxiety. Moderate conscious sedation is a type of sedation. Under moderate conscious sedation, you are less alert than normal, but you are still able to respond to instructions, touch, or both. Moderate conscious sedation is used during short medical and dental procedures. It is milder than deep sedation, which is a type of sedation under which you cannot be easily woken up. It is also milder than general anesthesia, which is the use of medicines to make you unconscious. Moderate conscious sedation allows you to return to your regular activities sooner. Tell a health care provider about: Any allergies you have. All medicines you are taking, including vitamins, herbs, eye drops, creams, and over-the-counter medicines. Any use of steroids. This includes steroids taken by mouth or as a cream. Any problems you or family members have had with sedatives and anesthetic medicines. Any blood disorders you have. Any surgeries you have had. Any medical conditions you have, such as sleep apnea. Whether you are pregnant or may be pregnant. Any use of cigarettes, alcohol, marijuana, or drugs. What are the risks? Generally, this is a safe procedure. However, problems may occur, including: Getting too much medicine (oversedation). Nausea. Allergic reaction to medicines. Trouble breathing. If this happens, a breathing tube may be used. It will be removed when you are awake and breathing on your own. Heart trouble. Lung trouble. Confusion that gets better with time (emergence delirium). What happens before the procedure? Staying hydrated Follow instructions from your health care provider about hydration, which may include: Up to 2 hours before the procedure - you may continue to drink clear liquids, such as water, clear fruit juice, black coffee, and plain tea. Eating and drinking  restrictions Follow instructions from your health care provider about eating and drinking, which may include: 8 hours before the procedure - stop eating heavy meals or foods, such as meat, fried foods, or fatty foods. 6 hours before the procedure - stop eating light meals or foods, such as toast or cereal. 6 hours before the procedure - stop drinking milk or drinks that contain milk. 2 hours before the procedure - stop drinking clear liquids. Medicines Ask your health care provider about: Changing or stopping your regular medicines. This is especially important if you are taking diabetes medicines or blood thinners. Taking medicines such as aspirin and ibuprofen. These medicines can thin your blood. Do not take these medicines unless your health care provider tells you to take them. Taking over-the-counter medicines, vitamins, herbs, and supplements. Tests and exams You will have a physical exam. You may have blood tests done to show how well: Your kidneys and liver work. Your blood clots. General instructions Plan to have a responsible adult take you home from the hospital or clinic. If you will be going home right after the procedure, plan to have a responsible adult care for you for the time you are told. This is important. What happens during the procedure?  You will be given the sedative. The sedative may be given: As a pill that you will swallow. It can also be inserted into the rectum. As a spray through the nose. As an injection into the muscle. As an injection into the vein through an IV. You may be given oxygen as needed. Your breathing, heart rate, and blood pressure will be monitored during the procedure. The medical or dental procedure will be done. The procedure may vary among health   care providers and hospitals. What happens after the procedure? Your blood pressure, heart rate, breathing rate, and blood oxygen level will be monitored until you leave the hospital or  clinic. You will get fluids through your IV if needed. Do not drive or operate machinery until your health care provider says that it is safe. Summary Sedation is the use of medicines to promote relaxation and to relieve discomfort and anxiety. Moderate conscious sedation is a type of sedation that is used during short medical and dental procedures. Tell the health care provider about any medical conditions that you have and about all the medicines that you are taking. You will be given the sedative as a pill, a spray through the nose, an injection into the muscle, or an injection into the vein through an IV. Vital signs are monitored during the sedation. Moderate conscious sedation allows you to return to your regular activities sooner. This information is not intended to replace advice given to you by your health care provider. Make sure you discuss any questions you have with your health care provider. Document Revised: 11/16/2019 Document Reviewed: 06/14/2019 Elsevier Patient Education  2022 [place]. Sacroiliac (SI) Joint Injection Patient Information  Description: The sacroiliac joint connects the scrum (very low back and tailbone) to the ilium (a pelvic bone which also forms half of the hip joint).  Normally this joint experiences very little motion.  When this joint becomes inflamed or unstable low back and or hip and pelvis pain may result.  Injection of this joint with local anesthetics (numbing medicines) and steroids can provide diagnostic information and reduce pain.  This injection is performed with the aid of x-ray guidance into the tailbone area while you are lying on your stomach.   You may experience an electrical sensation down the leg while this is being done.  You may also experience numbness.  We also may ask if we are reproducing your normal pain during the injection.  Conditions which may be treated SI injection:  Low back, buttock, hip or leg pain  Preparation for  the Injection:  Do not eat any solid food or dairy products within 8 hours of your appointment.  You may drink clear liquids up to 3 hours before appointment.  Clear liquids include water, black coffee, juice or soda.  No milk or cream please. You may take your regular medications, including pain medications with a sip of water before your appointment.  Diabetics should hold regular insulin (if take separately) and take 1/2 normal NPH dose the morning of the procedure.  Carry some sugar containing items with you to your appointment. A driver must accompany you and be prepared to drive you home after your procedure. Bring all of your current medications with you. An IV may be inserted and sedation may be given at the discretion of the physician. A blood pressure cuff, EKG and other monitors will often be applied during the procedure.  Some patients may need to have extra oxygen administered for a short period.  You will be asked to provide medical information, including your allergies, prior to the procedure.  We must know immediately if you are taking blood thinners (like Coumadin/Warfarin) or if you are allergic to IV iodine contrast (dye).  We must know if you could possible be pregnant.  Possible side effects:  Bleeding from needle site Infection (rare, may require surgery) Nerve injury (rare) Numbness & tingling (temporary) A brief convulsion or seizure Light-headedness (temporary) Pain at injection site (several  days) Decreased blood pressure (temporary) Weakness in the leg (temporary)   Call if you experience:  New onset weakness or numbness of an extremity below the injection site that last more than 8 hours. Hives or difficulty breathing ( go to the emergency room) Inflammation or drainage at the injection site Any new symptoms which are concerning to you  Please note:  Although the local anesthetic injected can often make your back/ hip/ buttock/ leg feel good for several  hours after the injections, the pain will likely return.  It takes 3-7 days for steroids to work in the sacroiliac area.  You may not notice any pain relief for at least that one week.  If effective, we will often do a series of three injections spaced 3-6 weeks apart to maximally decrease your pain.  After the initial series, we generally will wait some months before a repeat injection of the same type.  If you have any questions, please call [phone] [facility]

## 2021-05-18 ENCOUNTER — Encounter: Payer: Self-pay | Admitting: Student in an Organized Health Care Education/Training Program

## 2021-05-18 ENCOUNTER — Other Ambulatory Visit: Payer: Self-pay

## 2021-05-18 ENCOUNTER — Ambulatory Visit (HOSPITAL_BASED_OUTPATIENT_CLINIC_OR_DEPARTMENT_OTHER): Payer: BC Managed Care – PPO | Admitting: Student in an Organized Health Care Education/Training Program

## 2021-05-18 ENCOUNTER — Ambulatory Visit
Admission: RE | Admit: 2021-05-18 | Discharge: 2021-05-18 | Disposition: A | Discharge status: Home / Self Care | Payer: BC Managed Care – PPO | Source: Ambulatory Visit | Attending: Student in an Organized Health Care Education/Training Program | Admitting: Student in an Organized Health Care Education/Training Program

## 2021-05-18 VITALS — BP 131/94 | HR 80 | Temp 97.3°F | Resp 16 | Ht 69.0 in | Wt 285.0 lb

## 2021-05-18 DIAGNOSIS — M533 Sacrococcygeal disorders, not elsewhere classified: Secondary | ICD-10-CM | POA: Insufficient documentation

## 2021-05-18 DIAGNOSIS — M461 Sacroiliitis, not elsewhere classified: Secondary | ICD-10-CM

## 2021-05-18 DIAGNOSIS — G5701 Lesion of sciatic nerve, right lower limb: Secondary | ICD-10-CM | POA: Diagnosis not present

## 2021-05-18 DIAGNOSIS — M47818 Spondylosis without myelopathy or radiculopathy, sacral and sacrococcygeal region: Secondary | ICD-10-CM | POA: Diagnosis not present

## 2021-05-18 DIAGNOSIS — Z96653 Presence of artificial knee joint, bilateral: Secondary | ICD-10-CM | POA: Diagnosis not present

## 2021-05-18 DIAGNOSIS — G5702 Lesion of sciatic nerve, left lower limb: Secondary | ICD-10-CM | POA: Diagnosis not present

## 2021-05-18 DIAGNOSIS — G5703 Lesion of sciatic nerve, bilateral lower limbs: Secondary | ICD-10-CM

## 2021-05-18 DIAGNOSIS — Z7952 Long term (current) use of systemic steroids: Secondary | ICD-10-CM | POA: Diagnosis not present

## 2021-05-18 DIAGNOSIS — G894 Chronic pain syndrome: Secondary | ICD-10-CM | POA: Diagnosis not present

## 2021-05-18 MED ORDER — LIDOCAINE HCL 2 % IJ SOLN
INTRAMUSCULAR | Status: AC
  Filled 2021-05-18: qty 20

## 2021-05-18 MED ORDER — ROPIVACAINE HCL 2 MG/ML IJ SOLN
9.0000 mL | Freq: Once | INTRAMUSCULAR | Status: AC
  Administered 2021-05-18: 9 mL via PERINEURAL

## 2021-05-18 MED ORDER — DIAZEPAM 5 MG PO TABS
ORAL_TABLET | ORAL | Status: AC
  Filled 2021-05-18: qty 1

## 2021-05-18 MED ORDER — ROPIVACAINE HCL 2 MG/ML IJ SOLN
9.0000 mL | Freq: Once | INTRAMUSCULAR | Status: AC
  Administered 2021-05-18: 9 mL via INTRA_ARTICULAR

## 2021-05-18 MED ORDER — DEXAMETHASONE SODIUM PHOSPHATE 10 MG/ML IJ SOLN
10.0000 mg | Freq: Once | INTRAMUSCULAR | Status: AC
  Administered 2021-05-18: 10 mg

## 2021-05-18 MED ORDER — METHYLPREDNISOLONE ACETATE 80 MG/ML IJ SUSP
INTRAMUSCULAR | Status: AC
  Filled 2021-05-18: qty 1

## 2021-05-18 MED ORDER — DIAZEPAM 5 MG PO TABS
5.0000 mg | ORAL_TABLET | ORAL | Status: AC
  Administered 2021-05-18: 5 mg via ORAL

## 2021-05-18 MED ORDER — LIDOCAINE HCL 2 % IJ SOLN
20.0000 mL | Freq: Once | INTRAMUSCULAR | Status: AC
  Administered 2021-05-18: 400 mg

## 2021-05-18 MED ORDER — DEXAMETHASONE SODIUM PHOSPHATE 10 MG/ML IJ SOLN
INTRAMUSCULAR | Status: AC
  Filled 2021-05-18: qty 2

## 2021-05-18 MED ORDER — IOHEXOL 180 MG/ML  SOLN
10.0000 mL | Freq: Once | INTRAMUSCULAR | Status: AC
  Administered 2021-05-18: 10 mL via EPIDURAL

## 2021-05-18 MED ORDER — METHYLPREDNISOLONE ACETATE 80 MG/ML IJ SUSP
80.0000 mg | Freq: Once | INTRAMUSCULAR | Status: AC
  Administered 2021-05-18: 80 mg via INTRA_ARTICULAR

## 2021-05-18 MED ORDER — ROPIVACAINE HCL 2 MG/ML IJ SOLN
INTRAMUSCULAR | Status: AC
  Filled 2021-05-18: qty 20

## 2021-05-18 NOTE — Progress Notes (Signed)
Safety precautions to be maintained throughout the outpatient stay will include: orient to surroundings, keep bed in low position, maintain call bell within reach at all times, provide assistance with transfer out of bed and ambulation.  

## 2021-05-18 NOTE — Progress Notes (Signed)
PROVIDER NOTE: Information contained herein reflects review and annotations entered in association with encounter. Interpretation of such information and data should be left to medically-trained personnel. Information provided to patient can be located elsewhere in the medical record under "Patient Instructions". Document created using STT-dictation technology, any transcriptional errors that may result from process are unintentional.    Patient: Erin Contreras  Service Category: Procedure  Provider: Gillis Santa, MD  DOB: Feb 16, 1961  DOS: 05/18/2021  Location: [facility]  MRN: [id number]  Setting: Ambulatory - outpatient  Referring Provider: Venita Lick, NP  Type: Established Patient  Specialty: Interventional Pain Management  PCP: Venita Lick, NP   Primary Reason for Visit: Interventional Pain Management Treatment. CC: Back Pain (Lower, right side is worse)    Procedure:          Anesthesia, Analgesia, Anxiolysis:  Type: Diagnostic Sacroiliac Joint Steroid Injection & Bilateral Piriformis TPI  Region: Inferior Lumbosacral Region Level: PIIS (Posterior Inferior Iliac Spine) Laterality: Bilateral  Type: Local Anesthesia & 5mg  PO Valium Local Anesthetic: Lidocaine 1-2%    Position: Prone           Indications: 1. SI joint arthritis   2. Piriformis syndrome of right side   3. Piriformis syndrome of left side   4. Sacroiliac joint pain   5. Chronic pain syndrome    Pain Score: Pre-procedure: 8 /10 Post-procedure: 0-No pain (moving.standing)/10     Pre-op H&P Assessment:  Erin Contreras is a 60 y.o. (year old), female patient, seen today for interventional treatment. She  has a past surgical history that includes Cholecystectomy (2011); Tumor removal (Right, 2010); Sleeve Gastroplasty; Fracture surgery (Right); Knee Arthroplasty (Right, 07/11/2017); Knee Arthroplasty (Left, 01/28/2021); Joint replacement (Left, 01/28/2021); and Colonoscopy (N/A, 03/19/2021).  Erin Contreras has a current medication list which includes the following prescription(s): orencia, acyclovir, albuterol, celecoxib, diclofenac sodium, duloxetine, menthol (topical analgesic), methotrexate, omeprazole, and pregabalin. Her primarily concern today is the Back Pain (Lower, right side is worse)  Initial Vital Signs:  Pulse/HCG Rate: 83  Temp: (!) 97.3 F (36.3 C) Resp: 15 BP: 132/76 SpO2: 98 %  BMI: Estimated body mass index is 42.09 kg/m as calculated from the following:   Height as of this encounter: 5\' 9"  (1.753 m).   Weight as of this encounter: 285 lb (129.3 kg).  Risk Assessment: Allergies: Reviewed. She has No Known Allergies.  Allergy Precautions: None required Coagulopathies: Reviewed. None identified.  Blood-thinner therapy: None at this time Active Infection(s): Reviewed. None identified. Erin Contreras is afebrile  Site Confirmation: Erin Contreras was asked to confirm the procedure and laterality before marking the site Procedure checklist: Completed Consent: Before the procedure and under the influence of no sedative(s), amnesic(s), or anxiolytics, the patient was informed of the treatment options, risks and possible complications. To fulfill our ethical and legal obligations, as recommended by the American Medical Association's Code of Ethics, I have informed the patient of my clinical impression; the nature and purpose of the treatment or procedure; the risks, benefits, and possible complications of the intervention; the alternatives, including doing nothing; the risk(s) and benefit(s) of the alternative treatment(s) or procedure(s); and the risk(s) and benefit(s) of doing nothing. The patient was provided information about the general risks and possible complications associated with the procedure. These may include, but are not limited to: failure to achieve desired goals, infection, bleeding, organ or nerve damage, allergic reactions, paralysis, and death. In addition,  the patient was informed of those risks and  complications associated to the procedure, such as failure to decrease pain; infection; bleeding; organ or nerve damage with subsequent damage to sensory, motor, and/or autonomic systems, resulting in permanent pain, numbness, and/or weakness of one or several areas of the body; allergic reactions; (i.e.: anaphylactic reaction); and/or death. Furthermore, the patient was informed of those risks and complications associated with the medications. These include, but are not limited to: allergic reactions (i.e.: anaphylactic or anaphylactoid reaction(s)); adrenal axis suppression; blood sugar elevation that in diabetics may result in ketoacidosis or comma; water retention that in patients with history of congestive heart failure may result in shortness of breath, pulmonary edema, and decompensation with resultant heart failure; weight gain; swelling or edema; medication-induced neural toxicity; particulate matter embolism and blood vessel occlusion with resultant organ, and/or nervous system infarction; and/or aseptic necrosis of one or more joints. Finally, the patient was informed that Medicine is not an exact science; therefore, there is also the possibility of unforeseen or unpredictable risks and/or possible complications that may result in a catastrophic outcome. The patient indicated having understood very clearly. We have given the patient no guarantees and we have made no promises. Enough time was given to the patient to ask questions, all of which were answered to the patient's satisfaction. Erin Contreras has indicated that she wanted to continue with the procedure. Attestation: I, the ordering provider, attest that I have discussed with the patient the benefits, risks, side-effects, alternatives, likelihood of achieving goals, and potential problems during recovery for the procedure that I have provided informed consent. Date  Time: 05/18/2021  9:27  AM  Pre-Procedure Preparation:  Monitoring: As per clinic protocol. Respiration, ETCO2, SpO2, BP, heart rate and rhythm monitor placed and checked for adequate function Safety Precautions: Patient was assessed for positional comfort and pressure points before starting the procedure. Time-out: I initiated and conducted the "Time-out" before starting the procedure, as per protocol. The patient was asked to participate by confirming the accuracy of the "Time Out" information. Verification of the correct person, site, and procedure were performed and confirmed by me, the nursing staff, and the patient. "Time-out" conducted as per Joint Commission's Universal Protocol (UP.01.01.01). Time: 1000  Description of Procedure:          Target Area: Superior, posterior, aspect of the sacroiliac fissure Approach: Posterior, paraspinal, ipsilateral approach. Area Prepped: Entire Lower Lumbosacral Region DuraPrep (Iodine Povacrylex [0.7% available iodine] and Isopropyl Alcohol, 74% w/w) Safety Precautions: Aspiration looking for blood return was conducted prior to all injections. At no point did we inject any substances, as a needle was being advanced. No attempts were made at seeking any paresthesias. Safe injection practices and needle disposal techniques used. Medications properly checked for expiration dates. SDV (single dose vial) medications used. Description of the Procedure: Protocol guidelines were followed. The patient was placed in position over the procedure table. The target area was identified and the area prepped in the usual manner. Skin & deeper tissues infiltrated with local anesthetic. Appropriate amount of time allowed to pass for local anesthetics to take effect. The procedure needle was advanced under fluoroscopic guidance into the sacroiliac joint until a firm endpoint was obtained. Proper needle placement secured. Negative aspiration confirmed. Solution injected in intermittent fashion, asking  for systemic symptoms every 0.5cc of injectate. The needles were then removed and the area cleansed, making sure to leave some of the prepping solution back to take advantage of its long term bactericidal properties. Vitals:   05/18/21 0954 05/18/21 1000 05/18/21 1005  05/18/21 1010  BP: (!) 160/95 (!) 132/96 (!) 124/96 (!) 131/94  Pulse: 81 82 79 80  Resp: 16 20 19 16   Temp:      TempSrc:      SpO2: 100% 98% 99% 99%  Weight:      Height:        Start Time: 1000 hrs. End Time: 1007 hrs. Materials:  Needle(s) Type: Spinal Needle Gauge: 22G Length: 5-in Medication(s): Please see orders for medications and dosing details. 10 cc solution made of 9 cc of 0.2% ropivacaine, 1 cc of methylprednisolone, 80 mg/cc. 5 cc injected into the left sacroiliac joint after contrast confirmation, 5 cc injected into the right sacroiliac joint after contrast confirmation.  A piriformis injection was also performed 1 cm inferior, 1 cm lateral and 1 cm deep to the inferior sacroiliac fissure with contrast highlighting piriformis muscle striations.  10 cc solution made of 9 cc of 0.2% ropivacaine, 1 cc of Decadron 10 mg/cc, 5 cc was injected into the right piriformis muscle under fluoroscopy, 5 cc was injected into the left piriformis muscle under fluoroscopy.  Patient denied any pain radiating down her right leg and left leg respectively during injection.   Imaging Guidance (Non-Spinal):          Type of Imaging Technique: Fluoroscopy Guidance (Non-Spinal) Indication(s): Assistance in needle guidance and placement for procedures requiring needle placement in or near specific anatomical locations not easily accessible without such assistance. Exposure Time: Please see nurses notes. Contrast: Before injecting any contrast, we confirmed that the patient did not have an allergy to iodine, shellfish, or radiological contrast. Once satisfactory needle placement was completed at the desired level, radiological contrast  was injected. Contrast injected under live fluoroscopy. No contrast complications. See chart for type and volume of contrast used. Fluoroscopic Guidance: I was personally present during the use of fluoroscopy. "Tunnel Vision Technique" used to obtain the best possible view of the target area. Parallax error corrected before commencing the procedure. "Direction-depth-direction" technique used to introduce the needle under continuous pulsed fluoroscopy. Once target was reached, antero-posterior, oblique, and lateral fluoroscopic projection used confirm needle placement in all planes. Images permanently stored in EMR. Interpretation: I personally interpreted the imaging intraoperatively. Adequate needle placement confirmed in multiple planes. Appropriate spread of contrast into desired area was observed. No evidence of afferent or efferent intravascular uptake. Permanent images saved into the patient's record.   Post-operative Assessment:  Post-procedure Vital Signs:  Pulse/HCG Rate: 80 (nsr)  Temp:  (!) 97.3 F (36.3 C) Resp: 16 BP: (!) 131/94 SpO2: 99 %  EBL: None  Complications: No immediate post-treatment complications observed by team, or reported by patient.  Note: The patient tolerated the entire procedure well. A repeat set of vitals were taken after the procedure and the patient was kept under observation following institutional policy, for this type of procedure. Post-procedural neurological assessment was performed, showing return to baseline, prior to discharge. The patient was provided with post-procedure discharge instructions, including a section on how to identify potential problems. Should any problems arise concerning this procedure, the patient was given instructions to immediately contact us, at any time, without hesitation. In any case, we plan to contact the patient by telephone for a follow-up status report regarding this interventional procedure.  Comments:  No additional  relevant information.  Plan of Care  Orders:  Orders Placed This Encounter  Procedures   DG PAIN CLINIC C-ARM 1-60 MIN NO REPORT    Intraoperative interpretation by procedural physician  at [place].    Standing Status:   Standing    Number of Occurrences:   1    Order Specific Question:   Reason for exam:    Answer:   Assistance in needle guidance and placement for procedures requiring needle placement in or near specific anatomical locations not easily accessible without such assistance.     Medications ordered for procedure: Meds ordered this encounter  Medications   iohexol (OMNIPAQUE) 180 MG/ML injection 10 mL    Must be Myelogram-compatible. If not available, you may substitute with a water-soluble, non-ionic, hypoallergenic, myelogram-compatible radiological contrast medium.   lidocaine (XYLOCAINE) 2 % (with pres) injection 400 mg   diazepam (VALIUM) tablet 5 mg    Make sure Flumazenil is available in the pyxis when using this medication. If oversedation occurs, administer 0.2 mg IV over 15 sec. If after 45 sec no response, administer 0.2 mg again over 1 min; may repeat at 1 min intervals; not to exceed 4 doses (1 mg)   dexamethasone (DECADRON) injection 10 mg   methylPREDNISolone acetate (DEPO-MEDROL) injection 80 mg   ropivacaine (PF) 2 mg/mL (0.2%) (NAROPIN) injection 9 mL   ropivacaine (PF) 2 mg/mL (0.2%) (NAROPIN) injection 9 mL   Medications administered: We administered iohexol, lidocaine, diazepam, dexamethasone, methylPREDNISolone acetate, ropivacaine (PF) 2 mg/mL (0.2%), and ropivacaine (PF) 2 mg/mL (0.2%).  See the medical record for exact dosing, route, and time of administration.  Follow-up plan:   Return in about 5 weeks (around 06/22/2021) for Post Procedure Evaluation, virtual.      Disposition: Discharge home  Discharge (Date  Time): 05/18/2021;   hrs.   Primary Care Physician: Venita Lick, NP Location: [facility] Note by: Gillis Santa, MD Date: 05/18/2021; Time: 10:12 AM  Disclaimer:  Medicine is not an exact science. The only guarantee in medicine is that nothing is guaranteed. It is important to note that the decision to proceed with this intervention was based on the information collected from the patient. The Data and conclusions were drawn from the patient's questionnaire, the interview, and the physical examination. Because the information was provided in large part by the patient, it cannot be guaranteed that it has not been purposely or unconsciously manipulated. Every effort has been made to obtain as much relevant data as possible for this evaluation. It is important to note that the conclusions that lead to this procedure are derived in large part from the available data. Always take into account that the treatment will also be dependent on availability of resources and existing treatment guidelines, considered by other Pain Management Practitioners as being common knowledge and practice, at the time of the intervention. For Medico-Legal purposes, it is also important to point out that variation in procedural techniques and pharmacological choices are the acceptable norm. The indications, contraindications, technique, and results of the above procedure should only be interpreted and judged by a Board-Certified Interventional Pain Specialist with extensive familiarity and expertise in the same exact procedure and technique.

## 2021-05-18 NOTE — Patient Instructions (Signed)

## 2021-05-19 ENCOUNTER — Telehealth: Payer: Self-pay | Admitting: *Deleted

## 2021-05-19 NOTE — Telephone Encounter (Signed)
Called for post procedure check. States she had a slight HA yesterday afternoon but it has resolved this morning. Denies any other symptoms. Instructed to call for return of HA that will not go away or any new symptoms.

## 2021-06-09 DIAGNOSIS — J069 Acute upper respiratory infection, unspecified: Secondary | ICD-10-CM | POA: Diagnosis not present

## 2021-06-09 DIAGNOSIS — R059 Cough, unspecified: Secondary | ICD-10-CM | POA: Diagnosis not present

## 2021-06-11 ENCOUNTER — Telehealth: Payer: Self-pay | Admitting: Student in an Organized Health Care Education/Training Program

## 2021-06-11 NOTE — Telephone Encounter (Signed)
Takes Lyrica 100 mg twice per day. Has VV on 06-22-21.

## 2021-06-13 ENCOUNTER — Other Ambulatory Visit: Payer: Self-pay | Admitting: Internal Medicine

## 2021-06-13 NOTE — Telephone Encounter (Signed)
Requested Prescriptions  Pending Prescriptions Disp Refills  . omeprazole (PRILOSEC) 20 MG capsule [Pharmacy Med Name: OMEPRAZOLE DR 20 MG CAPSULE] 90 capsule 1    Sig: TAKE 1 CAPSULE BY MOUTH EVERY DAY     Gastroenterology: Proton Pump Inhibitors Passed - 06/13/2021 12:46 AM      Passed - Valid encounter within last 12 months    Recent Outpatient Visits          1 month ago Bilateral impacted cerumen   [place], Lauren A, NP   3 months ago Dizziness   [facility] Vigg, Avanti, MD   5 months ago Vaginal discharge   [place], Lauren A, NP   7 months ago Depression, major, single episode, moderate ([place])   [place], Jolene T, NP   8 months ago Impacted cerumen, right ear   [place], Barbaraann Faster, NP

## 2021-06-16 DIAGNOSIS — M0579 Rheumatoid arthritis with rheumatoid factor of multiple sites without organ or systems involvement: Secondary | ICD-10-CM | POA: Diagnosis not present

## 2021-06-19 ENCOUNTER — Encounter: Payer: Self-pay | Admitting: Student in an Organized Health Care Education/Training Program

## 2021-06-22 ENCOUNTER — Other Ambulatory Visit: Payer: Self-pay

## 2021-06-22 ENCOUNTER — Ambulatory Visit
Payer: BC Managed Care – PPO | Attending: Student in an Organized Health Care Education/Training Program | Admitting: Student in an Organized Health Care Education/Training Program

## 2021-06-22 DIAGNOSIS — G894 Chronic pain syndrome: Secondary | ICD-10-CM

## 2021-06-22 DIAGNOSIS — G5702 Lesion of sciatic nerve, left lower limb: Secondary | ICD-10-CM

## 2021-06-22 DIAGNOSIS — G5701 Lesion of sciatic nerve, right lower limb: Secondary | ICD-10-CM | POA: Diagnosis not present

## 2021-06-22 DIAGNOSIS — M533 Sacrococcygeal disorders, not elsewhere classified: Secondary | ICD-10-CM | POA: Diagnosis not present

## 2021-06-22 DIAGNOSIS — M47818 Spondylosis without myelopathy or radiculopathy, sacral and sacrococcygeal region: Secondary | ICD-10-CM

## 2021-06-22 DIAGNOSIS — M461 Sacroiliitis, not elsewhere classified: Secondary | ICD-10-CM

## 2021-06-22 MED ORDER — CELECOXIB 200 MG PO CAPS: 200.0000 mg | ORAL_CAPSULE | Freq: Every day | ORAL | 1 refills | Status: DC

## 2021-06-22 MED ORDER — PREGABALIN 100 MG PO CAPS: 100.0000 mg | ORAL_CAPSULE | Freq: Two times a day (BID) | ORAL | 2 refills | Status: DC

## 2021-06-22 NOTE — Progress Notes (Signed)
Patient: Erin Contreras  Service Category: E/M  Provider: Gillis Santa, MD  DOB: 04-27-1961  DOS: 06/22/2021  Location: Office  MRN: [id number]  Setting: Ambulatory outpatient  Referring Provider: Venita Lick, NP  Type: Established Patient  Specialty: Interventional Pain Management  PCP: Venita Lick, NP  Location: Remote location  Delivery: TeleHealth     Virtual Encounter - Pain Management PROVIDER NOTE: Information contained herein reflects review and annotations entered in association with encounter. Interpretation of such information and data should be left to medically-trained personnel. Information provided to patient can be located elsewhere in the medical record under "Patient Instructions". Document created using STT-dictation technology, any transcriptional errors that may result from process are unintentional.    Contact & Pharmacy Preferred: [phone] Home: [phone] (home) Mobile: [phone] (mobile) E-mail: No e-mail address on record  [id number] [place], [place] - [address] [place] [address] Phone: [phone] Fax: [phone]   Pre-screening  Erin Contreras offered "in-person" vs "virtual" encounter. She indicated preferring virtual for this encounter.   Reason COVID-19*  Social distancing based on CDC and AMA recommendations.   I contacted Erin Contreras on 06/22/2021 via telephone.      I clearly identified myself as Gillis Santa, MD. I verified that I was speaking with the correct person using two identifiers (Name: Erin Contreras, and date of birth: 1961/06/05).  Consent I sought verbal advanced consent from Erin Contreras for virtual visit interactions. I informed Erin Contreras of possible security and privacy concerns, risks, and limitations associated with providing "not-in-person" medical evaluation and management services. I also informed Erin Contreras of the availability of "in-person" appointments. Finally, I  informed her that there would be a charge for the virtual visit and that she could be  personally, fully or partially, financially responsible for it. Erin Contreras expressed understanding and agreed to proceed.   Historic Elements   Erin Contreras is a 60 y.o. year old, female patient evaluated today after our last contact on 06/11/2021. Erin Contreras  has a past medical history of Boils, Ear mass, GERD (gastroesophageal reflux disease), Hemorrhoids, Herpes simplex virus infection, HSV infection, Lumbago with sciatica, OA (osteoarthritis) of knee, Obesity, Overactive bladder, PONV (postoperative nausea and vomiting), Pre-diabetes, RA (rheumatoid arthritis) ([place]), Sleep apnea, and Urge and stress incontinence. She also  has a past surgical history that includes Cholecystectomy (2011); Tumor removal (Right, 2010); Sleeve Gastroplasty; Fracture surgery (Right); Knee Arthroplasty (Right, 07/11/2017); Knee Arthroplasty (Left, 01/28/2021); Joint replacement (Left, 01/28/2021); and Colonoscopy (N/A, 03/19/2021). Erin Contreras has a current medication list which includes the following prescription(s): orencia, acyclovir, albuterol, diclofenac sodium, duloxetine, menthol (topical analgesic), methotrexate, omeprazole, celecoxib, and pregabalin. She  reports that she has never smoked. She has never used smokeless tobacco. She reports that she does not drink alcohol and does not use drugs. Erin Contreras has No Known Allergies.   HPI  Today, she is being contacted for a post-procedure assessment.   Post-Procedure Evaluation  Procedure (05/18/2021):    Type: Diagnostic Sacroiliac Joint Steroid Injection & Bilateral Piriformis TPI  Region: Inferior Lumbosacral Region Level: PIIS (Posterior Inferior Iliac Spine) Laterality: Bilateral  Anxiolysis: Please see nurses note.  Effectiveness during initial hour after procedure (Ultra-Short Term Relief): 100 %   Local anesthetic used: Long-acting (4-6 hours) Effectiveness:  Defined as any analgesic benefit obtained secondary to the administration of local anesthetics. This carries significant diagnostic value as to the etiological location, or anatomical origin, of  the pain. Duration of benefit is expected to coincide with the duration of the local anesthetic used.  Effectiveness during initial 4-6 hours after procedure (Short-Term Relief): 100 %   Long-term benefit: Defined as any relief past the pharmacologic duration of the local anesthetics.  Effectiveness past the initial 6 hours after procedure (Long-Term Relief): 40 % -50%  Benefits, current: Defined as benefit present at the time of this evaluation.   Analgesia:  50% Function: Somewhat improved  Overall very good response after SI joint and piriformis injection.  Patient states that this injection was more helpful than any of her previous ones.  She states that work is more manageable.  Requesting refill of her Celebrex and Lyrica.  I recommend that she decrease her Celebrex to 200 mg daily.  I advised her on the risk of renal dysfunction as well as cardiovascular risk with chronic NSAID therapy.  Can repeat SI joint and piriformis injection in future given return of pain.  Patient in agreement with plan.  Laboratory Chemistry Profile   Renal Lab Results  Component Value Date   BUN 14 03/03/2021   CREATININE 0.77 03/03/2021   BCR 18 03/03/2021   GFRAA 102 06/12/2020   GFRNONAA >60 01/21/2021    Hepatic Lab Results  Component Value Date   AST 11 03/03/2021   ALT 10 03/03/2021   ALBUMIN 4.0 03/03/2021   ALKPHOS 109 03/03/2021   LIPASE 106 05/10/2014    Electrolytes Lab Results  Component Value Date   NA 141 03/03/2021   K 4.8 03/03/2021   CL 103 03/03/2021   CALCIUM 9.3 03/03/2021   MG 2.0 09/04/2014   PHOS 3.7 09/04/2014    Bone Lab Results  Component Value Date   VD25OH 24.3 (L) 10/17/2020    Inflammation (CRP: Acute Phase) (ESR: Chronic Phase) Lab Results  Component Value Date    CRP 0.6 01/21/2021   ESRSEDRATE 43 (H) 01/21/2021         Note: Above Lab results reviewed.   Assessment  The primary encounter diagnosis was SI joint arthritis. Diagnoses of Piriformis syndrome of right side, Piriformis syndrome of left side, Sacroiliac joint pain, and Chronic pain syndrome were also pertinent to this visit.  Plan of Care    Erin Contreras has a current medication list which includes the following long-term medication(s): orencia, albuterol, duloxetine, omeprazole, and pregabalin.  Pharmacotherapy (Medications Ordered): Meds ordered this encounter  Medications   celecoxib (CELEBREX) 200 MG capsule    Sig: Take 1 capsule (200 mg total) by mouth daily.    Dispense:  30 capsule    Refill:  1   pregabalin (LYRICA) 100 MG capsule    Sig: Take 1 capsule (100 mg total) by mouth 2 (two) times daily.    Dispense:  60 capsule    Refill:  2    Fill one day early if pharmacy is closed on scheduled refill date. May substitute for generic if available.    Repeat SI joint and piriformis injection as needed.  Really good response with previous one.  Follow-up plan:   Return in about 3 months (around 09/29/2021) for Medication Management, in person.    Recent Visits Date Type Provider Dept  05/18/21 Procedure visit Gillis Santa, MD [facility]  05/14/21 Office Visit Gillis Santa, MD [facility]  04/16/21 Office Visit Gillis Santa, MD [facility]  03/26/21 Telemedicine Gillis Santa, MD [facility]  Showing recent visits within past 90 days and  meeting all other requirements Today's Visits Date Type Provider Dept  06/22/21 Office Visit Gillis Santa, MD [facility]  Showing today's visits and meeting all other requirements Future Appointments No visits were found meeting these conditions. Showing future appointments within next 90 days and meeting all other requirements I discussed the assessment and treatment  plan with the patient. The patient was provided an opportunity to ask questions and all were answered. The patient agreed with the plan and demonstrated an understanding of the instructions.  Patient advised to call back or seek an in-person evaluation if the symptoms or condition worsens.  Duration of encounter: 74minutes.  Note by: Gillis Santa, MD Date: 06/22/2021; Time: 3:30 PM

## 2021-06-26 ENCOUNTER — Ambulatory Visit: Payer: Self-pay | Admitting: *Deleted

## 2021-06-26 NOTE — Telephone Encounter (Signed)
Patient is calling to report she has frequency that has gotten worse and odor with urine. Patient advised office closed today for holiday- advised UC/virtual care for her symptoms.

## 2021-06-26 NOTE — Telephone Encounter (Signed)
Reason for Disposition  Urinating more frequently than usual (i.e., frequency)  Answer Assessment - Initial Assessment Questions 1. SYMPTOM: "What's the main symptom you're concerned about?" (e.g., frequency, incontinence)     Frequency, odor with urination 2. ONSET: "When did the  frequency  start?"     1 month- worse 3. PAIN: "Is there any pain?" If Yes, ask: "How bad is it?" (Scale: 1-10; mild, moderate, severe)     No pain 4. CAUSE: "What do you think is causing the symptoms?"     UTI infection- August 5. OTHER SYMPTOMS: "Do you have any other symptoms?" (e.g., fever, flank pain, blood in urine, pain with urination)     no 6. PREGNANCY: "Is there any chance you are pregnant?" "When was your last menstrual period?"     na  Protocols used: Urinary Symptoms-A-AH

## 2021-06-30 ENCOUNTER — Other Ambulatory Visit: Payer: Self-pay

## 2021-06-30 ENCOUNTER — Encounter: Payer: Self-pay | Admitting: Nurse Practitioner

## 2021-06-30 ENCOUNTER — Ambulatory Visit (INDEPENDENT_AMBULATORY_CARE_PROVIDER_SITE_OTHER): Payer: BC Managed Care – PPO | Admitting: Nurse Practitioner

## 2021-06-30 VITALS — BP 131/82 | HR 86 | Temp 98.1°F | Wt 288.0 lb

## 2021-06-30 DIAGNOSIS — N898 Other specified noninflammatory disorders of vagina: Secondary | ICD-10-CM

## 2021-06-30 DIAGNOSIS — N3281 Overactive bladder: Secondary | ICD-10-CM

## 2021-06-30 DIAGNOSIS — R350 Frequency of micturition: Secondary | ICD-10-CM

## 2021-06-30 NOTE — Progress Notes (Signed)
Acute Office Visit  Subjective:    Patient ID: Erin Contreras, female    DOB: May 21, 1961, 60 y.o.   MRN: [id number]  Chief Complaint  Patient presents with   Urinary Tract Infection    Urination frequency for about 3 weeks.    HPI Patient is in today for urinary frequency for 3 weeks.  URINARY SYMPTOMS  Dysuria: no Urinary frequency: yes Urgency: yes Small volume voids:  sometimes Symptom severity: mild Urinary incontinence: no Foul odor: yes Hematuria: no Abdominal pain: no Back pain: yes - chronic Suprapubic pain/pressure: yes Flank pain: no Fever:  no Vomiting: no Relief with cranberry juice:  n/a Relief with pyridium: n/a Status: worse Previous urinary tract infection: yes Recurrent urinary tract infection: no Treatments attempted: none    Past Medical History:  Diagnosis Date   Boils    Ear mass    GERD (gastroesophageal reflux disease)    Hemorrhoids    Herpes simplex virus infection    HSV infection    Lumbago with sciatica    OA (osteoarthritis) of knee    Obesity    Overactive bladder    PONV (postoperative nausea and vomiting)    Pre-diabetes    RA (rheumatoid arthritis) ([facility])    Sleep apnea    does not use cpap-had bariatric surgery and has no issues since surgery   Urge and stress incontinence     Past Surgical History:  Procedure Laterality Date   CHOLECYSTECTOMY  2011   COLONOSCOPY N/A 03/19/2021   Procedure: COLONOSCOPY;  Surgeon: Wyline Mood, MD;  Location: [facility] ENDOSCOPY;  Service: Gastroenterology;  Laterality: N/A;   FRACTURE SURGERY Right    ankle as a child   JOINT REPLACEMENT Left 01/28/2021   KNEE ARTHROPLASTY Right 07/11/2017   Procedure: COMPUTER ASSISTED TOTAL KNEE ARTHROPLASTY;  Surgeon: Donato Heinz, MD;  Location: [facility] ORS;  Service: Orthopedics;  Laterality: Right;   KNEE ARTHROPLASTY Left 01/28/2021   Procedure: COMPUTER ASSISTED TOTAL KNEE ARTHROPLASTY;  Surgeon: Donato Heinz, MD;  Location: [facility] ORS;   Service: Orthopedics;  Laterality: Left;   SLEEVE GASTROPLASTY     TUMOR REMOVAL Right 2010   ear    Family History  Problem Relation Age of Onset   Cancer Mother        liver   Diabetes Mother    Hyperlipidemia Mother    Hypertension Mother    Cirrhosis Father        liver   Hypertension Daughter    Stroke Maternal Grandmother    Stroke Maternal Grandfather    Heart disease Paternal Grandmother    Diabetes Paternal Grandmother    Heart disease Paternal Grandfather    Diabetes Paternal Grandfather     Social History   Socioeconomic History   Marital status: Single    Spouse name: Not on file   Number of children: Not on file   Years of education: Not on file   Highest education level: Not on file  Occupational History   Not on file  Tobacco Use   Smoking status: Never   Smokeless tobacco: Never  Vaping Use   Vaping Use: Never used  Substance and Sexual Activity   Alcohol use: No   Drug use: No   Sexual activity: Not on file  Other Topics Concern   Not on file  Social History Narrative   Not on file   Social Determinants of Health   Financial Resource Strain: Not on file  Food Insecurity: Not  on file  Transportation Needs: Not on file  Physical Activity: Not on file  Stress: Not on file  Social Connections: Not on file  Intimate Partner Violence: Not on file    Outpatient Medications Prior to Visit  Medication Sig Dispense Refill   abatacept (ORENCIA) 250 MG injection Inject into the vein every 30 (thirty) days.     acyclovir (ZOVIRAX) 400 MG tablet TAKE 1 TABLET BY MOUTH TWICE A DAY AS NEEDED 180 tablet 0   albuterol (VENTOLIN HFA) 108 (90 Base) MCG/ACT inhaler Inhale 2 puffs into the lungs every 6 (six) hours as needed for wheezing or shortness of breath. 18 g 1   celecoxib (CELEBREX) 200 MG capsule Take 1 capsule (200 mg total) by mouth daily. 30 capsule 1   diclofenac Sodium (VOLTAREN) 1 % GEL Apply topically 4 (four) times daily.     DULoxetine  (CYMBALTA) 30 MG capsule Take 1 capsule (30 mg total) by mouth daily. 30 capsule 5   Menthol, Topical Analgesic, (BIOFREEZE ROLL-ON EX) Apply 1 application topically as needed.     methotrexate (RHEUMATREX) 2.5 MG tablet Take 2.5 mg by mouth once a week. On Sundays     omeprazole (PRILOSEC) 20 MG capsule TAKE 1 CAPSULE BY MOUTH EVERY DAY 90 capsule 1   pregabalin (LYRICA) 100 MG capsule Take 1 capsule (100 mg total) by mouth 2 (two) times daily. 60 capsule 2   No facility-administered medications prior to visit.    No Known Allergies  Review of Systems  Constitutional:  Positive for fatigue. Negative for fever.  Respiratory: Negative.    Cardiovascular: Negative.   Gastrointestinal:  Positive for abdominal pain (lower abdominal pressure).  Genitourinary:  Positive for frequency, urgency and vaginal discharge. Negative for dysuria.  Musculoskeletal:  Positive for back pain.  Skin: Negative.   Neurological: Negative.       Objective:    Physical Exam Vitals and nursing note reviewed.  Constitutional:      General: She is not in acute distress.    Appearance: Normal appearance.  HENT:     Head: Normocephalic.  Eyes:     Conjunctiva/sclera: Conjunctivae normal.  Cardiovascular:     Rate and Rhythm: Normal rate and regular rhythm.     Pulses: Normal pulses.     Heart sounds: Normal heart sounds.  Pulmonary:     Effort: Pulmonary effort is normal.     Breath sounds: Normal breath sounds.  Abdominal:     Palpations: Abdomen is soft.     Tenderness: There is no abdominal tenderness. There is no right CVA tenderness or left CVA tenderness.  Musculoskeletal:     Cervical back: Normal range of motion.  Skin:    General: Skin is warm.  Neurological:     General: No focal deficit present.     Mental Status: She is alert and oriented to person, place, and time.  Psychiatric:        Mood and Affect: Mood normal.        Behavior: Behavior normal.        Thought Content: Thought  content normal.        Judgment: Judgment normal.    BP 131/82   Pulse 86   Temp 98.1 F (36.7 C) (Oral)   Wt 288 lb (130.6 kg)   SpO2 96%   BMI 42.53 kg/m  Wt Readings from Last 3 Encounters:  06/30/21 288 lb (130.6 kg)  05/18/21 285 lb (129.3 kg)  05/14/21 285 lb (  129.3 kg)    Health Maintenance Due  Topic Date Due   Zoster Vaccines- Shingrix (1 of 2) Never done   COVID-19 Vaccine (4 - Booster for Moderna series) 10/16/2020   PAP SMEAR-Modifier  12/30/2020    There are no preventive care reminders to display for this patient.   Lab Results  Component Value Date   TSH 2.060 10/17/2020   Lab Results  Component Value Date   WBC 12.7 (H) 03/03/2021   HGB 10.9 (L) 03/03/2021   HCT 35.3 03/03/2021   MCV 92 03/03/2021   PLT 306 03/03/2021   Lab Results  Component Value Date   NA 141 03/03/2021   K 4.8 03/03/2021   CO2 23 03/03/2021   GLUCOSE 96 03/03/2021   BUN 14 03/03/2021   CREATININE 0.77 03/03/2021   BILITOT <0.2 03/03/2021   ALKPHOS 109 03/03/2021   AST 11 03/03/2021   ALT 10 03/03/2021   PROT 6.5 03/03/2021   ALBUMIN 4.0 03/03/2021   CALCIUM 9.3 03/03/2021   ANIONGAP 5 01/21/2021   EGFR 88 03/03/2021   Lab Results  Component Value Date   CHOL 203 (H) 10/17/2020   Lab Results  Component Value Date   HDL 73 10/17/2020   Lab Results  Component Value Date   LDLCALC 110 (H) 10/17/2020   Lab Results  Component Value Date   TRIG 115 10/17/2020   No results found for: [facility] Lab Results  Component Value Date   HGBA1C 5.6 01/21/2021       Assessment & Plan:   Problem List Items Addressed This Visit   None Visit Diagnoses     Urine frequency    -  Primary   U/A negative for bacteria and blood. Shows calcium oxalate crystals. No signs of active kidney stones with blood in urine or pain. Treat for overactive bladder   Relevant Orders   Urinalysis, Routine w reflex microscopic   Vaginal discharge       Wet prep negative   Relevant  Orders   WET PREP FOR TRICH, YEAST, CLUE   Overactive bladder       U/A, wet prep negative. Will treat for overactive bladder with lifestyle changes, kegel exercises, limiting caffeine and soda. F/U in 4 weeks.         No orders of the defined types were placed in this encounter.    Charyl Dancer, NP

## 2021-07-01 LAB — URINALYSIS, ROUTINE W REFLEX MICROSCOPIC
Bilirubin, UA: NEGATIVE
Glucose, UA: NEGATIVE
Ketones, UA: NEGATIVE
Nitrite, UA: NEGATIVE
Protein,UA: NEGATIVE
RBC, UA: NEGATIVE
Specific Gravity, UA: >1.03 — ABNORMAL HIGH (ref 1.005–1.030)
Urobilinogen, Ur: 0.2 mg/dL (ref 0.2–1.0)
pH, UA: 5.5 (ref 5.0–7.5)

## 2021-07-01 LAB — MICROSCOPIC EXAMINATION
Bacteria, UA: NONE SEEN
RBC, Urine: NONE SEEN /hpf (ref 0–2)

## 2021-07-01 LAB — WET PREP FOR TRICH, YEAST, CLUE
Clue Cell Exam: NEGATIVE
Trichomonas Exam: NEGATIVE
Yeast Exam: NEGATIVE

## 2021-07-24 DIAGNOSIS — Z7960 Long term (current) use of unspecified immunomodulators and immunosuppressants: Secondary | ICD-10-CM | POA: Diagnosis not present

## 2021-07-24 DIAGNOSIS — M0579 Rheumatoid arthritis with rheumatoid factor of multiple sites without organ or systems involvement: Secondary | ICD-10-CM | POA: Diagnosis not present

## 2021-07-31 DIAGNOSIS — M0579 Rheumatoid arthritis with rheumatoid factor of multiple sites without organ or systems involvement: Secondary | ICD-10-CM | POA: Diagnosis not present

## 2021-08-05 ENCOUNTER — Encounter: Payer: Self-pay | Admitting: Nurse Practitioner

## 2021-08-05 ENCOUNTER — Other Ambulatory Visit: Payer: Self-pay

## 2021-08-05 ENCOUNTER — Ambulatory Visit: Payer: BC Managed Care – PPO | Admitting: Nurse Practitioner

## 2021-08-05 VITALS — BP 109/73 | HR 93 | Temp 99.7°F | Wt 290.0 lb

## 2021-08-05 DIAGNOSIS — D3501 Benign neoplasm of right adrenal gland: Secondary | ICD-10-CM | POA: Diagnosis not present

## 2021-08-05 DIAGNOSIS — B354 Tinea corporis: Secondary | ICD-10-CM

## 2021-08-05 DIAGNOSIS — R399 Unspecified symptoms and signs involving the genitourinary system: Secondary | ICD-10-CM

## 2021-08-05 DIAGNOSIS — R7303 Prediabetes: Secondary | ICD-10-CM

## 2021-08-05 DIAGNOSIS — N3946 Mixed incontinence: Secondary | ICD-10-CM

## 2021-08-05 DIAGNOSIS — M0579 Rheumatoid arthritis with rheumatoid factor of multiple sites without organ or systems involvement: Secondary | ICD-10-CM

## 2021-08-05 DIAGNOSIS — G894 Chronic pain syndrome: Secondary | ICD-10-CM

## 2021-08-05 LAB — URINALYSIS, ROUTINE W REFLEX MICROSCOPIC
Bilirubin, UA: NEGATIVE
Glucose, UA: NEGATIVE
Ketones, UA: NEGATIVE
Nitrite, UA: NEGATIVE
Protein,UA: NEGATIVE
RBC, UA: NEGATIVE
Specific Gravity, UA: 1.025 (ref 1.005–1.030)
Urobilinogen, Ur: 0.2 mg/dL (ref 0.2–1.0)
pH, UA: 6 (ref 5.0–7.5)

## 2021-08-05 LAB — MICROSCOPIC EXAMINATION: RBC, Urine: NONE SEEN /hpf (ref 0–2)

## 2021-08-05 LAB — WET PREP FOR TRICH, YEAST, CLUE
Clue Cell Exam: NEGATIVE
Trichomonas Exam: NEGATIVE
Yeast Exam: NEGATIVE

## 2021-08-05 MED ORDER — NYSTATIN 100000 UNIT/GM EX POWD: 1.0000 "application " | Freq: Three times a day (TID) | CUTANEOUS | 3 refills | Status: DC

## 2021-08-05 NOTE — Assessment & Plan Note (Signed)
Chronic, ongoing, followed by rheumatology.  Consider current treatment regimen as ordered by them.  Recent notes reviewed.

## 2021-08-05 NOTE — Assessment & Plan Note (Signed)
Chronic, ongoing, followed by pain management.  Continue this collaboration and current treatment regimen as ordered by them.  Recent notes reviewed.   

## 2021-08-05 NOTE — Assessment & Plan Note (Signed)
A1c 5.6% last visit, recheck today due to urinary frequency.  Continue diet monitoring and initiate medication as needed.

## 2021-08-05 NOTE — Patient Instructions (Signed)

## 2021-08-05 NOTE — Assessment & Plan Note (Signed)
Chronic, ongoing with symptoms more at night.  Suspect some pelvic floor dysfunction.  Would benefit from modest weight loss.  At this time recommend reduction in fluid intake 2-3 hours prior to bedtime.  Referral for pelvic floor therapy with PT.  Will consider addition of medication in future, such as Ditropan or Myrbetriq.  Return in 6 weeks.  Labs today A1c, TSH, CBC, CMP.

## 2021-08-05 NOTE — Assessment & Plan Note (Signed)
Noted on past imaging -- slight increase in size on recent imaging 04/21/20.  Plan on repeat imaging upcoming months and if ongoing growth consider referral to nephrology or endocrinology for further evaluation.

## 2021-08-05 NOTE — Progress Notes (Signed)
BP 109/73    Pulse 93    Temp 99.7 F (37.6 C) (Oral)    Wt 290 lb (131.5 kg)    SpO2 95%    BMI 42.83 kg/m    Subjective:    Patient ID: Erin Contreras, female    DOB: 1961/04/23, 61 y.o.   MRN: [id number]  HPI: Erin Contreras is a 61 y.o. female  Chief Complaint  Patient presents with   Overactive Bladder    4 week f/up- pt states she is still having issues with this    URINARY SYMPTOMS Was seen on 06/30/21 for this, was recommended to perform lifestyle changes at the time -- Kegel exercises.  UA and wet prep at recent visit were overall stable.  At present gets up about 3 times a night to urinate, which has an odor + she feels wet.  Her naval gets most and wet as well.  She is a G 5 P 3 -- vaginal births.  At times does drink a lot of fluids before bed -- water, sodas, and juice.  At times endorses polydipsia.  Continues to follow with pain management and rheumatology for RA and chronic pain -- continues on Lyrica, Methotrexate, Duloxetine, Celebrex, and Orencia.  History of adrenal mass noted on imaging 04/21/20 measuring 3.5 cm to right side -- previous 2.8 cm in 2007.  Not currently sexually active -- been years. Dysuria:  occasional itch, no burning Urinary frequency:  more at night Urgency: no Small volume voids: no Symptom severity: no Urinary incontinence: yes Foul odor: yes Hematuria: no Abdominal pain: no Back pain:  occasional Suprapubic pain/pressure: no Flank pain: no Fever: none  Relevant past medical, surgical, family and social history reviewed and updated as indicated. Interim medical history since our last visit reviewed. Allergies and medications reviewed and updated.  Review of Systems  Constitutional:  Negative for activity change, appetite change, diaphoresis, fatigue and unexpected weight change.  Respiratory: Negative.    Cardiovascular: Negative.   Gastrointestinal: Negative.   Genitourinary:  Positive for frequency and urgency. Negative for  decreased urine volume, dysuria, hematuria, pelvic pain and vaginal discharge.  Musculoskeletal:  Positive for back pain.  Neurological: Negative.   Psychiatric/Behavioral: Negative.     Per HPI unless specifically indicated above     Objective:    BP 109/73    Pulse 93    Temp 99.7 F (37.6 C) (Oral)    Wt 290 lb (131.5 kg)    SpO2 95%    BMI 42.83 kg/m   Wt Readings from Last 3 Encounters:  08/05/21 290 lb (131.5 kg)  06/30/21 288 lb (130.6 kg)  05/18/21 285 lb (129.3 kg)    Physical Exam Vitals and nursing note reviewed.  Constitutional:      General: She is awake. She is not in acute distress.    Appearance: She is well-developed and well-groomed. She is obese. She is not ill-appearing or toxic-appearing.  HENT:     Head: Normocephalic.     Right Ear: Hearing, ear canal and external ear normal.     Left Ear: Hearing, ear canal and external ear normal.  Eyes:     General: Lids are normal.        Right eye: No discharge.        Left eye: No discharge.     Conjunctiva/sclera: Conjunctivae normal.     Pupils: Pupils are equal, round, and reactive to light.  Neck:     Thyroid: No  thyromegaly.     Vascular: No carotid bruit.  Cardiovascular:     Rate and Rhythm: Normal rate and regular rhythm.     Heart sounds: Normal heart sounds. No murmur heard.   No gallop.  Pulmonary:     Effort: Pulmonary effort is normal. No accessory muscle usage or respiratory distress.     Breath sounds: Normal breath sounds.  Abdominal:     General: Bowel sounds are normal. There is no distension.     Palpations: Abdomen is soft.     Tenderness: There is no abdominal tenderness. There is no right CVA tenderness or left CVA tenderness.  Musculoskeletal:     Cervical back: Normal range of motion and neck supple.     Right lower leg: No edema.     Left lower leg: No edema.  Skin:    General: Skin is warm and dry.       Neurological:     Mental Status: She is alert and oriented to person,  place, and time.     Deep Tendon Reflexes:     Reflex Scores:      Brachioradialis reflexes are 1+ on the right side and 1+ on the left side.      Patellar reflexes are 1+ on the right side and 1+ on the left side. Psychiatric:        Attention and Perception: Attention normal.        Mood and Affect: Mood normal.        Speech: Speech normal.        Behavior: Behavior normal. Behavior is cooperative.        Thought Content: Thought content normal.   Results for orders placed or performed in visit on 08/05/21  Microscopic Examination  Result Value Ref Range   WBC, UA 0-5 0 - 5 /hpf   RBC None seen 0 - 2 /hpf   Epithelial Cells (non renal) 0-10 0 - 10 /hpf   Mucus, UA Present (A) Not Estab.   Bacteria, UA Few (A) None seen/Few  WET PREP FOR TRICH, YEAST, CLUE   Urine  Result Value Ref Range   Trichomonas Exam Negative Negative   Yeast Exam Negative Negative   Clue Cell Exam Negative Negative  Urinalysis, Routine w reflex microscopic  Result Value Ref Range   Specific Gravity, UA 1.025 1.005 - 1.030   pH, UA 6.0 5.0 - 7.5   Color, UA Yellow Yellow   Appearance Ur Clear Clear   Leukocytes,UA Trace (A) Negative   Protein,UA Negative Negative/Trace   Glucose, UA Negative Negative   Ketones, UA Negative Negative   RBC, UA Negative Negative   Bilirubin, UA Negative Negative   Urobilinogen, Ur 0.2 0.2 - 1.0 mg/dL   Nitrite, UA Negative Negative   Microscopic Examination See below:       Assessment & Plan:   Problem List Items Addressed This Visit       Endocrine   Adrenal adenoma    Noted on past imaging -- slight increase in size on recent imaging 04/21/20.  Plan on repeat imaging upcoming months and if ongoing growth consider referral to nephrology or endocrinology for further evaluation.        Musculoskeletal and Integument   Rheumatoid arthritis involving multiple sites with positive rheumatoid factor (HCC) - Primary    Chronic, ongoing, followed by rheumatology.   Consider current treatment regimen as ordered by them.  Recent notes reviewed.  Other   Chronic pain syndrome    Chronic, ongoing, followed by pain management.  Continue this collaboration and current treatment regimen as ordered by them.  Recent notes reviewed.      Prediabetes    A1c 5.6% last visit, recheck today due to urinary frequency.  Continue diet monitoring and initiate medication as needed.      Relevant Orders   CBC with Differential/Platelet   Comprehensive metabolic panel   TSH   HgB A1c   Urge and stress incontinence    Chronic, ongoing with symptoms more at night.  Suspect some pelvic floor dysfunction.  Would benefit from modest weight loss.  At this time recommend reduction in fluid intake 2-3 hours prior to bedtime.  Referral for pelvic floor therapy with PT.  Will consider addition of medication in future, such as Ditropan or Myrbetriq.  Return in 6 weeks.  Labs today A1c, TSH, CBC, CMP.      Relevant Orders   Ambulatory referral to Physical Therapy   Other Visit Diagnoses     Urinary tract infection symptoms       UA and wet prep remain stable today.   Relevant Orders   Urinalysis, Routine w reflex microscopic (Completed)   Urine Culture   Microscopic Examination (Completed)   WET PREP FOR TRICH, YEAST, CLUE (Completed)   Tinea corporis       To umbilicus.  Nystatin powder sent and instructed on proper hygiene and care.   Relevant Medications   nystatin (MYCOSTATIN/NYSTOP) powder        Follow up plan: Return in about 6 weeks (around 09/17/2021) for Overactive Bladder.

## 2021-08-06 DIAGNOSIS — U071 COVID-19: Secondary | ICD-10-CM | POA: Diagnosis not present

## 2021-08-06 DIAGNOSIS — Z1152 Encounter for screening for COVID-19: Secondary | ICD-10-CM | POA: Diagnosis not present

## 2021-08-06 LAB — COMPREHENSIVE METABOLIC PANEL
ALT: 16 IU/L (ref 0–32)
AST: 16 IU/L (ref 0–40)
Albumin/Globulin Ratio: 1.6 (ref 1.2–2.2)
Albumin: 4.2 g/dL (ref 3.8–4.8)
Alkaline Phosphatase: 122 IU/L — ABNORMAL HIGH (ref 44–121)
BUN/Creatinine Ratio: 17 (ref 12–28)
BUN: 12 mg/dL (ref 8–27)
Bilirubin Total: 0.4 mg/dL (ref 0.0–1.2)
CO2: 26 mmol/L (ref 20–29)
Calcium: 9.1 mg/dL (ref 8.7–10.3)
Chloride: 101 mmol/L (ref 96–106)
Creatinine, Ser: 0.71 mg/dL (ref 0.57–1.00)
Globulin, Total: 2.6 g/dL (ref 1.5–4.5)
Glucose: 88 mg/dL (ref 70–99)
Potassium: 4.4 mmol/L (ref 3.5–5.2)
Sodium: 139 mmol/L (ref 134–144)
Total Protein: 6.8 g/dL (ref 6.0–8.5)
eGFR: 97 mL/min/{1.73_m2} (ref >59)

## 2021-08-06 LAB — CBC WITH DIFFERENTIAL/PLATELET
Basophils Absolute: 0 10*3/uL (ref 0.0–0.2)
Basos: 1 %
EOS (ABSOLUTE): 0.2 10*3/uL (ref 0.0–0.4)
Eos: 3 %
Hematocrit: 39.6 % (ref 34.0–46.6)
Hemoglobin: 13.2 g/dL (ref 11.1–15.9)
Immature Grans (Abs): 0 10*3/uL (ref 0.0–0.1)
Immature Granulocytes: 0 %
Lymphocytes Absolute: 0.4 10*3/uL — ABNORMAL LOW (ref 0.7–3.1)
Lymphs: 7 %
MCH: 28.4 pg (ref 26.6–33.0)
MCHC: 33.3 g/dL (ref 31.5–35.7)
MCV: 85 fL (ref 79–97)
Monocytes Absolute: 0.7 10*3/uL (ref 0.1–0.9)
Monocytes: 11 %
Neutrophils Absolute: 4.9 10*3/uL (ref 1.4–7.0)
Neutrophils: 78 %
Platelets: 271 10*3/uL (ref 150–450)
RBC: 4.64 x10E6/uL (ref 3.77–5.28)
RDW: 15.2 % (ref 11.7–15.4)
WBC: 6.3 10*3/uL (ref 3.4–10.8)

## 2021-08-06 LAB — HEMOGLOBIN A1C
Est. average glucose Bld gHb Est-mCnc: 126 mg/dL
Hgb A1c MFr Bld: 6 % — ABNORMAL HIGH (ref 4.8–5.6)

## 2021-08-06 LAB — TSH: TSH: 0.265 u[IU]/mL — ABNORMAL LOW (ref 0.450–4.500)

## 2021-08-06 NOTE — Progress Notes (Signed)
Good morning crew, please let Erin Contreras know her labs have returned: - CBC is stable with no anemia or infection - Kidney and liver function are normal. - A1c is at 6%, this has crept up a little from previous of 5.6% and is showing prediabetes level again.  Focus heavily on cutting back on sugar and carbohydrate rich foods + try to get regular walks in during week. - Thyroid level was on lower side, showing thyroid may be a little over active.  I will recheck this at next visit in 4 weeks, if still on low side we will get some images of your thyroid to further assess.  This could be cause of current issues as well.  Any questions? Keep being amazing!!  Thank you for allowing me to participate in your care.  I appreciate you. Kindest regards, Valla Pacey

## 2021-08-07 LAB — URINE CULTURE

## 2021-08-08 ENCOUNTER — Other Ambulatory Visit: Payer: Self-pay | Admitting: Student in an Organized Health Care Education/Training Program

## 2021-08-08 DIAGNOSIS — G8929 Other chronic pain: Secondary | ICD-10-CM

## 2021-08-08 DIAGNOSIS — G894 Chronic pain syndrome: Secondary | ICD-10-CM

## 2021-08-08 DIAGNOSIS — M792 Neuralgia and neuritis, unspecified: Secondary | ICD-10-CM

## 2021-08-08 DIAGNOSIS — M5416 Radiculopathy, lumbar region: Secondary | ICD-10-CM

## 2021-08-14 ENCOUNTER — Encounter: Payer: Self-pay | Admitting: Nurse Practitioner

## 2021-08-14 ENCOUNTER — Other Ambulatory Visit: Payer: Self-pay | Admitting: Nurse Practitioner

## 2021-08-14 ENCOUNTER — Ambulatory Visit: Payer: BC Managed Care – PPO | Admitting: Nurse Practitioner

## 2021-08-14 ENCOUNTER — Other Ambulatory Visit: Payer: Self-pay

## 2021-08-14 VITALS — BP 105/72 | HR 80 | Temp 98.1°F | Wt 291.4 lb

## 2021-08-14 DIAGNOSIS — Z1231 Encounter for screening mammogram for malignant neoplasm of breast: Secondary | ICD-10-CM

## 2021-08-14 DIAGNOSIS — G894 Chronic pain syndrome: Secondary | ICD-10-CM | POA: Diagnosis not present

## 2021-08-14 DIAGNOSIS — M25511 Pain in right shoulder: Secondary | ICD-10-CM | POA: Diagnosis not present

## 2021-08-14 MED ORDER — CELECOXIB 200 MG PO CAPS: 200.0000 mg | ORAL_CAPSULE | Freq: Every day | ORAL | 4 refills | Status: DC

## 2021-08-14 MED ORDER — LIDOCAINE 5 % EX PTCH: 1.0000 | MEDICATED_PATCH | CUTANEOUS | 0 refills | Status: DC

## 2021-08-14 NOTE — Assessment & Plan Note (Signed)
Acute for 2 days with decreased ROM and strength + pain radiating into lateral aspect hand.  ?impingment vs rotator cuff issues.  At this time refills on Celecoxib sent.  Will send in Lidocaine patches to use to posterior shoulder as needed, educated patient on these.  Recommend rest and heat application over weekend + may use Voltaren gel as needed & Tylenol PRN.  Referral to ortho for further assessment due to reduction in ROM.

## 2021-08-14 NOTE — Progress Notes (Signed)
BP 105/72    Pulse 80    Temp 98.1 F (36.7 C)    Wt 291 lb 6.4 oz (132.2 kg)    SpO2 98%    BMI 43.03 kg/m    Subjective:    Patient ID: Erin Contreras, female    DOB: Jul 13, 1961, 61 y.o.   MRN: [id number]  HPI: Erin Contreras is a 61 y.o. female  Chief Complaint  Patient presents with   Pain    Patient complains of right hand pain and arm pain    RHEUMATOID ARTHRITIS WITH ACUTE RIGHT HAND PAIN Followed by rheumatology and last saw 07/24/21, receiving infusions once a month -- last 07/31/21.  Also followed by pain management with last visit 06/22/21.  Presents today for right hand pain which started two days ago -- to dorsal aspect of hand into 3rd to 5th finger and up into shoulder, has decreased mobility of shoulder reported and discomfort.  Is right hand dominant.  No recent falls or injuries. Duration: chronic with acute hand/shoulder pain Pain: yes Symmetric: yes  8/10 Quality: sharp and aching Frequency: constant Context:  fluctuating Decreased function/range of motion: yes Erythema: none Swelling: yes Heat or warmth: none Morning stiffness: yes Aggravating factors: worse at night, moving Alleviating factors: resting Relief with NSAIDs?: No NSAIDs Taken Treatments attempted:  rest and APAP  Involved Joints:     Hands: yes right    Wrists: yes right     Elbows: yes right    Shoulders: yes right  Relevant past medical, surgical, family and social history reviewed and updated as indicated. Interim medical history since our last visit reviewed. Allergies and medications reviewed and updated.  Review of Systems  Constitutional:  Negative for activity change, appetite change, diaphoresis, fatigue and unexpected weight change.  Respiratory: Negative.    Cardiovascular: Negative.   Gastrointestinal: Negative.   Musculoskeletal:  Positive for arthralgias.  Neurological: Negative.   Psychiatric/Behavioral: Negative.     Per HPI unless specifically indicated  above     Objective:    BP 105/72    Pulse 80    Temp 98.1 F (36.7 C)    Wt 291 lb 6.4 oz (132.2 kg)    SpO2 98%    BMI 43.03 kg/m   Wt Readings from Last 3 Encounters:  08/14/21 291 lb 6.4 oz (132.2 kg)  08/05/21 290 lb (131.5 kg)  06/30/21 288 lb (130.6 kg)    Physical Exam Vitals and nursing note reviewed.  Constitutional:      General: She is awake. She is not in acute distress.    Appearance: She is well-developed and well-groomed. She is obese. She is not ill-appearing.  HENT:     Head: Normocephalic.     Right Ear: Hearing normal.     Left Ear: Hearing normal.  Eyes:     General: Lids are normal.        Right eye: No discharge.        Left eye: No discharge.     Conjunctiva/sclera: Conjunctivae normal.     Pupils: Pupils are equal, round, and reactive to light.  Neck:     Vascular: No carotid bruit.  Cardiovascular:     Rate and Rhythm: Normal rate and regular rhythm.     Heart sounds: Normal heart sounds. No murmur heard.   No gallop.  Pulmonary:     Effort: Pulmonary effort is normal. No accessory muscle usage or respiratory distress.     Breath sounds:  Normal breath sounds.  Abdominal:     General: Bowel sounds are normal. There is no distension.     Palpations: Abdomen is soft.     Tenderness: There is no abdominal tenderness.  Musculoskeletal:     Right shoulder: Tenderness present. No swelling, laceration, bony tenderness or crepitus. Decreased range of motion. Decreased strength. Normal pulse.     Left shoulder: Normal.     Right hand: Swelling (mild to lateral aspect) and tenderness (mild to lateral, dorsal aspect) present. No bony tenderness. Normal range of motion. Normal strength. Normal sensation. Normal capillary refill. Normal pulse.     Left hand: Normal.     Cervical back: Normal range of motion and neck supple.     Right lower leg: No edema.     Left lower leg: No edema.     Comments: Decreased ROM right shoulder -- decrease flexion and  extension + decreased strength noted.  Contreras and Hawkins with discomfort noted.  Skin:    General: Skin is warm and dry.  Neurological:     Mental Status: She is alert and oriented to person, place, and time.  Psychiatric:        Attention and Perception: Attention normal.        Mood and Affect: Mood normal.        Speech: Speech normal.        Behavior: Behavior normal. Behavior is cooperative.        Thought Content: Thought content normal.   Results for orders placed or performed in visit on 08/05/21  Urine Culture   Specimen: Urine   UR  Result Value Ref Range   Urine Culture, Routine Final report    Organism ID, Bacteria Comment   Microscopic Examination  Result Value Ref Range   WBC, UA 0-5 0 - 5 /hpf   RBC None seen 0 - 2 /hpf   Epithelial Cells (non renal) 0-10 0 - 10 /hpf   Mucus, UA Present (A) Not Estab.   Bacteria, UA Few (A) None seen/Few  WET PREP FOR TRICH, YEAST, CLUE   Urine  Result Value Ref Range   Trichomonas Exam Negative Negative   Yeast Exam Negative Negative   Clue Cell Exam Negative Negative  Urinalysis, Routine w reflex microscopic  Result Value Ref Range   Specific Gravity, UA 1.025 1.005 - 1.030   pH, UA 6.0 5.0 - 7.5   Color, UA Yellow Yellow   Appearance Ur Clear Clear   Leukocytes,UA Trace (A) Negative   Protein,UA Negative Negative/Trace   Glucose, UA Negative Negative   Ketones, UA Negative Negative   RBC, UA Negative Negative   Bilirubin, UA Negative Negative   Urobilinogen, Ur 0.2 0.2 - 1.0 mg/dL   Nitrite, UA Negative Negative   Microscopic Examination See below:   CBC with Differential/Platelet  Result Value Ref Range   WBC 6.3 3.4 - 10.8 x10E3/uL   RBC 4.64 3.77 - 5.28 x10E6/uL   Hemoglobin 13.2 11.1 - 15.9 g/dL   Hematocrit 39.6 34.0 - 46.6 %   MCV 85 79 - 97 fL   MCH 28.4 26.6 - 33.0 pg   MCHC 33.3 31.5 - 35.7 g/dL   RDW 15.2 11.7 - 15.4 %   Platelets 271 150 - 450 x10E3/uL   Neutrophils 78 Not Estab. %   Lymphs 7 Not  Estab. %   Monocytes 11 Not Estab. %   Eos 3 Not Estab. %   Basos 1 Not Estab. %  Neutrophils Absolute 4.9 1.4 - 7.0 x10E3/uL   Lymphocytes Absolute 0.4 (L) 0.7 - 3.1 x10E3/uL   Monocytes Absolute 0.7 0.1 - 0.9 x10E3/uL   EOS (ABSOLUTE) 0.2 0.0 - 0.4 x10E3/uL   Basophils Absolute 0.0 0.0 - 0.2 x10E3/uL   Immature Granulocytes 0 Not Estab. %   Immature Grans (Abs) 0.0 0.0 - 0.1 x10E3/uL  Comprehensive metabolic panel  Result Value Ref Range   Glucose 88 70 - 99 mg/dL   BUN 12 8 - 27 mg/dL   Creatinine, Ser 0.71 0.57 - 1.00 mg/dL   eGFR 97 >59 mL/min/1.73   BUN/Creatinine Ratio 17 12 - 28   Sodium 139 134 - 144 mmol/L   Potassium 4.4 3.5 - 5.2 mmol/L   Chloride 101 96 - 106 mmol/L   CO2 26 20 - 29 mmol/L   Calcium 9.1 8.7 - 10.3 mg/dL   Total Protein 6.8 6.0 - 8.5 g/dL   Albumin 4.2 3.8 - 4.8 g/dL   Globulin, Total 2.6 1.5 - 4.5 g/dL   Albumin/Globulin Ratio 1.6 1.2 - 2.2   Bilirubin Total 0.4 0.0 - 1.2 mg/dL   Alkaline Phosphatase 122 (H) 44 - 121 IU/L   AST 16 0 - 40 IU/L   ALT 16 0 - 32 IU/L  TSH  Result Value Ref Range   TSH 0.265 (L) 0.450 - 4.500 uIU/mL  HgB A1c  Result Value Ref Range   Hgb A1c MFr Bld 6.0 (H) 4.8 - 5.6 %   Est. average glucose Bld gHb Est-mCnc 126 mg/dL      Assessment & Plan:   Problem List Items Addressed This Visit       Other   Acute pain of right shoulder - Primary    Acute for 2 days with decreased ROM and strength + pain radiating into lateral aspect hand.  ?impingment vs rotator cuff issues.  At this time refills on Celecoxib sent.  Will send in Lidocaine patches to use to posterior shoulder as needed, educated patient on these.  Recommend rest and heat application over weekend + may use Voltaren gel as needed & Tylenol PRN.  Referral to ortho for further assessment due to reduction in ROM.      Relevant Orders   Uric acid   Comp Met (CMET)   CBC w/Diff   Ambulatory referral to Orthopedics   Chronic pain syndrome    Chronic,  ongoing, followed by pain management.  Continue this collaboration and current treatment regimen as ordered by them.  Recent notes reviewed.  Refill on Celecoxib sent.      Relevant Medications   celecoxib (CELEBREX) 200 MG capsule     Follow up plan: Return for as scheduled in February.

## 2021-08-14 NOTE — Assessment & Plan Note (Signed)
Chronic, ongoing, followed by pain management.  Continue this collaboration and current treatment regimen as ordered by them.  Recent notes reviewed.  Refill on Celecoxib sent.

## 2021-08-14 NOTE — Patient Instructions (Signed)
Shoulder Pain Many things can cause shoulder pain, including: An injury. Moving the shoulder in the same way again and again (overuse). Joint pain (arthritis). Pain can come from: Swelling and irritation (inflammation) of any part of the shoulder. An injury to the shoulder joint. An injury to: Tissues that connect muscle to bone (tendons). Tissues that connect bones to each other (ligaments). Bones. Follow these instructions at home: Watch for changes in your symptoms. Let your doctor know about them. Follow these instructions to help with your pain. If you have a sling: Wear the sling as told by your doctor. Remove it only as told by your doctor. Loosen the sling if your fingers: Tingle. Become numb. Turn cold and blue. Keep the sling clean. If the sling is not waterproof: Do not let it get wet. Take the sling off when you shower or bathe. Managing pain, stiffness, and swelling  If told, put ice on the painful area: Put ice in a plastic bag. Place a towel between your skin and the bag. Leave the ice on for 20 minutes, 2-3 times a day. Stop putting ice on if it does not help with the pain. Squeeze a soft ball or a foam pad as much as possible. This prevents swelling in the shoulder. It also helps to strengthen the arm. General instructions Take over-the-counter and prescription medicines only as told by your doctor. Keep all follow-up visits as told by your doctor. This is important. Contact a doctor if: Your pain gets worse. Medicine does not help your pain. You have new pain in your arm, hand, or fingers. Get help right away if: Your arm, hand, or fingers: Tingle. Are numb. Are swollen. Are painful. Turn white or blue. Summary Shoulder pain can be caused by many things. These include injury, moving the shoulder in the same away again and again, and joint pain. Watch for changes in your symptoms. Let your doctor know about them. This condition may be treated with a  sling, ice, and pain medicine. Contact your doctor if the pain gets worse or you have new pain. Get help right away if your arm, hand, or fingers tingle or get numb, swollen, or painful. Keep all follow-up visits as told by your doctor. This is important. This information is not intended to replace advice given to you by your health care provider. Make sure you discuss any questions you have with your health care provider. Document Revised: 04/03/2021 Document Reviewed: 04/03/2021 Elsevier Patient Education  2022 [employer].

## 2021-08-15 LAB — CBC WITH DIFFERENTIAL/PLATELET
Basophils Absolute: 0 10*3/uL (ref 0.0–0.2)
Basos: 1 %
EOS (ABSOLUTE): 0.3 10*3/uL (ref 0.0–0.4)
Eos: 5 %
Hematocrit: 37.6 % (ref 34.0–46.6)
Hemoglobin: 12.6 g/dL (ref 11.1–15.9)
Immature Grans (Abs): 0 10*3/uL (ref 0.0–0.1)
Immature Granulocytes: 0 %
Lymphocytes Absolute: 1.6 10*3/uL (ref 0.7–3.1)
Lymphs: 26 %
MCH: 28.9 pg (ref 26.6–33.0)
MCHC: 33.5 g/dL (ref 31.5–35.7)
MCV: 86 fL (ref 79–97)
Monocytes Absolute: 0.4 10*3/uL (ref 0.1–0.9)
Monocytes: 6 %
Neutrophils Absolute: 3.7 10*3/uL (ref 1.4–7.0)
Neutrophils: 62 %
Platelets: 296 10*3/uL (ref 150–450)
RBC: 4.36 x10E6/uL (ref 3.77–5.28)
RDW: 14.9 % (ref 11.7–15.4)
WBC: 6.1 10*3/uL (ref 3.4–10.8)

## 2021-08-15 LAB — COMPREHENSIVE METABOLIC PANEL
ALT: 15 IU/L (ref 0–32)
AST: 18 IU/L (ref 0–40)
Albumin/Globulin Ratio: 1.2 (ref 1.2–2.2)
Albumin: 3.7 g/dL — ABNORMAL LOW (ref 3.8–4.8)
Alkaline Phosphatase: 104 IU/L (ref 44–121)
BUN/Creatinine Ratio: 17 (ref 12–28)
BUN: 14 mg/dL (ref 8–27)
Bilirubin Total: 0.4 mg/dL (ref 0.0–1.2)
CO2: 26 mmol/L (ref 20–29)
Calcium: 9 mg/dL (ref 8.7–10.3)
Chloride: 105 mmol/L (ref 96–106)
Creatinine, Ser: 0.82 mg/dL (ref 0.57–1.00)
Globulin, Total: 3 g/dL (ref 1.5–4.5)
Glucose: 111 mg/dL — ABNORMAL HIGH (ref 70–99)
Potassium: 4 mmol/L (ref 3.5–5.2)
Sodium: 143 mmol/L (ref 134–144)
Total Protein: 6.7 g/dL (ref 6.0–8.5)
eGFR: 81 mL/min/{1.73_m2} (ref >59)

## 2021-08-15 LAB — URIC ACID: Uric Acid: 4.7 mg/dL (ref 3.0–7.2)

## 2021-08-15 NOTE — Progress Notes (Signed)
Good morning crew, please let Erin Contreras know that overall her labs have returned and are stable with no findings concerning for gout or other acute inflammatory process.  I recommend she continue with current plan and schedule with ortho once they call her.  Any questions? Keep being awesome!!  Thank you for allowing me to participate in your care.  I appreciate you. Kindest regards, Mounir Skipper

## 2021-08-19 ENCOUNTER — Telehealth: Payer: Self-pay

## 2021-08-19 NOTE — Telephone Encounter (Signed)
Prior Authorization initiated via CoverMyMeds for prescriptions for Lidocaine 5% Patches. Awaiting determination from insurance.  KEY: [id number]   Prior Authorization initiated via CoverMyMeds for prescriptions for Celecoxib 200 MG. Awaiting determination from insurance.  KEY: [id number]

## 2021-08-25 DIAGNOSIS — Z0189 Encounter for other specified special examinations: Secondary | ICD-10-CM | POA: Diagnosis not present

## 2021-09-02 ENCOUNTER — Ambulatory Visit: Payer: BC Managed Care – PPO

## 2021-09-02 DIAGNOSIS — M0579 Rheumatoid arthritis with rheumatoid factor of multiple sites without organ or systems involvement: Secondary | ICD-10-CM | POA: Diagnosis not present

## 2021-09-16 ENCOUNTER — Encounter: Payer: Self-pay | Admitting: Nurse Practitioner

## 2021-09-16 ENCOUNTER — Ambulatory Visit (INDEPENDENT_AMBULATORY_CARE_PROVIDER_SITE_OTHER): Payer: BC Managed Care – PPO | Admitting: Nurse Practitioner

## 2021-09-16 ENCOUNTER — Other Ambulatory Visit: Payer: Self-pay

## 2021-09-16 VITALS — BP 119/87 | HR 73 | Temp 98.0°F | Ht 69.02 in | Wt 293.8 lb

## 2021-09-16 DIAGNOSIS — N3946 Mixed incontinence: Secondary | ICD-10-CM

## 2021-09-16 DIAGNOSIS — R7989 Other specified abnormal findings of blood chemistry: Secondary | ICD-10-CM | POA: Insufficient documentation

## 2021-09-16 DIAGNOSIS — N898 Other specified noninflammatory disorders of vagina: Secondary | ICD-10-CM

## 2021-09-16 DIAGNOSIS — Z6841 Body Mass Index (BMI) 40.0 and over, adult: Secondary | ICD-10-CM

## 2021-09-16 DIAGNOSIS — R7303 Prediabetes: Secondary | ICD-10-CM

## 2021-09-16 DIAGNOSIS — Z1159 Encounter for screening for other viral diseases: Secondary | ICD-10-CM

## 2021-09-16 DIAGNOSIS — E538 Deficiency of other specified B group vitamins: Secondary | ICD-10-CM

## 2021-09-16 DIAGNOSIS — D3501 Benign neoplasm of right adrenal gland: Secondary | ICD-10-CM

## 2021-09-16 DIAGNOSIS — E559 Vitamin D deficiency, unspecified: Secondary | ICD-10-CM

## 2021-09-16 LAB — URINALYSIS, ROUTINE W REFLEX MICROSCOPIC
Bilirubin, UA: NEGATIVE
Glucose, UA: NEGATIVE
Ketones, UA: NEGATIVE
Nitrite, UA: NEGATIVE
Protein,UA: NEGATIVE
RBC, UA: NEGATIVE
Specific Gravity, UA: >1.03 — ABNORMAL HIGH (ref 1.005–1.030)
Urobilinogen, Ur: 0.2 mg/dL (ref 0.2–1.0)
pH, UA: 5.5 (ref 5.0–7.5)

## 2021-09-16 LAB — MICROSCOPIC EXAMINATION
Bacteria, UA: NONE SEEN
RBC, Urine: NONE SEEN /hpf (ref 0–2)

## 2021-09-16 LAB — WET PREP FOR TRICH, YEAST, CLUE
Clue Cell Exam: NEGATIVE
Trichomonas Exam: NEGATIVE
Yeast Exam: NEGATIVE

## 2021-09-16 MED ORDER — ACYCLOVIR 400 MG PO TABS: 400.0000 mg | ORAL_TABLET | Freq: Two times a day (BID) | ORAL | 4 refills | Status: DC | PRN

## 2021-09-16 NOTE — Assessment & Plan Note (Signed)
Noted on work labs 08/31/21 = level 247.  At this time recommend she start supplement daily 1000 MCG daily.  Educated her on this.  Possible underlying cause for her fatigue.

## 2021-09-16 NOTE — Assessment & Plan Note (Signed)
Ongoing, recent work labs noted level 19.2.  Continue supplement and recommend ensure she is taking 2000 units daily.  Recheck in upcoming months.

## 2021-09-16 NOTE — Progress Notes (Signed)
BP 119/87    Pulse 73    Temp 98 F (36.7 C) (Oral)    Ht 5' 9.02" (1.753 m)    Wt 293 lb 12.8 oz (133.3 kg)    SpO2 94%    BMI 43.37 kg/m    Subjective:    Patient ID: Erin Contreras, female    DOB: 08-May-1961, 61 y.o.   MRN: [id number]  HPI: Erin Contreras is a 61 y.o. female  Chief Complaint  Patient presents with   Overactive bladder   Vaginal Discharge    Vaginal itching, and vaginal odor   URINARY SYMPTOMS Was seen on 06/30/21 & 08/05/21 for this, was recommended to perform lifestyle changes at the time -- Kegel exercises + referral to PT placed, but she did not hear from them (appears they did try to call but she was getting new phone).  UA and wet prep at recent visit were overall stable.   At present gets up about 2-3 times a night to urinate, which has an odor + she feels wet.  She is a G 5 P 3 -- vaginal births.  At times does drink a lot of fluids before bed -- water, sodas, and juice, is cutting back on this.  Recent A1c via workplace on 09/01/21 was 6.1%.  At times endorses polydipsia.  History of adrenal mass noted on imaging 04/21/20 measuring 3.5 cm to right side -- previous 2.8 cm in 2007.   Not currently sexually active -- been years.  Labs via work noted Vit D level 19.2 and B12 247 -- she is not taking supplements. Dysuria:  occasional itch, no burning Urinary frequency:  more at night Urgency: no Small volume voids: no Symptom severity: no Urinary incontinence: yes Foul odor: yes Hematuria: no Abdominal pain: no Back pain:  occasional Suprapubic pain/pressure: no Flank pain: no Fever: none  HYPERTHYROIDISM Recent labs noted TSH 0.265. Fatigue: yes Cold intolerance: yes Heat intolerance: no Weight gain: no Weight loss: no Constipation: yes Diarrhea/loose stools: yes Palpitations: no Lower extremity edema: no Anxiety/depressed mood:  occasional     Relevant past medical, surgical, family and social history reviewed and updated as indicated. Interim  medical history since our last visit reviewed. Allergies and medications reviewed and updated.  Review of Systems  Constitutional:  Positive for fatigue. Negative for activity change, appetite change, diaphoresis and unexpected weight change.  Respiratory: Negative.    Cardiovascular: Negative.   Gastrointestinal: Negative.   Genitourinary:  Positive for frequency and urgency. Negative for decreased urine volume, dysuria, hematuria, pelvic pain and vaginal discharge.  Musculoskeletal:  Negative for back pain.  Neurological: Negative.   Psychiatric/Behavioral: Negative.     Per HPI unless specifically indicated above     Objective:    BP 119/87    Pulse 73    Temp 98 F (36.7 C) (Oral)    Ht 5' 9.02" (1.753 m)    Wt 293 lb 12.8 oz (133.3 kg)    SpO2 94%    BMI 43.37 kg/m   Wt Readings from Last 3 Encounters:  09/16/21 293 lb 12.8 oz (133.3 kg)  08/14/21 291 lb 6.4 oz (132.2 kg)  08/05/21 290 lb (131.5 kg)    Physical Exam Vitals and nursing note reviewed.  Constitutional:      General: She is awake. She is not in acute distress.    Appearance: She is well-developed and well-groomed. She is obese. She is not ill-appearing.  HENT:     Head:  Normocephalic.     Right Ear: Hearing normal.     Left Ear: Hearing normal.  Eyes:     General: Lids are normal.        Right eye: No discharge.        Left eye: No discharge.     Conjunctiva/sclera: Conjunctivae normal.     Pupils: Pupils are equal, round, and reactive to light.  Neck:     Vascular: No carotid bruit.  Cardiovascular:     Rate and Rhythm: Normal rate and regular rhythm.     Heart sounds: Normal heart sounds. No murmur heard.   No gallop.  Pulmonary:     Effort: Pulmonary effort is normal. No accessory muscle usage or respiratory distress.     Breath sounds: Normal breath sounds.  Abdominal:     General: Bowel sounds are normal. There is no distension.     Palpations: Abdomen is soft.     Tenderness: There is no  abdominal tenderness.  Musculoskeletal:     Cervical back: Normal range of motion and neck supple.     Right lower leg: No edema.     Left lower leg: No edema.  Skin:    General: Skin is warm and dry.  Neurological:     Mental Status: She is alert and oriented to person, place, and time.  Psychiatric:        Attention and Perception: Attention normal.        Mood and Affect: Mood normal.        Speech: Speech normal.        Behavior: Behavior normal. Behavior is cooperative.        Thought Content: Thought content normal.   Results for orders placed or performed in visit on 09/16/21  Microscopic Examination  Result Value Ref Range   WBC, UA 0-5 0 - 5 /hpf   RBC None seen 0 - 2 /hpf   Epithelial Cells (non renal) 0-10 0 - 10 /hpf   Mucus, UA Present (A) Not Estab.   Bacteria, UA None seen None seen/Few  WET PREP FOR TRICH, YEAST, CLUE   Urine  Result Value Ref Range   Trichomonas Exam Negative Negative   Yeast Exam Negative Negative   Clue Cell Exam Negative Negative  Urinalysis, Routine w reflex microscopic  Result Value Ref Range   Specific Gravity, UA >1.030 (H) 1.005 - 1.030   pH, UA 5.5 5.0 - 7.5   Color, UA Yellow Yellow   Appearance Ur Cloudy (A) Clear   Leukocytes,UA 1+ (A) Negative   Protein,UA Negative Negative/Trace   Glucose, UA Negative Negative   Ketones, UA Negative Negative   RBC, UA Negative Negative   Bilirubin, UA Negative Negative   Urobilinogen, Ur 0.2 0.2 - 1.0 mg/dL   Nitrite, UA Negative Negative   Microscopic Examination See below:       Assessment & Plan:   Problem List Items Addressed This Visit       Endocrine   Adrenal adenoma    Noted on past imaging -- slight increase in size on recent imaging 04/21/20.  Plan on repeat imaging, ordered today, and if ongoing growth consider referral to nephrology or endocrinology for further evaluation.      Relevant Orders   US Renal     Other   Prediabetes (Chronic)    A1c 6.1% on labs  recently at work 08/31/21.  Continue diet monitoring and initiate medication as needed.  B12 deficiency    Noted on work labs 08/31/21 = level 247.  At this time recommend she start supplement daily 1000 MCG daily.  Educated her on this.  Possible underlying cause for her fatigue.      Low serum thyroid stimulating hormone (TSH)    Noted on recent labs, recheck TSH and Free T4 today -- consider endo referral if ongoing lows.      Relevant Orders   T4, free   TSH   Obesity - Primary    BMI 43.47.  Recommended eating smaller high protein, low fat meals more frequently and exercising 30 mins a day 5 times a week with a goal of 10-15lb weight loss in the next 3 months. Patient voiced their understanding and motivation to adhere to these recommendations.       Urge and stress incontinence    Chronic, ongoing with symptoms more at night.  UA negative today.  Suspect some pelvic floor dysfunction.  Would benefit from modest weight loss.  At this time recommend reduction in fluid intake 2-3 hours prior to bedtime.  Referral for pelvic floor therapy with PT -- highly recommend she schedule this ASAP.  Will consider addition of medication in future, such as Ditropan or Myrbetriq.  Return in 5 weeks.  Labs today: BMP.      Relevant Orders   Basic metabolic panel   Urinalysis, Routine w reflex microscopic (Completed)   Ambulatory referral to Physical Therapy   Vitamin D deficiency    Ongoing, recent work labs noted level 19.2.  Continue supplement and recommend ensure she is taking 2000 units daily.  Recheck in upcoming months.      Other Visit Diagnoses     Vaginal discharge       Wet prep negative on check today, possible atrophic vaginitis underlying, recommend Replens daily.   Relevant Orders   WET PREP FOR [place], YEAST, CLUE   Need for hepatitis C screening test       Hep C screening on labs today per guidelines, discussed with patient.   Relevant Orders   Hepatitis C antibody         Follow up plan: Return in about 5 weeks (around 10/21/2021) for OAB and B12 and Vit D checks.

## 2021-09-16 NOTE — Assessment & Plan Note (Signed)
Noted on recent labs, recheck TSH and Free T4 today -- consider endo referral if ongoing lows.

## 2021-09-16 NOTE — Assessment & Plan Note (Signed)
A1c 6.1% on labs recently at work 08/31/21.  Continue diet monitoring and initiate medication as needed.

## 2021-09-16 NOTE — Assessment & Plan Note (Signed)
BMI 43.47.  Recommended eating smaller high protein, low fat meals more frequently and exercising 30 mins a day 5 times a week with a goal of 10-15lb weight loss in the next 3 months. Patient voiced their understanding and motivation to adhere to these recommendations.

## 2021-09-16 NOTE — Assessment & Plan Note (Addendum)
Chronic, ongoing with symptoms more at night.  UA negative today.  Suspect some pelvic floor dysfunction.  Would benefit from modest weight loss.  At this time recommend reduction in fluid intake 2-3 hours prior to bedtime.  Referral for pelvic floor therapy with PT -- highly recommend she schedule this ASAP.  Will consider addition of medication in future, such as Ditropan or Myrbetriq.  Return in 5 weeks.  Labs today: BMP.

## 2021-09-16 NOTE — Patient Instructions (Addendum)
START TAKING VITAMIN D3 2000 UNITS DAILY + VITAMIN B12 1000 MCG DAILY!!  TRY REPLENS OVER THE COUNTER FOR VAGINAL MOISTURIZER.  Overactive Bladder, Adult Overactive bladder is a condition in which a person has a sudden and frequent need to urinate. A person might also leak urine if he or she cannot get to the bathroom fast enough (urinary incontinence). Sometimes, symptoms can interfere with work or social activities. What are the causes? Overactive bladder is associated with poor nerve signals between your bladder and your brain. Your bladder may get the signal to empty before it is full. You may also have very sensitive muscles that make your bladder squeeze too soon. This condition may also be caused by other factors, such as: Medical conditions: Urinary tract infection. Infection of nearby tissues. Prostate enlargement. Bladder stones, inflammation, or tumors. Diabetes. Muscle or nerve weakness, especially from these conditions: A spinal cord injury. Stroke. Multiple sclerosis. Parkinson's disease. Other causes: Surgery on the uterus or urethra. Drinking too much caffeine or alcohol. Certain medicines, especially those that eliminate extra fluid in the body (diuretics). Constipation. What increases the risk? You may be at greater risk for overactive bladder if you: Are an older adult. Smoke. Are going through menopause. Have prostate problems. Have a neurological disease, such as stroke, dementia, Parkinson's disease, or multiple sclerosis (MS). Eat or drink alcohol, spicy food, caffeine, and other things that irritate the bladder. Are overweight or obese. What are the signs or symptoms? Symptoms of this condition include a sudden, strong urge to urinate. Other symptoms include: Leaking urine. Urinating 8 or more times a day. Waking up to urinate 2 or more times overnight. How is this diagnosed? This condition may be diagnosed based on: Your symptoms and medical history. A  physical exam. Blood or urine tests to check for possible causes, such as infection. You may also need to see a health care provider who specializes in urinary tract problems. This is called a urologist. How is this treated? Treatment for overactive bladder depends on the cause of your condition and whether it is mild or severe. Treatment may include: Bladder training, such as: Learning to control the urge to urinate by following a schedule to urinate at regular intervals. Doing Kegel exercises to strengthen the pelvic floor muscles that support your bladder. Special devices, such as: Biofeedback. This uses sensors to help you become aware of your body's signals. Electrical stimulation. This uses electrodes placed inside the body (implanted) or outside the body. These electrodes send gentle pulses of electricity to strengthen the nerves or muscles that control the bladder. Women may use a plastic device, called a pessary, that fits into the vagina and supports the bladder. Medicines, such as: Antibiotics to treat bladder infection. Antispasmodics to stop the bladder from releasing urine at the wrong time. Tricyclic antidepressants to relax bladder muscles. Injections of botulinum toxin type A directly into the bladder tissue to relax bladder muscles. Surgery, such as: A device may be implanted to help manage the nerve signals that control urination. An electrode may be implanted to stimulate electrical signals in the bladder. A procedure may be done to change the shape of the bladder. This is done only in very severe cases. Follow these instructions at home: Eating and drinking  Make diet or lifestyle changes recommended by your health care provider. These may include: Drinking fluids throughout the day and not only with meals. Cutting down on caffeine or alcohol. Eating a healthy and balanced diet to prevent constipation. This  may include: Choosing foods that are high in fiber, such as  beans, whole grains, and fresh fruits and vegetables. Limiting foods that are high in fat and processed sugars, such as fried and sweet foods. Lifestyle  Lose weight if needed. Do not use any products that contain nicotine or tobacco. These include cigarettes, chewing tobacco, and vaping devices, such as e-cigarettes. If you need help quitting, ask your health care provider. General instructions Take over-the-counter and prescription medicines only as told by your health care provider. If you were prescribed an antibiotic medicine, take it as told by your health care provider. Do not stop taking the antibiotic even if you start to feel better. Use any implants or pessary as told by your health care provider. If needed, wear pads to absorb urine leakage. Keep a log to track how much and when you drink, and when you need to urinate. This will help your health care provider monitor your condition. Keep all follow-up visits. This is important. Contact a health care provider if: You have a fever or chills. Your symptoms do not get better with treatment. Your pain and discomfort get worse. You have more frequent urges to urinate. Get help right away if: You are not able to control your bladder. Summary Overactive bladder refers to a condition in which a person has a sudden and frequent need to urinate. Several conditions may lead to an overactive bladder. Treatment for overactive bladder depends on the cause and severity of your condition. Making lifestyle changes, doing Kegel exercises, keeping a log, and taking medicines can help with this condition. This information is not intended to replace advice given to you by your health care provider. Make sure you discuss any questions you have with your health care provider. Document Revised: 04/07/2020 Document Reviewed: 04/07/2020 Elsevier Patient Education  [employer].

## 2021-09-16 NOTE — Assessment & Plan Note (Signed)
Noted on past imaging -- slight increase in size on recent imaging 04/21/20.  Plan on repeat imaging, ordered today, and if ongoing growth consider referral to nephrology or endocrinology for further evaluation.

## 2021-09-17 LAB — BASIC METABOLIC PANEL
BUN/Creatinine Ratio: 17 (ref 12–28)
BUN: 13 mg/dL (ref 8–27)
CO2: 24 mmol/L (ref 20–29)
Calcium: 9.1 mg/dL (ref 8.7–10.3)
Chloride: 106 mmol/L (ref 96–106)
Creatinine, Ser: 0.76 mg/dL (ref 0.57–1.00)
Glucose: 89 mg/dL (ref 70–99)
Potassium: 4.2 mmol/L (ref 3.5–5.2)
Sodium: 143 mmol/L (ref 134–144)
eGFR: 89 mL/min/{1.73_m2} (ref >59)

## 2021-09-17 LAB — T4, FREE: Free T4: 1.04 ng/dL (ref 0.82–1.77)

## 2021-09-17 LAB — TSH: TSH: 0.556 u[IU]/mL (ref 0.450–4.500)

## 2021-09-17 LAB — HEPATITIS C ANTIBODY: Hep C Virus Ab: NONREACTIVE

## 2021-09-17 NOTE — Progress Notes (Signed)
Good morning crew, please let Bonnee know her labs have returned.  Thyroid levels are now normal range.  Hep C screening is negative, good news!!  Kidney function remains stable.  Any questions? Keep being wonderful!!  Thank you for allowing me to participate in your care.  I appreciate you. Kindest regards, Johan Creveling

## 2021-09-22 ENCOUNTER — Encounter: Payer: Self-pay | Admitting: Student in an Organized Health Care Education/Training Program

## 2021-09-22 ENCOUNTER — Ambulatory Visit
Payer: BC Managed Care – PPO | Attending: Student in an Organized Health Care Education/Training Program | Admitting: Student in an Organized Health Care Education/Training Program

## 2021-09-22 ENCOUNTER — Other Ambulatory Visit: Payer: Self-pay

## 2021-09-22 VITALS — BP 132/77 | HR 78 | Temp 97.3°F | Ht 69.0 in | Wt 293.0 lb

## 2021-09-22 DIAGNOSIS — G5701 Lesion of sciatic nerve, right lower limb: Secondary | ICD-10-CM | POA: Insufficient documentation

## 2021-09-22 DIAGNOSIS — M533 Sacrococcygeal disorders, not elsewhere classified: Secondary | ICD-10-CM | POA: Insufficient documentation

## 2021-09-22 DIAGNOSIS — M47818 Spondylosis without myelopathy or radiculopathy, sacral and sacrococcygeal region: Secondary | ICD-10-CM | POA: Insufficient documentation

## 2021-09-22 DIAGNOSIS — M5416 Radiculopathy, lumbar region: Secondary | ICD-10-CM | POA: Diagnosis not present

## 2021-09-22 DIAGNOSIS — G5702 Lesion of sciatic nerve, left lower limb: Secondary | ICD-10-CM | POA: Diagnosis not present

## 2021-09-22 DIAGNOSIS — G8929 Other chronic pain: Secondary | ICD-10-CM | POA: Insufficient documentation

## 2021-09-22 DIAGNOSIS — M792 Neuralgia and neuritis, unspecified: Secondary | ICD-10-CM | POA: Diagnosis not present

## 2021-09-22 DIAGNOSIS — G894 Chronic pain syndrome: Secondary | ICD-10-CM | POA: Diagnosis not present

## 2021-09-22 MED ORDER — TRAMADOL HCL 50 MG PO TABS: 50.0000 mg | ORAL_TABLET | Freq: Four times a day (QID) | ORAL | 0 refills | Status: AC | PRN

## 2021-09-22 MED ORDER — PREGABALIN 100 MG PO CAPS: 100.0000 mg | ORAL_CAPSULE | Freq: Two times a day (BID) | ORAL | 2 refills | Status: DC

## 2021-09-22 NOTE — Progress Notes (Signed)
Nursing Pain Medication Assessment:  Safety precautions to be maintained throughout the outpatient stay will include: orient to surroundings, keep bed in low position, maintain call bell within reach at all times, provide assistance with transfer out of bed and ambulation.  

## 2021-09-22 NOTE — Patient Instructions (Addendum)
Decrease Lyrica to 100 mg daily for 2 weeks and see how you do. If you pain worsens during those 2 weeks, increase back to 100 mg twice daily. Small Rx for Tramadol 100 mg BID prn Start exercising [employer]!!!!!

## 2021-09-22 NOTE — Progress Notes (Signed)
PROVIDER NOTE: Information contained herein reflects review and annotations entered in association with encounter. Interpretation of such information and data should be left to medically-trained personnel. Information provided to patient can be located elsewhere in the medical record under "Patient Instructions". Document created using STT-dictation technology, any transcriptional errors that may result from process are unintentional.    Patient: Erin Contreras  Service Category: E/M  Provider: Gillis Santa, MD  DOB: April 02, 1961  DOS: 09/22/2021  Specialty: Interventional Pain Management  MRN: [id number]  Setting: Ambulatory outpatient  PCP: Venita Lick, NP  Type: Established Patient    Referring Provider: Venita Lick, NP  Location: Office  Delivery: Face-to-face     HPI  [name], a 61 y.o. year old female, is here today because of her SI joint arthritis [M47.818]. [name] primary complain today is Back Pain (lower)  Last encounter: My last encounter with her was on 06/22/21  Pertinent problems: [name] has OA (osteoarthritis) of knee; Obesity; Chronic radicular lumbar pain; Lumbar spondylosis; Lumbar degenerative disc disease; Sacroiliac joint pain; SI joint arthritis; and Chronic pain syndrome on their pertinent problem list. Pain Assessment: Severity of Chronic pain is reported as a 8 /10. Location: Back Left, Right/pain radiaties down her right side to her hip, leg, ankle and toes. Onset: More than a month ago. Quality: Aching, Sharp, Shooting, Constant. Timing: Constant. Modifying factor(s): nothing. Vitals:  height is $RemoveB'5\' 9"'munwMPlG$  (1.753 m) and weight is 293 lb (132.9 kg). Her temperature is 97.3 F (36.3 C) (abnormal). Her blood pressure is 132/77 and her pulse is 78.   Reason for encounter: worsening of previously known (established) problem    Erin Contreras presents today for medication management and increased low back pain related to lumbar degenerative disc disease,  lumbar spondylosis, SI joint arthropathy.  She states that she will be seeking retirement next week which she is excited about. She feels that the Lyrica is not very helpful.  I instructed her to decrease her Lyrica to 100 mg for 1 to 2 weeks and assess her pain response.  If her pain worsens during this time, it lets [place] know that the Lyrica is helpful and she should go back to 100 mg twice a day.  If her pain does not increase, she can consider dose reduction.  I will add on tramadol for patient to take for breakthrough pain.  We spent a great deal of time discussing the importance of physical activity, weight loss as a pertains to low back pain and leg pain.  Patient states that she should have more time after retirement to focus on physical activity and exercise.  I offered her a referral to physical therapy however she states that she has exercises on handouts that she received from there.   ROS  Constitutional: Denies any fever or chills Gastrointestinal: No reported hemesis, hematochezia, vomiting, or acute GI distress Musculoskeletal:  Right low back, right buttock, right hip,  Neurological: No reported episodes of acute onset apraxia, aphasia, dysarthria, agnosia, amnesia, paralysis, loss of coordination, or loss of consciousness  Medication Review  DULoxetine, Menthol (Topical Analgesic), abatacept, acyclovir, albuterol, celecoxib, diclofenac Sodium, lidocaine, methotrexate, nystatin, omeprazole, pregabalin, and traMADol  History Review  Allergy: [name] has No Known Allergies. Drug: [name]  reports no history of drug use. Alcohol:  reports no history of alcohol use. Tobacco:  reports that she has never smoked. She has never used smokeless tobacco. Social: [name]  reports that she has never  smoked. She has never used smokeless tobacco. She reports that she does not drink alcohol and does not use drugs. Medical:  has a past medical history of Boils, Ear mass, GERD  (gastroesophageal reflux disease), Hemorrhoids, Herpes simplex virus infection, HSV infection, Lumbago with sciatica, OA (osteoarthritis) of knee, Obesity, Overactive bladder, PONV (postoperative nausea and vomiting), Pre-diabetes, RA (rheumatoid arthritis) ([place]), Sleep apnea, and Urge and stress incontinence. Surgical: [name]  has a past surgical history that includes Cholecystectomy (2011); Tumor removal (Right, 2010); Sleeve Gastroplasty; Fracture surgery (Right); Knee Arthroplasty (Right, 07/11/2017); Knee Arthroplasty (Left, 01/28/2021); Joint replacement (Left, 01/28/2021); and Colonoscopy (N/A, 03/19/2021). Family: family history includes Cancer in her mother; Cirrhosis in her father; Diabetes in her mother, paternal grandfather, and paternal grandmother; Heart disease in her paternal grandfather and paternal grandmother; Hyperlipidemia in her mother; Hypertension in her daughter and mother; Stroke in her maternal grandfather and maternal grandmother.  Laboratory Chemistry Profile   Renal Lab Results  Component Value Date   BUN 13 09/16/2021   CREATININE 0.76 09/16/2021   BCR 17 09/16/2021   GFRAA 102 06/12/2020   GFRNONAA >60 01/21/2021     Hepatic Lab Results  Component Value Date   AST 18 08/14/2021   ALT 15 08/14/2021   ALBUMIN 3.7 (L) 08/14/2021   ALKPHOS 104 08/14/2021   LIPASE 106 05/10/2014     Electrolytes Lab Results  Component Value Date   NA 143 09/16/2021   K 4.2 09/16/2021   CL 106 09/16/2021   CALCIUM 9.1 09/16/2021   MG 2.0 09/04/2014   PHOS 3.7 09/04/2014     Bone Lab Results  Component Value Date   VD25OH 24.3 (L) 10/17/2020     Inflammation (CRP: Acute Phase) (ESR: Chronic Phase) Lab Results  Component Value Date   CRP 0.6 01/21/2021   ESRSEDRATE 43 (H) 01/21/2021       Note: Above Lab results reviewed.   Physical Exam  General appearance: Well nourished, well developed, and well hydrated. In no apparent acute distress Mental status:  Alert, oriented x 3 (person, place, & time)       Respiratory: No evidence of acute respiratory distress Eyes: PERLA Vitals: BP 132/77    Pulse 78    Temp (!) 97.3 F (36.3 C)    Ht $R'5\' 9"'Qd$  (1.753 m)    Wt 293 lb (132.9 kg)    BMI 43.27 kg/m  BMI: Estimated body mass index is 43.27 kg/m as calculated from the following:   Height as of this encounter: $RemoveBeforeD'5\' 9"'aNCQEPBTVbMWmh$  (1.753 m).   Weight as of this encounter: 293 lb (132.9 kg). Ideal: Ideal body weight: 66.2 kg (145 lb 15.1 oz) Adjusted ideal body weight: 92.9 kg (204 lb 12.3 oz)  Lumbar Spine Area Exam  Skin & Axial Inspection: No masses, redness, or swelling Alignment: Symmetrical Functional ROM: Pain restricted ROM affecting primarily the right Stability: No instability detected Muscle Tone/Strength: Functionally intact. No obvious neuro-muscular anomalies detected. Sensory (Neurological): Musculoskeletal pain pattern  Gait & Posture Assessment  Ambulation: Limited Gait: Antalgic Posture: Difficulty standing up straight, due to pain  Lower Extremity Exam    Side: Right lower extremity  Side: Left lower extremity  Stability: No instability observed          Stability: No instability observed          Skin & Extremity Inspection: Skin color, temperature, and hair growth are WNL. No peripheral edema or cyanosis. No masses, redness, swelling, asymmetry, or associated skin lesions. No  contractures.  Skin & Extremity Inspection: Skin color, temperature, and hair growth are WNL. No peripheral edema or cyanosis. No masses, redness, swelling, asymmetry, or associated skin lesions. No contractures.  Functional ROM: Pain restricted ROM for hip and knee joints          Functional ROM: Unrestricted ROM                  Muscle Tone/Strength: Functionally intact. No obvious neuro-muscular anomalies detected.  Muscle Tone/Strength: Functionally intact. No obvious neuro-muscular anomalies detected.  Sensory (Neurological): Arthropathic arthralgia        Sensory  (Neurological): Unimpaired        DTR: Patellar: deferred today Achilles: deferred today Plantar: deferred today  DTR: Patellar: deferred today Achilles: deferred today Plantar: deferred today  Palpation: No palpable anomalies  Palpation: No palpable anomalies    Assessment   Status Diagnosis  Persistent Persistent Persistent 1. SI joint arthritis   2. Piriformis syndrome of right side   3. Piriformis syndrome of left side   4. Sacroiliac joint pain   5. Lumbar radiculopathy (right)   6. Chronic radicular lumbar pain (right)   7. Neurogenic pain   8. Chronic pain syndrome      Updated Problems: No problems updated.   Plan of Care  Erin Contreras has a current medication list which includes the following long-term medication(s): orencia, albuterol, omeprazole, duloxetine, and pregabalin.  Requested Prescriptions   Signed Prescriptions Disp Refills   traMADol (ULTRAM) 50 MG tablet 120 tablet 0    Sig: Take 1 tablet (50 mg total) by mouth every 6 (six) hours as needed for severe pain.   pregabalin (LYRICA) 100 MG capsule 60 capsule 2    Sig: Take 1 capsule (100 mg total) by mouth 2 (two) times daily.     Follow-up plan:   Return in about 8 weeks (around 11/17/2021) for Medication Management, in person.     Right L5-S1 ESI 05/28/2020 only helped for 1 week, return of pain.  Consider diagnostic lumbar facet medial branch nerve blocks in future.      Recent Visits No visits were found meeting these conditions. Showing recent visits within past 90 days and meeting all other requirements Today's Visits Date Type Provider Dept  09/22/21 Office Visit Gillis Santa, MD [facility]  Showing today's visits and meeting all other requirements Future Appointments No visits were found meeting these conditions. Showing future appointments within next 90 days and meeting all other requirements  I discussed the assessment and treatment plan with the patient. The  patient was provided an opportunity to ask questions and all were answered. The patient agreed with the plan and demonstrated an understanding of the instructions.  Patient advised to call back or seek an in-person evaluation if the symptoms or condition worsens.  Duration of encounter: 30 minutes.  Note by: Gillis Santa, MD Date: 09/22/2021; Time: 11:02 AM

## 2021-09-23 ENCOUNTER — Telehealth: Payer: Self-pay | Admitting: Nurse Practitioner

## 2021-09-23 ENCOUNTER — Ambulatory Visit: Payer: BC Managed Care – PPO

## 2021-09-23 NOTE — Telephone Encounter (Signed)
Noted, thank you

## 2021-09-23 NOTE — Telephone Encounter (Signed)
Copied from [place] [id number]. Topic: General - Other >> Sep 23, 2021 12:58 PM [name] wrote: Reason for CRM: Ultrasound department called in to inform Erin Contreras that patient was a no show for her renal ultrasound appointment. Please advise

## 2021-10-17 NOTE — Patient Instructions (Incomplete)

## 2021-10-21 ENCOUNTER — Ambulatory Visit: Payer: Self-pay | Admitting: Nurse Practitioner

## 2021-11-17 ENCOUNTER — Encounter: Payer: Self-pay | Admitting: Student in an Organized Health Care Education/Training Program

## 2021-11-17 ENCOUNTER — Ambulatory Visit
Payer: Self-pay | Attending: Student in an Organized Health Care Education/Training Program | Admitting: Student in an Organized Health Care Education/Training Program

## 2021-11-17 DIAGNOSIS — G5702 Lesion of sciatic nerve, left lower limb: Secondary | ICD-10-CM | POA: Insufficient documentation

## 2021-11-17 DIAGNOSIS — G8929 Other chronic pain: Secondary | ICD-10-CM | POA: Insufficient documentation

## 2021-11-17 DIAGNOSIS — M5416 Radiculopathy, lumbar region: Secondary | ICD-10-CM | POA: Insufficient documentation

## 2021-11-17 DIAGNOSIS — G894 Chronic pain syndrome: Secondary | ICD-10-CM | POA: Insufficient documentation

## 2021-11-17 DIAGNOSIS — M792 Neuralgia and neuritis, unspecified: Secondary | ICD-10-CM | POA: Insufficient documentation

## 2021-11-17 DIAGNOSIS — G5701 Lesion of sciatic nerve, right lower limb: Secondary | ICD-10-CM | POA: Insufficient documentation

## 2021-11-17 DIAGNOSIS — M47818 Spondylosis without myelopathy or radiculopathy, sacral and sacrococcygeal region: Secondary | ICD-10-CM | POA: Insufficient documentation

## 2021-11-17 DIAGNOSIS — M533 Sacrococcygeal disorders, not elsewhere classified: Secondary | ICD-10-CM | POA: Insufficient documentation

## 2021-11-17 MED ORDER — TRAMADOL HCL 50 MG PO TABS: 50.0000 mg | ORAL_TABLET | Freq: Four times a day (QID) | ORAL | 0 refills | Status: AC | PRN

## 2021-11-17 MED ORDER — PREGABALIN 100 MG PO CAPS: 100.0000 mg | ORAL_CAPSULE | Freq: Two times a day (BID) | ORAL | 3 refills | Status: DC

## 2021-11-17 MED ORDER — DULOXETINE HCL 20 MG PO CPEP: 40.0000 mg | ORAL_CAPSULE | Freq: Every day | ORAL | 2 refills | Status: DC

## 2021-11-17 MED ORDER — LIDOCAINE 4 % EX PTCH: 1.0000 | MEDICATED_PATCH | CUTANEOUS | 2 refills | Status: DC

## 2021-11-17 NOTE — Progress Notes (Signed)
PROVIDER NOTE: Information contained herein reflects review and annotations entered in association with encounter. Interpretation of such information and data should be left to medically-trained personnel. Information provided to patient can be located elsewhere in the medical record under "Patient Instructions". Document created using STT-dictation technology, any transcriptional errors that may result from process are unintentional.  ?  ?Patient: Erin Contreras  Service Category: E/M  Provider: Gillis Santa, MD  ?DOB: May 08, 1961  DOS: 11/17/2021  Specialty: Interventional Pain Management  ?MRN: [id number]  Setting: Ambulatory outpatient  PCP: Venita Lick, NP  ?Type: Established Patient    Referring Provider: Venita Lick, NP  ?Location: Office  Delivery: Face-to-face    ? ?HPI  ?[name], a 61 y.o. year old female, is here today because of her No primary diagnosis found.. Erin Contreras's primary complain today is Back Pain ? ?Last encounter: My last encounter with her was on 09/22/21 ? ?Pertinent problems: Erin Contreras has OA (osteoarthritis) of knee; Obesity; Chronic radicular lumbar pain; Lumbar spondylosis; Lumbar degenerative disc disease; Sacroiliac joint pain; SI joint arthritis; and Chronic pain syndrome on their pertinent problem list. ?Pain Assessment: Severity of Chronic pain is reported as a 10-Worst pain ever/10. Location: Back Lower/buttocks bilateral down back of legs bilateral hips, effects ankl;e and toes, right side worse. Onset:  . Quality: Aching, Sharp, Discomfort, Nagging. Timing:  . Modifying factor(s): medications, rest. ?Vitals:  height is _0  (1.753 m) and weight is 293 lb (132.9 kg). Her temperature is 97.3 ?F (36.3 ?C) (abnormal). Her blood pressure is 149/90 (abnormal). Her respiration is 18 and oxygen saturation is 97%.  ? ?Reason for encounter: medication management.   ? ?Erin Contreras presents today for medication management.  Since her last clinic visit with me, she has  retired from her job.  She states that she is moving less but continues to have moderate to severe pain at times. ?We discussed alternative and complementary techniques/therapies for pain management including acupuncture, yoga, aquatic therapy, TENS unit which are provided her resources for. ?Also recommend lidocaine patch application to her low back. ?Recommend increase in Cymbalta to 40 mg.  Patient is also on tramadol so we will monitor for serotonin syndrome.  Patient made aware of symptoms ?Refill Lyrica and tramadol as below. ? ? ?ROS  ?Constitutional: Denies any fever or chills ?Gastrointestinal: No reported hemesis, hematochezia, vomiting, or acute GI distress ?Musculoskeletal:  Right low back, right buttock, right hip,  ?Neurological: No reported episodes of acute onset apraxia, aphasia, dysarthria, agnosia, amnesia, paralysis, loss of coordination, or loss of consciousness ? ?Medication Review  ?DULoxetine, Menthol (Topical Analgesic), abatacept, acyclovir, albuterol, celecoxib, diclofenac Sodium, lidocaine, methotrexate, nystatin, omeprazole, pregabalin, and traMADol ? ?History Review  ?Allergy: Erin Contreras has No Known Allergies. ?Drug: Erin Contreras  reports no history of drug use. ?Alcohol:  reports no history of alcohol use. ?Tobacco:  reports that she has never smoked. She has never used smokeless tobacco. ?Social: Erin Contreras  reports that she has never smoked. She has never used smokeless tobacco. She reports that she does not drink alcohol and does not use drugs. ?Medical:  has a past medical history of Boils, Ear mass, GERD (gastroesophageal reflux disease), Hemorrhoids, Herpes simplex virus infection, HSV infection, Lumbago with sciatica, OA (osteoarthritis) of knee, Obesity, Overactive bladder, PONV (postoperative nausea and vomiting), Pre-diabetes, RA (rheumatoid arthritis) ([place]), Sleep apnea, and Urge and stress incontinence. ?Surgical: Erin Contreras  has a past surgical history that includes  Cholecystectomy (2011); Tumor removal (Right,  2010); Sleeve Gastroplasty; Fracture surgery (Right); Knee Arthroplasty (Right, 07/11/2017); Knee Arthroplasty (Left, 01/28/2021); Joint replacement (Left, 01/28/2021); and Colonoscopy (N/A, 03/19/2021). ?Family: family history includes Cancer in her mother; Cirrhosis in her father; Diabetes in her mother, paternal grandfather, and paternal grandmother; Heart disease in her paternal grandfather and paternal grandmother; Hyperlipidemia in her mother; Hypertension in her daughter and mother; Stroke in her maternal grandfather and maternal grandmother. ? ?Laboratory Chemistry Profile  ? ?Renal ?Lab Results  ?Component Value Date  ? BUN 13 09/16/2021  ? CREATININE 0.76 09/16/2021  ? BCR 17 09/16/2021  ? GFRAA 102 06/12/2020  ? GFRNONAA >60 01/21/2021  ? ?  Hepatic ?Lab Results  ?Component Value Date  ? AST 18 08/14/2021  ? ALT 15 08/14/2021  ? ALBUMIN 3.7 (L) 08/14/2021  ? ALKPHOS 104 08/14/2021  ? LIPASE 106 05/10/2014  ? ?  ?Electrolytes ?Lab Results  ?Component Value Date  ? NA 143 09/16/2021  ? K 4.2 09/16/2021  ? CL 106 09/16/2021  ? CALCIUM 9.1 09/16/2021  ? MG 2.0 09/04/2014  ? PHOS 3.7 09/04/2014  ? ?  Bone ?Lab Results  ?Component Value Date  ? VD25OH 24.3 (L) 10/17/2020  ? ?  ?Inflammation (CRP: Acute Phase) (ESR: Chronic Phase) ?Lab Results  ?Component Value Date  ? CRP 0.6 01/21/2021  ? ESRSEDRATE 43 (H) 01/21/2021  ? ?    ?Note: Above Lab results reviewed. ? ? ?Physical Exam  ?General appearance: Well nourished, well developed, and well hydrated. In no apparent acute distress ?Mental status: Alert, oriented x 3 (person, place, & time)       ?Respiratory: No evidence of acute respiratory distress ?Eyes: PERLA ?Vitals: BP (!) 149/90   Temp (!) 97.3 ?F (36.3 ?C)   Resp 18   Ht _0  (1.753 m)   Wt 293 lb (132.9 kg)   SpO2 97%   BMI 43.27 kg/m?  ?BMI: Estimated body mass index is 43.27 kg/m? as calculated from the following: ?  Height as of this encounter: 5'  9" (1.753 m). ?  Weight as of this encounter: 293 lb (132.9 kg). ?Ideal: Ideal body weight: 66.2 kg (145 lb 15.1 oz) ?Adjusted ideal body weight: 92.9 kg (204 lb 12.3 oz) ? ?Lumbar Spine Area Exam  ?Skin & Axial Inspection: No masses, redness, or swelling ?Alignment: Symmetrical ?Functional ROM: Pain restricted ROM affecting primarily the right >left ?Stability: No instability detected ?Muscle Tone/Strength: Functionally intact. No obvious neuro-muscular anomalies detected. ?Sensory (Neurological): Musculoskeletal pain pattern right low back, hip, SI joint ? ?Gait & Posture Assessment  ?Ambulation: Limited ?Gait: Antalgic ?Posture: Difficulty standing up straight, due to pain  ?Lower Extremity Exam    ?Side: Right lower extremity  Side: Left lower extremity  ?Stability: No instability observed          Stability: No instability observed          ?Skin & Extremity Inspection: Skin color, temperature, and hair growth are WNL. No peripheral edema or cyanosis. No masses, redness, swelling, asymmetry, or associated skin lesions. No contractures.  Skin & Extremity Inspection: Skin color, temperature, and hair growth are WNL. No peripheral edema or cyanosis. No masses, redness, swelling, asymmetry, or associated skin lesions. No contractures.  ?Functional ROM: Pain restricted ROM for hip and knee joints ?         Functional ROM: Unrestricted ROM         ?         ?Muscle Tone/Strength: Functionally intact. No obvious neuro-muscular anomalies  detected.  Muscle Tone/Strength: Functionally intact. No obvious neuro-muscular anomalies detected.  ?Sensory (Neurological): Arthropathic arthralgia        Sensory (Neurological): Unimpaired        ?DTR: ?Patellar: deferred today ?Achilles: deferred today ?Plantar: deferred today  DTR: ?Patellar: deferred today ?Achilles: deferred today ?Plantar: deferred today  ?Palpation: No palpable anomalies  Palpation: No palpable anomalies  ? ? ?Assessment  ? ?Status Diagnosis   ?Persistent ?Persistent ?Persistent 1. SI joint arthritis   ?2. Piriformis syndrome of right side   ?3. Piriformis syndrome of left side   ?4. Sacroiliac joint pain   ?5. Chronic pain syndrome   ?6. Lumbar radiculopathy (right)   ?7. C

## 2021-11-17 NOTE — Progress Notes (Signed)
Safety precautions to be maintained throughout the outpatient stay will include: orient to surroundings, keep bed in low position, maintain call bell within reach at all times, provide assistance with transfer out of bed and ambulation.  ? ? Did not bring medications or bottle with her today . ?Patient will think about procedure when she has insurance. ?

## 2021-11-17 NOTE — Patient Instructions (Signed)
?  Consider TENS unit from [facility] ? ?

## 2022-01-05 ENCOUNTER — Ambulatory Visit: Payer: Self-pay

## 2022-01-05 ENCOUNTER — Other Ambulatory Visit: Payer: Self-pay

## 2022-01-05 ENCOUNTER — Emergency Department
Admission: EM | Admit: 2022-01-05 | Discharge: 2022-01-05 | Disposition: A | Discharge status: Home / Self Care | Payer: Medicaid Other | Attending: Emergency Medicine | Admitting: Emergency Medicine

## 2022-01-05 ENCOUNTER — Emergency Department: Payer: Medicaid Other

## 2022-01-05 DIAGNOSIS — R0789 Other chest pain: Secondary | ICD-10-CM | POA: Insufficient documentation

## 2022-01-05 LAB — CBC
HCT: 39.8 % (ref 36.0–46.0)
Hemoglobin: 12.8 g/dL (ref 12.0–15.0)
MCH: 28.2 pg (ref 26.0–34.0)
MCHC: 32.2 g/dL (ref 30.0–36.0)
MCV: 87.7 fL (ref 80.0–100.0)
Platelets: 291 10*3/uL (ref 150–400)
RBC: 4.54 MIL/uL (ref 3.87–5.11)
RDW: 14.3 % (ref 11.5–15.5)
WBC: 7.9 10*3/uL (ref 4.0–10.5)
nRBC: 0 % (ref 0.0–0.2)

## 2022-01-05 LAB — BASIC METABOLIC PANEL
Anion gap: 6 (ref 5–15)
BUN: 12 mg/dL (ref 8–23)
CO2: 24 mmol/L (ref 22–32)
Calcium: 8.9 mg/dL (ref 8.9–10.3)
Chloride: 109 mmol/L (ref 98–111)
Creatinine, Ser: 0.7 mg/dL (ref 0.44–1.00)
GFR, Estimated: >60 mL/min (ref >60)
Glucose, Bld: 108 mg/dL — ABNORMAL HIGH (ref 70–99)
Potassium: 3.8 mmol/L (ref 3.5–5.1)
Sodium: 139 mmol/L (ref 135–145)

## 2022-01-05 LAB — TROPONIN I (HIGH SENSITIVITY): Troponin I (High Sensitivity): 5 ng/L (ref <18)

## 2022-01-05 MED ORDER — PANTOPRAZOLE SODIUM 40 MG PO TBEC: 40.0000 mg | DELAYED_RELEASE_TABLET | Freq: Every day | ORAL | 1 refills | Status: DC

## 2022-01-05 NOTE — ED Provider Notes (Signed)
[facility] Provider Note    Event Date/Time   First MD Initiated Contact with Patient 01/05/22 1127     (approximate)   History   Chest Pain   HPI  Erin Contreras is a 61 y.o. female with a history of GERD, obesity, prediabetes, rheumatoid arthritis who presents with complaints of chest discomfort.  Patient reports intermittent burning chest discomfort in the center of her chest which is occurred over the last several weeks.  It occurs at all times the day, she notices it prickly when she is lying down.  She has taken Zantac without significant improvement.  No fevers or chills.  No shortness of breath.  No calf pain or swelling.     Physical Exam   Triage Vital Signs: ED Triage Vitals  Enc Vitals Group     BP 01/05/22 1032 121/88     Pulse Rate 01/05/22 1032 87     Resp 01/05/22 1032 17     Temp 01/05/22 1032 98.3 F (36.8 C)     Temp Source 01/05/22 1032 Oral     SpO2 01/05/22 1032 96 %     Weight 01/05/22 1033 134.7 kg (297 lb)     Height 01/05/22 1033 1.753 m ('5\' 9"'$ )     Head Circumference --      Peak Flow --      Pain Score 01/05/22 1032 0     Pain Loc --      Pain Edu? --      Excl. in [place]? --     Most recent vital signs: Vitals:   01/05/22 1032  BP: 121/88  Pulse: 87  Resp: 17  Temp: 98.3 F (36.8 C)  SpO2: 96%     General: Awake, no distress.  CV:  Good peripheral perfusion.  Resp:  Normal effort.  CTA bilaterally Abd:  No distention.  Other:     ED Results / Procedures / Treatments   Labs (all labs ordered are listed, but only abnormal results are displayed) Labs Reviewed  BASIC METABOLIC PANEL - Abnormal; Notable for the following components:      Result Value   Glucose, Bld 108 (*)    All other components within normal limits  CBC  TROPONIN I (HIGH SENSITIVITY)  TROPONIN I (HIGH SENSITIVITY)     EKG  ED ECG REPORT I, Lav[place] Drafts, the attending physician, personally viewed and interpreted this  ECG.  Date: 01/05/2022 EKG Time:  Rate:  Rhythm: normal sinus rhythm QRS Axis: normal Intervals: normal ST/T Wave abnormalities: normal Narrative Interpretation: no evidence of acute ischemia    RADIOLOGY Chest x-ray viewed and interpreted by me, no acute abnormality    PROCEDURES:  Critical Care performed:   Procedures   MEDICATIONS ORDERED IN ED: Medications - No data to display   IMPRESSION / MDM / [place] / ED COURSE  I reviewed the triage vital signs and the nursing notes. Patient's presentation is most consistent with acute presentation with potential threat to life or bodily function.  Patient presents with chest pain over the last several weeks as detailed above.  Differential includes GERD, esophagitis, angina/ACS, pneumonia  Symptoms are most consistent with GERD, EKG and high sensitive troponin are reassuring  CBC BMP are unremarkable.  Chest x-ray without evidence of pneumonia or edema  We will start the patient on Protonix, have placed referral for cardiology as well given age additional risk factors.  No indication for admission today as she  is asymptomatic at this time.  Return precautions discussed        FINAL CLINICAL IMPRESSION(S) / ED DIAGNOSES   Final diagnoses:  Atypical chest pain     Rx / DC Orders   ED Discharge Orders          Ordered    Ambulatory referral to Cardiology       Comments: If you have not heard from the Cardiology office within the next 72 hours please call [phone].   01/05/22 1136    pantoprazole (PROTONIX) 40 MG tablet  Daily        01/05/22 1136             Note:  This document was prepared using Dragon voice recognition software and may include unintentional dictation errors.   [place] Drafts, MD 01/05/22 [phone]

## 2022-01-05 NOTE — Telephone Encounter (Signed)
Routing to provider as an Pharmacist, hospital. Patient is currently at the ED according to chart review.

## 2022-01-05 NOTE — Telephone Encounter (Signed)
    Chief Complaint: chest pain Symptoms: mid to left sided  intermittent and frequent chest pain radiating to left arm, nausea, dizziness over the weekend none today, cough Frequency: 3 weeks Pertinent Negatives: Patient denies dizziness today, vomiting, sweating, fever, difficulty breathing Disposition: '[x]'$ ED /'[]'$ Urgent Care (no appt availability in office) / '[]'$ Appointment(In office/virtual)/ '[]'$  [place] [facility]/ '[]'$ Home Care/ '[]'$ Refused Recommended Disposition /'[]'$ [place] [facility]/ '[]'$  Follow-up with PCP Additional Notes: pt will be driven to ED     Reason for Disposition  [1] Chest pain (or "angina") comes and goes AND [2] is happening more often (increasing in frequency) or getting worse (increasing in severity) (Exception: chest pains that last only a few seconds)  Answer Assessment - Initial Assessment Questions 1. LOCATION: "Where does it hurt?"       Middle and to the left 2. RADIATION: "Does the pain go anywhere else?" (e.g., into neck, jaw, arms, back)     Left arm 3. ONSET: "When did the chest pain begin?" (Minutes, hours or days)      3 weeks 4. PATTERN "Does the pain come and go, or has it been constant since it started?"  "Does it get worse with exertion?"      Comes and goes- yes 5. DURATION: "How long does it last" (e.g., seconds, minutes, hours)     Seconds and comes back 30 minute 6. SEVERITY: "How bad is the pain?"  (e.g., Scale 1-10; mild, moderate, or severe)    - MILD (1-3): doesn't interfere with normal activities     - MODERATE (4-7): interferes with normal activities or awakens from sleep    - SEVERE (8-10): excruciating pain, unable to do any normal activities       mild 7. CARDIAC RISK FACTORS: "Do you have any history of heart problems or risk factors for heart disease?" (e.g., angina, prior heart attack; diabetes, high blood pressure, high cholesterol, smoker, or strong family history of heart disease)     Borderline diabetes, high  cholesterol 8. PULMONARY RISK FACTORS: "Do you have any history of lung disease?"  (e.g., blood clots in lung, asthma, emphysema, birth control pills)     no 9. CAUSE: "What do you think is causing the chest pain?"     Acid reflux 10. OTHER SYMPTOMS: "Do you have any other symptoms?" (e.g., dizziness, nausea, vomiting, sweating, fever, difficulty breathing, cough)       Hurts when eats, nausea, cough  Protocols used: Chest Pain-A-AH

## 2022-01-05 NOTE — ED Triage Notes (Signed)
Pt c/o burning chest pain intermittently for the past 3 weeks with nausea. States she thought it was heart burn and has been taking zantac and pepto with no relief. Denies SOB or other sx with it. States she has been having "hot flashes".Marland Kitchen

## 2022-01-06 ENCOUNTER — Telehealth: Payer: Self-pay | Admitting: *Deleted

## 2022-01-06 NOTE — Telephone Encounter (Signed)
Transition Care Management Follow-up Telephone Call Date of discharge and from where: [facility] 01-05-2022  How have you been since you were released from the hospital? Feeling a lot better Any questions or concerns? No  Items Reviewed: Did the pt receive and understand the discharge instructions provided? Yes  Medications obtained and verified? Yes  Other? No  Any new allergies since your discharge? No  Dietary orders reviewed? No Do you have support at home? Yes   Home Care and Equipment/Supplies: Were home health services ordered?   If so, what is the name of the agency?   Has the agency set up a time to come to the patient's home?  Were any new equipment or medical supplies ordered?   What is the name of the medical supply agency?  Were you able to get the supplies/equipment?  Do you have any questions related to the use of the equipment or supplies?   Functional Questionnaire: (I = Independent and D = Dependent) ADLs: I  Bathing/Dressing- I  Meal Prep- I  Eating- I  Maintaining continence- I  Transferring/Ambulation- I  Managing Meds- I  Follow up appointments reviewed:  PCP Hospital f/u appt confirmed? No  Scheduled to a referral was put in for Cardiology  will follow up with them . [facility] f/u appt confirmed? Referral placed will follow up as needed Are transportation arrangements needed? no If their condition worsens, is the pt aware to call PCP or go to the Emergency Dept.? Yes Was the patient provided with contact information for the PCP's office or ED? Yes Was to pt encouraged to call back with questions or concerns? Yes

## 2022-02-04 ENCOUNTER — Ambulatory Visit: Payer: Self-pay | Admitting: Cardiology

## 2022-02-11 ENCOUNTER — Ambulatory Visit: Payer: Self-pay | Admitting: Cardiology

## 2022-03-02 ENCOUNTER — Encounter: Payer: Self-pay | Admitting: Student in an Organized Health Care Education/Training Program

## 2022-03-02 ENCOUNTER — Ambulatory Visit
Payer: Medicaid Other | Attending: Student in an Organized Health Care Education/Training Program | Admitting: Student in an Organized Health Care Education/Training Program

## 2022-03-02 VITALS — BP 155/88 | HR 77 | Temp 97.0°F | Resp 18 | Ht 69.0 in | Wt 290.0 lb

## 2022-03-02 DIAGNOSIS — M47818 Spondylosis without myelopathy or radiculopathy, sacral and sacrococcygeal region: Secondary | ICD-10-CM | POA: Insufficient documentation

## 2022-03-02 DIAGNOSIS — M533 Sacrococcygeal disorders, not elsewhere classified: Secondary | ICD-10-CM | POA: Diagnosis not present

## 2022-03-02 DIAGNOSIS — G5702 Lesion of sciatic nerve, left lower limb: Secondary | ICD-10-CM | POA: Insufficient documentation

## 2022-03-02 DIAGNOSIS — G5701 Lesion of sciatic nerve, right lower limb: Secondary | ICD-10-CM | POA: Insufficient documentation

## 2022-03-02 DIAGNOSIS — G894 Chronic pain syndrome: Secondary | ICD-10-CM | POA: Insufficient documentation

## 2022-03-02 MED ORDER — DULOXETINE HCL 20 MG PO CPEP: 40.0000 mg | ORAL_CAPSULE | Freq: Every day | ORAL | 2 refills | Status: DC

## 2022-03-02 MED ORDER — TRAMADOL HCL 50 MG PO TABS: 50.0000 mg | ORAL_TABLET | Freq: Three times a day (TID) | ORAL | 2 refills | Status: DC | PRN

## 2022-03-02 MED ORDER — PREGABALIN 100 MG PO CAPS: 100.0000 mg | ORAL_CAPSULE | Freq: Every day | ORAL | 2 refills | Status: DC

## 2022-03-02 NOTE — Progress Notes (Signed)
Safety precautions to be maintained throughout the outpatient stay will include: orient to surroundings, keep bed in low position, maintain call bell within reach at all times, provide assistance with transfer out of bed and ambulation.  

## 2022-03-02 NOTE — Progress Notes (Addendum)
PROVIDER NOTE: Information contained herein reflects review and annotations entered in association with encounter. Interpretation of such information and data should be left to medically-trained personnel. Information provided to patient can be located elsewhere in the medical record under "Patient Instructions". Document created using STT-dictation technology, any transcriptional errors that may result from process are unintentional.    Patient: Erin Contreras  Service Category: E/M  Provider: Gillis Santa, MD  DOB: 01-30-1961  DOS: 03/02/2022  Specialty: Interventional Pain Management  MRN: [id number]  Setting: Ambulatory outpatient  PCP: Venita Lick, NP  Type: Established Patient    Referring Provider: Venita Lick, NP  Location: Office  Delivery: Face-to-face     HPI  Erin Contreras, a 61 y.o. year old female, is here today because of her SI joint arthritis [M47.818]. Erin Contreras primary complain today is Back Pain (low)  Last encounter: My last encounter with her was on 11/17/2021  Pertinent problems: Erin Contreras has OA (osteoarthritis) of knee; Obesity; Chronic radicular lumbar pain; Lumbar spondylosis; Lumbar degenerative disc disease; Sacroiliac joint pain; SI joint arthritis; and Chronic pain syndrome on their pertinent problem list. Pain Assessment: Severity of Chronic pain is reported as a 8 /10. Location: Back Lower/radiates down right leg in the side to ankle. Onset: More than a month ago. Quality: Aching, Shooting. Timing: Constant. Modifying factor(s): nothinig. Vitals:  height is _0  (1.753 m) and weight is 290 lb (131.5 kg). Her temperature is 97 F (36.1 C) (abnormal). Her blood pressure is 155/88 (abnormal) and her pulse is 77. Her respiration is 18 and oxygen saturation is 100%.   Reason for encounter: medication management.    Patient presents today for medication management.  She endorses an increase in her overall pain specifically in her low back.  She states  that she was unable to get her chronic pain medications filled given a lapse in her insurance coverage.  She states that she was able to obtain updated coverage  and now she should be able to get her medications.  I will represcribe her previous medications as a part of her chronic pain management plan.  PMP checked and reviewed.   ROS  Constitutional: Denies any fever or chills Gastrointestinal: No reported hemesis, hematochezia, vomiting, or acute GI distress Musculoskeletal:  Right greater than left low back, right buttock, right hip, pain Neurological: No reported episodes of acute onset apraxia, aphasia, dysarthria, agnosia, amnesia, paralysis, loss of coordination, or loss of consciousness  Medication Review  DULoxetine, abatacept, acyclovir, albuterol, phentermine, pregabalin, and traMADol  History Review  Allergy: Erin Contreras has No Known Allergies. Drug: Erin Contreras  reports no history of drug use. Alcohol:  reports no history of alcohol use. Tobacco:  reports that she has never smoked. She has never used smokeless tobacco. Social: Erin Contreras  reports that she has never smoked. She has never used smokeless tobacco. She reports that she does not drink alcohol and does not use drugs. Medical:  has a past medical history of Boils, Ear mass, GERD (gastroesophageal reflux disease), Hemorrhoids, Herpes simplex virus infection, HSV infection, Lumbago with sciatica, OA (osteoarthritis) of knee, Obesity, Overactive bladder, PONV (postoperative nausea and vomiting), Pre-diabetes, RA (rheumatoid arthritis) ([place]), Sleep apnea, and Urge and stress incontinence. Surgical: Erin Contreras  has a past surgical history that includes Cholecystectomy (2011); Tumor removal (Right, 2010); Sleeve Gastroplasty; Fracture surgery (Right); Knee Arthroplasty (Right, 07/11/2017); Knee Arthroplasty (Left, 01/28/2021); Joint replacement (Left, 01/28/2021); and Colonoscopy (N/A, 03/19/2021). Family: family history includes  Cancer in her mother; Cirrhosis in her father; Diabetes in her mother, paternal grandfather, and paternal grandmother; Heart disease in her paternal grandfather and paternal grandmother; Hyperlipidemia in her mother; Hypertension in her daughter and mother; Stroke in her maternal grandfather and maternal grandmother.  Laboratory Chemistry Profile   Renal Lab Results  Component Value Date   BUN 12 01/05/2022   CREATININE 0.70 01/05/2022   BCR 17 09/16/2021   GFRAA 102 06/12/2020   GFRNONAA >60 01/05/2022     Hepatic Lab Results  Component Value Date   AST 18 08/14/2021   ALT 15 08/14/2021   ALBUMIN 3.7 (L) 08/14/2021   ALKPHOS 104 08/14/2021   LIPASE 106 05/10/2014     Electrolytes Lab Results  Component Value Date   NA 139 01/05/2022   K 3.8 01/05/2022   CL 109 01/05/2022   CALCIUM 8.9 01/05/2022   MG 2.0 09/04/2014   PHOS 3.7 09/04/2014     Bone Lab Results  Component Value Date   VD25OH 24.3 (L) 10/17/2020     Inflammation (CRP: Acute Phase) (ESR: Chronic Phase) Lab Results  Component Value Date   CRP 0.6 01/21/2021   ESRSEDRATE 43 (H) 01/21/2021       Note: Above Lab results reviewed.   Physical Exam  General appearance: Well nourished, well developed, and well hydrated. In no apparent acute distress Mental status: Alert, oriented x 3 (person, place, & time)       Respiratory: No evidence of acute respiratory distress Eyes: PERLA Vitals: BP (!) 155/88   Pulse 77   Temp (!) 97 F (36.1 C)   Resp 18   Ht _0  (1.753 m)   Wt 290 lb (131.5 kg)   SpO2 100%   BMI 42.83 kg/m  BMI: Estimated body mass index is 42.83 kg/m as calculated from the following:   Height as of this encounter: _1  (1.753 m).   Weight as of this encounter: 290 lb (131.5 kg). Ideal: Ideal body weight: 66.2 kg (145 lb 15.1 oz) Adjusted ideal body weight: 92.3 kg (203 lb 9.1 oz)  Lumbar Spine Area Exam  Skin & Axial Inspection: No masses, redness, or swelling Alignment:  Symmetrical Functional ROM: Pain restricted ROM affecting primarily the right >left Stability: No instability detected Muscle Tone/Strength: Functionally intact. No obvious neuro-muscular anomalies detected. Sensory (Neurological): Musculoskeletal pain pattern right low back, hip, SI joint  Gait & Posture Assessment  Ambulation: Limited Gait: Antalgic Posture: Difficulty standing up straight, due to pain  Lower Extremity Exam    Side: Right lower extremity  Side: Left lower extremity  Stability: No instability observed          Stability: No instability observed          Skin & Extremity Inspection: Skin color, temperature, and hair growth are WNL. No peripheral edema or cyanosis. No masses, redness, swelling, asymmetry, or associated skin lesions. No contractures.  Skin & Extremity Inspection: Skin color, temperature, and hair growth are WNL. No peripheral edema or cyanosis. No masses, redness, swelling, asymmetry, or associated skin lesions. No contractures.  Functional ROM: Pain restricted ROM for hip and knee joints          Functional ROM: Unrestricted ROM                  Muscle Tone/Strength: Functionally intact. No obvious neuro-muscular anomalies detected.  Muscle Tone/Strength: Functionally intact. No obvious neuro-muscular anomalies detected.  Sensory (Neurological): Arthropathic arthralgia  Sensory (Neurological): Unimpaired        DTR: Patellar: deferred today Achilles: deferred today Plantar: deferred today  DTR: Patellar: deferred today Achilles: deferred today Plantar: deferred today  Palpation: No palpable anomalies  Palpation: No palpable anomalies    Assessment   Status Diagnosis  Persistent Persistent Persistent 1. SI joint arthritis   2. Piriformis syndrome of right side   3. Piriformis syndrome of left side   4. Sacroiliac joint pain   5. Chronic pain syndrome        Plan of Care  Erin Contreras has a current medication list which includes  the following long-term medication(s): orencia, albuterol, duloxetine, phentermine, and pregabalin.  Requested Prescriptions   Signed Prescriptions Disp Refills   DULoxetine (CYMBALTA) 20 MG capsule 60 capsule 2    Sig: Take 2 capsules (40 mg total) by mouth daily after breakfast.   pregabalin (LYRICA) 100 MG capsule 30 capsule 2    Sig: Take 1 capsule (100 mg total) by mouth at bedtime.   traMADol (ULTRAM) 50 MG tablet 90 tablet 2    Sig: Take 1 tablet (50 mg total) by mouth every 8 (eight) hours as needed for severe pain.     Follow-up plan:   Return in about 3 months (around 06/02/2022) for Medication Management, in person.    Recent Visits No visits were found meeting these conditions. Showing recent visits within past 90 days and meeting all other requirements Today's Visits Date Type Provider Dept  03/02/22 Office Visit Gillis Santa, MD [facility]  Showing today's visits and meeting all other requirements Future Appointments Date Type Provider Dept  05/25/22 Appointment Gillis Santa, MD [facility]  Showing future appointments within next 90 days and meeting all other requirements  I discussed the assessment and treatment plan with the patient. The patient was provided an opportunity to ask questions and all were answered. The patient agreed with the plan and demonstrated an understanding of the instructions.  Patient advised to call back or seek an in-person evaluation if the symptoms or condition worsens.  Duration of encounter: 30 minutes.  Note by: Gillis Santa, MD Date: 03/02/2022; Time: 1:11 PM

## 2022-03-04 ENCOUNTER — Ambulatory Visit: Payer: Self-pay

## 2022-03-04 NOTE — Telephone Encounter (Signed)
  Chief Complaint: moderate right lower abdominal pain Symptoms: back pain, fever, cold chills, diarrhea, nausea, increased urination frequency, cramping and diarrhea after eating, fatigue Frequency: 1/2 months Pertinent Negatives: Patient denies urination pain , vomiting Disposition: '[x]'$ ED /'[]'$ Urgent Care (no appt availability in office) / '[]'$ Appointment(In office/virtual)/ '[]'$  [place] [facility]/ '[]'$ Home Care/ '[]'$ Refused Recommended Disposition /'[]'$  Mobile Bus/ '[]'$  Follow-up with PCP Additional Notes: was initially going to make appt - no openings, advised U/C or ED then ultimately advised ER due to ability to do more thorough testing and due to pts length of time sick. Advised pt NPO and to call back for severe pain last > 1 hour, fever, or worsens. Reason for Disposition  Patient sounds very sick or weak to the triager  Answer Assessment - Initial Assessment Questions 1. LOCATION: "Where does it hurt?"      Lower right abdomen 2. RADIATION: "Does the pain shoot anywhere else?" (e.g., chest, back)     Back hurts more 3. ONSET: "When did the pain begin?" (e.g., minutes, hours or days ago)      1/1/2 month  4. SUDDEN: "Gradual or sudden onset?"     gradually 5. PATTERN "Does the pain come and go, or is it constant?"    - If it comes and goes: "How long does it last?" "Do you have pain now?"     (Note: Comes and goes means the pain is intermittent. It goes away completely between bouts.)    - If constant: "Is it getting better, staying the same, or getting worse?"      (Note: Constant means the pain never goes away completely; most serious pain is constant and gets worse.)      Comes and goes 6. SEVERITY: "How bad is the pain?"  (e.g., Scale 1-10; mild, moderate, or severe)    - MILD (1-3): Doesn't interfere with normal activities, abdomen soft and not tender to touch.     - MODERATE (4-7): Interferes with normal activities or awakens from sleep, abdomen tender to touch.     -  SEVERE (8-10): Excruciating pain, doubled over, unable to do any normal activities.       Moderate- cramping 7. RECURRENT SYMPTOM: "Have you ever had this type of stomach pain before?" If Yes, ask: "When was the last time?" and "What happened that time?"      no 8. CAUSE: "What do you think is causing the stomach pain?"     unsure 9. RELIEVING/AGGRAVATING FACTORS: "What makes it better or worse?" (e.g., antacids, bending or twisting motion, bowel movement)     no 10. OTHER SYMPTOMS: "Do you have any other symptoms?" (e.g., back pain, diarrhea, fever, urination pain, vomiting)       Back pain, fever, cold chills, diarrhea, nausea, increased frequency- diarrhea after eating cramping, fatigue  Protocols used: Abdominal Pain - Female-A-AH

## 2022-03-17 DIAGNOSIS — M170 Bilateral primary osteoarthritis of knee: Secondary | ICD-10-CM | POA: Diagnosis not present

## 2022-03-17 DIAGNOSIS — M0579 Rheumatoid arthritis with rheumatoid factor of multiple sites without organ or systems involvement: Secondary | ICD-10-CM | POA: Diagnosis not present

## 2022-03-17 DIAGNOSIS — Z7960 Long term (current) use of unspecified immunomodulators and immunosuppressants: Secondary | ICD-10-CM | POA: Diagnosis not present

## 2022-03-26 ENCOUNTER — Other Ambulatory Visit: Payer: Self-pay | Admitting: Student in an Organized Health Care Education/Training Program

## 2022-04-08 ENCOUNTER — Ambulatory Visit: Payer: Self-pay | Admitting: Cardiology

## 2022-04-09 ENCOUNTER — Other Ambulatory Visit (HOSPITAL_COMMUNITY)
Admission: RE | Admit: 2022-04-09 | Discharge: 2022-04-09 | Disposition: A | Discharge status: Home / Self Care | Payer: Medicaid Other | Source: Ambulatory Visit | Attending: Nurse Practitioner | Admitting: Nurse Practitioner

## 2022-04-09 ENCOUNTER — Encounter: Payer: Self-pay | Admitting: Nurse Practitioner

## 2022-04-09 ENCOUNTER — Ambulatory Visit (INDEPENDENT_AMBULATORY_CARE_PROVIDER_SITE_OTHER): Payer: Medicaid Other | Admitting: Nurse Practitioner

## 2022-04-09 VITALS — BP 113/80 | HR 91 | Temp 98.0°F | Ht 69.02 in | Wt 297.3 lb

## 2022-04-09 DIAGNOSIS — I10 Essential (primary) hypertension: Secondary | ICD-10-CM

## 2022-04-09 DIAGNOSIS — G894 Chronic pain syndrome: Secondary | ICD-10-CM

## 2022-04-09 DIAGNOSIS — E7800 Pure hypercholesterolemia, unspecified: Secondary | ICD-10-CM | POA: Diagnosis not present

## 2022-04-09 DIAGNOSIS — M0579 Rheumatoid arthritis with rheumatoid factor of multiple sites without organ or systems involvement: Secondary | ICD-10-CM | POA: Diagnosis not present

## 2022-04-09 DIAGNOSIS — Z124 Encounter for screening for malignant neoplasm of cervix: Secondary | ICD-10-CM

## 2022-04-09 DIAGNOSIS — E559 Vitamin D deficiency, unspecified: Secondary | ICD-10-CM | POA: Diagnosis not present

## 2022-04-09 DIAGNOSIS — R7989 Other specified abnormal findings of blood chemistry: Secondary | ICD-10-CM | POA: Diagnosis not present

## 2022-04-09 DIAGNOSIS — B009 Herpesviral infection, unspecified: Secondary | ICD-10-CM

## 2022-04-09 DIAGNOSIS — K2100 Gastro-esophageal reflux disease with esophagitis, without bleeding: Secondary | ICD-10-CM

## 2022-04-09 DIAGNOSIS — Z1231 Encounter for screening mammogram for malignant neoplasm of breast: Secondary | ICD-10-CM | POA: Diagnosis not present

## 2022-04-09 DIAGNOSIS — Z0000 Encounter for general adult medical examination without abnormal findings: Secondary | ICD-10-CM | POA: Diagnosis not present

## 2022-04-09 DIAGNOSIS — F321 Major depressive disorder, single episode, moderate: Secondary | ICD-10-CM | POA: Diagnosis not present

## 2022-04-09 DIAGNOSIS — K449 Diaphragmatic hernia without obstruction or gangrene: Secondary | ICD-10-CM | POA: Diagnosis not present

## 2022-04-09 DIAGNOSIS — R7303 Prediabetes: Secondary | ICD-10-CM | POA: Diagnosis not present

## 2022-04-09 DIAGNOSIS — Z6841 Body Mass Index (BMI) 40.0 and over, adult: Secondary | ICD-10-CM

## 2022-04-09 DIAGNOSIS — N898 Other specified noninflammatory disorders of vagina: Secondary | ICD-10-CM

## 2022-04-09 DIAGNOSIS — E538 Deficiency of other specified B group vitamins: Secondary | ICD-10-CM

## 2022-04-09 LAB — WET PREP FOR TRICH, YEAST, CLUE
Clue Cell Exam: NEGATIVE
Trichomonas Exam: NEGATIVE
Yeast Exam: NEGATIVE

## 2022-04-09 MED ORDER — ACYCLOVIR 400 MG PO TABS
400.0000 mg | ORAL_TABLET | Freq: Two times a day (BID) | ORAL | 4 refills | Status: AC | PRN
Start: 2022-04-09 — End: ?

## 2022-04-09 MED ORDER — PANTOPRAZOLE SODIUM 40 MG PO TBEC: 40.0000 mg | DELAYED_RELEASE_TABLET | Freq: Every day | ORAL | 4 refills | Status: DC

## 2022-04-09 NOTE — Assessment & Plan Note (Signed)
Noted on past labs, recheck TSH and Free T4 today -- consider endo referral if ongoing lows.

## 2022-04-09 NOTE — Assessment & Plan Note (Addendum)
Chronic, ongoing, followed by rheumatology and pain clinic.  Continue current treatment regimen as ordered by them.  Recent notes reviewed.

## 2022-04-09 NOTE — Assessment & Plan Note (Signed)
Chronic, stable without medication.  Recommend she monitor BP at least a few mornings a week at home and document.  DASH diet at home.  Continue current diet focus.  Labs today: CBC, CMP, TSH.  Return in 6 months.

## 2022-04-09 NOTE — Assessment & Plan Note (Signed)
Chronic, ongoing, followed by pain management.  Continue this collaboration and current treatment regimen as ordered by them.  Recent notes reviewed.

## 2022-04-09 NOTE — Assessment & Plan Note (Signed)
Chronic, ongoing.  At this time recommend she continue supplement daily 1000 MCG daily.  Educated her on this and benefit.

## 2022-04-09 NOTE — Assessment & Plan Note (Signed)
BMI 43.88.  Recommended eating smaller high protein, low fat meals more frequently and exercising 30 mins a day 5 times a week with a goal of 10-15lb weight loss in the next 3 months. Patient voiced their understanding and motivation to adhere to these recommendations.

## 2022-04-09 NOTE — Assessment & Plan Note (Addendum)
Ongoing.  Continue supplement and recommend ensure she is taking 2000 units daily.  Recheck today.

## 2022-04-09 NOTE — Assessment & Plan Note (Signed)
Chronic, stable with no recent flares.  Continue current medication regimen, refills sent.

## 2022-04-09 NOTE — Assessment & Plan Note (Signed)
Chronic, ongoing,  Continue Protonix at this time due to ongoing symptoms and hiatal hernia.  If worsening get into GI.  Risks of PPI use were discussed with patient including bone loss, C. Diff diarrhea, pneumonia, infections, CKD, electrolyte abnormalities.  Verbalizes understanding and chooses to continue the medication.

## 2022-04-09 NOTE — Assessment & Plan Note (Signed)
Ongoing, continue Duloxetine which benefits both mood and pain.  Denies SI/HI.  Discussed addition of therapy or Wellbutrin, at this time wishes to continue current regimen and will adjust as needed.  Return in 6 months.

## 2022-04-09 NOTE — Patient Instructions (Signed)
Atrophic Vaginitis  Atrophic vaginitis is when the lining of the vagina becomes dry and thin. This is most common in women who have stopped having their periods (are in menopause). It usually starts when a woman is 4 to 62 years old. What are the causes? This condition is caused by a drop in a female hormone (estrogen). What increases the risk? You are more likely to develop this condition if: You take certain medicines. You have had your ovaries taken out. You are being treated for cancer. You have given birth or are breastfeeding. You are more than 63 years old. You smoke. What are the signs or symptoms? Pain during sex. A feeling of pressure during sex. Bleeding during sex. Burning or itching in the vagina. Burning pain when you pee (urinate). Fluid coming from your vagina. Some people do not have symptoms. How is this treated? Using a lubricant before sex. Using a moisturizer in the vagina. Using estrogen in the vagina. In some cases, you may not need treatment. Follow these instructions at home: Medicines Take all medicines only as told by your doctor. This includes medicines for dryness. Do not use herbal medicines unless your doctor says it is okay. General instructions Talk with your doctor about treatment. Do not douche. Do not use scented: Sprays. Tampons. Soaps. If sex hurts, try using lubricants right before you have sex. Contact a doctor if: You have fluid coming from the vagina that is not like normal. You have a bad smell coming from your vagina. You have new symptoms. Your symptoms do not get better when treated. Your symptoms get worse. Summary This condition happens when the lining of the vagina becomes dry and thin. It is most common in women who no longer have periods. Treatment may include using medicines for dryness. Call a doctor if your symptoms do not get better. This information is not intended to replace advice given to you by your health  care provider. Make sure you discuss any questions you have with your health care provider. Document Revised: 01/17/2020 Document Reviewed: 01/17/2020 Elsevier Patient Education  [employer].

## 2022-04-09 NOTE — Assessment & Plan Note (Signed)
Noted on imaging 04/21/20 and suspect main cause of her ongoing heart burn.  Continue Protonix which offers some benefit and if worsening symptoms get into GI.  Risks of PPI use were discussed with patient including bone loss, C. Diff diarrhea, pneumonia, infections, CKD, electrolyte abnormalities.  Verbalizes understanding and chooses to continue the medication.

## 2022-04-09 NOTE — Progress Notes (Signed)
BP 113/80   Pulse 91   Temp 98 F (36.7 C) (Oral)   Ht 5' 9.02" (1.753 m)   Wt 297 lb 4.8 oz (134.9 kg)   SpO2 95%   BMI 43.88 kg/m    Subjective:    Patient ID: Erin Contreras, female    DOB: 05-20-1961, 61 y.o.   MRN: [id number]  HPI: Erin Contreras is a 61 y.o. female presenting on 04/09/2022 for comprehensive medical examination. Current medical complaints include:none  She currently lives with: self Menopausal Symptoms: no  The 10-year ASCVD risk score (Arnett DK, et al., 2019) is: 4%   Values used to calculate the score:     Age: 37 years     Sex: Female     Is Non-Hispanic African American: Yes     Diabetic: No     Tobacco smoker: No     Systolic Blood Pressure: 151 mmHg     Is BP treated: No     HDL Cholesterol: 73 mg/dL     Total Cholesterol: 203 mg/dL   HYPERTHYROIDISM Recent levels stable. Fatigue: yes Cold intolerance: yes Heat intolerance: no Weight gain: no Weight loss: no Constipation: yes Diarrhea/loose stools: yes Palpitations: no Lower extremity edema: no Anxiety/depressed mood:  occasional    GERD Would like refill on Protonix as continues to have heart burn and reports this worked well in ER.  ER also ordered referral to cardiology, she reports going to see this month. GERD control status: uncontrolled Satisfied with current treatment? yes Heartburn frequency: every day Medication side effects: no  Medication compliance: fluctuating Dysphagia: no Odynophagia:  no Hematemesis: no Blood in stool: no EGD: no   DEPRESSION Continues on Duloxetine.  She is followed by pain clinic and rheumatology for RA and chronic pain.  Taking Lyrica and Orencia.  Last visit with rheumatology was 03/17/22 and pain clinic 03/02/22. Mood status: stable Satisfied with current treatment?: yes Symptom severity: moderate  Duration of current treatment : chronic Side effects: no Medication compliance: good compliance Psychotherapy/counseling: none Depressed  mood: yes Anxious mood: no Anhedonia: no Significant weight loss or gain: no Insomnia: yes hard to fall asleep Fatigue: yes Feelings of worthlessness or guilt: yes Impaired concentration/indecisiveness: yes Suicidal ideations: no Hopelessness: yes Crying spells: yes    04/09/2022    1:42 PM 03/02/2022   11:04 AM 11/17/2021   11:10 AM 09/16/2021    2:38 PM 08/05/2021    2:45 PM  Depression screen PHQ 2/9  Decreased Interest 0 0 0 0 0  Down, Depressed, Hopeless 0 0 0 0 0  PHQ - 2 Score 0 0 0 0 0  Altered sleeping 3   0 1  Tired, decreased energy '3   3 2  '$ Change in appetite 0   0 0  Feeling bad or failure about yourself  0   0 0  Trouble concentrating 0   0 0  Moving slowly or fidgety/restless 2   0 0  Suicidal thoughts 0   0 0  PHQ-9 Score '8   3 3  '$ Difficult doing work/chores Not difficult at all    Not difficult at all       04/09/2022    1:42 PM 09/16/2021    2:38 PM 08/05/2021    2:46 PM 05/14/2021   10:35 AM  GAD 7 : Generalized Anxiety Score  Nervous, Anxious, on Edge 0 0 0 0  Control/stop worrying 0 0 0 0  Worry too much - different  things 0 1 0 0  Trouble relaxing 0 0 0 0  Restless 0 1 1 0  Easily annoyed or irritable 0 0 0 0  Afraid - awful might happen 0 0 0 0  Total GAD 7 Score 0 2 1 0  Anxiety Difficulty Not difficult at all Not difficult at all Not difficult at all Not difficult at all        11/17/2021   11:10 AM 01/05/2022   10:34 AM 03/02/2022   11:04 AM 04/09/2022    1:41 PM 04/09/2022    2:06 PM  [place] in the past year? 0  0 0 0  Was there an injury with Fall?    0 0  Fall Risk Category Calculator    0 0  Fall Risk Category    Low Low  Patient Fall Risk Level  Low fall risk   Low fall risk  Patient at Risk for Falls Due to    No Fall Risks No Fall Risks  Fall risk Follow up    Falls evaluation completed Education provided    Functional Status Survey: Is the patient deaf or have difficulty hearing?: No Does the patient have difficulty seeing,  even when wearing glasses/contacts?: No Does the patient have difficulty concentrating, remembering, or making decisions?: No Does the patient have difficulty walking or climbing stairs?: No Does the patient have difficulty dressing or bathing?: No Does the patient have difficulty doing errands alone such as visiting a doctor's office or shopping?: No    Past Medical History:  Past Medical History:  Diagnosis Date   Boils    Ear mass    GERD (gastroesophageal reflux disease)    Hemorrhoids    Herpes simplex virus infection    HSV infection    Lumbago with sciatica    OA (osteoarthritis) of knee    Obesity    Overactive bladder    PONV (postoperative nausea and vomiting)    Pre-diabetes    RA (rheumatoid arthritis) ([place])    Sleep apnea    does not use cpap-had bariatric surgery and has no issues since surgery   Urge and stress incontinence     Surgical History:  Past Surgical History:  Procedure Laterality Date   CHOLECYSTECTOMY  2011   COLONOSCOPY N/A 03/19/2021   Procedure: COLONOSCOPY;  Surgeon: Jonathon Bellows, MD;  [facility];  Service: Gastroenterology;  Laterality: N/A;   FRACTURE SURGERY Right    ankle as a child   JOINT REPLACEMENT Left 01/28/2021   KNEE ARTHROPLASTY Right 07/11/2017   Procedure: COMPUTER ASSISTED TOTAL KNEE ARTHROPLASTY;  Surgeon: Dereck Leep, MD;  Location: [facility] ORS;  Service: Orthopedics;  Laterality: Right;   KNEE ARTHROPLASTY Left 01/28/2021   Procedure: COMPUTER ASSISTED TOTAL KNEE ARTHROPLASTY;  Surgeon: Dereck Leep, MD;  Location: [facility] ORS;  Service: Orthopedics;  Laterality: Left;   SLEEVE GASTROPLASTY     TUMOR REMOVAL Right 2010   ear    Medications:  Current Outpatient Medications on File Prior to Visit  Medication Sig   abatacept (ORENCIA) 250 MG injection Inject into the vein every 30 (thirty) days.   albuterol (VENTOLIN HFA) 108 (90 Base) MCG/ACT inhaler Inhale 2 puffs into the lungs every 6 (six) hours as  needed for wheezing or shortness of breath.   Cholecalciferol 50 MCG (2000 UT) TABS Take 1 tablet by mouth daily.   cyanocobalamin (VITAMIN B12) 1000 MCG tablet Take 1,000 mcg by mouth daily.   DULoxetine (  CYMBALTA) 20 MG capsule Take 2 capsules (40 mg total) by mouth daily after breakfast.   pregabalin (LYRICA) 100 MG capsule Take 1 capsule (100 mg total) by mouth at bedtime.   No current facility-administered medications on file prior to visit.    Allergies:  No Known Allergies  Social History:  Social History   Socioeconomic History   Marital status: Single    Spouse name: Not on file   Number of children: Not on file   Years of education: Not on file   Highest education level: Not on file  Occupational History   Not on file  Tobacco Use   Smoking status: Never   Smokeless tobacco: Never  Vaping Use   Vaping Use: Never used  Substance and Sexual Activity   Alcohol use: No   Drug use: No   Sexual activity: Not Currently  Other Topics Concern   Not on file  Social History Narrative   Not on file   Social Determinants of Health   Financial Resource Strain: Not on file  Food Insecurity: Not on file  Transportation Needs: Not on file  Physical Activity: Not on file  Stress: Not on file  Social Connections: Not on file  Intimate Partner Violence: Not on file   Social History   Tobacco Use  Smoking Status Never  Smokeless Tobacco Never   Social History   Substance and Sexual Activity  Alcohol Use No    Family History:  Family History  Problem Relation Age of Onset   Cancer Mother        liver   Diabetes Mother    Hyperlipidemia Mother    Hypertension Mother    Cirrhosis Father        liver   Hypertension Daughter    Stroke Maternal Grandmother    Stroke Maternal Grandfather    Heart disease Paternal Grandmother    Diabetes Paternal Grandmother    Heart disease Paternal Grandfather    Diabetes Paternal Grandfather     Past medical history,  surgical history, medications, allergies, family history and social history reviewed with patient today and changes made to appropriate areas of the chart.   ROS All other ROS negative except what is listed above and in the HPI.      Objective:    BP 113/80   Pulse 91   Temp 98 F (36.7 C) (Oral)   Ht 5' 9.02" (1.753 m)   Wt 297 lb 4.8 oz (134.9 kg)   SpO2 95%   BMI 43.88 kg/m   Wt Readings from Last 3 Encounters:  04/09/22 297 lb 4.8 oz (134.9 kg)  03/02/22 290 lb (131.5 kg)  01/05/22 297 lb (134.7 kg)    Physical Exam Vitals and nursing note reviewed. Exam conducted with a chaperone present.  Constitutional:      General: She is awake. She is not in acute distress.    Appearance: She is well-developed. She is not ill-appearing.  HENT:     Head: Normocephalic and atraumatic.     Right Ear: Hearing, tympanic membrane, ear canal and external ear normal. No drainage.     Left Ear: Hearing, tympanic membrane, ear canal and external ear normal. No drainage.     Nose: Nose normal.     Right Sinus: No maxillary sinus tenderness or frontal sinus tenderness.     Left Sinus: No maxillary sinus tenderness or frontal sinus tenderness.     Mouth/Throat:     Mouth: Mucous membranes are  moist.     Pharynx: Oropharynx is clear. Uvula midline. No pharyngeal swelling, oropharyngeal exudate or posterior oropharyngeal erythema.  Eyes:     General: Lids are normal.        Right eye: No discharge.        Left eye: No discharge.     Extraocular Movements: Extraocular movements intact.     Conjunctiva/sclera: Conjunctivae normal.     Pupils: Pupils are equal, round, and reactive to light.     Visual Fields: Right eye visual fields normal and left eye visual fields normal.  Neck:     Thyroid: No thyromegaly.     Vascular: No carotid bruit.     Trachea: Trachea normal.  Cardiovascular:     Rate and Rhythm: Normal rate and regular rhythm.     Heart sounds: Normal heart sounds. No murmur  heard.    No gallop.  Pulmonary:     Effort: Pulmonary effort is normal. No accessory muscle usage or respiratory distress.     Breath sounds: Normal breath sounds.  Abdominal:     General: Bowel sounds are normal.     Palpations: Abdomen is soft. There is no hepatomegaly or splenomegaly.     Tenderness: There is no abdominal tenderness.     Hernia: A hernia is present. There is no hernia in the left inguinal area or right inguinal area.  Genitourinary:    Exam position: Lithotomy position.     Labia:        Right: No rash.        Left: No rash.      Urethra: No prolapse.     Vagina: Erythema (mild vaginal atrophy noted) present.     Cervix: Normal.     Uterus: Normal.      Adnexa: Right adnexa normal and left adnexa normal.     Comments: Cervix anterior and viewed with scant thick whitish discharge.  Pap and wet prep obtained. Musculoskeletal:        General: Normal range of motion.     Cervical back: Normal range of motion and neck supple.     Right lower leg: No edema.     Left lower leg: No edema.  Lymphadenopathy:     Head:     Right side of head: No submental, submandibular, tonsillar, preauricular or posterior auricular adenopathy.     Left side of head: No submental, submandibular, tonsillar, preauricular or posterior auricular adenopathy.     Cervical: No cervical adenopathy.  Skin:    General: Skin is warm and dry.     Capillary Refill: Capillary refill takes less than 2 seconds.     Findings: No rash.  Neurological:     Mental Status: She is alert and oriented to person, place, and time.     Gait: Gait is intact.     Deep Tendon Reflexes: Reflexes are normal and symmetric.     Reflex Scores:      Brachioradialis reflexes are 2+ on the right side and 2+ on the left side.      Patellar reflexes are 2+ on the right side and 2+ on the left side. Psychiatric:        Attention and Perception: Attention normal.        Mood and Affect: Mood normal.        Speech:  Speech normal.        Behavior: Behavior normal. Behavior is cooperative.        Thought Content:  Thought content normal.        Judgment: Judgment normal.    Results for orders placed or performed in visit on 04/09/22  WET PREP FOR [place], YEAST, CLUE   Specimen: Sterile Swab   Sterile Swab  Result Value Ref Range   Trichomonas Exam Negative Negative   Yeast Exam Negative Negative   Clue Cell Exam Negative Negative      Assessment & Plan:   Problem List Items Addressed This Visit       Cardiovascular and Mediastinum   Benign hypertension (Chronic)    Chronic, stable without medication.  Recommend she monitor BP at least a few mornings a week at home and document.  DASH diet at home.  Continue current diet focus.  Labs today: CBC, CMP, TSH.  Return in 6 months.        Respiratory   Hiatal hernia    Noted on imaging 04/21/20 and suspect main cause of her ongoing heart burn.  Continue Protonix which offers some benefit and if worsening symptoms get into GI.  Risks of PPI use were discussed with patient including bone loss, C. Diff diarrhea, pneumonia, infections, CKD, electrolyte abnormalities.  Verbalizes understanding and chooses to continue the medication.        Digestive   Gastroesophageal reflux disease with esophagitis (Chronic)    Chronic, ongoing,  Continue Protonix at this time due to ongoing symptoms and hiatal hernia.  If worsening get into GI.  Risks of PPI use were discussed with patient including bone loss, C. Diff diarrhea, pneumonia, infections, CKD, electrolyte abnormalities.  Verbalizes understanding and chooses to continue the medication.        Musculoskeletal and Integument   Rheumatoid arthritis involving multiple sites with positive rheumatoid factor (HCC) - Primary (Chronic)    Chronic, ongoing, followed by rheumatology and pain clinic.  Continue current treatment regimen as ordered by them.  Recent notes reviewed.      Relevant Orders   CBC with  Differential/Platelet   Comprehensive metabolic panel     Other   Prediabetes (Chronic)    A1c 6% last visit, recheck today.  Continue diet monitoring and initiate medication as needed.      Relevant Orders   Comprehensive metabolic panel   Lipid Panel w/o Chol/HDL Ratio   HgB A1c   B12 deficiency    Chronic, ongoing.  At this time recommend she continue supplement daily 1000 MCG daily.  Educated her on this and benefit.      Relevant Orders   Vitamin B12   Chronic pain syndrome    Chronic, ongoing, followed by pain management.  Continue this collaboration and current treatment regimen as ordered by them.  Recent notes reviewed.        Depression, major, single episode, moderate (HCC)    Ongoing, continue Duloxetine which benefits both mood and pain.  Denies SI/HI.  Discussed addition of therapy or Wellbutrin, at this time wishes to continue current regimen and will adjust as needed.  Return in 6 months.      Elevated low density lipoprotein (LDL) cholesterol level    Chronic, ongoing -- noted on labs, ASCVD 4% and last LDL <190.  Continue diet focus at home. Lipid panel today.      Herpes simplex virus infection    Chronic, stable with no recent flares.  Continue current medication regimen, refills sent.      Relevant Medications   acyclovir (ZOVIRAX) 400 MG tablet   Low serum thyroid stimulating  hormone (TSH)    Noted on past labs, recheck TSH and Free T4 today -- consider endo referral if ongoing lows.      Relevant Orders   TSH   T4, free   Obesity    BMI 43.88.  Recommended eating smaller high protein, low fat meals more frequently and exercising 30 mins a day 5 times a week with a goal of 10-15lb weight loss in the next 3 months. Patient voiced their understanding and motivation to adhere to these recommendations.       Vitamin D deficiency    Ongoing.  Continue supplement and recommend ensure she is taking 2000 units daily.  Recheck today.      Relevant  Orders   VITAMIN D 25 Hydroxy (Vit-D Deficiency, Fractures)   Other Visit Diagnoses     Vaginal discharge       Wet prep negative on labs today.  Discussed with patient.   Relevant Orders   WET PREP FOR [place], [place] (Completed)   Cervical cancer screening       Pap obtained today and sent to lab, history of HPV + testing.   Relevant Orders   Cytology - PAP   Encounter for screening mammogram for malignant neoplasm of breast       Mammogram ordered today and recommend she call and schedule.   Relevant Orders   MM 3D SCREEN BREAST BILATERAL   Encounter for annual physical exam       Annual physical with labs today and health maintenance reviewed with patient.        Follow up plan: Return in about 6 months (around 10/08/2022).   LABORATORY TESTING:  - Pap smear: pap done  IMMUNIZATIONS:   - Tdap: Tetanus vaccination status reviewed: last tetanus booster within 10 years. - Influenza: Refused -- will obtain in a couple months - Pneumovax: Not applicable - Prevnar: Not applicable - COVID: Up to date - HPV: Not applicable - Shingrix vaccine: Refused  SCREENING: -Mammogram: Ordered today  - Colonoscopy: Up to date  - Bone Density: Not applicable  -Hearing Test: Not applicable  -Spirometry: Not applicable   PATIENT COUNSELING:   Advised to take 1 mg of folate supplement per day if capable of pregnancy.   Sexuality: Discussed sexually transmitted diseases, partner selection, use of condoms, avoidance of unintended pregnancy  and contraceptive alternatives.   Advised to avoid cigarette smoking.  I discussed with the patient that most people either abstain from alcohol or drink within safe limits (<=14/week and <=4 drinks/occasion for males, <=7/weeks and <= 3 drinks/occasion for females) and that the risk for alcohol disorders and other health effects rises proportionally with the number of drinks per week and how often a drinker exceeds daily limits.  Discussed  cessation/primary prevention of drug use and availability of treatment for abuse.   Diet: Encouraged to adjust caloric intake to maintain  or achieve ideal body weight, to reduce intake of dietary saturated fat and total fat, to limit sodium intake by avoiding high sodium foods and not adding table salt, and to maintain adequate dietary potassium and calcium preferably from fresh fruits, vegetables, and low-fat dairy products.    Stressed the importance of regular exercise  Injury prevention: Discussed safety belts, safety helmets, smoke detector, smoking near bedding or upholstery.   Dental health: Discussed importance of regular tooth brushing, flossing, and dental visits.    NEXT PREVENTATIVE PHYSICAL DUE IN 1 YEAR. Return in about 6 months (around 10/08/2022).

## 2022-04-09 NOTE — Assessment & Plan Note (Signed)
A1c 6% last visit, recheck today.  Continue diet monitoring and initiate medication as needed.

## 2022-04-09 NOTE — Assessment & Plan Note (Signed)
Chronic, ongoing -- noted on labs, ASCVD 4% and last LDL <190.  Continue diet focus at home. Lipid panel today.

## 2022-04-10 LAB — COMPREHENSIVE METABOLIC PANEL
ALT: 26 IU/L (ref 0–32)
AST: 22 IU/L (ref 0–40)
Albumin/Globulin Ratio: 1.3 (ref 1.2–2.2)
Albumin: 4.1 g/dL (ref 3.9–4.9)
Alkaline Phosphatase: 117 IU/L (ref 44–121)
BUN/Creatinine Ratio: 19 (ref 12–28)
BUN: 20 mg/dL (ref 8–27)
Bilirubin Total: 0.2 mg/dL (ref 0.0–1.2)
CO2: 22 mmol/L (ref 20–29)
Calcium: 9.6 mg/dL (ref 8.7–10.3)
Chloride: 102 mmol/L (ref 96–106)
Creatinine, Ser: 1.03 mg/dL — ABNORMAL HIGH (ref 0.57–1.00)
Globulin, Total: 3.1 g/dL (ref 1.5–4.5)
Glucose: 93 mg/dL (ref 70–99)
Potassium: 4.7 mmol/L (ref 3.5–5.2)
Sodium: 138 mmol/L (ref 134–144)
Total Protein: 7.2 g/dL (ref 6.0–8.5)
eGFR: 61 mL/min/{1.73_m2} (ref >59)

## 2022-04-10 LAB — CBC WITH DIFFERENTIAL/PLATELET
Basophils Absolute: 0 10*3/uL (ref 0.0–0.2)
Basos: 1 %
EOS (ABSOLUTE): 0.2 10*3/uL (ref 0.0–0.4)
Eos: 3 %
Hematocrit: 44.6 % (ref 34.0–46.6)
Hemoglobin: 14 g/dL (ref 11.1–15.9)
Immature Grans (Abs): 0 10*3/uL (ref 0.0–0.1)
Immature Granulocytes: 0 %
Lymphocytes Absolute: 1.4 10*3/uL (ref 0.7–3.1)
Lymphs: 16 %
MCH: 27.2 pg (ref 26.6–33.0)
MCHC: 31.4 g/dL — ABNORMAL LOW (ref 31.5–35.7)
MCV: 87 fL (ref 79–97)
Monocytes Absolute: 0.5 10*3/uL (ref 0.1–0.9)
Monocytes: 5 %
Neutrophils Absolute: 6.5 10*3/uL (ref 1.4–7.0)
Neutrophils: 75 %
Platelets: 286 10*3/uL (ref 150–450)
RBC: 5.15 x10E6/uL (ref 3.77–5.28)
RDW: 14.3 % (ref 11.7–15.4)
WBC: 8.7 10*3/uL (ref 3.4–10.8)

## 2022-04-10 LAB — LIPID PANEL W/O CHOL/HDL RATIO
Cholesterol, Total: 242 mg/dL — ABNORMAL HIGH (ref 100–199)
HDL: 78 mg/dL (ref >39)
LDL Chol Calc (NIH): 140 mg/dL — ABNORMAL HIGH (ref 0–99)
Triglycerides: 140 mg/dL (ref 0–149)
VLDL Cholesterol Cal: 24 mg/dL (ref 5–40)

## 2022-04-10 LAB — VITAMIN B12: Vitamin B-12: 643 pg/mL (ref 232–1245)

## 2022-04-10 LAB — T4, FREE: Free T4: 1.04 ng/dL (ref 0.82–1.77)

## 2022-04-10 LAB — HEMOGLOBIN A1C
Est. average glucose Bld gHb Est-mCnc: 131 mg/dL
Hgb A1c MFr Bld: 6.2 % — ABNORMAL HIGH (ref 4.8–5.6)

## 2022-04-10 LAB — VITAMIN D 25 HYDROXY (VIT D DEFICIENCY, FRACTURES): Vit D, 25-Hydroxy: 21.7 ng/mL — ABNORMAL LOW (ref 30.0–100.0)

## 2022-04-10 LAB — TSH: TSH: 0.585 u[IU]/mL (ref 0.450–4.500)

## 2022-04-10 NOTE — Progress Notes (Signed)
Good morning, please let Alisandra know labs have returned: - Kidney function, creatinine and eGFR, remains normal, as is liver function, AST and ALT.  - Cholesterol labs are elevated, but continued recommendation to work on diet changes at home + regular activity. - Vitamin D level remains a little on low side, recommend she ensure she is taking Vitamin D3 2000 units daily for overall bone health. - A1c continues to show levels in prediabetic range, these have trended up a little -- please ensure plenty of focus on diet and regular activity at home. - Remainder of labs are normal.  Any questions? Keep being amazing!!  Thank you for allowing me to participate in your care.  I appreciate you. Kindest regards,    

## 2022-04-16 LAB — CYTOLOGY - PAP
Comment: NEGATIVE
Diagnosis: NEGATIVE
High risk HPV: NEGATIVE

## 2022-04-16 NOTE — Progress Notes (Signed)
Good morning, please let Erin Contreras know her pap has returned and is negative on cytology and HPV testing!!  Great news.  There is some vaginal atrophy as we discussed, this is common with aging and I do recommend if you become sexually active to ensure you use lubrication for comfort due to this.  Any questions?  We will repeat in 3 years and if this is normal then stop.   Keep being amazing!!  Thank you for allowing me to participate in your care.  I appreciate you. Kindest regards, Oneda Duffett

## 2022-04-30 ENCOUNTER — Ambulatory Visit: Payer: Medicaid Other | Attending: Cardiology | Admitting: Cardiology

## 2022-04-30 ENCOUNTER — Encounter: Payer: Self-pay | Admitting: Cardiology

## 2022-04-30 VITALS — BP 117/84 | HR 80 | Ht 69.0 in | Wt 305.6 lb

## 2022-04-30 DIAGNOSIS — R079 Chest pain, unspecified: Secondary | ICD-10-CM

## 2022-04-30 MED ORDER — PANTOPRAZOLE SODIUM 40 MG PO TBEC: 40.0000 mg | DELAYED_RELEASE_TABLET | Freq: Two times a day (BID) | ORAL | 5 refills | Status: DC

## 2022-04-30 NOTE — Progress Notes (Signed)
Cardiology Office Note:    Date:  04/30/2022   ID:  Erin Contreras, DOB January 28, 1961, MRN [id number]  PCP:  Venita Lick, NP   [place] Providers Cardiologist:  None     Referring MD: Venita Lick, NP   Chief Complaint  Patient presents with   New Patient (Initial Visit)    Chest pain, slight swelling in legs    History of Present Illness:    Erin Contreras is a 61 y.o. female with a hx of GERD, obesity, rheumatoid arthritis, chronic pain syndrome who presents due to chest pain.   Patient had an episode of chest pain about 2-3 months ago, symptoms were not associated with exertion.  Describes pain as sharp, located on the left side.  Presented to the ED where work-up was unrevealing.  She was started on Protonix with improvement of pain, advised to follow-up with cardiology.  Has not had any episodes since, takes Protonix 40 mg daily which has improved symptoms although she still has it occasionally.  She has a history of obesity s/p weight loss/sleeve surgery.  Denies any history of heart disease or heart attacks.  Denies smoking.    Past Medical History:  Diagnosis Date   Boils    Ear mass    GERD (gastroesophageal reflux disease)    Hemorrhoids    Herpes simplex virus infection    HSV infection    Lumbago with sciatica    OA (osteoarthritis) of knee    Obesity    Overactive bladder    PONV (postoperative nausea and vomiting)    Pre-diabetes    RA (rheumatoid arthritis) ([place])    Sleep apnea    does not use cpap-had bariatric surgery and has no issues since surgery   Urge and stress incontinence     Past Surgical History:  Procedure Laterality Date   CHOLECYSTECTOMY  2011   COLONOSCOPY N/A 03/19/2021   Procedure: COLONOSCOPY;  Surgeon: Jonathon Bellows, MD;  Location: [facility] ENDOSCOPY;  Service: Gastroenterology;  Laterality: N/A;   FRACTURE SURGERY Right    ankle as a child   JOINT REPLACEMENT Left 01/28/2021   KNEE ARTHROPLASTY Right 07/11/2017    Procedure: COMPUTER ASSISTED TOTAL KNEE ARTHROPLASTY;  Surgeon: Dereck Leep, MD;  Location: [facility] ORS;  Service: Orthopedics;  Laterality: Right;   KNEE ARTHROPLASTY Left 01/28/2021   Procedure: COMPUTER ASSISTED TOTAL KNEE ARTHROPLASTY;  Surgeon: Dereck Leep, MD;  Location: [facility] ORS;  Service: Orthopedics;  Laterality: Left;   SLEEVE GASTROPLASTY     TUMOR REMOVAL Right 2010   ear    Current Medications: Current Meds  Medication Sig   abatacept (ORENCIA) 250 MG injection Inject into the vein every 30 (thirty) days.   acyclovir (ZOVIRAX) 400 MG tablet Take 1 tablet (400 mg total) by mouth 2 (two) times daily as needed.   albuterol (VENTOLIN HFA) 108 (90 Base) MCG/ACT inhaler Inhale 2 puffs into the lungs every 6 (six) hours as needed for wheezing or shortness of breath.   Cholecalciferol 50 MCG (2000 UT) TABS Take 1 tablet by mouth daily.   cyanocobalamin (VITAMIN B12) 1000 MCG tablet Take 1,000 mcg by mouth daily.   DULoxetine (CYMBALTA) 20 MG capsule Take 2 capsules (40 mg total) by mouth daily after breakfast.   pregabalin (LYRICA) 100 MG capsule Take 1 capsule (100 mg total) by mouth at bedtime.   [DISCONTINUED] pantoprazole (PROTONIX) 40 MG tablet Take 1 tablet (40 mg total) by mouth daily.  Allergies:   Patient has no known allergies.   Social History   Socioeconomic History   Marital status: Single    Spouse name: Not on file   Number of children: Not on file   Years of education: Not on file   Highest education level: Not on file  Occupational History   Not on file  Tobacco Use   Smoking status: Never   Smokeless tobacco: Never  Vaping Use   Vaping Use: Never used  Substance and Sexual Activity   Alcohol use: No   Drug use: No   Sexual activity: Not Currently  Other Topics Concern   Not on file  Social History Narrative   Not on file   Social Determinants of Health   Financial Resource Strain: Not on file  Food Insecurity: Not on file   Transportation Needs: Not on file  Physical Activity: Not on file  Stress: Not on file  Social Connections: Not on file     Family History: The patient's family history includes Cancer in her mother; Cirrhosis in her father; Diabetes in her mother, paternal grandfather, and paternal grandmother; Heart disease in her paternal grandfather and paternal grandmother; Hyperlipidemia in her mother; Hypertension in her daughter and mother; Stroke in her maternal grandfather and maternal grandmother.  ROS:   Please see the history of present illness.     All other systems reviewed and are negative.  EKGs/Labs/Other Studies Reviewed:    The following studies were reviewed today:   EKG:  EKG is  ordered today.  The ekg ordered today demonstrates normal sinus rhythm, normal ECG  Recent Labs: 04/09/2022: ALT 26; BUN 20; Creatinine, Ser 1.03; Hemoglobin 14.0; Platelets 286; Potassium 4.7; Sodium 138; TSH 0.585  Recent Lipid Panel    Component Value Date/Time   CHOL 242 (H) 04/09/2022 1452   TRIG 140 04/09/2022 1452   HDL 78 04/09/2022 1452   LDLCALC 140 (H) 04/09/2022 1452     Risk Assessment/Calculations:              Physical Exam:    VS:  BP 117/84 (BP Location: Right Arm, Patient Position: Sitting, Cuff Size: Normal)   Pulse 80   Ht '5\' 9"'$  (1.753 m)   Wt (!) 305 lb 9.6 oz (138.6 kg)   SpO2 95%   BMI 45.13 kg/m     Wt Readings from Last 3 Encounters:  04/30/22 (!) 305 lb 9.6 oz (138.6 kg)  04/09/22 297 lb 4.8 oz (134.9 kg)  03/02/22 290 lb (131.5 kg)     GEN:  Well nourished, well developed in no acute distress HEENT: Normal NECK: No JVD; No carotid bruits CARDIAC: RRR, no murmurs, rubs, gallops RESPIRATORY:  Clear to auscultation without rales, wheezing or rhonchi  ABDOMEN: Soft, non-tender, non-distended MUSCULOSKELETAL:  No edema; No deformity  SKIN: Warm and dry NEUROLOGIC:  Alert and oriented x 3 PSYCHIATRIC:  Normal affect   ASSESSMENT:    1. Chest pain of  uncertain etiology   2. Morbid obesity ([place])    PLAN:    In order of problems listed above:  Chest pain, symptoms not consistent with angina, likely GERD related, okay to increase Protonix to 40 mg twice daily.  Obtain echo to rule out any structural abnormalities.  If echo normal will reassure patient. Morbid obesity, continue low calorie diet.  Follow-up based on echo results.  Follow-up as needed if echo is normal.       Medication Adjustments/Labs and Tests Ordered: Current medicines  are reviewed at length with the patient today.  Concerns regarding medicines are outlined above.  Orders Placed This Encounter  Procedures   EKG 12-Lead   ECHOCARDIOGRAM COMPLETE   Meds ordered this encounter  Medications   pantoprazole (PROTONIX) 40 MG tablet    Sig: Take 1 tablet (40 mg total) by mouth 2 (two) times daily.    Dispense:  60 tablet    Refill:  5    Patient Instructions  Medication Instructions:   Your physician has recommended you make the following change in your medication:    INCREASE your Protonix to 40 MG twice a day.  *If you need a refill on your cardiac medications before your next appointment, please call your pharmacy*    Testing/Procedures:  Your physician has requested that you have an echocardiogram. Echocardiography is a painless test that uses sound waves to create images of your heart. It provides your doctor with information about the size and shape of your heart and how well your heart's chambers and valves are working. This procedure takes approximately one hour. There are no restrictions for this procedure.    Follow-Up: At [facility], you and your health needs are our priority.  As part of our continuing mission to provide you with exceptional heart care, we have created designated Provider Care Teams.  These Care Teams include your primary Cardiologist (physician) and Advanced Practice Providers (APPs -  Physician Assistants and Nurse  Practitioners) who all work together to provide you with the care you need, when you need it.  We recommend signing up for the patient portal called "MyChart".  Sign up information is provided on this After Visit Summary.  MyChart is used to connect with patients for Virtual Visits (Telemedicine).  Patients are able to view lab/test results, encounter notes, upcoming appointments, etc.  Non-urgent messages can be sent to your provider as well.   To learn more about what you can do with MyChart, go to [email or web address].    Your next appointment:   Follow up as needed   The format for your next appointment:   In Person  Provider:   You may see Kate Sable, MD or one of the following Advanced Practice Providers on your designated Care Team:   Murray Hodgkins, NP Christell Faith, PA-C Cadence Kathlen Mody, PA-C Gerrie Nordmann, NP    Other Instructions   Important Information About Sugar         Signed, Kate Sable, MD  04/30/2022 4:06 PM    [place]

## 2022-04-30 NOTE — Patient Instructions (Signed)
Medication Instructions:   Your physician has recommended you make the following change in your medication:    INCREASE your Protonix to 40 MG twice a day.  *If you need a refill on your cardiac medications before your next appointment, please call your pharmacy*    Testing/Procedures:  Your physician has requested that you have an echocardiogram. Echocardiography is a painless test that uses sound waves to create images of your heart. It provides your doctor with information about the size and shape of your heart and how well your heart's chambers and valves are working. This procedure takes approximately one hour. There are no restrictions for this procedure.    Follow-Up: At [facility], you and your health needs are our priority.  As part of our continuing mission to provide you with exceptional heart care, we have created designated Provider Care Teams.  These Care Teams include your primary Cardiologist (physician) and Advanced Practice Providers (APPs -  Physician Assistants and Nurse Practitioners) who all work together to provide you with the care you need, when you need it.  We recommend signing up for the patient portal called "MyChart".  Sign up information is provided on this After Visit Summary.  MyChart is used to connect with patients for Virtual Visits (Telemedicine).  Patients are able to view lab/test results, encounter notes, upcoming appointments, etc.  Non-urgent messages can be sent to your provider as well.   To learn more about what you can do with MyChart, go to [email or web address].    Your next appointment:   Follow up as needed   The format for your next appointment:   In Person  Provider:   You may see Kate Sable, MD or one of the following Advanced Practice Providers on your designated Care Team:   Murray Hodgkins, NP Christell Faith, PA-C Cadence Kathlen Mody, PA-C Gerrie Nordmann, NP    Other Instructions   Important Information About  Sugar

## 2022-05-24 ENCOUNTER — Telehealth: Payer: Self-pay | Admitting: Nurse Practitioner

## 2022-05-24 ENCOUNTER — Ambulatory Visit: Payer: Self-pay | Admitting: *Deleted

## 2022-05-24 DIAGNOSIS — M0579 Rheumatoid arthritis with rheumatoid factor of multiple sites without organ or systems involvement: Secondary | ICD-10-CM

## 2022-05-24 NOTE — Telephone Encounter (Signed)
Copied from [place] [id number]. Topic: Referral - Request for Referral >> May 24, 2022  9:51 AM Erin Contreras wrote: Has patient seen PCP for this complaint? N/A Pt is unsure if she has spoken to PCP about this or not.  *If NO, is insurance requiring patient see PCP for this issue before PCP can refer them? Referral for which specialty: Rheumatologist (Rheumatoid Arthritis) Preferred provider/office: N/A Reason for referral: Pt stated she needs a referral because her previous Rheumatologist older Dr. Salvatore Marvel K. Posey Pronto, Erin Contreras will no longer see her because of insurance.  Pt is requesting someone who accepts her insurance.

## 2022-05-24 NOTE — Telephone Encounter (Signed)
  Chief Complaint: requesting appt for worsening RA pain  Symptoms: pain worsening in hands, wrists, elbows, shoulders . Reports medication changed due to insurance and can no longer see rheumatoid specialist. Headaches on and off. No fever. Trouble lifting left hand due to pain at times. Reports she is no longer getting "shot" due to insurance and pain is not managed.  Frequency: 6-8 weeks Pertinent Negatives: Patient denies chest pain no difficulty breathing no fever, no visual issues, no weakness on one side of body . Disposition: '[]'$ ED /'[]'$ Urgent Care (no appt availability in office) / '[x]'$ Appointment(In office/virtual)/ '[]'$  [facility] [place] [facility]/ '[]'$ Home Care/ '[]'$ Refused Recommended Disposition /'[]'$ [place] [facility]/ '[]'$  Follow-up with PCP Additional Notes:   Appt tomorrow.  Reason for Disposition  [1] MODERATE pain (e.g., interferes with normal activities) AND [2] present > 3 days  Answer Assessment - Initial Assessment Questions 1. ONSET: "When did the muscle aches or body pains start?"      6- 8 weeks  2. LOCATION: "What part of your body is hurting?" (e.g., entire body, arms, legs)      Hands, elbows, shoulders, wrists left hand difficulty lifting at times  3. SEVERITY: "How bad is the pain?" (Scale 1-10; or mild, moderate, severe)   - MILD (1-3): doesn't interfere with normal activities    - MODERATE (4-7): interferes with normal activities or awakens from sleep    - SEVERE (8-10):  excruciating pain, unable to do any normal activities      Severe at times. 4. CAUSE: "What do you think is causing the pains?"     Medication changed and no longer getting "shot" for RA due to insurance  5. FEVER: "Have you been having fever?"     Not now  6. OTHER SYMPTOMS: "Do you have any other symptoms?" (e.g., chest pain, weakness, rash, cold or flu symptoms, weight loss)     Tired , trouble lifting left hand due to pain at times. Headache on and off. Denies visual issues. No weakness on one side  of body  7. PREGNANCY: "Is there any chance you are pregnant?" "When was your last menstrual period?"     na 8. TRAVEL: "Have you traveled out of the country in the last month?" (e.g., travel history, exposures)     na  Protocols used: Muscle Aches and Body Pain-A-AH

## 2022-05-25 ENCOUNTER — Ambulatory Visit (INDEPENDENT_AMBULATORY_CARE_PROVIDER_SITE_OTHER): Payer: Medicaid Other | Admitting: Nurse Practitioner

## 2022-05-25 ENCOUNTER — Encounter: Payer: Self-pay | Admitting: Nurse Practitioner

## 2022-05-25 ENCOUNTER — Other Ambulatory Visit: Payer: Self-pay | Admitting: Nurse Practitioner

## 2022-05-25 ENCOUNTER — Ambulatory Visit
Payer: Medicaid Other | Attending: Student in an Organized Health Care Education/Training Program | Admitting: Student in an Organized Health Care Education/Training Program

## 2022-05-25 ENCOUNTER — Telehealth: Payer: Self-pay

## 2022-05-25 ENCOUNTER — Encounter: Payer: Self-pay | Admitting: Student in an Organized Health Care Education/Training Program

## 2022-05-25 VITALS — BP 128/75 | HR 83 | Temp 97.4°F | Resp 17 | Ht 69.0 in | Wt 303.0 lb

## 2022-05-25 VITALS — BP 139/77 | HR 83 | Temp 97.9°F | Wt 303.8 lb

## 2022-05-25 DIAGNOSIS — M62838 Other muscle spasm: Secondary | ICD-10-CM | POA: Diagnosis not present

## 2022-05-25 DIAGNOSIS — M47818 Spondylosis without myelopathy or radiculopathy, sacral and sacrococcygeal region: Secondary | ICD-10-CM | POA: Insufficient documentation

## 2022-05-25 DIAGNOSIS — G5701 Lesion of sciatic nerve, right lower limb: Secondary | ICD-10-CM | POA: Diagnosis not present

## 2022-05-25 DIAGNOSIS — Z23 Encounter for immunization: Secondary | ICD-10-CM | POA: Diagnosis not present

## 2022-05-25 DIAGNOSIS — M792 Neuralgia and neuritis, unspecified: Secondary | ICD-10-CM | POA: Diagnosis not present

## 2022-05-25 DIAGNOSIS — M533 Sacrococcygeal disorders, not elsewhere classified: Secondary | ICD-10-CM | POA: Insufficient documentation

## 2022-05-25 DIAGNOSIS — N898 Other specified noninflammatory disorders of vagina: Secondary | ICD-10-CM | POA: Diagnosis not present

## 2022-05-25 DIAGNOSIS — G894 Chronic pain syndrome: Secondary | ICD-10-CM | POA: Insufficient documentation

## 2022-05-25 DIAGNOSIS — G5702 Lesion of sciatic nerve, left lower limb: Secondary | ICD-10-CM | POA: Insufficient documentation

## 2022-05-25 DIAGNOSIS — M0579 Rheumatoid arthritis with rheumatoid factor of multiple sites without organ or systems involvement: Secondary | ICD-10-CM | POA: Diagnosis not present

## 2022-05-25 LAB — URINALYSIS, ROUTINE W REFLEX MICROSCOPIC
Bilirubin, UA: NEGATIVE
Glucose, UA: NEGATIVE
Ketones, UA: NEGATIVE
Leukocytes,UA: NEGATIVE
Nitrite, UA: NEGATIVE
RBC, UA: NEGATIVE
Specific Gravity, UA: >1.03 — ABNORMAL HIGH (ref 1.005–1.030)
Urobilinogen, Ur: 0.2 mg/dL (ref 0.2–1.0)
pH, UA: 5.5 (ref 5.0–7.5)

## 2022-05-25 LAB — WET PREP FOR TRICH, YEAST, CLUE
Clue Cell Exam: POSITIVE — AB
Trichomonas Exam: NEGATIVE
Yeast Exam: NEGATIVE

## 2022-05-25 MED ORDER — BELBUCA 150 MCG BU FILM: 1.0000 | ORAL_FILM | Freq: Two times a day (BID) | BUCCAL | 0 refills | Status: AC

## 2022-05-25 MED ORDER — METRONIDAZOLE 500 MG PO TABS: 500.0000 mg | ORAL_TABLET | Freq: Two times a day (BID) | ORAL | 0 refills | Status: AC

## 2022-05-25 MED ORDER — BELBUCA 300 MCG BU FILM: 1.0000 | ORAL_FILM | Freq: Two times a day (BID) | BUCCAL | 0 refills | Status: DC

## 2022-05-25 MED ORDER — PREGABALIN 100 MG PO CAPS: 100.0000 mg | ORAL_CAPSULE | Freq: Every day | ORAL | 2 refills | Status: DC

## 2022-05-25 MED ORDER — DULOXETINE HCL 20 MG PO CPEP: 40.0000 mg | ORAL_CAPSULE | Freq: Every day | ORAL | 2 refills | Status: DC

## 2022-05-25 NOTE — Patient Instructions (Signed)
Recommend Magnesium [phone] mg qhs

## 2022-05-25 NOTE — Assessment & Plan Note (Signed)
Chronic, ongoing, followed by rheumatology and pain clinic.  Currently is in between rheumatology clinics due to insurance, have provided her phone number for [facility] clinic to call and get scheduled.  At this time recommend she continue Rinvoq until she sees them -- may need to return to Methotrexate and infusions which offered more benefit in past. Labs today: CBC, CMP, TSH.

## 2022-05-25 NOTE — Patient Instructions (Signed)
Rheumatology =  Address: [address], [place], [place] [address] Phone: [phone]  Rheumatoid Arthritis Rheumatoid arthritis (RA) is a long-term (chronic) disease. RA causes inflammation in your joints. Your joints may feel painful, stiff, swollen, and warm. RA may start slowly. It most often affects the small joints of the hands and feet. It can also affect other parts of the body. Symptoms of RA often come and go. There is no cure for RA, but medicines can help your symptoms. What are the causes? RA is an autoimmune disease. This means that your body's defense system (immune system) attacks healthy parts of your body by mistake. The exact cause of RA is not known. What increases the risk? Being female. Having a family history of RA or other diseases like RA. Smoking. Being very overweight (obese). Being exposed to pollutants or chemicals. What are the signs or symptoms? Symptoms start slowly. They are often worse in the morning. The first symptom is often morning stiffness that lasts longer than 30 minutes. As RA gets worse, symptoms may include: Pain, stiffness, swelling, warmth, and tenderness in joints on both sides of your body. Loss of energy. Not wanting to eat as much as normal. Weight loss. A low fever. Dry eyes and a dry mouth. Firm lumps that grow under your skin. Changes in the way your joints look or the way they work. Symptoms vary and they often come and go. Symptoms sometimes get worse for a period of time. These are called flares. How is this treated? Treatment may include: Taking good care of yourself. Be sure to rest as needed, eat a healthy diet, and exercise. Medicines. These may include: Pain relievers. Medicines to help with inflammation. Disease-modifying antirheumatic drugs (DMARDs). Medicines called biologic response modifiers. Physical therapy and occupational therapy. Surgery, if joint damage is very bad. Your doctor will work with you to  find the best treatments. Follow these instructions at home: Managing pain, stiffness, and swelling If told, put heat on the affected area. Do this as often as told by your doctor. Use the heat source that your doctor recommends, such as a moist heat pack or a heating pad. Place a towel between your skin and the heat source. Leave the heat on for 20-30 minutes. Take off the heat if your skin turns bright red. This is very important. If you cannot feel pain, heat, or cold, you have a greater risk of getting burned.  Activity Return to your normal activities when your doctor says that it is safe. Rest when you have a flare. Exercise as told by your doctor. This can help your joints move better and get stronger. General instructions Take over-the-counter and prescription medicines only as told by your doctor. Keep all follow-up visits. Where to find more information [employer] of Rheumatology: rheumatology.[place]: arthritis.org Contact a doctor if: You have a flare. You have a fever. You have problems because of your medicines. Get help right away if: You have chest pain. You have trouble breathing. You get a hot, painful joint all of a sudden, and it is worse than your normal joint aches. These symptoms may be an emergency. Get help right away. Call 911. Do not wait to see if the symptoms will go away. Do not drive yourself to the hospital. Summary RA is a long-term disease. RA causes inflammation in your joints. Symptoms of RA start slowly. They are often worse in the morning. This information is not intended to replace advice given to  you by your health care provider. Make sure you discuss any questions you have with your health care provider. Document Revised: 05/21/2021 Document Reviewed: 05/21/2021 Elsevier Patient Education  [place].

## 2022-05-25 NOTE — Telephone Encounter (Signed)
Spoke with Thurmond Butts and cancelled the prescription for Belbuca 300 mg that could be filled 05-25-2022.  New script was sent to be filled on 06-24-2022.

## 2022-05-25 NOTE — Progress Notes (Signed)
BP 139/77   Pulse 83   Temp 97.9 F (36.6 C) (Oral)   Wt (!) 303 lb 12.8 oz (137.8 kg)   SpO2 93%   BMI 44.86 kg/m    Subjective:    Patient ID: Erin Contreras, female    DOB: 1960-10-12, 61 y.o.   MRN: [id number]  HPI: Erin Contreras is a 61 y.o. female  Chief Complaint  Patient presents with   Arthritis    Pt states she is needing to go to a new rheumatologist due to her previous one not accepting her insurance. States she has been taking the Rinvoq for 2 months and having headaches and feeling fatigued since starting the medicine    RHEUMATOID ARTHRITIS AND CHRONIC PAIN Last saw rheumatology in August 2023, [facility], but they do not take her new insurance (Medicaid).  Have a new referral in place to rheumatology in [place], placed yesterday and has been approved.  She is taking Rinvoq which was started in June 2023, she reports noticing occasional pain in joints still.  Has major fatigue which started with Rinvoq and occasional headaches. Has tried Methotrexate and shot in past, which worked better for her.  She is concerned that may have UTI and this may be why she is fatigued, no UTI symptoms -- has a light itch in vaginal area.  Followed by chronic pain management at [facility], last saw today -- they have ordered Belbuca for pain.   Pain control status: stable Duration: years Location: arms and elbows and wrists Quality: dull, aching, and throbbing Current Pain Level: 6/10 Previous Pain Level: 6/10 Breakthrough pain: no Benefit from narcotic medications: unknown What Activities task can be accomplished with current medication? Occasional ADLs Interested in weaning off narcotics:no   Stool softners/OTC fiber: no  Previous pain specialty evaluation: yes Non-narcotic analgesic meds: yes Narcotic contract: yes   Relevant past medical, surgical, family and social history reviewed and updated as indicated. Interim medical history since our last visit  reviewed. Allergies and medications reviewed and updated.  Review of Systems  Constitutional:  Positive for fatigue. Negative for activity change, appetite change, diaphoresis and unexpected weight change.  Respiratory: Negative.    Cardiovascular: Negative.   Gastrointestinal: Negative.   Genitourinary:  Positive for vaginal discharge. Negative for decreased urine volume, dysuria, frequency, hematuria, pelvic pain and urgency.  Musculoskeletal:  Positive for arthralgias.  Neurological: Negative.   Psychiatric/Behavioral: Negative.      Per HPI unless specifically indicated above     Objective:    BP 139/77   Pulse 83   Temp 97.9 F (36.6 C) (Oral)   Wt (!) 303 lb 12.8 oz (137.8 kg)   SpO2 93%   BMI 44.86 kg/m   Wt Readings from Last 3 Encounters:  05/25/22 (!) 303 lb 12.8 oz (137.8 kg)  05/25/22 (!) 303 lb (137.4 kg)  04/30/22 (!) 305 lb 9.6 oz (138.6 kg)    Physical Exam Vitals and nursing note reviewed.  Constitutional:      General: She is awake. She is not in acute distress.    Appearance: She is well-developed and well-groomed. She is obese. She is not ill-appearing.  HENT:     Head: Normocephalic.     Right Ear: Hearing normal.     Left Ear: Hearing normal.  Eyes:     General: Lids are normal.        Right eye: No discharge.        Left eye: No discharge.  Conjunctiva/sclera: Conjunctivae normal.     Pupils: Pupils are equal, round, and reactive to light.  Neck:     Vascular: No carotid bruit.  Cardiovascular:     Rate and Rhythm: Normal rate and regular rhythm.     Heart sounds: Normal heart sounds. No murmur heard.    No gallop.  Pulmonary:     Effort: Pulmonary effort is normal. No accessory muscle usage or respiratory distress.     Breath sounds: Normal breath sounds.  Abdominal:     General: Bowel sounds are normal. There is no distension.     Palpations: Abdomen is soft.     Tenderness: There is no abdominal tenderness.  Musculoskeletal:      Cervical back: Normal range of motion and neck supple.     Right lower leg: No edema.     Left lower leg: No edema.  Skin:    General: Skin is warm and dry.  Neurological:     Mental Status: She is alert and oriented to person, place, and time.  Psychiatric:        Attention and Perception: Attention normal.        Mood and Affect: Mood normal.        Speech: Speech normal.        Behavior: Behavior normal. Behavior is cooperative.        Thought Content: Thought content normal.     Results for orders placed or performed in visit on 04/09/22  WET PREP FOR [place], YEAST, CLUE   Specimen: Sterile Swab   Sterile Swab  Result Value Ref Range   Trichomonas Exam Negative Negative   Yeast Exam Negative Negative   Clue Cell Exam Negative Negative  CBC with Differential/Platelet  Result Value Ref Range   WBC 8.7 3.4 - 10.8 x10E3/uL   RBC 5.15 3.77 - 5.28 x10E6/uL   Hemoglobin 14.0 11.1 - 15.9 g/dL   Hematocrit 44.6 34.0 - 46.6 %   MCV 87 79 - 97 fL   MCH 27.2 26.6 - 33.0 pg   MCHC 31.4 (L) 31.5 - 35.7 g/dL   RDW 14.3 11.7 - 15.4 %   Platelets 286 150 - 450 x10E3/uL   Neutrophils 75 Not Estab. %   Lymphs 16 Not Estab. %   Monocytes 5 Not Estab. %   Eos 3 Not Estab. %   Basos 1 Not Estab. %   Neutrophils Absolute 6.5 1.4 - 7.0 x10E3/uL   Lymphocytes Absolute 1.4 0.7 - 3.1 x10E3/uL   Monocytes Absolute 0.5 0.1 - 0.9 x10E3/uL   EOS (ABSOLUTE) 0.2 0.0 - 0.4 x10E3/uL   Basophils Absolute 0.0 0.0 - 0.2 x10E3/uL   Immature Granulocytes 0 Not Estab. %   Immature Grans (Abs) 0.0 0.0 - 0.1 x10E3/uL  Comprehensive metabolic panel  Result Value Ref Range   Glucose 93 70 - 99 mg/dL   BUN 20 8 - 27 mg/dL   Creatinine, Ser 1.03 (H) 0.57 - 1.00 mg/dL   eGFR 61 >59 mL/min/1.73   BUN/Creatinine Ratio 19 12 - 28   Sodium 138 134 - 144 mmol/L   Potassium 4.7 3.5 - 5.2 mmol/L   Chloride 102 96 - 106 mmol/L   CO2 22 20 - 29 mmol/L   Calcium 9.6 8.7 - 10.3 mg/dL   Total Protein 7.2 6.0 -  8.5 g/dL   Albumin 4.1 3.9 - 4.9 g/dL   Globulin, Total 3.1 1.5 - 4.5 g/dL   Albumin/Globulin Ratio 1.3 1.2 - 2.2  Bilirubin Total 0.2 0.0 - 1.2 mg/dL   Alkaline Phosphatase 117 44 - 121 IU/L   AST 22 0 - 40 IU/L   ALT 26 0 - 32 IU/L  Lipid Panel w/o Chol/HDL Ratio  Result Value Ref Range   Cholesterol, Total 242 (H) 100 - 199 mg/dL   Triglycerides 140 0 - 149 mg/dL   HDL 78 >39 mg/dL   VLDL Cholesterol Cal 24 5 - 40 mg/dL   LDL Chol Calc (NIH) 140 (H) 0 - 99 mg/dL  TSH  Result Value Ref Range   TSH 0.585 0.450 - 4.500 uIU/mL  Vitamin B12  Result Value Ref Range   Vitamin B-12 643 232 - 1,245 pg/mL  VITAMIN D 25 Hydroxy (Vit-D Deficiency, Fractures)  Result Value Ref Range   Vit D, 25-Hydroxy 21.7 (L) 30.0 - 100.0 ng/mL  T4, free  Result Value Ref Range   Free T4 1.04 0.82 - 1.77 ng/dL  HgB A1c  Result Value Ref Range   Hgb A1c MFr Bld 6.2 (H) 4.8 - 5.6 %   Est. average glucose Bld gHb Est-mCnc 131 mg/dL  Cytology - PAP  Result Value Ref Range   High risk HPV Negative    Adequacy      Satisfactory for evaluation. The presence or absence of an   Adequacy      endocervical/transformation zone component cannot be determined because   Adequacy of atrophy.    Diagnosis      - Negative for intraepithelial lesion or malignancy (NILM)   Comment Inflammation and atrophic changes are present.    Comment Normal Reference Range HPV - Negative       Assessment & Plan:   Problem List Items Addressed This Visit       Musculoskeletal and Integument   Rheumatoid arthritis involving multiple sites with positive rheumatoid factor ([place]) - Primary (Chronic)    Chronic, ongoing, followed by rheumatology and pain clinic.  Currently is in between rheumatology clinics due to insurance, have provided her phone number for [facility] clinic to call and get scheduled.  At this time recommend she continue Rinvoq until she sees them -- may need to return to Methotrexate and infusions which  offered more benefit in past. Labs today: CBC, CMP, TSH.      Relevant Medications   Upadacitinib ER (RINVOQ) 15 MG TB24   Other Relevant Orders   CBC with Differential/Platelet   Comprehensive metabolic panel   TSH   Other Visit Diagnoses     Vagina itching       Check wet prep and UA today -- will treat as needed.   Relevant Orders   Urinalysis, Routine w reflex microscopic   WET PREP FOR TRICH, YEAST, CLUE   Need for influenza vaccination       Flu vaccine today   Relevant Orders   Flu Vaccine QUAD 23moIM (Fluarix, Fluzone & Alfiuria Quad PF) (Completed)        Follow up plan: Return for as scheduled in March 2023.

## 2022-05-25 NOTE — Progress Notes (Signed)
PROVIDER NOTE: Information contained herein reflects review and annotations entered in association with encounter. Interpretation of such information and data should be left to medically-trained personnel. Information provided to patient can be located elsewhere in the medical record under "Patient Instructions". Document created using STT-dictation technology, any transcriptional errors that may result from process are unintentional.    Patient: Erin Contreras  Service Category: E/M  Provider: Gillis Santa, MD  DOB: 21-Jun-1961  DOS: 05/25/2022  Specialty: Interventional Pain Management  MRN: [id number]  Setting: Ambulatory outpatient  PCP: Venita Lick, NP  Type: Established Patient    Referring Provider: Venita Lick, NP  Location: Office  Delivery: Face-to-face     HPI  [name], a 61 y.o. year old female, is here today because of her Spasm of right piriformis muscle [M62.838]. Erin Contreras primary complain today is Back Pain  Last encounter: My last encounter with her was on 03/02/22  Pertinent problems: Erin Contreras has OA (osteoarthritis) of knee; Obesity; Chronic radicular lumbar pain; Lumbar spondylosis; Lumbar degenerative disc disease; Sacroiliac joint pain; SI joint arthritis; and Chronic pain syndrome on their pertinent problem list. Pain Assessment: Severity of Chronic pain is reported as a 8 /10. Location: Back Lower/down side of right leg to ankle. Onset: More than a month ago. Quality: Aching. Timing: Constant. Modifying factor(s): meds  "ease pain, calms pain down". Vitals:  height is $RemoveB'5\' 9"'UcENSmwq$  (1.753 m) and weight is 303 lb (137.4 kg) (abnormal). Her temporal temperature is 97.4 F (36.3 C) (abnormal). Her blood pressure is 128/75 and her pulse is 83. Her respiration is 17 and oxygen saturation is 93%.   Reason for encounter: medication management.    Erin Contreras presents today with right piriformis pain as well as right calf pain.  She states that she experiences Spasms  at night.  She is describing stiffness in her right leg and pain in her calf with plantar and dorsiflexion.  I do not appreciate any swelling.  Low concern for DVT.  I encouraged her to try magnesium nightly at 500 mg - 1000 mg to see if that helps out with her spasms.  We could also consider a right piriformis trigger point injection.  She continues Lyrica 100 mg daily with limited response.  We discussed buprenorphine for chronic pain management.  I reviewed her options which include transdermal Butrans versus buccal belbuca.  Patient would like to trial belbuca.  Start at 150 mcg every 12 hours and increase to 300 mcg twice daily.   ROS  Constitutional: Denies any fever or chills Gastrointestinal: No reported hemesis, hematochezia, vomiting, or acute GI distress Musculoskeletal:  Right greater than left low back, right buttock, right hip, pain Neurological: No reported episodes of acute onset apraxia, aphasia, dysarthria, agnosia, amnesia, paralysis, loss of coordination, or loss of consciousness  Medication Review  Buprenorphine HCl, Cholecalciferol, DULoxetine, acyclovir, cyanocobalamin, pantoprazole, and pregabalin  History Review  Allergy: Erin Contreras has No Known Allergies. Drug: Erin Contreras  reports no history of drug use. Alcohol:  reports no history of alcohol use. Tobacco:  reports that she has never smoked. She has never used smokeless tobacco. Social: Erin Contreras  reports that she has never smoked. She has never used smokeless tobacco. She reports that she does not drink alcohol and does not use drugs. Medical:  has a past medical history of Boils, Ear mass, GERD (gastroesophageal reflux disease), Hemorrhoids, Herpes simplex virus infection, HSV infection, Lumbago with sciatica, OA (osteoarthritis) of knee, Obesity, Overactive  bladder, PONV (postoperative nausea and vomiting), Pre-diabetes, RA (rheumatoid arthritis) ([place]), Sleep apnea, and Urge and stress incontinence. Surgical: [name]  has a past surgical history that includes Cholecystectomy (2011); Tumor removal (Right, 2010); Sleeve Gastroplasty; Fracture surgery (Right); Knee Arthroplasty (Right, 07/11/2017); Knee Arthroplasty (Left, 01/28/2021); Joint replacement (Left, 01/28/2021); and Colonoscopy (N/A, 03/19/2021). Family: family history includes Cancer in her mother; Cirrhosis in her father; Diabetes in her mother, paternal grandfather, and paternal grandmother; Heart disease in her paternal grandfather and paternal grandmother; Hyperlipidemia in her mother; Hypertension in her daughter and mother; Stroke in her maternal grandfather and maternal grandmother.  Laboratory Chemistry Profile   Renal Lab Results  Component Value Date   BUN 20 04/09/2022   CREATININE 1.03 (H) 04/09/2022   BCR 19 04/09/2022   GFRAA 102 06/12/2020   GFRNONAA >60 01/05/2022     Hepatic Lab Results  Component Value Date   AST 22 04/09/2022   ALT 26 04/09/2022   ALBUMIN 4.1 04/09/2022   ALKPHOS 117 04/09/2022   LIPASE 106 05/10/2014     Electrolytes Lab Results  Component Value Date   NA 138 04/09/2022   K 4.7 04/09/2022   CL 102 04/09/2022   CALCIUM 9.6 04/09/2022   MG 2.0 09/04/2014   PHOS 3.7 09/04/2014     Bone Lab Results  Component Value Date   VD25OH 21.7 (L) 04/09/2022     Inflammation (CRP: Acute Phase) (ESR: Chronic Phase) Lab Results  Component Value Date   CRP 0.6 01/21/2021   ESRSEDRATE 43 (H) 01/21/2021       Note: Above Lab results reviewed.   Physical Exam  General appearance: Well nourished, well developed, and well hydrated. In no apparent acute distress Mental status: Alert, oriented x 3 (person, place, & time)       Respiratory: No evidence of acute respiratory distress Eyes: PERLA Vitals: BP 128/75   Pulse 83   Temp (!) 97.4 F (36.3 C) (Temporal)   Resp 17   Ht $R'5\' 9"'OT$  (1.753 m)   Wt (!) 303 lb (137.4 kg)   SpO2 93%   BMI 44.75 kg/m  BMI: Estimated body mass index is 44.75  kg/m as calculated from the following:   Height as of this encounter: $RemoveBeforeD'5\' 9"'TytAwdozSKrIls$  (1.753 m).   Weight as of this encounter: 303 lb (137.4 kg). Ideal: Ideal body weight: 66.2 kg (145 lb 15.1 oz) Adjusted ideal body weight: 94.7 kg (208 lb 12.3 oz)  Lumbar Spine Area Exam  Skin & Axial Inspection: No masses, redness, or swelling Alignment: Symmetrical Functional ROM: Pain restricted ROM affecting primarily the right >left Stability: No instability detected Muscle Tone/Strength: Functionally intact. No obvious neuro-muscular anomalies detected. Sensory (Neurological): Musculoskeletal pain pattern right low back, hip, SI joint  Gait & Posture Assessment  Ambulation: Limited Gait: Antalgic Posture: Difficulty standing up straight, due to pain  Lower Extremity Exam    Side: Right lower extremity  Side: Left lower extremity  Stability: No instability observed          Stability: No instability observed          Skin & Extremity Inspection: Skin color, temperature, and hair growth are WNL. No peripheral edema or cyanosis. No masses, redness, swelling, asymmetry, or associated skin lesions. No contractures.  Skin & Extremity Inspection: Skin color, temperature, and hair growth are WNL. No peripheral edema or cyanosis. No masses, redness, swelling, asymmetry, or associated skin lesions. No contractures.  Functional ROM: Pain restricted ROM for hip and  knee joints          Functional ROM: Unrestricted ROM                  Muscle Tone/Strength: Functionally intact. No obvious neuro-muscular anomalies detected.  Muscle Tone/Strength: Functionally intact. No obvious neuro-muscular anomalies detected.  Sensory (Neurological): Arthropathic arthralgia        Sensory (Neurological): Unimpaired        DTR: Patellar: deferred today Achilles: deferred today Plantar: deferred today  DTR: Patellar: deferred today Achilles: deferred today Plantar: deferred today  Palpation: No palpable anomalies  Palpation: No  palpable anomalies    Assessment   Status Diagnosis  Persistent Persistent Persistent 1. Spasm of right piriformis muscle   2. Piriformis syndrome of right side   3. Neurogenic pain   4. Chronic pain syndrome   5. SI joint arthritis   6. Piriformis syndrome of left side   7. Sacroiliac joint pain         Plan of Care  [name] has a current medication list which includes the following long-term medication(s): pantoprazole, duloxetine, and pregabalin.  Requested Prescriptions   Signed Prescriptions Disp Refills   Buprenorphine HCl (BELBUCA) 150 MCG FILM 60 Film 0    Sig: Place 1 Film inside cheek every 12 (twelve) hours.   pregabalin (LYRICA) 100 MG capsule 30 capsule 2    Sig: Take 1 capsule (100 mg total) by mouth at bedtime.   DULoxetine (CYMBALTA) 20 MG capsule 60 capsule 2    Sig: Take 2 capsules (40 mg total) by mouth daily after breakfast.   Buprenorphine HCl (BELBUCA) 300 MCG FILM 60 Film 0    Sig: Place 1 Film inside cheek every 12 (twelve) hours.  Consider magnesium supplementation nightly, 500 mg -1000 mg  No orders of the defined types were placed in this encounter.    Follow-up plan:   Return in about 8 weeks (around 07/22/2022) for Medication Management, in person.    Recent Visits Date Type Provider Dept  03/02/22 Office Visit Gillis Santa, MD [facility]  Showing recent visits within past 90 days and meeting all other requirements Today's Visits Date Type Provider Dept  05/25/22 Office Visit Gillis Santa, MD [facility]  Showing today's visits and meeting all other requirements Future Appointments No visits were found meeting these conditions. Showing future appointments within next 90 days and meeting all other requirements  I discussed the assessment and treatment plan with the patient. The patient was provided an opportunity to ask questions and all were answered. The patient agreed with the plan and  demonstrated an understanding of the instructions.  Patient advised to call back or seek an in-person evaluation if the symptoms or condition worsens.  Duration of encounter: 30 minutes.  Note by: Gillis Santa, MD Date: 05/25/2022; Time: 11:08 AM

## 2022-05-25 NOTE — Telephone Encounter (Signed)
Patient notified

## 2022-05-25 NOTE — Progress Notes (Signed)
Safety precautions to be maintained throughout the outpatient stay will include: orient to surroundings, keep bed in low position, maintain call bell within reach at all times, provide assistance with transfer out of bed and ambulation.  

## 2022-05-25 NOTE — Progress Notes (Signed)
Please let Erin Contreras know that wet prep did return showing clue cells, meaning bacterial vaginosis, which she has had in past.  Am sending in Flagyl for treatment and recommend she complete full course.:)

## 2022-05-26 ENCOUNTER — Other Ambulatory Visit: Payer: Self-pay | Admitting: Nurse Practitioner

## 2022-05-26 DIAGNOSIS — R7989 Other specified abnormal findings of blood chemistry: Secondary | ICD-10-CM

## 2022-05-26 LAB — CBC WITH DIFFERENTIAL/PLATELET
Basophils Absolute: 0.1 10*3/uL (ref 0.0–0.2)
Basos: 1 %
EOS (ABSOLUTE): 0.2 10*3/uL (ref 0.0–0.4)
Eos: 3 %
Hematocrit: 38.1 % (ref 34.0–46.6)
Hemoglobin: 12.3 g/dL (ref 11.1–15.9)
Immature Grans (Abs): 0 10*3/uL (ref 0.0–0.1)
Immature Granulocytes: 0 %
Lymphocytes Absolute: 1.3 10*3/uL (ref 0.7–3.1)
Lymphs: 19 %
MCH: 27.9 pg (ref 26.6–33.0)
MCHC: 32.3 g/dL (ref 31.5–35.7)
MCV: 86 fL (ref 79–97)
Monocytes Absolute: 0.5 10*3/uL (ref 0.1–0.9)
Monocytes: 8 %
Neutrophils Absolute: 4.7 10*3/uL (ref 1.4–7.0)
Neutrophils: 69 %
Platelets: 279 10*3/uL (ref 150–450)
RBC: 4.41 x10E6/uL (ref 3.77–5.28)
RDW: 14.3 % (ref 11.7–15.4)
WBC: 6.9 10*3/uL (ref 3.4–10.8)

## 2022-05-26 LAB — COMPREHENSIVE METABOLIC PANEL
ALT: 19 IU/L (ref 0–32)
AST: 18 IU/L (ref 0–40)
Albumin/Globulin Ratio: 1.3 (ref 1.2–2.2)
Albumin: 3.8 g/dL — ABNORMAL LOW (ref 3.9–4.9)
Alkaline Phosphatase: 115 IU/L (ref 44–121)
BUN/Creatinine Ratio: 15 (ref 12–28)
BUN: 13 mg/dL (ref 8–27)
Bilirubin Total: 0.3 mg/dL (ref 0.0–1.2)
CO2: 24 mmol/L (ref 20–29)
Calcium: 9.3 mg/dL (ref 8.7–10.3)
Chloride: 104 mmol/L (ref 96–106)
Creatinine, Ser: 0.88 mg/dL (ref 0.57–1.00)
Globulin, Total: 2.9 g/dL (ref 1.5–4.5)
Glucose: 84 mg/dL (ref 70–99)
Potassium: 4.1 mmol/L (ref 3.5–5.2)
Sodium: 142 mmol/L (ref 134–144)
Total Protein: 6.7 g/dL (ref 6.0–8.5)
eGFR: 74 mL/min/{1.73_m2} (ref >59)

## 2022-05-26 LAB — TSH: TSH: 0.383 u[IU]/mL — ABNORMAL LOW (ref 0.450–4.500)

## 2022-05-26 NOTE — Progress Notes (Signed)
Good afternoon, please let Madalene know labs have returned: - Kidney and liver function are normal. - Thyroid level is on low side again, meaning overactive.  I would like you to return in 4 weeks to recheck this on outpatient labs only. If it continues to run lower we may get imaging and send to endocrinology. - CBC shows normal counts, no infection or anemia.  Any questions? Keep being amazing!!  Thank you for allowing me to participate in your care.  I appreciate you. Kindest regards, Adalin Vanderploeg

## 2022-05-27 ENCOUNTER — Telehealth: Payer: Self-pay | Admitting: Nurse Practitioner

## 2022-05-27 NOTE — Telephone Encounter (Signed)
Copied from [place] [id number]. Topic: Referral - Status >> May 27, 2022 10:42 AM Sabas Sous wrote: Reason for CRM: Seeking new rheumatologist referral since the current location cannot see her until may 2024

## 2022-06-09 ENCOUNTER — Ambulatory Visit: Payer: Medicaid Other | Attending: Cardiology

## 2022-06-09 DIAGNOSIS — R079 Chest pain, unspecified: Secondary | ICD-10-CM

## 2022-06-09 LAB — ECHOCARDIOGRAM COMPLETE
AR max vel: 2.79 cm2
AV Area VTI: 3.04 cm2
AV Area mean vel: 2.91 cm2
AV Mean grad: 3 mmHg
AV Peak grad: 6.1 mmHg
Ao pk vel: 1.23 m/s
Area-P 1/2: 3.28 cm2
Calc EF: 56.6 %
S' Lateral: 3 cm
Single Plane A2C EF: 56.7 %
Single Plane A4C EF: 54.9 %

## 2022-06-11 IMAGING — DX DG KNEE 1-2V PORT*L*
2 series · 2 of 2 positions shown · non-contrast
Comparison: September 30, 2019

CLINICAL DATA: Status post total knee replacement

EXAM:
PORTABLE LEFT KNEE - 1-2 VIEW

[knee ap]
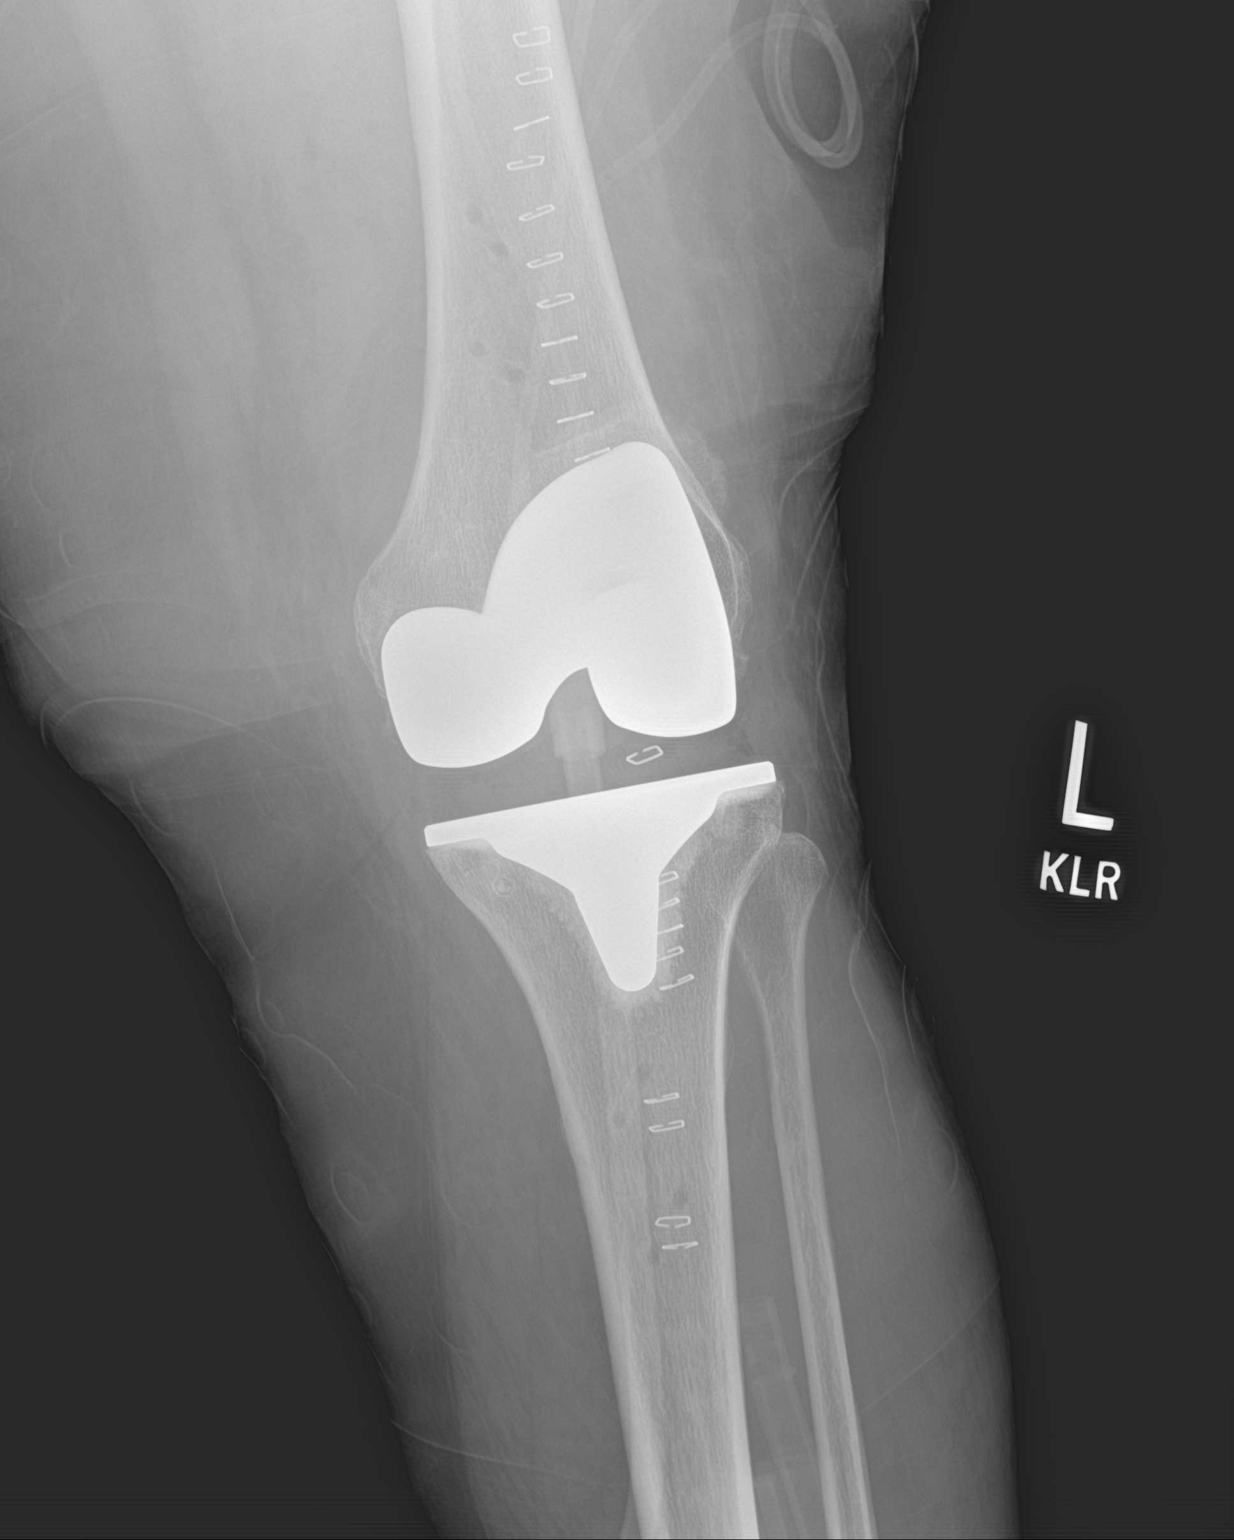

[knee lat]
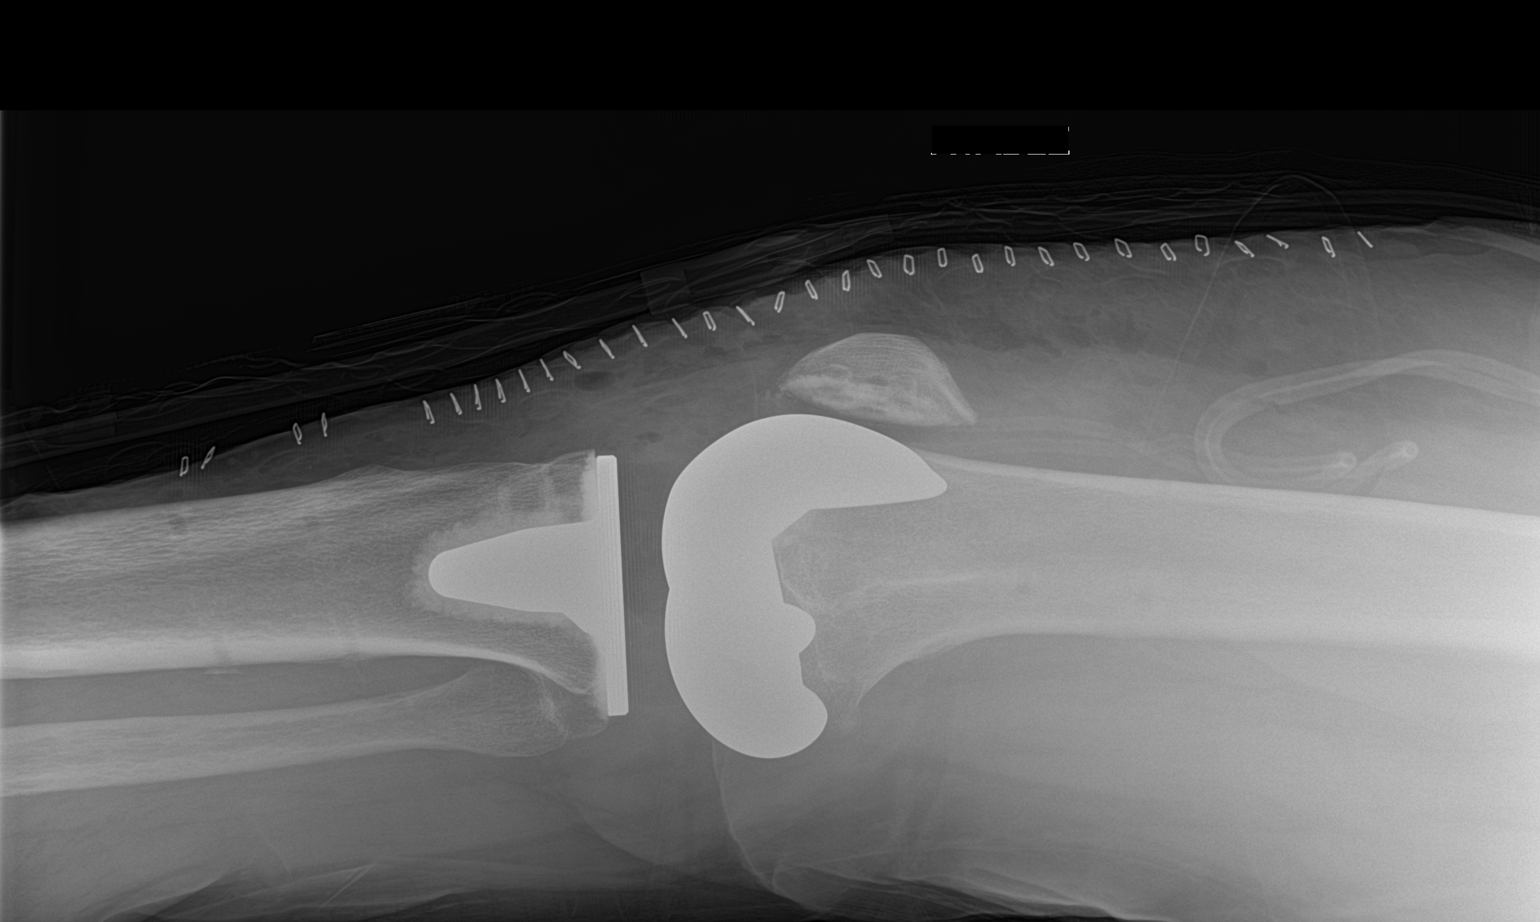

[2 of 2 positions shown; findings below may reference images not displayed]

FINDINGS: Frontal and lateral views obtained. Patient is status post total
knee replacement with prosthetic components well-seated. No fracture
or dislocation. Erosive change. Drain in suprapatellar bursa region.
Skin staples noted anteriorly.
IMPRESSION: Status post total knee replacement with prosthetic components
well-seated. No fracture or dislocation. Acute postoperative changes
noted.

## 2022-06-28 ENCOUNTER — Other Ambulatory Visit: Payer: Medicaid Other

## 2022-06-28 DIAGNOSIS — R7989 Other specified abnormal findings of blood chemistry: Secondary | ICD-10-CM | POA: Diagnosis not present

## 2022-06-29 LAB — TSH: TSH: 0.545 u[IU]/mL (ref 0.450–4.500)

## 2022-06-29 LAB — THYROID PEROXIDASE ANTIBODY: Thyroperoxidase Ab SerPl-aCnc: 12 IU/mL (ref 0–34)

## 2022-06-29 LAB — T4, FREE: Free T4: 1.01 ng/dL (ref 0.82–1.77)

## 2022-06-29 NOTE — Progress Notes (Signed)
Good morning, please let Erin Contreras know her labs have returned normal on this check of thyroid.  No changes needed.   Keep being amazing!!  Thank you for allowing me to participate in your care.  I appreciate you. Kindest regards, Kerrilyn Azbill

## 2022-07-22 ENCOUNTER — Ambulatory Visit
Payer: Medicaid Other | Attending: Student in an Organized Health Care Education/Training Program | Admitting: Student in an Organized Health Care Education/Training Program

## 2022-07-22 ENCOUNTER — Encounter: Payer: Self-pay | Admitting: Student in an Organized Health Care Education/Training Program

## 2022-07-22 VITALS — BP 139/86 | HR 76 | Temp 97.4°F | Resp 18 | Ht 69.0 in | Wt 300.0 lb

## 2022-07-22 DIAGNOSIS — G894 Chronic pain syndrome: Secondary | ICD-10-CM | POA: Diagnosis not present

## 2022-07-22 DIAGNOSIS — M62838 Other muscle spasm: Secondary | ICD-10-CM | POA: Diagnosis not present

## 2022-07-22 DIAGNOSIS — M533 Sacrococcygeal disorders, not elsewhere classified: Secondary | ICD-10-CM | POA: Diagnosis not present

## 2022-07-22 DIAGNOSIS — M47818 Spondylosis without myelopathy or radiculopathy, sacral and sacrococcygeal region: Secondary | ICD-10-CM | POA: Diagnosis not present

## 2022-07-22 DIAGNOSIS — G5701 Lesion of sciatic nerve, right lower limb: Secondary | ICD-10-CM | POA: Diagnosis not present

## 2022-07-22 DIAGNOSIS — M792 Neuralgia and neuritis, unspecified: Secondary | ICD-10-CM

## 2022-07-22 DIAGNOSIS — G5702 Lesion of sciatic nerve, left lower limb: Secondary | ICD-10-CM

## 2022-07-22 MED ORDER — MAGNESIUM 500 MG PO CAPS: 500.0000 mg | ORAL_CAPSULE | Freq: Every day | ORAL | 0 refills | Status: AC

## 2022-07-22 MED ORDER — PREGABALIN 100 MG PO CAPS: 100.0000 mg | ORAL_CAPSULE | Freq: Two times a day (BID) | ORAL | 2 refills | Status: DC

## 2022-07-22 MED ORDER — DULOXETINE HCL 20 MG PO CPEP: 40.0000 mg | ORAL_CAPSULE | Freq: Every day | ORAL | 2 refills | Status: DC

## 2022-07-22 NOTE — Progress Notes (Signed)
PROVIDER NOTE: Information contained herein reflects review and annotations entered in association with encounter. Interpretation of such information and data should be left to medically-trained personnel. Information provided to patient can be located elsewhere in the medical record under "Patient Instructions". Document created using STT-dictation technology, any transcriptional errors that may result from process are unintentional.    Patient: Erin Contreras  Service Category: E/M  Provider: Gillis Santa, MD  DOB: [birth year]  DOS: 07/22/2022  Specialty: Interventional Pain Management  MRN: [id number]  Setting: Ambulatory outpatient  PCP: Venita Lick, NP  Type: Established Patient    Referring Provider: Venita Lick, NP  Location: Office  Delivery: Face-to-face     HPI  [name], a 61 y.o. year old female, is here today because of her Spasm of right piriformis muscle [M62.838]. Ms. [name] primary complain today is Back Pain (lower)  Last encounter: My last encounter with her was on 05/25/2022  Pertinent problems: Ms. [name] has OA (osteoarthritis) of knee; Obesity; Chronic radicular lumbar pain; Lumbar spondylosis; Lumbar degenerative disc disease; Sacroiliac joint pain; SI joint arthritis; and Chronic pain syndrome on their pertinent problem list. Pain Assessment: Severity of Chronic pain is reported as a 7 /10. Location: Back Lower/right leg to the ankle. Onset: More than a month ago. Quality: Tightness, Dull, Sharp. Timing: Constant. Modifying factor(s): nothing. Vitals:  height is _0  (1.753 m) and weight is 300 lb (136.1 kg). Her temporal temperature is 97.4 F (36.3 C) (abnormal). Her blood pressure is 139/86 and her pulse is 76. Her respiration is 18 and oxygen saturation is 96%.   Reason for encounter: medication management.    Patient's buprenorphine was denied by Medicaid.  She continues to endorse right calf tightness as well as cramps.  I encouraged her to  increase her water intake.  There is no significant swelling in that area, concern for DVT is low.  She has not started magnesium that I recommended at her last visit.  I encouraged her to also increase her Lyrica to 100 mg twice a day to see if that helps.   ROS  Constitutional: Denies any fever or chills Gastrointestinal: No reported hemesis, hematochezia, vomiting, or acute GI distress Musculoskeletal:  Right calf spasms and pain Neurological: No reported episodes of acute onset apraxia, aphasia, dysarthria, agnosia, amnesia, paralysis, loss of coordination, or loss of consciousness  Medication Review  Cholecalciferol, DULoxetine, Magnesium, Upadacitinib ER, acyclovir, cyanocobalamin, pantoprazole, and pregabalin  History Review  Allergy: Ms. [name] has No Known Allergies. Drug: Ms. [name]  reports no history of drug use. Alcohol:  reports no history of alcohol use. Tobacco:  reports that she has never smoked. She has never used smokeless tobacco. Social: Ms. [name]  reports that she has never smoked. She has never used smokeless tobacco. She reports that she does not drink alcohol and does not use drugs. Medical:  has a past medical history of Boils, Ear mass, GERD (gastroesophageal reflux disease), Hemorrhoids, Herpes simplex virus infection, HSV infection, Lumbago with sciatica, OA (osteoarthritis) of knee, Obesity, Overactive bladder, PONV (postoperative nausea and vomiting), Pre-diabetes, RA (rheumatoid arthritis) ([place]), Sleep apnea, and Urge and stress incontinence. Surgical: Ms. [name]  has a past surgical history that includes Cholecystectomy (2011); Tumor removal (Right, 2010); Sleeve Gastroplasty; Fracture surgery (Right); Knee Arthroplasty (Right, 07/11/2017); Knee Arthroplasty (Left, 01/28/2021); Joint replacement (Left, 01/28/2021); and Colonoscopy (N/A, 03/19/2021). Family: family history includes Cancer in her mother; Cirrhosis in her father; Diabetes in her mother, paternal  grandfather,  and paternal grandmother; Heart disease in her paternal grandfather and paternal grandmother; Hyperlipidemia in her mother; Hypertension in her daughter and mother; Stroke in her maternal grandfather and maternal grandmother.  Laboratory Chemistry Profile   Renal Lab Results  Component Value Date   BUN 13 05/25/2022   CREATININE 0.88 05/25/2022   BCR 15 05/25/2022   GFRAA 102 06/12/2020   GFRNONAA >60 01/05/2022     Hepatic Lab Results  Component Value Date   AST 18 05/25/2022   ALT 19 05/25/2022   ALBUMIN 3.8 (L) 05/25/2022   ALKPHOS 115 05/25/2022   LIPASE 106 05/10/2014     Electrolytes Lab Results  Component Value Date   NA 142 05/25/2022   K 4.1 05/25/2022   CL 104 05/25/2022   CALCIUM 9.3 05/25/2022   MG 2.0 09/04/2014   PHOS 3.7 09/04/2014     Bone Lab Results  Component Value Date   VD25OH 21.7 (L) 04/09/2022     Inflammation (CRP: Acute Phase) (ESR: Chronic Phase) Lab Results  Component Value Date   CRP 0.6 01/21/2021   ESRSEDRATE 43 (H) 01/21/2021       Note: Above Lab results reviewed.   Physical Exam  General appearance: Well nourished, well developed, and well hydrated. In no apparent acute distress Mental status: Alert, oriented x 3 (person, place, & time)       Respiratory: No evidence of acute respiratory distress Eyes: PERLA Vitals: BP 139/86   Pulse 76   Temp (!) 97.4 F (36.3 C) (Temporal)   Resp 18   Ht _0  (1.753 m)   Wt 300 lb (136.1 kg)   SpO2 96%   BMI 44.30 kg/m  BMI: Estimated body mass index is 44.3 kg/m as calculated from the following:   Height as of this encounter: _1  (1.753 m).   Weight as of this encounter: 300 lb (136.1 kg). Ideal: Ideal body weight: 66.2 kg (145 lb 15.1 oz) Adjusted ideal body weight: 94.2 kg (207 lb 9.1 oz)   Gait & Posture Assessment  Ambulation: Limited Gait: Antalgic Posture: Difficulty standing up straight, due to pain   Lower Extremity Exam      Side: Right lower  extremity   Side: Left lower extremity  Stability: No instability observed           Stability: No instability observed          Skin & Extremity Inspection: Skin color, temperature, and hair growth are WNL. No peripheral edema or cyanosis. No masses, redness, swelling, asymmetry, or associated skin lesions. No contractures.   Skin & Extremity Inspection: Skin color, temperature, and hair growth are WNL. No peripheral edema or cyanosis. No masses, redness, swelling, asymmetry, or associated skin lesions. No contractures.  Functional ROM: Pain restricted ROM for hip and knee joints           Functional ROM: Unrestricted ROM                  Muscle Tone/Strength: Functionally intact. No obvious neuro-muscular anomalies detected.   Muscle Tone/Strength: Functionally intact. No obvious neuro-muscular anomalies detected.  Sensory (Neurological): Arthropathic arthralgia         Sensory (Neurological): Unimpaired        DTR: Patellar: deferred today Achilles: deferred today Plantar: deferred today   DTR: Patellar: deferred today Achilles: deferred today Plantar: deferred today  Palpation: No palpable anomalies   Palpation: No palpable anomalies     Assessment   Status Diagnosis  Persistent  Persistent Persistent 1. Spasm of right piriformis muscle   2. Piriformis syndrome of right side   3. Neurogenic pain   4. SI joint arthritis   5. Piriformis syndrome of left side   6. Chronic pain syndrome   7. Sacroiliac joint pain         Plan of Care  Erin Contreras has a current medication list which includes the following long-term medication(s): pantoprazole, duloxetine, and pregabalin.  Requested Prescriptions   Signed Prescriptions Disp Refills   pregabalin (LYRICA) 100 MG capsule 60 capsule 2    Sig: Take 1 capsule (100 mg total) by mouth 2 (two) times daily.   Magnesium 500 MG CAPS 180 capsule 0    Sig: Take 1 capsule (500 mg total) by mouth at bedtime.   DULoxetine (CYMBALTA)  20 MG capsule 60 capsule 2    Sig: Take 2 capsules (40 mg total) by mouth daily after breakfast.    No orders of the defined types were placed in this encounter.    Follow-up plan:   Return in about 3 months (around 10/21/2022) for Medication Management, in person.    Recent Visits Date Type Provider Dept  05/25/22 Office Visit Gillis Santa, MD [facility]  Showing recent visits within past 90 days and meeting all other requirements Today's Visits Date Type Provider Dept  07/22/22 Office Visit Gillis Santa, MD [facility]  Showing today's visits and meeting all other requirements Future Appointments Date Type Provider Dept  10/14/22 Appointment Gillis Santa, MD [facility]  Showing future appointments within next 90 days and meeting all other requirements  I discussed the assessment and treatment plan with the patient. The patient was provided an opportunity to ask questions and all were answered. The patient agreed with the plan and demonstrated an understanding of the instructions.  Patient advised to call back or seek an in-person evaluation if the symptoms or condition worsens.  Duration of encounter: 30 minutes.  Note by: Gillis Santa, MD Date: 07/22/2022; Time: 9:26 AM

## 2022-08-05 ENCOUNTER — Ambulatory Visit
Admission: RE | Admit: 2022-08-05 | Discharge: 2022-08-05 | Disposition: A | Discharge status: Home / Self Care | Payer: Medicaid Other | Source: Ambulatory Visit | Attending: Family Medicine | Admitting: Family Medicine

## 2022-08-05 ENCOUNTER — Ambulatory Visit (INDEPENDENT_AMBULATORY_CARE_PROVIDER_SITE_OTHER): Payer: Medicaid Other | Admitting: Family Medicine

## 2022-08-05 ENCOUNTER — Encounter: Payer: Self-pay | Admitting: Family Medicine

## 2022-08-05 ENCOUNTER — Ambulatory Visit
Admission: RE | Admit: 2022-08-05 | Discharge: 2022-08-05 | Disposition: A | Discharge status: Home / Self Care | Payer: Medicaid Other | Source: Home / Self Care | Attending: Family Medicine | Admitting: Family Medicine

## 2022-08-05 VITALS — BP 123/84 | HR 82 | Temp 98.1°F | Wt 310.1 lb

## 2022-08-05 DIAGNOSIS — M79671 Pain in right foot: Secondary | ICD-10-CM | POA: Insufficient documentation

## 2022-08-05 MED ORDER — PREDNISONE 10 MG PO TABS: ORAL_TABLET | ORAL | 0 refills | Status: DC

## 2022-08-05 NOTE — Progress Notes (Signed)
BP 123/84   Pulse 82   Temp 98.1 F (36.7 C) (Oral)   Wt (!) 310 lb 1.6 oz (140.7 kg)   SpO2 96%   BMI 45.79 kg/m    Subjective:    Patient ID: Erin Contreras, female    DOB: [birth year], 62 y.o.   MRN: [id number]  HPI: Erin Contreras is a 62 y.o. female  Chief Complaint  Patient presents with   Foot Pain    Patient says she started having the pain about 2 days ago. Patient says it feels like the bone underneath her big toe.    FOOT PAIN Duration: 2 days Involved foot: right Mechanism of injury: unknown Location: under big toe Onset: sudden  Severity: moderate  Quality:  sharp Frequency: intermittent Radiation: no Aggravating factors: weight bearing and walking  Alleviating factors: nothing  Status: stable Treatments attempted: rest  Relief with NSAIDs?:  No NSAIDs Taken Weakness with weight bearing or walking: no Morning stiffness: no Swelling: yes Redness: no Bruising: no Paresthesias / decreased sensation: no  Fevers:no  Relevant past medical, surgical, family and social history reviewed and updated as indicated. Interim medical history since our last visit reviewed. Allergies and medications reviewed and updated.  Review of Systems  Per HPI unless specifically indicated above     Objective:    BP 123/84   Pulse 82   Temp 98.1 F (36.7 C) (Oral)   Wt (!) 310 lb 1.6 oz (140.7 kg)   SpO2 96%   BMI 45.79 kg/m   Wt Readings from Last 3 Encounters:  08/05/22 (!) 310 lb 1.6 oz (140.7 kg)  07/22/22 300 lb (136.1 kg)  05/25/22 (!) 303 lb 12.8 oz (137.8 kg)    Physical Exam Vitals and nursing note reviewed.  Constitutional:      General: She is not in acute distress.    Appearance: Normal appearance. She is well-developed.  HENT:     Head: Normocephalic and atraumatic.     Right Ear: Hearing and external ear normal.     Left Ear: Hearing and external ear normal.     Nose: Nose normal.     Mouth/Throat:     Mouth: Mucous membranes are moist.      Pharynx: Oropharynx is clear.  Eyes:     General: Lids are normal. No scleral icterus.       Right eye: No discharge.        Left eye: No discharge.     Conjunctiva/sclera: Conjunctivae normal.  Pulmonary:     Effort: Pulmonary effort is normal. No respiratory distress.  Musculoskeletal:        General: Tenderness (between joint of 1st and 2nd MCP, not redness, heat or swelling) present. Normal range of motion.  Skin:    Coloration: Skin is not jaundiced or pale.     Findings: No bruising, erythema, lesion or rash.  Neurological:     Mental Status: She is alert. Mental status is at baseline. She is disoriented.  Psychiatric:        Mood and Affect: Mood normal.        Speech: Speech normal.        Behavior: Behavior normal.        Thought Content: Thought content normal.        Judgment: Judgment normal.     Results for orders placed or performed in visit on 06/28/22  TSH  Result Value Ref Range   TSH 0.545 0.450 - 4.500 uIU/mL  T4, free  Result Value Ref Range   Free T4 1.01 0.82 - 1.77 ng/dL  Thyroid peroxidase antibody  Result Value Ref Range   Thyroperoxidase Ab SerPl-aCnc 12 0 - 34 IU/mL      Assessment & Plan:   Problem List Items Addressed This Visit   None Visit Diagnoses     Right foot pain    -  Primary   Will check labs to look for gout, but low concern. Check x-ray due to RA. Treat with prednisone taper. Call with any concerns.   Relevant Orders   Uric acid   DG Foot Complete Right   Comprehensive metabolic panel   CBC with Differential/Platelet        Follow up plan: Return if symptoms worsen or fail to improve.

## 2022-08-06 LAB — COMPREHENSIVE METABOLIC PANEL
ALT: 15 IU/L (ref 0–32)
AST: 14 IU/L (ref 0–40)
Albumin/Globulin Ratio: 1.6 (ref 1.2–2.2)
Albumin: 4.2 g/dL (ref 3.9–4.9)
Alkaline Phosphatase: 116 IU/L (ref 44–121)
BUN/Creatinine Ratio: 15 (ref 12–28)
BUN: 11 mg/dL (ref 8–27)
Bilirubin Total: 0.3 mg/dL (ref 0.0–1.2)
CO2: 25 mmol/L (ref 20–29)
Calcium: 9.1 mg/dL (ref 8.7–10.3)
Chloride: 104 mmol/L (ref 96–106)
Creatinine, Ser: 0.72 mg/dL (ref 0.57–1.00)
Globulin, Total: 2.7 g/dL (ref 1.5–4.5)
Glucose: 95 mg/dL (ref 70–99)
Potassium: 4.3 mmol/L (ref 3.5–5.2)
Sodium: 140 mmol/L (ref 134–144)
Total Protein: 6.9 g/dL (ref 6.0–8.5)
eGFR: 94 mL/min/{1.73_m2} (ref >59)

## 2022-08-06 LAB — CBC WITH DIFFERENTIAL/PLATELET
Basophils Absolute: 0.1 10*3/uL (ref 0.0–0.2)
Basos: 1 %
EOS (ABSOLUTE): 0.3 10*3/uL (ref 0.0–0.4)
Eos: 4 %
Hematocrit: 40.1 % (ref 34.0–46.6)
Hemoglobin: 13 g/dL (ref 11.1–15.9)
Immature Grans (Abs): 0 10*3/uL (ref 0.0–0.1)
Immature Granulocytes: 0 %
Lymphocytes Absolute: 1.2 10*3/uL (ref 0.7–3.1)
Lymphs: 18 %
MCH: 28.8 pg (ref 26.6–33.0)
MCHC: 32.4 g/dL (ref 31.5–35.7)
MCV: 89 fL (ref 79–97)
Monocytes Absolute: 0.5 10*3/uL (ref 0.1–0.9)
Monocytes: 7 %
Neutrophils Absolute: 4.7 10*3/uL (ref 1.4–7.0)
Neutrophils: 70 %
Platelets: 289 10*3/uL (ref 150–450)
RBC: 4.52 x10E6/uL (ref 3.77–5.28)
RDW: 13.6 % (ref 11.7–15.4)
WBC: 6.8 10*3/uL (ref 3.4–10.8)

## 2022-08-06 LAB — URIC ACID: Uric Acid: 4.4 mg/dL (ref 3.0–7.2)

## 2022-08-20 DIAGNOSIS — Z7185 Encounter for immunization safety counseling: Secondary | ICD-10-CM | POA: Diagnosis not present

## 2022-08-20 DIAGNOSIS — Z79899 Other long term (current) drug therapy: Secondary | ICD-10-CM | POA: Diagnosis not present

## 2022-08-20 DIAGNOSIS — R0600 Dyspnea, unspecified: Secondary | ICD-10-CM | POA: Diagnosis not present

## 2022-08-20 DIAGNOSIS — M069 Rheumatoid arthritis, unspecified: Secondary | ICD-10-CM | POA: Diagnosis not present

## 2022-08-20 DIAGNOSIS — M19072 Primary osteoarthritis, left ankle and foot: Secondary | ICD-10-CM | POA: Diagnosis not present

## 2022-08-20 DIAGNOSIS — J849 Interstitial pulmonary disease, unspecified: Secondary | ICD-10-CM | POA: Diagnosis not present

## 2022-08-20 DIAGNOSIS — M19041 Primary osteoarthritis, right hand: Secondary | ICD-10-CM | POA: Diagnosis not present

## 2022-08-20 DIAGNOSIS — M19071 Primary osteoarthritis, right ankle and foot: Secondary | ICD-10-CM | POA: Diagnosis not present

## 2022-08-20 DIAGNOSIS — M0579 Rheumatoid arthritis with rheumatoid factor of multiple sites without organ or systems involvement: Secondary | ICD-10-CM | POA: Diagnosis not present

## 2022-08-20 DIAGNOSIS — M19042 Primary osteoarthritis, left hand: Secondary | ICD-10-CM | POA: Diagnosis not present

## 2022-09-16 ENCOUNTER — Ambulatory Visit (INDEPENDENT_AMBULATORY_CARE_PROVIDER_SITE_OTHER): Payer: Medicaid Other | Admitting: Family Medicine

## 2022-09-16 ENCOUNTER — Encounter: Payer: Self-pay | Admitting: Family Medicine

## 2022-09-16 VITALS — BP 122/80 | HR 84 | Temp 98.5°F | Ht 69.0 in | Wt 312.0 lb

## 2022-09-16 DIAGNOSIS — N898 Other specified noninflammatory disorders of vagina: Secondary | ICD-10-CM

## 2022-09-16 MED ORDER — METRONIDAZOLE 500 MG PO TABS: 500.0000 mg | ORAL_TABLET | Freq: Two times a day (BID) | ORAL | 0 refills | Status: DC

## 2022-09-16 NOTE — Progress Notes (Signed)
BP 122/80   Pulse 84   Temp 98.5 F (36.9 C) (Oral)   Ht 5' 9"$  (1.753 m)   Wt (!) 312 lb (141.5 kg)   SpO2 93%   BMI 46.07 kg/m    Subjective:    Patient ID: Erin Contreras, female    DOB: 04-Feb-1961, 62 y.o.   MRN: [id number]  HPI: Erin Contreras is a 62 y.o. female  Chief Complaint  Patient presents with   Vaginal Discharge    Patient says she has noticed some vaginal discharge for the past 3 weeks. Patient says she notices a real bad smell denies any itching.    VAGINAL DISCHARGE Duration: 3-4 weeks Discharge description: white  Pruritus: no Dysuria: no Malodorous: yes Urinary frequency: no Fevers: no Abdominal pain: no  Sexual activity: not sexually active History of sexually transmitted diseases: no Recent antibiotic use: no Context: worse  Treatments attempted: antifungal and vagisil   Relevant past medical, surgical, family and social history reviewed and updated as indicated. Interim medical history since our last visit reviewed. Allergies and medications reviewed and updated.  Review of Systems  Constitutional: Negative.   Respiratory: Negative.    Cardiovascular: Negative.   Genitourinary:  Positive for vaginal discharge. Negative for decreased urine volume, difficulty urinating, dyspareunia, dysuria, enuresis, flank pain, frequency, genital sores, hematuria, menstrual problem, pelvic pain, urgency, vaginal bleeding and vaginal pain.  Musculoskeletal: Negative.   Psychiatric/Behavioral: Negative.      Per HPI unless specifically indicated above     Objective:    BP 122/80   Pulse 84   Temp 98.5 F (36.9 C) (Oral)   Ht 5' 9"$  (1.753 m)   Wt (!) 312 lb (141.5 kg)   SpO2 93%   BMI 46.07 kg/m   Wt Readings from Last 3 Encounters:  09/16/22 (!) 312 lb (141.5 kg)  08/05/22 (!) 310 lb 1.6 oz (140.7 kg)  07/22/22 300 lb (136.1 kg)    Physical Exam Vitals and nursing note reviewed.  Constitutional:      General: She is not in acute  distress.    Appearance: Normal appearance. She is well-developed. She is obese.  HENT:     Head: Normocephalic and atraumatic.     Right Ear: Hearing and external ear normal.     Left Ear: Hearing and external ear normal.     Nose: Nose normal.     Mouth/Throat:     Mouth: Mucous membranes are moist.     Pharynx: Oropharynx is clear.  Eyes:     General: Lids are normal. No scleral icterus.       Right eye: No discharge.        Left eye: No discharge.     Conjunctiva/sclera: Conjunctivae normal.  Pulmonary:     Effort: Pulmonary effort is normal. No respiratory distress.  Musculoskeletal:        General: Normal range of motion.  Skin:    Coloration: Skin is not jaundiced or pale.     Findings: No bruising, erythema, lesion or rash.  Neurological:     General: No focal deficit present.     Mental Status: She is alert and oriented to person, place, and time. Mental status is at baseline.  Psychiatric:        Mood and Affect: Mood normal.        Speech: Speech normal.        Behavior: Behavior normal.        Thought Content: Thought  content normal.        Judgment: Judgment normal.     Results for orders placed or performed in visit on 08/05/22  Uric acid  Result Value Ref Range   Uric Acid 4.4 3.0 - 7.2 mg/dL  Comprehensive metabolic panel  Result Value Ref Range   Glucose 95 70 - 99 mg/dL   BUN 11 8 - 27 mg/dL   Creatinine, Ser 0.72 0.57 - 1.00 mg/dL   eGFR 94 >59 mL/min/1.73   BUN/Creatinine Ratio 15 12 - 28   Sodium 140 134 - 144 mmol/L   Potassium 4.3 3.5 - 5.2 mmol/L   Chloride 104 96 - 106 mmol/L   CO2 25 20 - 29 mmol/L   Calcium 9.1 8.7 - 10.3 mg/dL   Total Protein 6.9 6.0 - 8.5 g/dL   Albumin 4.2 3.9 - 4.9 g/dL   Globulin, Total 2.7 1.5 - 4.5 g/dL   Albumin/Globulin Ratio 1.6 1.2 - 2.2   Bilirubin Total 0.3 0.0 - 1.2 mg/dL   Alkaline Phosphatase 116 44 - 121 IU/L   AST 14 0 - 40 IU/L   ALT 15 0 - 32 IU/L  CBC with Differential/Platelet  Result Value  Ref Range   WBC 6.8 3.4 - 10.8 x10E3/uL   RBC 4.52 3.77 - 5.28 x10E6/uL   Hemoglobin 13.0 11.1 - 15.9 g/dL   Hematocrit 40.1 34.0 - 46.6 %   MCV 89 79 - 97 fL   MCH 28.8 26.6 - 33.0 pg   MCHC 32.4 31.5 - 35.7 g/dL   RDW 13.6 11.7 - 15.4 %   Platelets 289 150 - 450 x10E3/uL   Neutrophils 70 Not Estab. %   Lymphs 18 Not Estab. %   Monocytes 7 Not Estab. %   Eos 4 Not Estab. %   Basos 1 Not Estab. %   Neutrophils Absolute 4.7 1.4 - 7.0 x10E3/uL   Lymphocytes Absolute 1.2 0.7 - 3.1 x10E3/uL   Monocytes Absolute 0.5 0.1 - 0.9 x10E3/uL   EOS (ABSOLUTE) 0.3 0.0 - 0.4 x10E3/uL   Basophils Absolute 0.1 0.0 - 0.2 x10E3/uL   Immature Granulocytes 0 Not Estab. %   Immature Grans (Abs) 0.0 0.0 - 0.1 x10E3/uL      Assessment & Plan:   Problem List Items Addressed This Visit   None Visit Diagnoses     Vaginal discharge    -  Primary   Has had recurrent BV. Will treat and await swab. Call with any concerns.   Relevant Orders   NuSwab Vaginitis Plus (VG+)        Follow up plan: Return if symptoms worsen or fail to improve.

## 2022-09-29 ENCOUNTER — Telehealth: Payer: Self-pay | Admitting: Nurse Practitioner

## 2022-09-29 NOTE — Telephone Encounter (Signed)
Pt was prescribed metroNIDAZOLE (FLAGYL) 500 MG tablet at last visit with Dr. Wynetta Emery / pt now has yeast infection and asked if Dr. Wynetta Emery or Henrine Screws can send in something to [facility] for it / please advise

## 2022-09-30 NOTE — Telephone Encounter (Signed)
Called pt to get her scheduled for an opening earlier than the follow up that is already scheduled 10/08/2022 @ 2:00 pm.  Her VM was not set up so I could not leave a message.  Will try again later and will also put in CRM.

## 2022-10-03 NOTE — Patient Instructions (Signed)

## 2022-10-04 ENCOUNTER — Other Ambulatory Visit: Payer: Self-pay

## 2022-10-04 ENCOUNTER — Encounter: Payer: Self-pay | Admitting: Emergency Medicine

## 2022-10-04 ENCOUNTER — Emergency Department: Payer: Medicaid Other

## 2022-10-04 ENCOUNTER — Emergency Department
Admission: EM | Admit: 2022-10-04 | Discharge: 2022-10-04 | Disposition: A | Discharge status: Home / Self Care | Payer: Medicaid Other | Attending: Emergency Medicine | Admitting: Emergency Medicine

## 2022-10-04 DIAGNOSIS — S39012A Strain of muscle, fascia and tendon of lower back, initial encounter: Secondary | ICD-10-CM | POA: Diagnosis not present

## 2022-10-04 DIAGNOSIS — S80919A Unspecified superficial injury of unspecified knee, initial encounter: Secondary | ICD-10-CM | POA: Diagnosis not present

## 2022-10-04 DIAGNOSIS — Y92410 Unspecified street and highway as the place of occurrence of the external cause: Secondary | ICD-10-CM | POA: Diagnosis not present

## 2022-10-04 DIAGNOSIS — I10 Essential (primary) hypertension: Secondary | ICD-10-CM | POA: Diagnosis not present

## 2022-10-04 DIAGNOSIS — S3992XA Unspecified injury of lower back, initial encounter: Secondary | ICD-10-CM | POA: Diagnosis present

## 2022-10-04 MED ORDER — CYCLOBENZAPRINE HCL 10 MG PO TABS: 10.0000 mg | ORAL_TABLET | Freq: Three times a day (TID) | ORAL | 0 refills | Status: DC | PRN

## 2022-10-04 MED ORDER — CYCLOBENZAPRINE HCL 10 MG PO TABS
10.0000 mg | ORAL_TABLET | Freq: Once | ORAL | Status: AC
  Administered 2022-10-04: 10 mg via ORAL
  Filled 2022-10-04: qty 1
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
## 2022-10-04 NOTE — Discharge Instructions (Signed)
Follow-up with your regular doctor as needed.  Follow-up with orthopedics if not improving in 1 week.  Take Flexeril as prescribed.  Tylenol or ibuprofen for pain as needed.  Apply ice to your lower back.  Return if worse
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
## 2022-10-07 ENCOUNTER — Ambulatory Visit
Admission: RE | Admit: 2022-10-07 | Discharge: 2022-10-07 | Disposition: A | Discharge status: Home / Self Care | Payer: Medicaid Other | Source: Ambulatory Visit | Attending: Nurse Practitioner | Admitting: Nurse Practitioner

## 2022-10-07 DIAGNOSIS — Z1231 Encounter for screening mammogram for malignant neoplasm of breast: Secondary | ICD-10-CM

## 2022-10-08 ENCOUNTER — Encounter: Payer: Self-pay | Admitting: Nurse Practitioner

## 2022-10-08 ENCOUNTER — Ambulatory Visit (INDEPENDENT_AMBULATORY_CARE_PROVIDER_SITE_OTHER): Payer: Medicaid Other | Admitting: Nurse Practitioner

## 2022-10-08 VITALS — BP 120/83 | HR 88 | Temp 97.8°F | Ht 69.02 in | Wt 310.2 lb

## 2022-10-08 DIAGNOSIS — Z6841 Body Mass Index (BMI) 40.0 and over, adult: Secondary | ICD-10-CM

## 2022-10-08 DIAGNOSIS — E538 Deficiency of other specified B group vitamins: Secondary | ICD-10-CM | POA: Diagnosis not present

## 2022-10-08 DIAGNOSIS — K449 Diaphragmatic hernia without obstruction or gangrene: Secondary | ICD-10-CM

## 2022-10-08 DIAGNOSIS — N760 Acute vaginitis: Secondary | ICD-10-CM

## 2022-10-08 DIAGNOSIS — G894 Chronic pain syndrome: Secondary | ICD-10-CM | POA: Diagnosis not present

## 2022-10-08 DIAGNOSIS — E7800 Pure hypercholesterolemia, unspecified: Secondary | ICD-10-CM | POA: Diagnosis not present

## 2022-10-08 DIAGNOSIS — M0579 Rheumatoid arthritis with rheumatoid factor of multiple sites without organ or systems involvement: Secondary | ICD-10-CM | POA: Diagnosis not present

## 2022-10-08 DIAGNOSIS — I10 Essential (primary) hypertension: Secondary | ICD-10-CM

## 2022-10-08 DIAGNOSIS — F321 Major depressive disorder, single episode, moderate: Secondary | ICD-10-CM

## 2022-10-08 DIAGNOSIS — R7303 Prediabetes: Secondary | ICD-10-CM | POA: Diagnosis not present

## 2022-10-08 DIAGNOSIS — E559 Vitamin D deficiency, unspecified: Secondary | ICD-10-CM | POA: Diagnosis not present

## 2022-10-08 DIAGNOSIS — B9689 Other specified bacterial agents as the cause of diseases classified elsewhere: Secondary | ICD-10-CM | POA: Diagnosis not present

## 2022-10-08 DIAGNOSIS — R7989 Other specified abnormal findings of blood chemistry: Secondary | ICD-10-CM | POA: Diagnosis not present

## 2022-10-08 DIAGNOSIS — G4733 Obstructive sleep apnea (adult) (pediatric): Secondary | ICD-10-CM

## 2022-10-08 DIAGNOSIS — K2100 Gastro-esophageal reflux disease with esophagitis, without bleeding: Secondary | ICD-10-CM | POA: Diagnosis not present

## 2022-10-08 LAB — BAYER DCA HB A1C WAIVED: HB A1C (BAYER DCA - WAIVED): 6.2 % — ABNORMAL HIGH (ref 4.8–5.6)

## 2022-10-08 LAB — MICROSCOPIC EXAMINATION

## 2022-10-08 LAB — URINALYSIS, ROUTINE W REFLEX MICROSCOPIC
Bilirubin, UA: NEGATIVE
Glucose, UA: NEGATIVE
Ketones, UA: NEGATIVE
Nitrite, UA: NEGATIVE
RBC, UA: NEGATIVE
Specific Gravity, UA: 1.03 (ref 1.005–1.030)
Urobilinogen, Ur: 0.2 mg/dL (ref 0.2–1.0)
pH, UA: 5 (ref 5.0–7.5)

## 2022-10-08 LAB — WET PREP FOR TRICH, YEAST, CLUE
Clue Cell Exam: POSITIVE — AB
Trichomonas Exam: NEGATIVE
Yeast Exam: NEGATIVE

## 2022-10-08 MED ORDER — METRONIDAZOLE 0.75 % VA GEL: VAGINAL | 4 refills | Status: DC

## 2022-10-08 NOTE — Assessment & Plan Note (Signed)
Chronic, ongoing,  Continue Protonix at this time due to ongoing symptoms and hiatal hernia.  Referral to GI placed due to ongoing symptoms and poor control.  Risks of PPI use were discussed with patient including bone loss, C. Diff diarrhea, pneumonia, infections, CKD, electrolyte abnormalities.  Verbalizes understanding and chooses to continue the medication.

## 2022-10-08 NOTE — Assessment & Plan Note (Signed)
A1c 6.2% last visit, recheck today.  Continue diet monitoring and initiate medication as needed.
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
## 2022-10-08 NOTE — Assessment & Plan Note (Signed)
Noted on past labs, recheck TSH and Free T4 today -- consider endo referral if ongoing lows. 

## 2022-10-08 NOTE — Assessment & Plan Note (Signed)
Chronic, ongoing, followed by pain management.  Continue this collaboration and current treatment regimen as ordered by them.  Recent notes reviewed.   

## 2022-10-08 NOTE — Assessment & Plan Note (Signed)
Noted on imaging 04/21/20 and suspect main cause of her ongoing heart burn.  Continue Protonix at this time and place referral to GI due to ongoing symptoms being present daily.  Risks of PPI use were discussed with patient including bone loss, C. Diff diarrhea, pneumonia, infections, CKD, electrolyte abnormalities.  Verbalizes understanding and chooses to continue the medication.

## 2022-10-08 NOTE — Assessment & Plan Note (Signed)
Chronic, ongoing -- noted on labs, ASCVD 6% and last LDL <190.  Continue diet focus at home. Lipid panel today.

## 2022-10-08 NOTE — Assessment & Plan Note (Signed)
BMI 45.79.  Recommended eating smaller high protein, low fat meals more frequently and exercising 30 mins a day 5 times a week with a goal of 10-15lb weight loss in the next 3 months. Patient voiced their understanding and motivation to adhere to these recommendations.

## 2022-10-08 NOTE — Assessment & Plan Note (Signed)
Instructed to use CPAP consistently for overall health benefits.  She currently has not used her CPAP for years and discussed with her she may benefit from repeat study in upcoming months.

## 2022-10-08 NOTE — Assessment & Plan Note (Signed)
Chronic, ongoing.  At this time recommend she continue supplement daily 1000 MCG daily.  Educated her on this and benefit. 

## 2022-10-08 NOTE — Assessment & Plan Note (Signed)
Ongoing, continue Duloxetine which benefits both mood and pain.  Denies SI/HI.  Discussed addition of therapy or Wellbutrin, at this time wishes to continue current regimen and will adjust as needed.

## 2022-10-08 NOTE — Assessment & Plan Note (Signed)
Ongoing.  Continue supplement and recommend ensure she is taking 2000 units daily.  Recheck today. 

## 2022-10-08 NOTE — Assessment & Plan Note (Signed)
Chronic, stable without medication.  Recommend she monitor BP at least a few mornings a week at home and document.  DASH diet at home.  Continue current diet focus.  Labs today: CMP, TSH.

## 2022-10-08 NOTE — Assessment & Plan Note (Signed)
Recurrent, suspect related to vaginal atrophy and rheumatology infusions.  Ongoing infections present.  Will trial Flagyl gel to vagina twice a week for the next 4-6 months.  Educated her on this.  If ongoing plan on referral to GYN.  Return in 3 months for recheck.

## 2022-10-08 NOTE — Assessment & Plan Note (Signed)
Chronic, ongoing, followed by rheumatology and pain clinic.  Continue these collaborations, recent notes reviewed.  Labs today: CMP, TSH.

## 2022-10-10 LAB — LIPID PANEL W/O CHOL/HDL RATIO
Cholesterol, Total: 207 mg/dL — ABNORMAL HIGH (ref 100–199)
HDL: 65 mg/dL (ref >39)
LDL Chol Calc (NIH): 116 mg/dL — ABNORMAL HIGH (ref 0–99)
Triglycerides: 151 mg/dL — ABNORMAL HIGH (ref 0–149)
VLDL Cholesterol Cal: 26 mg/dL (ref 5–40)

## 2022-10-10 LAB — COMPREHENSIVE METABOLIC PANEL
ALT: 20 IU/L (ref 0–32)
AST: 18 IU/L (ref 0–40)
Albumin/Globulin Ratio: 1.3 (ref 1.2–2.2)
Albumin: 4 g/dL (ref 3.9–4.9)
Alkaline Phosphatase: 125 IU/L — ABNORMAL HIGH (ref 44–121)
BUN/Creatinine Ratio: 20 (ref 12–28)
BUN: 19 mg/dL (ref 8–27)
Bilirubin Total: 0.3 mg/dL (ref 0.0–1.2)
CO2: 21 mmol/L (ref 20–29)
Calcium: 9.3 mg/dL (ref 8.7–10.3)
Chloride: 102 mmol/L (ref 96–106)
Creatinine, Ser: 0.94 mg/dL (ref 0.57–1.00)
Globulin, Total: 3.1 g/dL (ref 1.5–4.5)
Glucose: 77 mg/dL (ref 70–99)
Potassium: 4.1 mmol/L (ref 3.5–5.2)
Sodium: 140 mmol/L (ref 134–144)
Total Protein: 7.1 g/dL (ref 6.0–8.5)
eGFR: 68 mL/min/{1.73_m2} (ref >59)

## 2022-10-10 LAB — VITAMIN B12: Vitamin B-12: 489 pg/mL (ref 232–1245)

## 2022-10-10 LAB — MAGNESIUM: Magnesium: 2 mg/dL (ref 1.6–2.3)

## 2022-10-10 LAB — TSH: TSH: 0.613 u[IU]/mL (ref 0.450–4.500)

## 2022-10-10 LAB — T4, FREE: Free T4: 1.04 ng/dL (ref 0.82–1.77)

## 2022-10-10 LAB — VITAMIN D 25 HYDROXY (VIT D DEFICIENCY, FRACTURES): Vit D, 25-Hydroxy: 21.1 ng/mL — ABNORMAL LOW (ref 30.0–100.0)

## 2022-10-11 ENCOUNTER — Other Ambulatory Visit: Payer: Self-pay | Admitting: Nurse Practitioner

## 2022-10-11 DIAGNOSIS — J849 Interstitial pulmonary disease, unspecified: Secondary | ICD-10-CM | POA: Diagnosis not present

## 2022-10-11 MED ORDER — CHOLECALCIFEROL 1.25 MG (50000 UT) PO TABS: 1.0000 | ORAL_TABLET | ORAL | 4 refills | Status: DC

## 2022-10-11 NOTE — Progress Notes (Signed)
Good day, please let Ayala know labs have returned: - Kidney and liver function are normal. - Cholesterol levels remain elevated, we can continue focus on diet and weight loss at this time but may need medication for this in future. - Vitamin D level remains low, I am going to send in higher weekly dose Vitamin D3 to start taking to help these levels improve for bone health. - Remainder of labs all stable.  Any questions? Keep being amazing!!  Thank you for allowing me to participate in your care.  I appreciate you. Kindest regards, Shanikqua Zarzycki

## 2022-10-14 ENCOUNTER — Encounter: Payer: Self-pay | Admitting: Student in an Organized Health Care Education/Training Program

## 2022-10-14 ENCOUNTER — Ambulatory Visit
Payer: Medicaid Other | Attending: Student in an Organized Health Care Education/Training Program | Admitting: Student in an Organized Health Care Education/Training Program

## 2022-10-14 VITALS — BP 126/77 | HR 98 | Temp 97.2°F | Ht 69.0 in | Wt 311.0 lb

## 2022-10-14 DIAGNOSIS — G894 Chronic pain syndrome: Secondary | ICD-10-CM

## 2022-10-14 DIAGNOSIS — M62838 Other muscle spasm: Secondary | ICD-10-CM | POA: Insufficient documentation

## 2022-10-14 DIAGNOSIS — M47818 Spondylosis without myelopathy or radiculopathy, sacral and sacrococcygeal region: Secondary | ICD-10-CM | POA: Insufficient documentation

## 2022-10-14 DIAGNOSIS — G5701 Lesion of sciatic nerve, right lower limb: Secondary | ICD-10-CM | POA: Insufficient documentation

## 2022-10-14 DIAGNOSIS — M792 Neuralgia and neuritis, unspecified: Secondary | ICD-10-CM | POA: Diagnosis present

## 2022-10-14 DIAGNOSIS — G5702 Lesion of sciatic nerve, left lower limb: Secondary | ICD-10-CM | POA: Insufficient documentation

## 2022-10-14 DIAGNOSIS — M533 Sacrococcygeal disorders, not elsewhere classified: Secondary | ICD-10-CM | POA: Insufficient documentation

## 2022-10-14 DIAGNOSIS — G5703 Lesion of sciatic nerve, bilateral lower limbs: Secondary | ICD-10-CM | POA: Diagnosis not present

## 2022-10-14 MED ORDER — DULOXETINE HCL 20 MG PO CPEP: 40.0000 mg | ORAL_CAPSULE | Freq: Every day | ORAL | 5 refills | Status: DC

## 2022-10-14 MED ORDER — PREGABALIN 100 MG PO CAPS: 100.0000 mg | ORAL_CAPSULE | Freq: Every evening | ORAL | 5 refills | Status: DC | PRN
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
## 2022-10-14 NOTE — Progress Notes (Signed)
Safety precautions to be maintained throughout the outpatient stay will include: orient to surroundings, keep bed in low position, maintain call bell within reach at all times, provide assistance with transfer out of bed and ambulation.  

## 2022-11-18 NOTE — Progress Notes (Deleted)
Office Visit Note  Patient: Erin Contreras             Date of Birth: 11-Aug-1960           MRN: [id number]             PCP: Marjie Skiff, NP Referring: Marjie Skiff, NP Visit Date: 12/02/2022 Occupation: @  Subjective:  No chief complaint on file.   History of Present Illness: TERREA [name] is a 62 y.o. female ***   Treatment history: - Diagnosed 2014: + RF and CCP; chronic changes of the hands and wrist  - Humira, Enbrel (01/2020--11/2019) without efficacy - Off Methotrexate 20 mg weekly due to cost - Off [facility] due to cost  - Rinvoq started June 2023   Activities of Daily Living:  Patient reports morning stiffness for *** {minute/hour:19697}.   Patient {ACTIONS;DENIES/REPORTS:21021675::"Denies"} nocturnal pain.  Difficulty dressing/grooming: {ACTIONS;DENIES/REPORTS:21021675::"Denies"} Difficulty climbing stairs: {ACTIONS;DENIES/REPORTS:21021675::"Denies"} Difficulty getting out of chair: {ACTIONS;DENIES/REPORTS:21021675::"Denies"} Difficulty using hands for taps, buttons, cutlery, and/or writing: {ACTIONS;DENIES/REPORTS:21021675::"Denies"}  No Rheumatology ROS completed.   PMFS History:  Patient Active Problem List   Diagnosis Date Noted   Bacterial vaginosis 10/08/2022   Elevated low density lipoprotein (LDL) cholesterol level 04/09/2022   B12 deficiency 09/16/2021   Low serum thyroid stimulating hormone (TSH) 09/16/2021   Adrenal adenoma 03/09/2021   Hiatal hernia 03/09/2021   Total knee replacement status 01/28/2021   Neurogenic pain 12/30/2020   Depression, major, single episode, moderate 10/17/2020   Chronic radicular lumbar pain 05/06/2020   Lumbar spondylosis 05/06/2020   Lumbar degenerative disc disease 05/06/2020   SI joint arthritis 05/06/2020   Chronic pain syndrome 05/06/2020   Prediabetes 03/21/2019   Vitamin D deficiency 12/30/2017   Benign hypertension 07/11/2017   Obstructive sleep apnea of adult 04/27/2017    Multiple nodules of lung 04/27/2017   Herpes simplex virus infection 03/17/2017   OA (osteoarthritis) of knee    Rheumatoid arthritis involving multiple sites with positive rheumatoid factor    Obesity    Urge and stress incontinence    Gastroesophageal reflux disease with esophagitis 05/24/2014    Past Medical History:  Diagnosis Date   Boils    Ear mass    GERD (gastroesophageal reflux disease)    Hemorrhoids    Herpes simplex virus infection    HSV infection    Lumbago with sciatica    OA (osteoarthritis) of knee    Obesity    Overactive bladder    PONV (postoperative nausea and vomiting)    Pre-diabetes    RA (rheumatoid arthritis) ([facility])    Sleep apnea    does not use cpap-had bariatric surgery and has no issues since surgery   Urge and stress incontinence     Family History  Problem Relation Age of Onset   Cancer Mother        liver   Diabetes Mother    Hyperlipidemia Mother    Hypertension Mother    Cirrhosis Father        liver   Hypertension Daughter    Stroke Maternal Grandmother    Stroke Maternal Grandfather    Heart disease Paternal Grandmother    Diabetes Paternal Grandmother    Heart disease Paternal Grandfather    Diabetes Paternal Grandfather    Past Surgical History:  Procedure Laterality Date   CHOLECYSTECTOMY  2011   COLONOSCOPY N/A 03/19/2021   Procedure: COLONOSCOPY;  Surgeon: Wyline Mood, MD;  [facility]: [facility] ENDOSCOPY;  Service: Gastroenterology;  Laterality: N/A;   FRACTURE SURGERY Right    ankle as a child   JOINT REPLACEMENT Left 01/28/2021   KNEE ARTHROPLASTY Right 07/11/2017   Procedure: COMPUTER ASSISTED TOTAL KNEE ARTHROPLASTY;  Surgeon: Donato Heinz, MD;  Location: [facility] ORS;  Service: Orthopedics;  Laterality: Right;   KNEE ARTHROPLASTY Left 01/28/2021   Procedure: COMPUTER ASSISTED TOTAL KNEE ARTHROPLASTY;  Surgeon: Donato Heinz, MD;  Location: [facility] ORS;  Service: Orthopedics;  Laterality: Left;   SLEEVE GASTROPLASTY      TUMOR REMOVAL Right 2010   ear   Social History   Social History Narrative   Not on file   Immunization History  Administered Date(s) Administered   Influenza,inj,Quad PF,6+ Mos 04/17/2020, 05/25/2022   Influenza-Unspecified 05/03/2016, 04/17/2018, 05/17/2019, 05/20/2021   Moderna Sars-Covid-2 Vaccination 11/02/2019, 12/06/2019, 08/21/2020   PNEUMOCOCCAL CONJUGATE-20 08/20/2022   Td 02/05/2004, 12/30/2017     Objective: Vital Signs: There were no vitals taken for this visit.   Physical Exam   Musculoskeletal Exam: ***  CDAI Exam: CDAI Score: -- Patient Global: --; Provider Global: -- Swollen: --; Tender: -- Joint Exam 12/02/2022   No joint exam has been documented for this visit   There is currently no information documented on the homunculus. Go to the Rheumatology activity and complete the homunculus joint exam.  Investigation: No additional findings.  Imaging: No results found.  Recent Labs: Lab Results  Component Value Date   WBC 6.8 08/05/2022   HGB 13.0 08/05/2022   PLT 289 08/05/2022   NA 140 10/08/2022   K 4.1 10/08/2022   CL 102 10/08/2022   CO2 21 10/08/2022   GLUCOSE 77 10/08/2022   BUN 19 10/08/2022   CREATININE 0.94 10/08/2022   BILITOT 0.3 10/08/2022   ALKPHOS 125 (H) 10/08/2022   AST 18 10/08/2022   ALT 20 10/08/2022   PROT 7.1 10/08/2022   ALBUMIN 4.0 10/08/2022   CALCIUM 9.3 10/08/2022   GFRAA 102 06/12/2020    Speciality Comments: No specialty comments available.  Procedures:  No procedures performed Allergies: Patient has no known allergies.   Assessment / Plan:     Visit Diagnoses: Rheumatoid arthritis involving multiple sites with positive rheumatoid factor - Dr. Allena Katz.  High risk medication use - Rinvoq 15 mg 1 tablet by mouth daily  Chronic pain syndrome - Lyrica  Lumbar spondylosis  Chronic radicular lumbar pain  Primary osteoarthritis of right knee  SI joint arthritis  Adenoma of right adrenal  gland  Gastroesophageal reflux disease with esophagitis without hemorrhage  Obstructive sleep apnea of adult  Multiple nodules of lung  Hiatal hernia  Benign hypertension  Depression, major, single episode, moderate  Elevated low density lipoprotein (LDL) cholesterol level  Herpes simplex virus infection  Urge and stress incontinence  Vitamin D deficiency  Prediabetes  Orders: No orders of the defined types were placed in this encounter.  No orders of the defined types were placed in this encounter.   Face-to-face time spent with patient was *** minutes. Greater than 50% of time was spent in counseling and coordination of care.  Follow-Up Instructions: No follow-ups on file.   Gearldine Bienenstock, PA-C  Note - This record has been created using Dragon software.  Chart creation errors have been sought, but may not always  have been located. Such creation errors do not reflect on  the standard of medical care.

## 2022-11-19 DIAGNOSIS — M159 Polyosteoarthritis, unspecified: Secondary | ICD-10-CM | POA: Diagnosis not present

## 2022-11-19 DIAGNOSIS — M059 Rheumatoid arthritis with rheumatoid factor, unspecified: Secondary | ICD-10-CM | POA: Diagnosis not present

## 2022-12-02 ENCOUNTER — Encounter: Payer: Medicaid Other | Admitting: Rheumatology

## 2022-12-02 DIAGNOSIS — E559 Vitamin D deficiency, unspecified: Secondary | ICD-10-CM

## 2022-12-02 DIAGNOSIS — I10 Essential (primary) hypertension: Secondary | ICD-10-CM

## 2022-12-02 DIAGNOSIS — E7800 Pure hypercholesterolemia, unspecified: Secondary | ICD-10-CM

## 2022-12-02 DIAGNOSIS — K2100 Gastro-esophageal reflux disease with esophagitis, without bleeding: Secondary | ICD-10-CM

## 2022-12-02 DIAGNOSIS — M47818 Spondylosis without myelopathy or radiculopathy, sacral and sacrococcygeal region: Secondary | ICD-10-CM

## 2022-12-02 DIAGNOSIS — M0579 Rheumatoid arthritis with rheumatoid factor of multiple sites without organ or systems involvement: Secondary | ICD-10-CM

## 2022-12-02 DIAGNOSIS — K449 Diaphragmatic hernia without obstruction or gangrene: Secondary | ICD-10-CM

## 2022-12-02 DIAGNOSIS — R918 Other nonspecific abnormal finding of lung field: Secondary | ICD-10-CM

## 2022-12-02 DIAGNOSIS — D3501 Benign neoplasm of right adrenal gland: Secondary | ICD-10-CM

## 2022-12-02 DIAGNOSIS — G894 Chronic pain syndrome: Secondary | ICD-10-CM

## 2022-12-02 DIAGNOSIS — Z79899 Other long term (current) drug therapy: Secondary | ICD-10-CM

## 2022-12-02 DIAGNOSIS — N3946 Mixed incontinence: Secondary | ICD-10-CM

## 2022-12-02 DIAGNOSIS — G8929 Other chronic pain: Secondary | ICD-10-CM

## 2022-12-02 DIAGNOSIS — Z96653 Presence of artificial knee joint, bilateral: Secondary | ICD-10-CM

## 2022-12-02 DIAGNOSIS — M1711 Unilateral primary osteoarthritis, right knee: Secondary | ICD-10-CM

## 2022-12-02 DIAGNOSIS — F321 Major depressive disorder, single episode, moderate: Secondary | ICD-10-CM

## 2022-12-02 DIAGNOSIS — R7303 Prediabetes: Secondary | ICD-10-CM

## 2022-12-02 DIAGNOSIS — G4733 Obstructive sleep apnea (adult) (pediatric): Secondary | ICD-10-CM

## 2022-12-02 DIAGNOSIS — M47816 Spondylosis without myelopathy or radiculopathy, lumbar region: Secondary | ICD-10-CM

## 2022-12-02 DIAGNOSIS — B009 Herpesviral infection, unspecified: Secondary | ICD-10-CM

## 2022-12-29 ENCOUNTER — Ambulatory Visit: Payer: Medicaid Other | Admitting: Rheumatology

## 2023-01-10 ENCOUNTER — Ambulatory Visit: Payer: Medicaid Other | Admitting: Nurse Practitioner

## 2023-01-18 ENCOUNTER — Other Ambulatory Visit: Payer: Self-pay

## 2023-01-18 ENCOUNTER — Encounter: Payer: Self-pay | Admitting: Gastroenterology

## 2023-01-18 ENCOUNTER — Ambulatory Visit (INDEPENDENT_AMBULATORY_CARE_PROVIDER_SITE_OTHER): Payer: Medicaid Other | Admitting: Gastroenterology

## 2023-01-18 VITALS — BP 133/85 | HR 81 | Temp 98.2°F | Ht 69.0 in | Wt 312.2 lb

## 2023-01-18 DIAGNOSIS — R12 Heartburn: Secondary | ICD-10-CM

## 2023-01-18 DIAGNOSIS — R1319 Other dysphagia: Secondary | ICD-10-CM

## 2023-01-18 NOTE — Progress Notes (Signed)
Erin Mood MD, MRCP(U.K) [address]  [address]  [place], [place] [address]  Main: [phone]  Fax: [phone]   Gastroenterology Consultation  Referring Provider:     Marjie Skiff, NP Primary Care Physician:  Marjie Skiff, NP Primary Gastroenterologist:  Dr. Wyline Contreras  Reason for Consultation:    GERD        HPI:   Erin Contreras is a 62 y.o. y/o female referred for consultation & management  by Marjie Skiff, NP.    She has been referred for ongoing heartburn despite being on 40 mg of Protonix twice daily has had workup for chest pain from the cardiac standpoint which has been negative.  03/19/2021: Colonoscopy: No polyps repeat in 10 years No recent abdominal or chest imaging. 12/28/2022 hemoglobin 12.6 g, CMP normal.  State.  s that she has a issues with reflux for many years.  Takes her Protonix before meals daily.  But she lays down flat right after she eats.  She has had no change in her weight recently.  Some issues with swallowing particularly for solids.  Complains of heartburn which wakes her up in the middle of the night Past Medical History:  Diagnosis Date   Boils    Ear mass    GERD (gastroesophageal reflux disease)    Hemorrhoids    Herpes simplex virus infection    HSV infection    Lumbago with sciatica    OA (osteoarthritis) of knee    Obesity    Overactive bladder    PONV (postoperative nausea and vomiting)    Pre-diabetes    RA (rheumatoid arthritis) ([facility])    Sleep apnea    does not use cpap-had bariatric surgery and has no issues since surgery   Urge and stress incontinence     Past Surgical History:  Procedure Laterality Date   CHOLECYSTECTOMY  2011   COLONOSCOPY N/A 03/19/2021   Procedure: COLONOSCOPY;  Surgeon: Erin Mood, MD;  Location: [facility] ENDOSCOPY;  Service: Gastroenterology;  Laterality: N/A;   FRACTURE SURGERY Right    ankle as a child   JOINT REPLACEMENT Left 01/28/2021   KNEE ARTHROPLASTY Right  07/11/2017   Procedure: COMPUTER ASSISTED TOTAL KNEE ARTHROPLASTY;  Surgeon: Donato Heinz, MD;  Location: [facility] ORS;  Service: Orthopedics;  Laterality: Right;   KNEE ARTHROPLASTY Left 01/28/2021   Procedure: COMPUTER ASSISTED TOTAL KNEE ARTHROPLASTY;  Surgeon: Donato Heinz, MD;  Location: [facility] ORS;  Service: Orthopedics;  Laterality: Left;   SLEEVE GASTROPLASTY     TUMOR REMOVAL Right 2010   ear    Prior to Admission medications   Medication Sig Start Date End Date Taking? Authorizing Provider  acyclovir (ZOVIRAX) 400 MG tablet Take 1 tablet (400 mg total) by mouth 2 (two) times daily as needed. 04/09/22   Cannady, Corrie Dandy T, NP  Cholecalciferol (VITAMIN D3) 1.25 MG (50000 UT) CAPS Take 1 capsule by mouth once a week. 01/01/23   [provider]  Cholecalciferol 1.25 MG (50000 UT) TABS Take 1 tablet by mouth once a week. 10/11/22   Cannady, Corrie Dandy T, NP  cyanocobalamin (VITAMIN B12) 1000 MCG tablet Take 1,000 mcg by mouth daily.    [provider]  cyclobenzaprine (FLEXERIL) 10 MG tablet Take 1 tablet (10 mg total) by mouth 3 (three) times daily as needed. 10/04/22   Sherrie Mustache Roselyn Bering, PA-C  DULoxetine (CYMBALTA) 20 MG capsule Take 2 capsules (40 mg total) by mouth daily after breakfast. 10/14/22 04/12/23  Edward Jolly, MD  folic acid (FOLVITE) 1 MG tablet Take 1 tablet by mouth daily. 11/25/22 11/25/23  [provider]  hydroxychloroquine (PLAQUENIL) 200 MG tablet Take 200 mg by mouth 2 (two) times daily.    [provider]  Magnesium 500 MG CAPS Take 1 capsule (500 mg total) by mouth at bedtime. 07/22/22 01/18/23  Edward Jolly, MD  meloxicam (MOBIC) 15 MG tablet Take 15 mg by mouth daily.    [provider]  methotrexate (RHEUMATREX) 2.5 MG tablet Take 15 mg by mouth once a week.    [provider]  metroNIDAZOLE (METROGEL) 0.75 % vaginal gel Place application twice a week into vaginal area for the next 6 months. 10/08/22   Cannady, Corrie Dandy T, NP   pantoprazole (PROTONIX) 40 MG tablet Take 1 tablet (40 mg total) by mouth 2 (two) times daily. 04/30/22   Marykay Lex, MD  pregabalin (LYRICA) 100 MG capsule Take 1 capsule (100 mg total) by mouth at bedtime as needed. 10/14/22 04/12/23  Edward Jolly, MD  Upadacitinib ER (RINVOQ) 15 MG TB24 Take 1 tablet by mouth daily.    [provider]    Family History  Problem Relation Age of Onset   Cancer Mother        liver   Diabetes Mother    Hyperlipidemia Mother    Hypertension Mother    Cirrhosis Father        liver   Hypertension Daughter    Stroke Maternal Grandmother    Stroke Maternal Grandfather    Heart disease Paternal Grandmother    Diabetes Paternal Grandmother    Heart disease Paternal Grandfather    Diabetes Paternal Grandfather      Social History   Tobacco Use   Smoking status: Never   Smokeless tobacco: Never  Vaping Use   Vaping Use: Never used  Substance Use Topics   Alcohol use: No   Drug use: No    Allergies as of 01/18/2023   (No Known Allergies)    Review of Systems:    All systems reviewed and negative except where noted in HPI.   Physical Exam:  BP 133/85   Pulse 81   Temp 98.2 F (36.8 C) (Oral)   Ht 5\' 9"  (1.753 m)   Wt (!) 312 lb 3.2 oz (141.6 kg)   BMI 46.10 kg/m  No LMP recorded. Patient is postmenopausal. Psych:  Alert and cooperative. Normal Contreras and affect. General:   Alert,  Well-developed, well-nourished, pleasant and cooperative in NAD Head:  Normocephalic and atraumatic. Eyes:  Sclera clear, no icterus.   Conjunctiva pink. Ears:  Normal auditory acuity.    Neurologic:  Alert and oriented x3;  grossly normal neurologically. Psych:  Alert and cooperative. Normal Contreras and affect.  Imaging Studies: No results found.  Assessment and Plan:   Erin Contreras is a 62 y.o. y/o female has been referred for heartburn despite being on Protonix 40 mg twice daily in addition she has some issues with dysphagia.  Likely due  to the reflux may have underlying stricture.   Plan 1.  EGD to rule out eosinophilic esophagitis.  Discussed about lifestyle changes for acid reflux including weight loss, timing of meals, use of a wedge pillow, avoid eating for 2 hours before bedtime.  Patient information will be provided. 2.   Stop PPI trial of Voquenza 20 mg a day samples will be provided for 2 weeks if it works she should call us for prescription  I have discussed alternative options, risks & benefits,  which include, but are not limited to, bleeding, infection, perforation,respiratory complication & drug reaction.  The patient agrees with this plan & written consent will be obtained.     Follow up in 8 to 12 weeks  Dr Erin Mood MD,MRCP(U.K)

## 2023-01-18 NOTE — Patient Instructions (Addendum)
Please take Voquezna 20 MG once a ay. After taking for 1 week, please call us and let us know so we could ask your insurance company for a prior-authorization or if it doesn't work for you.    Food Choices for Gastroesophageal Reflux Disease, Adult When you have gastroesophageal reflux disease (GERD), the foods you eat and your eating habits are very important. Choosing the right foods can help ease your discomfort. Think about working with a food expert (dietitian) to help you make good choices. What are tips for following this plan? Reading food labels Look for foods that are low in saturated fat. Foods that may help with your symptoms include: Foods that have less than 5% of daily value (DV) of fat. Foods that have 0 grams of trans fat. Cooking Do not fry your food. Cook your food by baking, steaming, grilling, or broiling. These are all methods that do not need a lot of fat for cooking. To add flavor, try to use herbs that are low in spice and acidity. Meal planning  Choose healthy foods that are low in fat, such as: Fruits and vegetables. Whole grains. Low-fat dairy products. Lean meats, fish, and poultry. Eat small meals often instead of eating 3 large meals each day. Eat your meals slowly in a place where you are relaxed. Avoid bending over or lying down until 2-3 hours after eating. Limit high-fat foods such as fatty meats or fried foods. Limit your intake of fatty foods, such as oils, butter, and shortening. Avoid the following as told by your doctor: Foods that cause symptoms. These may be different for different people. Keep a food diary to keep track of foods that cause symptoms. Alcohol. Drinking a lot of liquid with meals. Eating meals during the 2-3 hours before bed. Lifestyle Stay at a healthy weight. Ask your doctor what weight is healthy for you. If you need to lose weight, work with your doctor to do so safely. Exercise for at least 30 minutes on 5 or more days each  week, or as told by your doctor. Wear loose-fitting clothes. Do not smoke or use any products that contain nicotine or tobacco. If you need help quitting, ask your doctor. Sleep with the head of your bed higher than your feet. Use a wedge under the mattress or blocks under the bed frame to raise the head of the bed. Chew sugar-free gum after meals. What foods should eat?  Eat a healthy, well-balanced diet of fruits, vegetables, whole grains, low-fat dairy products, lean meats, fish, and poultry. Each person is different. Foods that may cause symptoms in one person may not cause any symptoms in another person. Work with your doctor to find foods that are safe for you. The items listed above may not be a complete list of what you can eat and drink. Contact a food expert for more options. What foods should I avoid? Limiting some of these foods may help in managing the symptoms of GERD. Everyone is different. Talk with a food expert or your doctor to help you find the exact foods to avoid, if any. Fruits Any fruits prepared with added fat. Any fruits that cause symptoms. For some people, this may include citrus fruits, such as oranges, grapefruit, pineapple, and lemons. Vegetables Deep-fried vegetables. [place] fries. Any vegetables prepared with added fat. Any vegetables that cause symptoms. For some people, this may include tomatoes and tomato products, chili peppers, onions and garlic, and horseradish. Grains Pastries or quick breads with added  fat. Meats and other proteins High-fat meats, such as fatty beef or pork, hot dogs, ribs, ham, sausage, salami, and bacon. Fried meat or protein, including fried fish and fried chicken. Nuts and nut butters, in large amounts. Dairy Whole milk and chocolate milk. Sour cream. Cream. Ice cream. Cream cheese. Milkshakes. Fats and oils Butter. Margarine. Shortening. Ghee. Beverages Coffee and tea, with or without caffeine. Carbonated beverages. Sodas. Energy  drinks. Fruit juice made with acidic fruits, such as orange or grapefruit. Tomato juice. Alcoholic drinks. Sweets and desserts Chocolate and cocoa. Donuts. Seasonings and condiments Pepper. Peppermint and spearmint. Added salt. Any condiments, herbs, or seasonings that cause symptoms. For some people, this may include curry, hot sauce, or vinegar-based salad dressings. The items listed above may not be a complete list of what you should not eat and drink. Contact a food expert for more options. Questions to ask your doctor Diet and lifestyle changes are often the first steps that are taken to manage symptoms of GERD. If diet and lifestyle changes do not help, talk with your doctor about taking medicines. Where to find more information International Foundation for Gastrointestinal Disorders: aboutgerd.org Summary When you have GERD, food and lifestyle choices are very important in easing your symptoms. Eat small meals often instead of 3 large meals a day. Eat your meals slowly and in a place where you are relaxed. Avoid bending over or lying down until 2-3 hours after eating. Limit high-fat foods such as fatty meats or fried foods. This information is not intended to replace advice given to you by your health care provider. Make sure you discuss any questions you have with your health care provider. Document Revised: 01/28/2020 Document Reviewed: 01/28/2020 Elsevier Patient Education  2024 [employer].

## 2023-01-20 ENCOUNTER — Telehealth: Payer: Self-pay

## 2023-01-20 NOTE — Telephone Encounter (Signed)
Call pt to make her aware that per [facility] her insurance will expire on 03-02-2023. Per pt she will renew policy tomorrow and call me back. Pt wants to keep procedure date as 03-09-2023

## 2023-01-20 NOTE — Telephone Encounter (Signed)
Per [facility] [phone] PT insurance will expire on 03-02-2023. Pt need to have procedure before 03-02-2023. I call pt to make her aware unable to leave message mail box isn't setup to be able to leave messages.

## 2023-01-21 NOTE — Telephone Encounter (Signed)
Called patient to make sure that her situation was fixed and she stated that she had contacted her insurance and that she will continue to have coverage on the day of her procedure so to leave things as they are. Lauro Franklin is aware.

## 2023-01-23 NOTE — Patient Instructions (Signed)
Prediabetes Eating Plan Prediabetes is a condition that causes blood sugar (glucose) levels to be higher than normal. This increases the risk for developing type 2 diabetes (type 2 diabetes mellitus). Working with a health care provider or nutrition specialist (dietitian) to make diet and lifestyle changes can help prevent the onset of diabetes. These changes may help you: Control your blood glucose levels. Improve your cholesterol levels. Manage your blood pressure. What are tips for following this plan? Reading food labels Read food labels to check the amount of fat, salt (sodium), and sugar in prepackaged foods. Avoid foods that have: Saturated fats. Trans fats. Added sugars. Avoid foods that have more than 300 milligrams (mg) of sodium per serving. Limit your sodium intake to less than 2,300 mg each day. Shopping Avoid buying pre-made and processed foods. Avoid buying drinks with added sugar. Cooking Cook with olive oil. Do not use butter, lard, or ghee. Bake, broil, grill, steam, or boil foods. Avoid frying. Meal planning  Work with your dietitian to create an eating plan that is right for you. This may include tracking how many calories you take in each day. Use a food diary, notebook, or mobile application to track what you eat at each meal. Consider following a Mediterranean diet. This includes: Eating several servings of fresh fruits and vegetables each day. Eating fish at least twice a week. Eating one serving each day of whole grains, beans, nuts, and seeds. Using olive oil instead of other fats. Limiting alcohol. Limiting red meat. Using nonfat or low-fat dairy products. Consider following a plant-based diet. This includes dietary choices that focus on eating mostly vegetables and fruit, grains, beans, nuts, and seeds. If you have high blood pressure, you may need to limit your sodium intake or follow a diet such as the DASH (Dietary Approaches to Stop Hypertension) eating  plan. The DASH diet aims to lower high blood pressure. Lifestyle Set weight loss goals with help from your health care team. It is recommended that most people with prediabetes lose 7% of their body weight. Exercise for at least 30 minutes 5 or more days a week. Attend a support group or seek support from a mental health counselor. Take over-the-counter and prescription medicines only as told by your health care provider. What foods are recommended? Fruits Berries. Bananas. Apples. Oranges. Grapes. Papaya. Mango. Pomegranate. Kiwi. Grapefruit. Cherries. Vegetables Lettuce. Spinach. Peas. Beets. Cauliflower. Cabbage. Broccoli. Carrots. Tomatoes. Squash. Eggplant. Herbs. Peppers. Onions. Cucumbers. Brussels sprouts. Grains Whole grains, such as whole-wheat or whole-grain breads, crackers, cereals, and pasta. Unsweetened oatmeal. Bulgur. Barley. Quinoa. Brown rice. Corn or whole-wheat flour tortillas or taco shells. Meats and other proteins Seafood. Poultry without skin. Lean cuts of pork and beef. Tofu. Eggs. Nuts. Beans. Dairy Low-fat or fat-free dairy products, such as yogurt, cottage cheese, and cheese. Beverages Water. Tea. Coffee. Sugar-free or diet soda. Seltzer water. Low-fat or nonfat milk. Milk alternatives, such as soy or almond milk. Fats and oils Olive oil. Canola oil. Sunflower oil. Grapeseed oil. Avocado. Walnuts. Sweets and desserts Sugar-free or low-fat pudding. Sugar-free or low-fat ice cream and other frozen treats. Seasonings and condiments Herbs. Sodium-free spices. Mustard. Relish. Low-salt, low-sugar ketchup. Low-salt, low-sugar barbecue sauce. Low-fat or fat-free mayonnaise. The items listed above may not be a complete list of recommended foods and beverages. Contact a dietitian for more information. What foods are not recommended? Fruits Fruits canned with syrup. Vegetables Canned vegetables. Frozen vegetables with butter or cream sauce. Grains Refined white  flour and flour   products, such as bread, pasta, snack foods, and cereals. Meats and other proteins Fatty cuts of meat. Poultry with skin. Breaded or fried meat. Processed meats. Dairy Full-fat yogurt, cheese, or milk. Beverages Sweetened drinks, such as iced tea and soda. Fats and oils Butter. Lard. Ghee. Sweets and desserts Baked goods, such as cake, cupcakes, pastries, cookies, and cheesecake. Seasonings and condiments Spice mixes with added salt. Ketchup. Barbecue sauce. Mayonnaise. The items listed above may not be a complete list of foods and beverages that are not recommended. Contact a dietitian for more information. Where to find more information American Diabetes Association: www.diabetes.org Summary You may need to make diet and lifestyle changes to help prevent the onset of diabetes. These changes can help you control blood sugar, improve cholesterol levels, and manage blood pressure. Set weight loss goals with help from your health care team. It is recommended that most people with prediabetes lose 7% of their body weight. Consider following a Mediterranean diet. This includes eating plenty of fresh fruits and vegetables, whole grains, beans, nuts, seeds, fish, and low-fat dairy, and using olive oil instead of other fats. This information is not intended to replace advice given to you by your health care provider. Make sure you discuss any questions you have with your health care provider. Document Revised: 10/18/2019 Document Reviewed: 10/18/2019 Elsevier Patient Education  2024 [employer].  

## 2023-01-24 NOTE — Telephone Encounter (Signed)
Spoke with pt on 01-24-2023 per pt will call [facility] and her social worker to get insurance renewed. Pt will call me once insurance is renewed.   Pt wants to get Procedure scheduled in July instead of Aug due to insurance.

## 2023-01-25 ENCOUNTER — Ambulatory Visit (INDEPENDENT_AMBULATORY_CARE_PROVIDER_SITE_OTHER): Payer: Medicaid Other | Admitting: Nurse Practitioner

## 2023-01-25 ENCOUNTER — Encounter: Payer: Self-pay | Admitting: Nurse Practitioner

## 2023-01-25 VITALS — BP 116/82 | HR 80 | Temp 97.9°F | Ht 69.02 in | Wt 308.4 lb

## 2023-01-25 DIAGNOSIS — N760 Acute vaginitis: Secondary | ICD-10-CM | POA: Diagnosis not present

## 2023-01-25 DIAGNOSIS — R7303 Prediabetes: Secondary | ICD-10-CM | POA: Diagnosis not present

## 2023-01-25 DIAGNOSIS — F321 Major depressive disorder, single episode, moderate: Secondary | ICD-10-CM | POA: Diagnosis not present

## 2023-01-25 DIAGNOSIS — B9689 Other specified bacterial agents as the cause of diseases classified elsewhere: Secondary | ICD-10-CM

## 2023-01-25 DIAGNOSIS — E559 Vitamin D deficiency, unspecified: Secondary | ICD-10-CM | POA: Diagnosis not present

## 2023-01-25 DIAGNOSIS — E7800 Pure hypercholesterolemia, unspecified: Secondary | ICD-10-CM | POA: Diagnosis not present

## 2023-01-25 DIAGNOSIS — Z6841 Body Mass Index (BMI) 40.0 and over, adult: Secondary | ICD-10-CM

## 2023-01-25 DIAGNOSIS — G4733 Obstructive sleep apnea (adult) (pediatric): Secondary | ICD-10-CM

## 2023-01-25 DIAGNOSIS — G894 Chronic pain syndrome: Secondary | ICD-10-CM | POA: Diagnosis not present

## 2023-01-25 DIAGNOSIS — I10 Essential (primary) hypertension: Secondary | ICD-10-CM

## 2023-01-25 DIAGNOSIS — M0579 Rheumatoid arthritis with rheumatoid factor of multiple sites without organ or systems involvement: Secondary | ICD-10-CM

## 2023-01-25 DIAGNOSIS — K2100 Gastro-esophageal reflux disease with esophagitis, without bleeding: Secondary | ICD-10-CM | POA: Diagnosis not present

## 2023-01-25 DIAGNOSIS — D3501 Benign neoplasm of right adrenal gland: Secondary | ICD-10-CM

## 2023-01-25 LAB — URINALYSIS, ROUTINE W REFLEX MICROSCOPIC
Bilirubin, UA: NEGATIVE
Glucose, UA: NEGATIVE
Nitrite, UA: NEGATIVE
RBC, UA: NEGATIVE
Specific Gravity, UA: >1.03 — ABNORMAL HIGH (ref 1.005–1.030)
Urobilinogen, Ur: 0.2 mg/dL (ref 0.2–1.0)
pH, UA: 5.5 (ref 5.0–7.5)

## 2023-01-25 LAB — WET PREP FOR TRICH, YEAST, CLUE
Clue Cell Exam: NEGATIVE
Trichomonas Exam: NEGATIVE
Yeast Exam: NEGATIVE

## 2023-01-25 LAB — MICROSCOPIC EXAMINATION: Bacteria, UA: NONE SEEN

## 2023-01-25 LAB — BAYER DCA HB A1C WAIVED: HB A1C (BAYER DCA - WAIVED): 6.3 % — ABNORMAL HIGH (ref 4.8–5.6)

## 2023-01-25 NOTE — Assessment & Plan Note (Signed)
Ongoing.  Continue supplement as ordered.  Recheck today.

## 2023-01-25 NOTE — Assessment & Plan Note (Signed)
A1c 6.3% today, slight trend up.  Continue diet monitoring and initiate medication as needed if A1c 6.5% or greater.

## 2023-01-25 NOTE — Assessment & Plan Note (Signed)
Chronic, stable without medication.  Recommend she monitor BP at least a few mornings a week at home and document.  DASH diet at home.  Continue current diet focus.  Labs today: CMP.

## 2023-01-25 NOTE — Assessment & Plan Note (Signed)
Chronic, improved at this time.  Continue collaboration with GI.  She has noticed improvement with Voquenza treatment and would like to continue this.  Have reached out to Dr. Tobi Bastos to alert them.

## 2023-01-25 NOTE — Assessment & Plan Note (Signed)
Chronic, ongoing -- noted on labs, ASCVD 5.1% and last LDL <190.  Continue diet focus at home. Lipid panel today.

## 2023-01-25 NOTE — Progress Notes (Signed)
BP 116/82   Pulse 80   Temp 97.9 F (36.6 C) (Oral)   Ht 5' 9.02" (1.753 m)   Wt (!) 308 lb 6.4 oz (139.9 kg)   SpO2 96%   BMI 45.52 kg/m    Subjective:    Patient ID: Erin Contreras, female    DOB: 07/26/1961, 62 y.o.   MRN: [id number]  HPI: Erin Contreras is a 62 y.o. female  Chief Complaint  Patient presents with   Depression   Hyperlipidemia   Hypertension   Rheumatoid Arthritis   RHEUMATOID ARTHRITIS AND CHRONIC PAIN Currently at [facility] with rheumatology, last visit 11/19/22.  She is getting Orencia infusions, last on 12/28/22. Has tried Methotrexate and Rinvoq in past, with Rinvoq causing major headaches.  They have restart Methotrexate weekly + Plaquenil.  Taking Meloxicam for acute pain. Pain is improving, except for left arm some discomfort.  Is taking Vitamin D supplement weekly, not taking B12 at this time.    Followed by chronic pain management at [facility], last 10/14/22. Is getting steroid injections to back.  She obtains Lyrica from pain management. Pain control status: stable Duration: years Location: arms and elbows and wrists Quality: dull, aching, and throbbing Current Pain Level: 7/10 left arm Previous Pain Level: 8/10 Breakthrough pain: no Benefit from narcotic medications: unknown What Activities task can be accomplished with current medication? Occasional ADLs Interested in weaning off narcotics:no   Stool softners/OTC fiber: no  Previous pain specialty evaluation: yes Non-narcotic analgesic meds: yes Narcotic contract: yes   HYPERTENSION / HYPERLIPIDEMIA Currently on no medications for these.  Has elevation of A1c on past labs, in March was 6.2%. Saw cardiology on 04/30/22.  Had echo on 06/09/22 and this was reassuring with EF 60-65%.   Satisfied with current treatment? yes Duration of hypertension: chronic Duration of hyperlipidemia: chronic Aspirin: no Recent stressors: no Recurrent headaches: no Visual changes: no Palpitations: no Dyspnea:  no Chest pain: no Lower extremity edema: no Dizzy/lightheaded: no  The 10-year ASCVD risk score (Arnett DK, et al., 2019) is: 5.1%   Values used to calculate the score:     Age: 58 years     Sex: Female     Is Non-Hispanic African American: Yes     Diabetic: No     Tobacco smoker: No     Systolic Blood Pressure: 116 mmHg     Is BP treated: No     HDL Cholesterol: 65 mg/dL     Total Cholesterol: 207 mg/dL  GERD Continues Protonix 40 MG BID, saw GI on 01/18/23 -- she was given Voquenza, they recommended she stop Protonix and start this.  She reports this has much improved heart burn.   GERD control status: controlled Satisfied with current treatment? yes Heartburn frequency: none Medication side effects: no  Medication compliance: stable Dysphagia: no Odynophagia:  no Hematemesis: no Blood in stool: no EGD: no   DEPRESSION Continues on Duloxetine and Lyrica.  Continues on twice weekly Flagyl cream vaginally for recurrent BV infections.  To stop this in September. Mood status: stable Satisfied with current treatment?: yes Symptom severity: moderate  Duration of current treatment : chronic Side effects: no Medication compliance: good compliance Psychotherapy/counseling: none Depressed mood: yes Anxious mood: no Anhedonia: no Significant weight loss or gain: no Insomnia: yes hard to fall asleep Fatigue: sometimes Feelings of worthlessness or guilt: no Impaired concentration/indecisiveness: no Suicidal ideations: no Hopelessness: no Crying spells: no    01/25/2023   11:12 AM 09/16/2022  11:02 AM 08/05/2022   10:15 AM 07/22/2022    9:09 AM 04/09/2022    1:42 PM  Depression screen PHQ 2/9  Decreased Interest 0 0 0 0 0  Down, Depressed, Hopeless 0 0 0 0 0  PHQ - 2 Score 0 0 0 0 0  Altered sleeping 1 0 0  3  Tired, decreased energy 0 3 3  3   Change in appetite 0 0 0  0  Feeling bad or failure about yourself  0 0 0  0  Trouble concentrating 0 0 0  0  Moving slowly or  fidgety/restless 0 0 0  2  Suicidal thoughts 0 0 0  0  PHQ-9 Score 1 3 3  8   Difficult doing work/chores Not difficult at all  Not difficult at all  Not difficult at all       01/25/2023   11:12 AM 09/16/2022   11:02 AM 08/05/2022   10:16 AM 04/09/2022    1:42 PM  GAD 7 : Generalized Anxiety Score  Nervous, Anxious, on Edge 0 0 0 0  Control/stop worrying 1 0 0 0  Worry too much - different things 1 0 0 0  Trouble relaxing 0 0 0 0  Restless 0 0 0 0  Easily annoyed or irritable 0 0 0 0  Afraid - awful might happen 0 0 0 0  Total GAD 7 Score 2 0 0 0  Anxiety Difficulty Not difficult at all  Not difficult at all Not difficult at all    Relevant past medical, surgical, family and social history reviewed and updated as indicated. Interim medical history since our last visit reviewed. Allergies and medications reviewed and updated.  Review of Systems  Constitutional:  Negative for activity change, appetite change, diaphoresis, fatigue and unexpected weight change.  Respiratory: Negative.    Cardiovascular: Negative.   Gastrointestinal: Negative.   Genitourinary:  Negative for decreased urine volume, dysuria, frequency, hematuria, pelvic pain, urgency and vaginal discharge.  Musculoskeletal:  Positive for arthralgias.  Neurological: Negative.   Psychiatric/Behavioral: Negative.      Per HPI unless specifically indicated above     Objective:    BP 116/82   Pulse 80   Temp 97.9 F (36.6 C) (Oral)   Ht 5' 9.02" (1.753 m)   Wt (!) 308 lb 6.4 oz (139.9 kg)   SpO2 96%   BMI 45.52 kg/m   Wt Readings from Last 3 Encounters:  01/25/23 (!) 308 lb 6.4 oz (139.9 kg)  01/18/23 (!) 312 lb 3.2 oz (141.6 kg)  10/14/22 (!) 311 lb (141.1 kg)    Physical Exam Vitals and nursing note reviewed.  Constitutional:      General: She is awake. She is not in acute distress.    Appearance: She is well-developed and well-groomed. She is obese. She is not ill-appearing.  HENT:     Head:  Normocephalic.     Right Ear: Hearing normal.     Left Ear: Hearing normal.  Eyes:     General: Lids are normal.        Right eye: No discharge.        Left eye: No discharge.     Conjunctiva/sclera: Conjunctivae normal.     Pupils: Pupils are equal, round, and reactive to light.  Neck:     Vascular: No carotid bruit.  Cardiovascular:     Rate and Rhythm: Normal rate and regular rhythm.     Heart sounds: Normal heart sounds. No murmur  heard.    No gallop.  Pulmonary:     Effort: Pulmonary effort is normal. No accessory muscle usage or respiratory distress.     Breath sounds: Normal breath sounds.  Abdominal:     General: Bowel sounds are normal. There is no distension.     Palpations: Abdomen is soft.     Tenderness: There is no abdominal tenderness.  Musculoskeletal:     Cervical back: Normal range of motion and neck supple.     Right lower leg: No edema.     Left lower leg: No edema.  Skin:    General: Skin is warm and dry.  Neurological:     Mental Status: She is alert and oriented to person, place, and time.  Psychiatric:        Attention and Perception: Attention normal.        Mood and Affect: Mood normal.        Speech: Speech normal.        Behavior: Behavior normal. Behavior is cooperative.        Thought Content: Thought content normal.    Results for orders placed or performed in visit on 10/08/22  WET PREP FOR TRICH, YEAST, CLUE   Specimen: Vaginal Swab   Vaginal Swab  Result Value Ref Range   Trichomonas Exam Negative Negative   Yeast Exam Negative Negative   Clue Cell Exam Positive (A) Negative  Microscopic Examination   Urine  Result Value Ref Range   WBC, UA 0-5 0 - 5 /hpf   RBC, Urine 0-2 0 - 2 /hpf   Epithelial Cells (non renal) 0-10 0 - 10 /hpf   Bacteria, UA Few None seen/Few  Bayer DCA Hb A1c Waived  Result Value Ref Range   HB A1C (BAYER DCA - WAIVED) 6.2 (H) 4.8 - 5.6 %  Urinalysis, Routine w reflex microscopic  Result Value Ref Range    Specific Gravity, UA 1.030 1.005 - 1.030   pH, UA 5.0 5.0 - 7.5   Color, UA Yellow Yellow   Appearance Ur Hazy (A) Clear   Leukocytes,UA Trace (A) Negative   Protein,UA Trace Negative/Trace   Glucose, UA Negative Negative   Ketones, UA Negative Negative   RBC, UA Negative Negative   Bilirubin, UA Negative Negative   Urobilinogen, Ur 0.2 0.2 - 1.0 mg/dL   Nitrite, UA Negative Negative   Microscopic Examination See below:   Comprehensive metabolic panel  Result Value Ref Range   Glucose 77 70 - 99 mg/dL   BUN 19 8 - 27 mg/dL   Creatinine, Ser 1.61 0.57 - 1.00 mg/dL   eGFR 68 >09 UE/AVW/0.98   BUN/Creatinine Ratio 20 12 - 28   Sodium 140 134 - 144 mmol/L   Potassium 4.1 3.5 - 5.2 mmol/L   Chloride 102 96 - 106 mmol/L   CO2 21 20 - 29 mmol/L   Calcium 9.3 8.7 - 10.3 mg/dL   Total Protein 7.1 6.0 - 8.5 g/dL   Albumin 4.0 3.9 - 4.9 g/dL   Globulin, Total 3.1 1.5 - 4.5 g/dL   Albumin/Globulin Ratio 1.3 1.2 - 2.2   Bilirubin Total 0.3 0.0 - 1.2 mg/dL   Alkaline Phosphatase 125 (H) 44 - 121 IU/L   AST 18 0 - 40 IU/L   ALT 20 0 - 32 IU/L  Lipid Panel w/o Chol/HDL Ratio  Result Value Ref Range   Cholesterol, Total 207 (H) 100 - 199 mg/dL   Triglycerides 119 (H) 0 - 149  mg/dL   HDL 65 >16 mg/dL   VLDL Cholesterol Cal 26 5 - 40 mg/dL   LDL Chol Calc (NIH) 109 (H) 0 - 99 mg/dL  Magnesium  Result Value Ref Range   Magnesium 2.0 1.6 - 2.3 mg/dL  TSH  Result Value Ref Range   TSH 0.613 0.450 - 4.500 uIU/mL  VITAMIN D 25 Hydroxy (Vit-D Deficiency, Fractures)  Result Value Ref Range   Vit D, 25-Hydroxy 21.1 (L) 30.0 - 100.0 ng/mL  Vitamin B12  Result Value Ref Range   Vitamin B-12 489 232 - 1,245 pg/mL  T4, free  Result Value Ref Range   Free T4 1.04 0.82 - 1.77 ng/dL      Assessment & Plan:   Problem List Items Addressed This Visit       Cardiovascular and Mediastinum   Benign hypertension (Chronic)    Chronic, stable without medication.  Recommend she monitor BP at  least a few mornings a week at home and document.  DASH diet at home.  Continue current diet focus.  Labs today: CMP.      Relevant Orders   Comprehensive metabolic panel     Digestive   Gastroesophageal reflux disease with esophagitis (Chronic)    Chronic, improved at this time.  Continue collaboration with GI.  She has noticed improvement with Voquenza treatment and would like to continue this.  Have reached out to Dr. Tobi Bastos to alert them.          Musculoskeletal and Integument   Rheumatoid arthritis involving multiple sites with positive rheumatoid factor (HCC) (Chronic)    Chronic, ongoing, followed by rheumatology and pain clinic.  Continue these collaborations, recent notes reviewed.  Labs today: CMP.        Genitourinary   Bacterial vaginosis    Improving today with wet prep negative.  Will continue Flagyl treatment until September and plan on recheck next visit.  Overall she reports improved symptoms.      Relevant Orders   WET PREP FOR TRICH, YEAST, CLUE   Urinalysis, Routine w reflex microscopic     Other   Prediabetes (Chronic)    A1c 6.3% today, slight trend up.  Continue diet monitoring and initiate medication as needed if A1c 6.5% or greater.      Relevant Orders   Bayer DCA Hb A1c Waived   Chronic pain syndrome    Chronic, ongoing, followed by pain management.  Continue this collaboration and current treatment regimen as ordered by them.  Recent notes reviewed.        Depression, major, single episode, moderate (HCC) - Primary    Ongoing, continue Duloxetine which benefits both mood and pain.  Denies SI/HI.  Discussed addition of therapy or Wellbutrin, at this time wishes to continue current regimen and will adjust as needed.        Elevated low density lipoprotein (LDL) cholesterol level    Chronic, ongoing -- noted on labs, ASCVD 5.1% and last LDL <190.  Continue diet focus at home. Lipid panel today.      Relevant Orders   Lipid Panel w/o Chol/HDL  Ratio   Comprehensive metabolic panel   Obesity    BMI 45.52.  Recommended eating smaller high protein, low fat meals more frequently and exercising 30 mins a day 5 times a week with a goal of 10-15lb weight loss in the next 3 months. Patient voiced their understanding and motivation to adhere to these recommendations.       Vitamin D  deficiency    Ongoing.  Continue supplement as ordered.  Recheck today.      Relevant Orders   VITAMIN D 25 Hydroxy (Vit-D Deficiency, Fractures)     Follow up plan: Return in about 6 months (around 07/27/2023) for HTN/HLD, GERD, RA, MOOD, CHRONIC PAIN.

## 2023-01-25 NOTE — Assessment & Plan Note (Signed)
BMI 45.52.  Recommended eating smaller high protein, low fat meals more frequently and exercising 30 mins a day 5 times a week with a goal of 10-15lb weight loss in the next 3 months. Patient voiced their understanding and motivation to adhere to these recommendations.

## 2023-01-25 NOTE — Assessment & Plan Note (Signed)
Ongoing, continue Duloxetine which benefits both mood and pain.  Denies SI/HI.  Discussed addition of therapy or Wellbutrin, at this time wishes to continue current regimen and will adjust as needed.   

## 2023-01-25 NOTE — Assessment & Plan Note (Signed)
Chronic, ongoing, followed by pain management.  Continue this collaboration and current treatment regimen as ordered by them.  Recent notes reviewed.   

## 2023-01-25 NOTE — Assessment & Plan Note (Signed)
Improving today with wet prep negative.  Will continue Flagyl treatment until September and plan on recheck next visit.  Overall she reports improved symptoms.

## 2023-01-25 NOTE — Assessment & Plan Note (Signed)
Chronic, ongoing, followed by rheumatology and pain clinic.  Continue these collaborations, recent notes reviewed.  Labs today: CMP.

## 2023-01-26 ENCOUNTER — Telehealth: Payer: Self-pay

## 2023-01-26 LAB — COMPREHENSIVE METABOLIC PANEL
ALT: 22 IU/L (ref 0–32)
AST: 21 IU/L (ref 0–40)
Albumin: 3.9 g/dL (ref 3.9–4.9)
Alkaline Phosphatase: 121 IU/L (ref 44–121)
BUN/Creatinine Ratio: 20 (ref 12–28)
BUN: 15 mg/dL (ref 8–27)
Bilirubin Total: 0.3 mg/dL (ref 0.0–1.2)
CO2: 23 mmol/L (ref 20–29)
Calcium: 9.3 mg/dL (ref 8.7–10.3)
Chloride: 105 mmol/L (ref 96–106)
Creatinine, Ser: 0.75 mg/dL (ref 0.57–1.00)
Globulin, Total: 2.8 g/dL (ref 1.5–4.5)
Glucose: 79 mg/dL (ref 70–99)
Potassium: 4.3 mmol/L (ref 3.5–5.2)
Sodium: 141 mmol/L (ref 134–144)
Total Protein: 6.7 g/dL (ref 6.0–8.5)
eGFR: 89 mL/min/{1.73_m2} (ref >59)

## 2023-01-26 LAB — LIPID PANEL W/O CHOL/HDL RATIO
Cholesterol, Total: 184 mg/dL (ref 100–199)
HDL: 61 mg/dL (ref >39)
LDL Chol Calc (NIH): 101 mg/dL — ABNORMAL HIGH (ref 0–99)
Triglycerides: 125 mg/dL (ref 0–149)
VLDL Cholesterol Cal: 22 mg/dL (ref 5–40)

## 2023-01-26 LAB — VITAMIN D 25 HYDROXY (VIT D DEFICIENCY, FRACTURES): Vit D, 25-Hydroxy: 44.7 ng/mL (ref 30.0–100.0)

## 2023-01-26 MED ORDER — VOQUEZNA 10 MG PO TABS: 1.0000 | ORAL_TABLET | Freq: Every day | ORAL | 6 refills | Status: DC

## 2023-01-26 NOTE — Progress Notes (Signed)
Contacted via MyChart -- please call though as she does not consistently check MyChart  Good morning Erin Contreras, your labs have returned and continue to be stable.  LDL, bad cholesterol, remains a little elevated.  No medications for this needed at this time, but do recommend heavy focus on regular exercise and healthy diet changes.  Any questions? Keep being amazing!!  Thank you for allowing me to participate in your care.  I appreciate you. Kindest regards, Keysi Oelkers

## 2023-01-26 NOTE — Telephone Encounter (Signed)
Voquezna 10 MG prescription was sent per Dr. Johnney Killian message.

## 2023-01-26 NOTE — Telephone Encounter (Signed)
-----   Message from Wyline Mood, MD sent at 01/26/2023  9:54 AM EDT ----- Regarding: FW: Jermani Eberlein please go ahead and prescribe 10 mg dose daily ----- Message ----- From: Marjie Skiff, NP Sent: 01/25/2023  12:00 PM EDT To: Wyline Mood, MD  Good morning Dr. Tobi Bastos.  This patient was recently seen by you and provided Voquenza samples.  She said these work great and wanted me to let you know, as you wanted her to reach out.  Can you send this in for her if covered:)

## 2023-01-27 NOTE — Telephone Encounter (Signed)
Patient called to reschedule her procedure to an earlier date since her insurance will ran out after 03/02/2023. Therefore, I added her for 03/02/2023 with Dr. Tobi Bastos since he didn't have anything else before. Patient agreed and I then spoke to Ava from the endoscopy unit and let her know of the changes.

## 2023-01-27 NOTE — Telephone Encounter (Signed)
Please read patient's phone note from 01/26/2023 to follow up this conversation.

## 2023-01-28 DIAGNOSIS — R0600 Dyspnea, unspecified: Secondary | ICD-10-CM | POA: Diagnosis not present

## 2023-01-28 DIAGNOSIS — M0579 Rheumatoid arthritis with rheumatoid factor of multiple sites without organ or systems involvement: Secondary | ICD-10-CM | POA: Diagnosis not present

## 2023-01-28 DIAGNOSIS — R0602 Shortness of breath: Secondary | ICD-10-CM | POA: Diagnosis not present

## 2023-01-31 NOTE — Telephone Encounter (Signed)
I called patient insurance [employer] to appeal for their response to their decision of denying patient's medication-Voquezna. Therefore, they stated that I had to give them 72 hours before we are able to hear back from them with a decision.

## 2023-02-01 NOTE — Telephone Encounter (Signed)
Called patient to let her know that the endoscopy unit called me to let me know that there was a slot open for 02/07/2023 and I then called patient to ask her if she wanted to take that slot and she stated that she could. Therefore, I called Trish from the endo unit to let her know that the patient agreed on switching. Now I will notify Lauro Franklin.

## 2023-02-07 ENCOUNTER — Ambulatory Visit: Payer: Medicaid Other | Admitting: Anesthesiology

## 2023-02-07 ENCOUNTER — Ambulatory Visit
Admission: RE | Admit: 2023-02-07 | Discharge: 2023-02-07 | Disposition: A | Discharge status: Home / Self Care | Payer: Medicaid Other | Attending: Gastroenterology | Admitting: Gastroenterology

## 2023-02-07 ENCOUNTER — Encounter
Admission: RE | Disposition: A | Discharge status: Home / Self Care | Payer: Self-pay | Source: Home / Self Care | Attending: Gastroenterology

## 2023-02-07 DIAGNOSIS — Z79899 Other long term (current) drug therapy: Secondary | ICD-10-CM | POA: Insufficient documentation

## 2023-02-07 DIAGNOSIS — I10 Essential (primary) hypertension: Secondary | ICD-10-CM | POA: Diagnosis not present

## 2023-02-07 DIAGNOSIS — K2100 Gastro-esophageal reflux disease with esophagitis, without bleeding: Secondary | ICD-10-CM | POA: Diagnosis not present

## 2023-02-07 DIAGNOSIS — E669 Obesity, unspecified: Secondary | ICD-10-CM | POA: Diagnosis not present

## 2023-02-07 DIAGNOSIS — N3281 Overactive bladder: Secondary | ICD-10-CM | POA: Insufficient documentation

## 2023-02-07 DIAGNOSIS — K449 Diaphragmatic hernia without obstruction or gangrene: Secondary | ICD-10-CM | POA: Insufficient documentation

## 2023-02-07 DIAGNOSIS — G4730 Sleep apnea, unspecified: Secondary | ICD-10-CM | POA: Diagnosis not present

## 2023-02-07 DIAGNOSIS — R1319 Other dysphagia: Secondary | ICD-10-CM

## 2023-02-07 DIAGNOSIS — R12 Heartburn: Secondary | ICD-10-CM

## 2023-02-07 DIAGNOSIS — M069 Rheumatoid arthritis, unspecified: Secondary | ICD-10-CM | POA: Insufficient documentation

## 2023-02-07 DIAGNOSIS — Z6841 Body Mass Index (BMI) 40.0 and over, adult: Secondary | ICD-10-CM | POA: Insufficient documentation

## 2023-02-07 DIAGNOSIS — K209 Esophagitis, unspecified without bleeding: Secondary | ICD-10-CM | POA: Diagnosis not present

## 2023-02-07 DIAGNOSIS — R1310 Dysphagia, unspecified: Secondary | ICD-10-CM | POA: Insufficient documentation

## 2023-02-07 DIAGNOSIS — K222 Esophageal obstruction: Secondary | ICD-10-CM | POA: Diagnosis not present

## 2023-02-07 HISTORY — PX: ESOPHAGOGASTRODUODENOSCOPY (EGD) WITH PROPOFOL: SHX5813

## 2023-02-07 SURGERY — ESOPHAGOGASTRODUODENOSCOPY (EGD) WITH PROPOFOL
Anesthesia: General

## 2023-02-07 MED ORDER — DEXMEDETOMIDINE HCL IN NACL 80 MCG/20ML IV SOLN
INTRAVENOUS | Status: DC | PRN
  Administered 2023-02-07: 20 ug via INTRAVENOUS

## 2023-02-07 MED ORDER — PROPOFOL 10 MG/ML IV BOLUS
INTRAVENOUS | Status: DC | PRN
  Administered 2023-02-07: 80 mg via INTRAVENOUS

## 2023-02-07 MED ORDER — PROPOFOL 500 MG/50ML IV EMUL
INTRAVENOUS | Status: DC | PRN
  Administered 2023-02-07: 125 ug/kg/min via INTRAVENOUS

## 2023-02-07 MED ORDER — LIDOCAINE HCL (CARDIAC) PF 100 MG/5ML IV SOSY
PREFILLED_SYRINGE | INTRAVENOUS | Status: DC | PRN
  Administered 2023-02-07: 50 mg via INTRAVENOUS

## 2023-02-07 MED ORDER — SODIUM CHLORIDE 0.9 % IV SOLN
INTRAVENOUS | Status: DC
  Administered 2023-02-07: 20 mL/h via INTRAVENOUS

## 2023-02-07 NOTE — Anesthesia Preprocedure Evaluation (Signed)
Anesthesia Evaluation  Patient identified by MRN, date of birth, ID band Patient awake    Reviewed: Allergy & Precautions, H&P , NPO status , Patient's Chart, lab work & pertinent test results, reviewed documented beta blocker date and time   History of Anesthesia Complications (+) PONV and history of anesthetic complications  Airway Mallampati: III   Neck ROM: full    Dental  (+) Poor Dentition   Pulmonary sleep apnea and Continuous Positive Airway Pressure Ventilation    Pulmonary exam normal        Cardiovascular Exercise Tolerance: Poor hypertension, Normal cardiovascular exam Rhythm:regular Rate:Normal     Neuro/Psych  PSYCHIATRIC DISORDERS  Depression     Neuromuscular disease    GI/Hepatic Neg liver ROS, hiatal hernia,GERD  ,,  Endo/Other  negative endocrine ROS    Renal/GU negative Renal ROS  negative genitourinary   Musculoskeletal   Abdominal   Peds  Hematology negative hematology ROS (+)   Anesthesia Other Findings Past Medical History: No date: Boils No date: Ear mass No date: GERD (gastroesophageal reflux disease) No date: Hemorrhoids No date: Herpes simplex virus infection No date: HSV infection No date: Lumbago with sciatica No date: OA (osteoarthritis) of knee No date: Obesity No date: Overactive bladder No date: PONV (postoperative nausea and vomiting) No date: Pre-diabetes No date: RA (rheumatoid arthritis) (HCC) No date: Sleep apnea     Comment:  does not use cpap-had bariatric surgery and has no               issues since surgery No date: Urge and stress incontinence Past Surgical History: 2011: CHOLECYSTECTOMY 03/19/2021: COLONOSCOPY; N/A     Comment:  Procedure: COLONOSCOPY;  Surgeon: Wyline Mood, MD;                Location: [facility] ENDOSCOPY;  Service: Gastroenterology;                Laterality: N/A; No date: FRACTURE SURGERY; Right     Comment:  ankle as a child 01/28/2021:  JOINT REPLACEMENT; Left 07/11/2017: KNEE ARTHROPLASTY; Right     Comment:  Procedure: COMPUTER ASSISTED TOTAL KNEE ARTHROPLASTY;                Surgeon: Donato Heinz, MD;  Location: [facility] ORS;                Service: Orthopedics;  Laterality: Right; 01/28/2021: KNEE ARTHROPLASTY; Left     Comment:  Procedure: COMPUTER ASSISTED TOTAL KNEE ARTHROPLASTY;                Surgeon: Donato Heinz, MD;  Location: [facility] ORS;                Service: Orthopedics;  Laterality: Left; No date: SLEEVE GASTROPLASTY 2010: TUMOR REMOVAL; Right     Comment:  ear BMI    Body Mass Index: 46.98 kg/m     Reproductive/Obstetrics negative OB ROS                             Anesthesia Physical Anesthesia Plan  ASA: 3  Anesthesia Plan: General   Post-op Pain Management:    Induction:   PONV Risk Score and Plan:   Airway Management Planned:   Additional Equipment:   Intra-op Plan:   Post-operative Plan:   Informed Consent: I have reviewed the patients History and Physical, chart, labs and discussed the procedure including the risks, benefits and alternatives  for the proposed anesthesia with the patient or authorized representative who has indicated his/her understanding and acceptance.     Dental Advisory Given  Plan Discussed with: CRNA  Anesthesia Plan Comments:        Anesthesia Quick Evaluation

## 2023-02-07 NOTE — Transfer of Care (Signed)
Immediate Anesthesia Transfer of Care Note  Patient: Erin Contreras  Procedure(s) Performed: ESOPHAGOGASTRODUODENOSCOPY (EGD) WITH PROPOFOL  Patient Location: PACU  Anesthesia Type:General  Level of Consciousness: drowsy and responds to stimulation  Airway & Oxygen Therapy: Patient Spontanous Breathing  Post-op Assessment: Report given to RN and Post -op Vital signs reviewed and stable  Post vital signs: Reviewed and stable  Last Vitals:  Vitals Value Taken Time  BP    Temp    Pulse 66 02/07/23 1150  Resp 24 02/07/23 1150  SpO2 98 % 02/07/23 1150  Vitals shown include unvalidated device data.  Last Pain:  Vitals:   02/07/23 1044  TempSrc: Temporal  PainSc: 0-No pain         Complications: No notable events documented.

## 2023-02-07 NOTE — H&P (Signed)
Wyline Mood, MD [address], [place], [place], [address] [address], [place], [place], [address] Phone: [phone]  Fax: [phone]  Primary Care Physician:  Marjie Skiff, NP   Pre-Procedure History & Physical: HPI:  TAFFY HYPPOLITE is a 62 y.o. female is here for an endoscopy    Past Medical History:  Diagnosis Date   Boils    Ear mass    GERD (gastroesophageal reflux disease)    Hemorrhoids    Herpes simplex virus infection    HSV infection    Lumbago with sciatica    OA (osteoarthritis) of knee    Obesity    Overactive bladder    PONV (postoperative nausea and vomiting)    Pre-diabetes    RA (rheumatoid arthritis) (HCC)    Sleep apnea    does not use cpap-had bariatric surgery and has no issues since surgery   Urge and stress incontinence     Past Surgical History:  Procedure Laterality Date   CHOLECYSTECTOMY  2011   COLONOSCOPY N/A 03/19/2021   Procedure: COLONOSCOPY;  Surgeon: Wyline Mood, MD;  Location: [facility] ENDOSCOPY;  Service: Gastroenterology;  Laterality: N/A;   FRACTURE SURGERY Right    ankle as a child   JOINT REPLACEMENT Left 01/28/2021   KNEE ARTHROPLASTY Right 07/11/2017   Procedure: COMPUTER ASSISTED TOTAL KNEE ARTHROPLASTY;  Surgeon: Donato Heinz, MD;  Location: [facility] ORS;  Service: Orthopedics;  Laterality: Right;   KNEE ARTHROPLASTY Left 01/28/2021   Procedure: COMPUTER ASSISTED TOTAL KNEE ARTHROPLASTY;  Surgeon: Donato Heinz, MD;  Location: [facility] ORS;  Service: Orthopedics;  Laterality: Left;   SLEEVE GASTROPLASTY     TUMOR REMOVAL Right 2010   ear    Prior to Admission medications   Medication Sig Start Date End Date Taking? Authorizing Provider  acyclovir (ZOVIRAX) 400 MG tablet Take 1 tablet (400 mg total) by mouth 2 (two) times daily as needed. 04/09/22  Yes Cannady, Jolene T, NP  Cholecalciferol (VITAMIN D3) 1.25 MG (50000 UT) CAPS Take 1 capsule by mouth once a week. 01/01/23  Yes [provider]  Cholecalciferol 1.25 MG (50000 UT) TABS Take 1 tablet by mouth once a week. 10/11/22  Yes Cannady, Corrie Dandy T, NP  DULoxetine (CYMBALTA) 20 MG capsule Take 2 capsules (40 mg total) by mouth daily after breakfast. 10/14/22 04/12/23 Yes Edward Jolly, MD  folic acid (FOLVITE) 1 MG tablet Take 1 tablet by mouth daily. 11/25/22 11/25/23 Yes [provider]  hydroxychloroquine (PLAQUENIL) 200 MG tablet Take 200 mg by mouth 2 (two) times daily.   Yes [provider]  methotrexate (RHEUMATREX) 2.5 MG tablet Take 15 mg by mouth once a week.   Yes [provider]  metroNIDAZOLE (METROGEL) 0.75 % vaginal gel Place application twice a week into vaginal area for the next 6 months. 10/08/22  Yes Cannady, Jolene T, NP  pregabalin (LYRICA) 100 MG capsule Take 1 capsule (100 mg total) by mouth at bedtime as needed. 10/14/22 04/12/23 Yes Edward Jolly, MD  Vonoprazan Fumarate (VOQUEZNA) 10 MG TABS Take 1 tablet by mouth daily. 01/26/23  Yes Wyline Mood, MD    Allergies as of 01/20/2023   (No Known Allergies)    Family History  Problem Relation Age of Onset   Cancer Mother        liver   Diabetes Mother    Hyperlipidemia Mother    Hypertension Mother    Cirrhosis Father  liver   Hypertension Daughter    Stroke Maternal Grandmother    Stroke Maternal Grandfather    Heart disease Paternal Grandmother    Diabetes Paternal Grandmother    Heart disease Paternal Grandfather    Diabetes Paternal Grandfather     Social History   Socioeconomic History   Marital status: Single    Spouse name: Not on file   Number of children: Not on file   Years of education: Not on file   Highest education level: Not on file  Occupational History   Not on file  Tobacco Use   Smoking status: Never   Smokeless tobacco: Never  Vaping Use   Vaping Use: Never used  Substance and Sexual Activity   Alcohol use: No   Drug use: No   Sexual activity: Not Currently  Other Topics  Concern   Not on file  Social History Narrative   Not on file   Social Determinants of Health   Financial Resource Strain: Not on file  Food Insecurity: Not on file  Transportation Needs: Not on file  Physical Activity: Not on file  Stress: Not on file  Social Connections: Not on file  Intimate Partner Violence: Not on file    Review of Systems: See HPI, otherwise negative ROS  Physical Exam: BP (!) 144/95   Pulse 74   Temp (!) 96.7 F (35.9 C) (Temporal)   Resp 18   Ht 5\' 8"  (1.727 m)   Wt (!) 140.2 kg   SpO2 98%   BMI 46.98 kg/m  General:   Alert,  pleasant and cooperative in NAD Head:  Normocephalic and atraumatic. Neck:  Supple; no masses or thyromegaly. Lungs:  Clear throughout to auscultation, normal respiratory effort.    Heart:  +S1, +S2, Regular rate and rhythm, No edema. Abdomen:  Soft, nontender and nondistended. Normal bowel sounds, without guarding, and without rebound.   Neurologic:  Alert and  oriented x4;  grossly normal neurologically.  Impression/Plan: Opal Sidles is here for an endoscopy  to be performed for  evaluation of dysphagia    Risks, benefits, limitations, and alternatives regarding endoscopy have been reviewed with the patient.  Questions have been answered.  All parties agreeable.   Wyline Mood, MD  02/07/2023, 10:55 AM

## 2023-02-07 NOTE — Op Note (Signed)
[facility] Gastroenterology Patient Name: Erin Contreras Procedure Date: 02/07/2023 11:30 AM MRN: [id number] Account #: [id number] Date of Birth: 07-31-1961 Admit Type: Outpatient Age: 62 Room: [facility] ENDO ROOM 1 Gender: Female Note Status: Finalized Instrument Name: Patton Salles Endoscope [id number] Procedure:             Upper GI endoscopy Indications:           Dysphagia Providers:             Wyline Mood MD, MD Referring MD:          Dorie Rank. Harvest Dark (Referring MD) Medicines:             Monitored Anesthesia Care Complications:         No immediate complications. Procedure:             Pre-Anesthesia Assessment:                        - Prior to the procedure, a History and Physical was                         performed, and patient medications, allergies and                         sensitivities were reviewed. The patient's tolerance                         of previous anesthesia was reviewed.                        - The risks and benefits of the procedure and the                         sedation options and risks were discussed with the                         patient. All questions were answered and informed                         consent was obtained.                        - ASA Grade Assessment: II - A patient with mild                         systemic disease.                        After obtaining informed consent, the endoscope was                         passed under direct vision. Throughout the procedure,                         the patient's blood pressure, pulse, and oxygen                         saturations were monitored continuously. The Endoscope                         was introduced through  the mouth, and advanced to the                         third part of duodenum. The upper GI endoscopy was                         accomplished with ease. The patient tolerated the                         procedure well. Findings:      A medium-sized hiatal  hernia was present.      The examined duodenum was normal.      LA Grade C (one or more mucosal breaks continuous between tops of 2 or       more mucosal folds, less than 75% circumference) esophagitis with no       bleeding was found in the lower third of the esophagus.      The cardia and gastric fundus were normal on retroflexion. Impression:            - Medium-sized hiatal hernia.                        - Normal examined duodenum.                        - LA Grade C reflux esophagitis with no bleeding.                        - No specimens collected. Recommendation:        - Discharge patient to home (with escort).                        - Resume previous diet.                        - Continue present medications.                        - Repeat upper endoscopy in 3 months to evaluate the                         response to therapy.                        - Continue on Voquenza 20 mg a day for 8 weeks then                         decrease to 10 mg a day                        - Return to my office in 3 months. Procedure Code(s):     --- Professional ---                        [phone], Esophagogastroduodenoscopy, flexible,                         transoral; diagnostic, including collection of                         specimen(s)  by brushing or washing, when performed                         (separate procedure) Diagnosis Code(s):     --- Professional ---                        K44.9, Diaphragmatic hernia without obstruction or                         gangrene                        K21.00, Gastro-esophageal reflux disease with                         esophagitis, without bleeding                        R13.10, Dysphagia, unspecified CPT copyright 2022 American Medical Association. All rights reserved. The codes documented in this report are preliminary and upon coder review may  be revised to meet current compliance requirements. Wyline Mood, MD Wyline Mood MD, MD 02/07/2023 11:45:35  AM This report has been signed electronically. Number of Addenda: 0 Note Initiated On: 02/07/2023 11:30 AM Estimated Blood Loss:  Estimated blood loss: none.      [facility]

## 2023-02-07 NOTE — Anesthesia Postprocedure Evaluation (Signed)
Anesthesia Post Note  Patient: Erin Contreras  Procedure(s) Performed: ESOPHAGOGASTRODUODENOSCOPY (EGD) WITH PROPOFOL  Patient location during evaluation: PACU Anesthesia Type: General Level of consciousness: awake and alert Pain management: pain level controlled Vital Signs Assessment: post-procedure vital signs reviewed and stable Respiratory status: spontaneous breathing, nonlabored ventilation, respiratory function stable and patient connected to nasal cannula oxygen Cardiovascular status: blood pressure returned to baseline and stable Postop Assessment: no apparent nausea or vomiting Anesthetic complications: no   No notable events documented.   Last Vitals:  Vitals:   02/07/23 1148 02/07/23 1158  BP: (!) 93/47 115/85  Pulse: 69 64  Resp: 18 (!) 21  Temp: (!) 35.9 C   SpO2: 98% 94%    Last Pain:  Vitals:   02/07/23 1158  TempSrc:   PainSc: 0-No pain                 Yevette Edwards

## 2023-02-08 ENCOUNTER — Encounter: Payer: Self-pay | Admitting: Gastroenterology

## 2023-02-14 ENCOUNTER — Telehealth: Payer: Self-pay

## 2023-02-14 NOTE — Telephone Encounter (Signed)
Per pt MD  ( Dr. Tobi Bastos) gave her a sample of Voguezna. PT states it really helped her and would like to get a prescription for it.

## 2023-02-21 NOTE — Telephone Encounter (Signed)
Script was written on 01/26/2023 and sent to [facility]

## 2023-02-23 ENCOUNTER — Other Ambulatory Visit: Payer: Self-pay | Admitting: Student in an Organized Health Care Education/Training Program

## 2023-02-23 DIAGNOSIS — M47818 Spondylosis without myelopathy or radiculopathy, sacral and sacrococcygeal region: Secondary | ICD-10-CM

## 2023-02-23 DIAGNOSIS — G5702 Lesion of sciatic nerve, left lower limb: Secondary | ICD-10-CM

## 2023-02-23 DIAGNOSIS — M533 Sacrococcygeal disorders, not elsewhere classified: Secondary | ICD-10-CM

## 2023-02-23 DIAGNOSIS — G5701 Lesion of sciatic nerve, right lower limb: Secondary | ICD-10-CM

## 2023-02-23 DIAGNOSIS — G894 Chronic pain syndrome: Secondary | ICD-10-CM

## 2023-02-23 MED ORDER — PANTOPRAZOLE SODIUM 40 MG PO TBEC: 40.0000 mg | DELAYED_RELEASE_TABLET | Freq: Two times a day (BID) | ORAL | 3 refills | Status: DC

## 2023-02-23 MED ORDER — FAMOTIDINE 40 MG PO TABS: 40.0000 mg | ORAL_TABLET | Freq: Every day | ORAL | 3 refills | Status: DC

## 2023-02-23 NOTE — Addendum Note (Signed)
Addended by: Adela Ports on: 02/23/2023 02:09 PM   Modules accepted: Orders

## 2023-02-23 NOTE — Telephone Encounter (Signed)
Dr. Tobi Bastos, I called [facility] and Medicaid and they stated that this medication is not covered by them. [facility] also stated that they do not fill prescriptions with government insurance (medicaid). So, even though the samples of Voquezna had helped the patient, she is not able to qualify for assistance or purchase them either. So what other medication would you prescribe patient? She has only tried Prilosec 20 MG once a day and then it went up to 40 MG twice a day since 2015 according to her chart.Please advise on what you would like for her to try and hopefully her insurance would cover.

## 2023-02-23 NOTE — Telephone Encounter (Signed)
Called patient back and let her know that her insurance would not cover Voquezna. Therefore, Dr. Tobi Bastos was informed and he is recommending for her to take Protonix 40 MG twice a day and Famotidine 40 MG at night time daily. Patient agreed with the plan and stated that she would pick up her medications.

## 2023-02-23 NOTE — Telephone Encounter (Signed)
We can change to protonix 40 BID and famotidine 40 at night-

## 2023-04-12 ENCOUNTER — Ambulatory Visit
Payer: Medicaid Other | Attending: Student in an Organized Health Care Education/Training Program | Admitting: Student in an Organized Health Care Education/Training Program

## 2023-04-12 ENCOUNTER — Encounter: Payer: Self-pay | Admitting: Student in an Organized Health Care Education/Training Program

## 2023-04-12 VITALS — BP 132/94 | HR 81 | Temp 97.1°F | Ht 69.0 in | Wt 306.0 lb

## 2023-04-12 DIAGNOSIS — G5701 Lesion of sciatic nerve, right lower limb: Secondary | ICD-10-CM | POA: Diagnosis present

## 2023-04-12 DIAGNOSIS — G894 Chronic pain syndrome: Secondary | ICD-10-CM | POA: Diagnosis present

## 2023-04-12 DIAGNOSIS — G5702 Lesion of sciatic nerve, left lower limb: Secondary | ICD-10-CM | POA: Insufficient documentation

## 2023-04-12 DIAGNOSIS — M47818 Spondylosis without myelopathy or radiculopathy, sacral and sacrococcygeal region: Secondary | ICD-10-CM | POA: Insufficient documentation

## 2023-04-12 DIAGNOSIS — M533 Sacrococcygeal disorders, not elsewhere classified: Secondary | ICD-10-CM | POA: Insufficient documentation

## 2023-04-12 MED ORDER — DULOXETINE HCL 60 MG PO CPEP: 60.0000 mg | ORAL_CAPSULE | Freq: Every day | ORAL | 5 refills | Status: DC

## 2023-04-12 MED ORDER — PREGABALIN 150 MG PO CAPS: 150.0000 mg | ORAL_CAPSULE | Freq: Every day | ORAL | 2 refills | Status: DC

## 2023-04-12 NOTE — Progress Notes (Signed)
Safety precautions to be maintained throughout the outpatient stay will include: orient to surroundings, keep bed in low position, maintain call bell within reach at all times, provide assistance with transfer out of bed and ambulation.  
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
## 2023-04-12 NOTE — Progress Notes (Signed)
PROVIDER NOTE: Information contained herein reflects review and annotations entered in association with encounter. Interpretation of such information and data should be left to medically-trained personnel. Information provided to patient can be located elsewhere in the medical record under "Patient Instructions". Document created using STT-dictation technology, any transcriptional errors that may result from process are unintentional.    Patient: Erin Contreras  Service Category: E/M  Provider: Edward Jolly, MD  DOB: 1961-04-27  DOS: 04/12/2023  Specialty: Interventional Pain Management  MRN: [id number]  Setting: Ambulatory outpatient  PCP: Marjie Skiff, NP  Type: Established Patient    Referring Provider: Marjie Skiff, NP  Location: Office  Delivery: Face-to-face     HPI  Ms. TERILYN TSAN, a 62 y.o. year old female, is here today because of her SI joint arthritis [M47.818]. Ms. Hartvigsen primary complain today is Back Pain (lower)  Last encounter: My last encounter with her was on 10/14/22 Pertinent problems: Ms. Atcheson has OA (osteoarthritis) of knee; Obesity; Chronic radicular lumbar pain; Lumbar spondylosis; Lumbar degenerative disc disease; SI joint arthritis; and Chronic pain syndrome on their pertinent problem list. Pain Assessment: Severity of Chronic pain is reported as a 8 /10. Location: Back Lower/radiates down right hip and right leg to calf. Onset: More than a month ago. Quality: Aching, Constant. Timing: Constant. Modifying factor(s): heat. Vitals:  height is 5\' 9"  (1.753 m) and weight is 306 lb (138.8 kg) (abnormal). Her temperature is 97.1 F (36.2 C) (abnormal). Her blood pressure is 132/94 (abnormal) and her pulse is 81. Her oxygen saturation is 94%.   Reason for encounter: worsening of previously known (established) problem    Increased right> left low back, right and left buttock, right and left SI joint pain with occasional radiation to her groin.  She responded  favorably to her sacroiliac joint and  piriformis trigger point injection that was done last March 14th 2022 and Oct 2023 both of which provided 60% pain relief for 4 months.  Given increased pain which is now bilateral and physical exam findings as detailed below, recommend repeating sacroiliac joint and piriformis injection however on both sides with IV Versed under fluoroscopy.  Risks and benefits reviewed and patient like to proceed.   ROS  Constitutional: Denies any fever or chills Gastrointestinal: No reported hemesis, hematochezia, vomiting, or acute GI distress Musculoskeletal:  Right low back, right> left buttock, right >left hip, right >left sacroiliac joint pain Neurological: No reported episodes of acute onset apraxia, aphasia, dysarthria, agnosia, amnesia, paralysis, loss of coordination, or loss of consciousness  Medication Review  Cholecalciferol, DULoxetine, Vitamin D3, acyclovir, famotidine, folic acid, hydroxychloroquine, meloxicam, methotrexate, metroNIDAZOLE, pantoprazole, and pregabalin  History Review  Allergy: Ms. Lasich has No Known Allergies. Drug: Ms. Garver  reports no history of drug use. Alcohol:  reports no history of alcohol use. Tobacco:  reports that she has never smoked. She has never used smokeless tobacco. Social: Ms. Grennell  reports that she has never smoked. She has never used smokeless tobacco. She reports that she does not drink alcohol and does not use drugs. Medical:  has a past medical history of Boils, Ear mass, GERD (gastroesophageal reflux disease), Hemorrhoids, Herpes simplex virus infection, HSV infection, Lumbago with sciatica, OA (osteoarthritis) of knee, Obesity, Overactive bladder, PONV (postoperative nausea and vomiting), Pre-diabetes, RA (rheumatoid arthritis) (HCC), Sleep apnea, and Urge and stress incontinence. Surgical: Ms. Current  has a past surgical history that includes Cholecystectomy (2011); Tumor removal (Right, 2010); Sleeve  Gastroplasty; Fracture surgery (Right);  Knee Arthroplasty (Right, 07/11/2017); Knee Arthroplasty (Left, 01/28/2021); Joint replacement (Left, 01/28/2021); Colonoscopy (N/A, 03/19/2021); and Esophagogastroduodenoscopy (egd) with propofol (N/A, 02/07/2023). Family: family history includes Cancer in her mother; Cirrhosis in her father; Diabetes in her mother, paternal grandfather, and paternal grandmother; Heart disease in her paternal grandfather and paternal grandmother; Hyperlipidemia in her mother; Hypertension in her daughter and mother; Stroke in her maternal grandfather and maternal grandmother.  Laboratory Chemistry Profile   Renal Lab Results  Component Value Date   BUN 15 01/25/2023   CREATININE 0.75 01/25/2023   BCR 20 01/25/2023   GFRAA 102 06/12/2020   GFRNONAA >60 01/05/2022     Hepatic Lab Results  Component Value Date   AST 21 01/25/2023   ALT 22 01/25/2023   ALBUMIN 3.9 01/25/2023   ALKPHOS 121 01/25/2023   LIPASE 106 05/10/2014     Electrolytes Lab Results  Component Value Date   NA 141 01/25/2023   K 4.3 01/25/2023   CL 105 01/25/2023   CALCIUM 9.3 01/25/2023   MG 2.0 10/08/2022   PHOS 3.7 09/04/2014     Bone Lab Results  Component Value Date   VD25OH 44.7 01/25/2023     Inflammation (CRP: Acute Phase) (ESR: Chronic Phase) Lab Results  Component Value Date   CRP 0.6 01/21/2021   ESRSEDRATE 43 (H) 01/21/2021       Note: Above Lab results reviewed.  Recent Imaging Review  MM 3D SCREEN BREAST BILATERAL CLINICAL DATA:  Screening.  EXAM: DIGITAL SCREENING BILATERAL MAMMOGRAM WITH TOMOSYNTHESIS AND CAD  TECHNIQUE: Bilateral screening digital craniocaudal and mediolateral oblique mammograms were obtained. Bilateral screening digital breast tomosynthesis was performed. The images were evaluated with computer-aided detection.  COMPARISON:  Previous exam(s).  ACR Breast Density Category b: There are scattered areas of fibroglandular  density.  FINDINGS: There are no findings suspicious for malignancy.  IMPRESSION: No mammographic evidence of malignancy. A result letter of this screening mammogram will be mailed directly to the patient.  RECOMMENDATION: Screening mammogram in one year. (Code:SM-B-01Y)  BI-RADS CATEGORY  1: Negative.  Electronically Signed   By: Ted Mcalpine M.D.   On: 10/11/2022 16:57  Note: Reviewed        Physical Exam  General appearance: Well nourished, well developed, and well hydrated. In no apparent acute distress Mental status: Alert, oriented x 3 (person, place, & time)       Respiratory: No evidence of acute respiratory distress Eyes: PERLA Vitals: BP (!) 132/94   Pulse 81   Temp (!) 97.1 F (36.2 C)   Ht 5\' 9"  (1.753 m)   Wt (!) 306 lb (138.8 kg)   SpO2 94%   BMI 45.19 kg/m  BMI: Estimated body mass index is 45.19 kg/m as calculated from the following:   Height as of this encounter: 5\' 9"  (1.753 m).   Weight as of this encounter: 306 lb (138.8 kg). Ideal: Ideal body weight: 66.2 kg (145 lb 15.1 oz) Adjusted ideal body weight: 95.2 kg (209 lb 15.5 oz)  Lumbar Spine Area Exam  Skin & Axial Inspection: No masses, redness, or swelling Alignment: Symmetrical Functional ROM: Pain restricted ROM affecting primarily the right Stability: No instability detected Muscle Tone/Strength: Functionally intact. No obvious neuro-muscular anomalies detected. Sensory (Neurological): Musculoskeletal pain pattern  Provocative Tests:  Patrick's Maneuver: (+) for right-sided S-I arthralgia            and left-sided FABER* test: (+) for right-sided S-I arthralgia  and left-sided S-I anterior distraction/compression test: (+) Right-sided S-I arthralgia/arthropathy and left-sided S-I lateral compression test: (+) Right-sided S-I arthralgia/arthropathy and left-sided  Gait & Posture Assessment  Ambulation: Limited Gait: Antalgic Posture: Difficulty standing up straight,  due to pain  Lower Extremity Exam    Side: Right lower extremity  Side: Left lower extremity  Stability: No instability observed          Stability: No instability observed          Skin & Extremity Inspection: Skin color, temperature, and hair growth are WNL. No peripheral edema or cyanosis. No masses, redness, swelling, asymmetry, or associated skin lesions. No contractures.  Skin & Extremity Inspection: Skin color, temperature, and hair growth are WNL. No peripheral edema or cyanosis. No masses, redness, swelling, asymmetry, or associated skin lesions. No contractures.  Functional ROM: Pain restricted ROM for hip and knee joints          Functional ROM: Unrestricted ROM                  Muscle Tone/Strength: Functionally intact. No obvious neuro-muscular anomalies detected.  Muscle Tone/Strength: Functionally intact. No obvious neuro-muscular anomalies detected.  Sensory (Neurological): Arthropathic arthralgia        Sensory (Neurological): Unimpaired        DTR: Patellar: deferred today Achilles: deferred today Plantar: deferred today  DTR: Patellar: deferred today Achilles: deferred today Plantar: deferred today  Palpation: No palpable anomalies  Palpation: No palpable anomalies    Assessment   Status Diagnosis  Having a Flare-up Having a Flare-up Having a Flare-up 1. SI joint arthritis   2. Piriformis syndrome of left side   3. Piriformis syndrome of right side   4. Sacroiliac joint pain   5. Chronic pain syndrome      Updated Problems: No problems updated.   Plan of Care  [name] has a current medication list which includes the following long-term medication(s): duloxetine, famotidine, pantoprazole, pregabalin, and pregabalin.  -Increase Lyrica to 150 mg at bedtime.   Has failed gabapentin. -Increased bilateral sacroiliac joint pain and piriformis pain.  Physical exam findings consistent with SI joint dysfunction.  Has had positive response to  SI and  piriformis TPI in past, repeat  Orders:  Orders Placed This Encounter  Procedures   SACROILIAC JOINT INJECTION    Standing Status:   Future    Standing Expiration Date:   07/12/2023    Scheduling Instructions:     Side: Bilateral     Sedation: IV Versed     Timeframe: ASAP    Order Specific Question:   Where will this procedure be performed?    Answer:   [facility]   TRIGGER POINT INJECTION    Area: Buttocks region (gluteal area) Indications: Piriformis muscle pain; Bilateral (G57.03) piriformis-syndrome; piriformis muscle spasms (Z61.096). CPT code: 04540    Scheduling Instructions:     Type: Myoneural block (TPI) of piriformis muscle.     Side:  B/L     Sedation: Patient's choice.     Timeframe: Today    Order Specific Question:   Where will this procedure be performed?    Answer:   [facility]   Follow-up plan:   Return in about 15 days (around 04/27/2023) for B/L SiJ and B/L Piriformis TPI, in clinic IV Versed.    Recent Visits No visits were found meeting these conditions. Showing recent visits within past 90 days and meeting all other requirements Today's Visits  Date Type Provider Dept  04/12/23 Office Visit Edward Jolly, MD [facility]  Showing today's visits and meeting all other requirements Future Appointments No visits were found meeting these conditions. Showing future appointments within next 90 days and meeting all other requirements  I discussed the assessment and treatment plan with the patient. The patient was provided an opportunity to ask questions and all were answered. The patient agreed with the plan and demonstrated an understanding of the instructions.  Patient advised to call back or seek an in-person evaluation if the symptoms or condition worsens.  Duration of encounter: 30 minutes.  Note by: Edward Jolly, MD Date: 04/12/2023; Time: 1:11 PM

## 2023-04-18 ENCOUNTER — Ambulatory Visit: Payer: Medicaid Other | Admitting: Gastroenterology

## 2023-04-18 NOTE — Progress Notes (Deleted)
Erin Mood MD, MRCP(U.K) [address]  [address]  [place], [place] [address]  Main: [phone]  Fax: [phone]   Primary Care Physician: Erin Skiff, NP  Primary Gastroenterologist:  Dr. Wyline Contreras   No chief complaint on file.   HPI: Erin Contreras is a 62 y.o. female   Summary of history : She was initially referred and seen in 01/2023 for ongoing heartburn despite being on 40 mg of Protonix twice daily has had workup for chest pain from the cardiac standpoint which has been negative.   03/19/2021: Colonoscopy: No polyps repeat in 10 years No recent abdominal or chest imaging. 12/28/2022 hemoglobin 12.6 g, CMP normal.   State.  s that she has a issues with reflux for many years.  Takes her Protonix before meals daily.  But she lays down flat right after she eats.  She has had no change in her weight recently.  Some issues with swallowing particularly for solids.  Complains of heartburn which wakes her up in the middle of the night   Interval history  01/18/2023-04/18/2023  02/07/2023: EGD: Medium sized hiatal hernia, LA grade c esophagitis seen   ***   Current Outpatient Medications  Medication Sig Dispense Refill   acyclovir (ZOVIRAX) 400 MG tablet Take 1 tablet (400 mg total) by mouth 2 (two) times daily as needed. 180 tablet 4   Cholecalciferol (VITAMIN D3) 1.25 MG (50000 UT) CAPS Take 1 capsule by mouth once a week.     Cholecalciferol 1.25 MG (50000 UT) TABS Take 1 tablet by mouth once a week. 12 tablet 4   DULoxetine (CYMBALTA) 60 MG capsule Take 1 capsule (60 mg total) by mouth daily. 30 capsule 5   famotidine (PEPCID) 40 MG tablet Take 1 tablet (40 mg total) by mouth at bedtime. 90 tablet 3   folic acid (FOLVITE) 1 MG tablet Take 1 tablet by mouth daily.     hydroxychloroquine (PLAQUENIL) 200 MG tablet Take 200 mg by mouth 2 (two) times daily.     meloxicam (MOBIC) 15 MG tablet Take 15 mg by mouth daily.     methotrexate (RHEUMATREX) 2.5 MG tablet  Take 15 mg by mouth once a week.     metroNIDAZOLE (METROGEL) 0.75 % vaginal gel Place application twice a week into vaginal area for the next 6 months. 70 g 4   pantoprazole (PROTONIX) 40 MG tablet Take 1 tablet (40 mg total) by mouth 2 (two) times daily. 180 tablet 3   pregabalin (LYRICA) 100 MG capsule Take 1 capsule (100 mg total) by mouth at bedtime as needed. 30 capsule 5   pregabalin (LYRICA) 150 MG capsule Take 1 capsule (150 mg total) by mouth at bedtime. 30 capsule 2   No current facility-administered medications for this visit.    Allergies as of 04/18/2023   (No Known Allergies)      ROS:  General: Negative for anorexia, weight loss, fever, chills, fatigue, weakness. ENT: Negative for hoarseness, difficulty swallowing , nasal congestion. CV: Negative for chest pain, angina, palpitations, dyspnea on exertion, peripheral edema.  Respiratory: Negative for dyspnea at rest, dyspnea on exertion, cough, sputum, wheezing.  GI: See history of present illness. GU:  Negative for dysuria, hematuria, urinary incontinence, urinary frequency, nocturnal urination.  Endo: Negative for unusual weight change.    Physical Examination:   There were no vitals taken for this visit.  General: Well-nourished, well-developed in no acute distress.  Eyes: No icterus. Conjunctivae pink. Mouth: Oropharyngeal  mucosa moist and pink , no lesions erythema or exudate. Lungs: Clear to auscultation bilaterally. Non-labored. Heart: Regular rate and rhythm, no murmurs rubs or gallops.  Abdomen: Bowel sounds are normal, nontender, nondistended, no hepatosplenomegaly or masses, no abdominal bruits or hernia , no rebound or guarding.   Extremities: No lower extremity edema. No clubbing or deformities. Neuro: Alert and oriented x 3.  Grossly intact. Skin: Warm and dry, no jaundice.   Psych: Alert and cooperative, normal Contreras and affect.   Imaging Studies: No results found.  Assessment and Plan:    [name] is a 62 y.o. y/o female  for heartburn despite being on Protonix 40 mg twice daily in addition she has some issues with dysphagia.  Likely due to the reflux may have underlying stricture.     Plan 1.  EGD to rule out eosinophilic esophagitis and check for healing of esophagitis.  Discussed about lifestyle changes for acid reflux including weight loss, timing of meals, use of a wedge pillow, avoid eating for 2 hours before bedtime.  Patient information will be provided.Will need to be referred for hiatal hernia repair  2.   Stop PPI trial of Voquenza 20 mg a day samples will be provided for 2 weeks if it works she should call us for prescription      Dr Erin Mood  MD,MRCP [facility]) Follow up in ***  BP check ***

## 2023-04-26 DIAGNOSIS — M059 Rheumatoid arthritis with rheumatoid factor, unspecified: Secondary | ICD-10-CM | POA: Diagnosis not present

## 2023-04-29 ENCOUNTER — Other Ambulatory Visit: Payer: Self-pay | Admitting: Family Medicine

## 2023-04-29 NOTE — Telephone Encounter (Signed)
Requested medication (s) are due for refill today:   No   It was discontinued 10/03/2022 because he completed the therapy  Requested medication (s) are on the active medication list:   Listed as being discontinued  Future visit scheduled:   Yes 07/28/2023   Last ordered: Discontinued 10/03/2022  Non delegated refill that has been discontinued   Requested Prescriptions  Pending Prescriptions Disp Refills   predniSONE (DELTASONE) 10 MG tablet [Pharmacy Med Name: PREDNISONE 10 MG TABLET] 21 tablet 0    Sig: TAKE 6 TABLETS BY MOUTH ON DAY 1, THEN DECREASE BY 1 TABLET DAILY UNTIL GONE (6,5,4,3,2,1)     Not Delegated - Endocrinology:  Oral Corticosteroids Failed - 04/29/2023 10:13 AM      Failed - This refill cannot be delegated      Failed - Manual Review: Eye exam for IOP if prolonged treatment      Failed - Last BP in normal range    BP Readings from Last 1 Encounters:  04/12/23 (!) 132/94         Failed - Bone Mineral Density or Dexa Scan completed in the last 2 years      Passed - Glucose (serum) in normal range and within 180 days    Glucose  Date Value Ref Range Status  01/25/2023 79 70 - 99 mg/dL Final  29/56/2130 865 (H) 65 - 99 mg/dL Final   Glucose, Bld  Date Value Ref Range Status  01/05/2022 108 (H) 70 - 99 mg/dL Final    Comment:    Glucose reference range applies only to samples taken after fasting for at least 8 hours.         Passed - K in normal range and within 180 days    Potassium  Date Value Ref Range Status  01/25/2023 4.3 3.5 - 5.2 mmol/L Final  09/04/2014 3.9 3.5 - 5.1 mmol/L Final         Passed - Na in normal range and within 180 days    Sodium  Date Value Ref Range Status  01/25/2023 141 134 - 144 mmol/L Final  09/04/2014 138 136 - 145 mmol/L Final         Passed - Valid encounter within last 6 months    Recent Outpatient Visits           3 months ago Depression, major, single episode, moderate (HCC)   [place] [facility]  [place], Moro T, NP   6 months ago Rheumatoid arthritis involving multiple sites with positive rheumatoid factor (HCC)   [facility] [place], Corrie Dandy T, NP   7 months ago Vaginal discharge   [place] [facility] [place], [place], DO   8 months ago Right foot pain   [place] [facility] [place], Megan P, DO   11 months ago Rheumatoid arthritis involving multiple sites with positive rheumatoid factor (HCC)   [place] [facility] [place], Dorie Rank, NP       Future Appointments             In 3 months [place], Dorie Rank, NP [place] [facility]

## 2023-05-04 ENCOUNTER — Ambulatory Visit: Payer: Self-pay

## 2023-05-04 NOTE — Telephone Encounter (Signed)
ummary: urinary frequency.   Pt is experiencing urinary frequency. Denied abdominal pain. Stated going on for about a month.  Stated has a knot on his left elbow that hurts.      Voice mailbox has not been set up. Unable to leave a message.

## 2023-05-04 NOTE — Telephone Encounter (Signed)
     Chief Complaint: Urinary frequency Symptoms: Above Frequency: 1 month ago Pertinent Negatives: Patient denies any other symptoms Disposition: [] ED /[] Urgent Care (no appt availability in office) / [x] Appointment(In office/virtual)/ []  [facility]/ [] Home Care/ [] Refused Recommended Disposition /[] [place] [facility]/ []  Follow-up with PCP Additional Notes: Pt. Agrees with appointment.  Reason for Disposition  Urinating more frequently than usual (i.e., frequency)  Answer Assessment - Initial Assessment Questions 1. SYMPTOM: "What's the main symptom you're concerned about?" (e.g., frequency, incontinence)     Frequency 2. ONSET: "When did the    start?"     1 month ago 3. PAIN: "Is there any pain?" If Yes, ask: "How bad is it?" (Scale: 1-10; mild, moderate, severe)     No 4. CAUSE: "What do you think is causing the symptoms?"     Maybe UTI 5. OTHER SYMPTOMS: "Do you have any other symptoms?" (e.g., blood in urine, fever, flank pain, pain with urination)     No 6. PREGNANCY: "Is there any chance you are pregnant?" "When was your last menstrual period?"     No  Protocols used: Urinary Symptoms-A-AH

## 2023-05-05 ENCOUNTER — Ambulatory Visit: Payer: Medicaid Other | Admitting: Nurse Practitioner

## 2023-05-05 ENCOUNTER — Encounter: Payer: Self-pay | Admitting: Nurse Practitioner

## 2023-05-05 VITALS — BP 130/82 | HR 88 | Ht 69.0 in | Wt 308.2 lb

## 2023-05-05 DIAGNOSIS — R399 Unspecified symptoms and signs involving the genitourinary system: Secondary | ICD-10-CM | POA: Diagnosis not present

## 2023-05-05 DIAGNOSIS — Z23 Encounter for immunization: Secondary | ICD-10-CM | POA: Diagnosis not present

## 2023-05-05 DIAGNOSIS — R8281 Pyuria: Secondary | ICD-10-CM | POA: Diagnosis not present

## 2023-05-05 DIAGNOSIS — B9689 Other specified bacterial agents as the cause of diseases classified elsewhere: Secondary | ICD-10-CM

## 2023-05-05 DIAGNOSIS — N760 Acute vaginitis: Secondary | ICD-10-CM | POA: Diagnosis not present

## 2023-05-05 LAB — MICROSCOPIC EXAMINATION

## 2023-05-05 LAB — URINALYSIS, ROUTINE W REFLEX MICROSCOPIC
Bilirubin, UA: NEGATIVE
Glucose, UA: NEGATIVE
Ketones, UA: NEGATIVE
Nitrite, UA: NEGATIVE
RBC, UA: NEGATIVE
Specific Gravity, UA: >1.03 — ABNORMAL HIGH (ref 1.005–1.030)
Urobilinogen, Ur: 0.2 mg/dL (ref 0.2–1.0)
pH, UA: 6 (ref 5.0–7.5)

## 2023-05-05 LAB — WET PREP FOR TRICH, YEAST, CLUE
Clue Cell Exam: POSITIVE — AB
Trichomonas Exam: NEGATIVE
Yeast Exam: POSITIVE — AB

## 2023-05-05 MED ORDER — CHOLECALCIFEROL 1.25 MG (50000 UT) PO TABS: 1.0000 | ORAL_TABLET | ORAL | 4 refills | Status: DC

## 2023-05-05 MED ORDER — FLUCONAZOLE 150 MG PO TABS: 150.0000 mg | ORAL_TABLET | Freq: Once | ORAL | 0 refills | Status: AC

## 2023-05-05 MED ORDER — METRONIDAZOLE 500 MG PO TABS: 500.0000 mg | ORAL_TABLET | Freq: Two times a day (BID) | ORAL | 0 refills | Status: AC

## 2023-05-05 NOTE — Progress Notes (Signed)
BP 130/82   Pulse 88   Ht 5\' 9"  (1.753 m)   Wt (!) 308 lb 3.2 oz (139.8 kg)   SpO2 96%   BMI 45.51 kg/m    Subjective:    Patient ID: Erin Contreras, female    DOB: 27-May-1961, 62 y.o.   MRN: [id number]  HPI: Erin Contreras is a 62 y.o. female  Chief Complaint  Patient presents with   Urinary Frequency    Patient says she has been noticing that she has been going to the bathroom or having the urgent to go more often. Patient says she first noticed symptoms about a month ago. Patient denies any other symptoms.    URINARY SYMPTOMS Symptoms started weeks ago. Dysuria: no Urinary frequency: yes Urgency:  sometimes Small volume voids: yes Symptom severity: yes Urinary incontinence: no Foul odor: yes Hematuria: no Abdominal pain: no Back pain: some back pain Suprapubic pain/pressure: no Flank pain: no Fever:  no Vomiting: no Status: stable Previous urinary tract infection: yes Recurrent urinary tract infection: no Sexual activity: No sexually active History of sexually transmitted disease: no Treatments attempted: increasing fluids    Relevant past medical, surgical, family and social history reviewed and updated as indicated. Interim medical history since our last visit reviewed. Allergies and medications reviewed and updated.  Review of Systems  Constitutional: Negative.   Respiratory: Negative.    Cardiovascular: Negative.   Gastrointestinal: Negative.   Genitourinary:  Positive for frequency, urgency and vaginal discharge. Negative for decreased urine volume, difficulty urinating, dysuria, flank pain, hematuria and vaginal pain.  Neurological: Negative.   Psychiatric/Behavioral: Negative.      Per HPI unless specifically indicated above     Objective:    BP 130/82   Pulse 88   Ht 5\' 9"  (1.753 m)   Wt (!) 308 lb 3.2 oz (139.8 kg)   SpO2 96%   BMI 45.51 kg/m   Wt Readings from Last 3 Encounters:  05/05/23 (!) 308 lb 3.2 oz (139.8 kg)  04/12/23 (!)  306 lb (138.8 kg)  02/07/23 (!) 309 lb (140.2 kg)    Physical Exam Vitals and nursing note reviewed.  Constitutional:      General: She is awake. She is not in acute distress.    Appearance: She is well-developed and well-groomed. She is obese. She is not ill-appearing.  HENT:     Head: Normocephalic.     Right Ear: Hearing normal.     Left Ear: Hearing normal.  Eyes:     General: Lids are normal.        Right eye: No discharge.        Left eye: No discharge.     Conjunctiva/sclera: Conjunctivae normal.     Pupils: Pupils are equal, round, and reactive to light.  Neck:     Vascular: No carotid bruit.  Cardiovascular:     Rate and Rhythm: Normal rate and regular rhythm.     Heart sounds: Normal heart sounds. No murmur heard.    No gallop.  Pulmonary:     Effort: Pulmonary effort is normal. No accessory muscle usage or respiratory distress.     Breath sounds: Normal breath sounds.  Abdominal:     General: Bowel sounds are normal. There is no distension.     Palpations: Abdomen is soft.     Tenderness: There is no abdominal tenderness. There is no right CVA tenderness or left CVA tenderness.  Musculoskeletal:     Cervical back: Normal  range of motion and neck supple.     Right lower leg: No edema.     Left lower leg: No edema.  Skin:    General: Skin is warm and dry.  Neurological:     Mental Status: She is alert and oriented to person, place, and time.  Psychiatric:        Attention and Perception: Attention normal.        Mood and Affect: Mood normal.        Speech: Speech normal.        Behavior: Behavior normal. Behavior is cooperative.        Thought Content: Thought content normal.     Results for orders placed or performed in visit on 05/05/23  WET PREP FOR TRICH, YEAST, CLUE   Specimen: Urine   Urine  Result Value Ref Range   Trichomonas Exam Negative Negative   Yeast Exam Positive (A) Negative   Clue Cell Exam Positive (A) Negative  Microscopic  Examination   Urine  Result Value Ref Range   WBC, UA 6-10 (A) 0 - 5 /hpf   RBC, Urine 0-2 0 - 2 /hpf   Epithelial Cells (non renal) 0-10 0 - 10 /hpf   Mucus, UA Present (A) Not Estab.   Bacteria, UA Few None seen/Few   Yeast, UA Present (A) None seen  Urinalysis, Routine w reflex microscopic  Result Value Ref Range   Specific Gravity, UA >1.030 (H) 1.005 - 1.030   pH, UA 6.0 5.0 - 7.5   Color, UA Yellow Yellow   Appearance Ur Cloudy (A) Clear   Leukocytes,UA 1+ (A) Negative   Protein,UA Trace (A) Negative/Trace   Glucose, UA Negative Negative   Ketones, UA Negative Negative   RBC, UA Negative Negative   Bilirubin, UA Negative Negative   Urobilinogen, Ur 0.2 0.2 - 1.0 mg/dL   Nitrite, UA Negative Negative   Microscopic Examination See below:       Assessment & Plan:   Problem List Items Addressed This Visit       Genitourinary   Bacterial vaginosis - Primary    Acute, has had similar episodes in past.  Wet prep + for yeast and clue cells.  UA has some abnormal findings, will send for culture and treat as needed.  For now start Flagyl 500 MG BID for 7 days and one dose of Diflucan.  Recommend she trial a probiotic daily for vaginal health.  Could consider vaginal estrace in future if ongoing as suspect some of this recurrence is related to atrophy.      Relevant Medications   metroNIDAZOLE (FLAGYL) 500 MG tablet   fluconazole (DIFLUCAN) 150 MG tablet   Other Relevant Orders   Urinalysis, Routine w reflex microscopic (Completed)   WET PREP FOR TRICH, YEAST, CLUE (Completed)   Other Visit Diagnoses     Pyuria       Urine sent for culture.   Relevant Orders   Urine Culture   Flu vaccine need       Flu vaccine in office today, educated patient.   Relevant Orders   Flu vaccine trivalent PF, 6mos and older(Flulaval,Afluria,Fluarix,Fluzone) (Completed)        Follow up plan: Return for as scheduled December 26th.

## 2023-05-05 NOTE — Patient Instructions (Signed)

## 2023-05-05 NOTE — Assessment & Plan Note (Signed)
Acute, has had similar episodes in past.  Wet prep + for yeast and clue cells.  UA has some abnormal findings, will send for culture and treat as needed.  For now start Flagyl 500 MG BID for 7 days and one dose of Diflucan.  Recommend she trial a probiotic daily for vaginal health.  Could consider vaginal estrace in future if ongoing as suspect some of this recurrence is related to atrophy.

## 2023-05-08 LAB — URINE CULTURE

## 2023-05-09 ENCOUNTER — Other Ambulatory Visit: Payer: Self-pay | Admitting: Nurse Practitioner

## 2023-05-09 MED ORDER — NITROFURANTOIN MONOHYD MACRO 100 MG PO CAPS: 100.0000 mg | ORAL_CAPSULE | Freq: Two times a day (BID) | ORAL | 0 refills | Status: AC

## 2023-05-09 NOTE — Progress Notes (Signed)
Please let Neyla know her urine did return showing infection present.  I am sending in Macrobid to treat with.  Any questions?

## 2023-05-19 ENCOUNTER — Encounter: Payer: Self-pay | Admitting: Nurse Practitioner

## 2023-05-19 ENCOUNTER — Telehealth: Payer: Self-pay

## 2023-05-19 ENCOUNTER — Ambulatory Visit: Payer: Medicaid Other | Admitting: Nurse Practitioner

## 2023-05-19 VITALS — BP 133/84 | HR 80 | Temp 98.4°F | Resp 20 | Ht 69.0 in | Wt 310.0 lb

## 2023-05-19 DIAGNOSIS — G8929 Other chronic pain: Secondary | ICD-10-CM | POA: Diagnosis not present

## 2023-05-19 DIAGNOSIS — Z6841 Body Mass Index (BMI) 40.0 and over, adult: Secondary | ICD-10-CM | POA: Diagnosis not present

## 2023-05-19 DIAGNOSIS — M25562 Pain in left knee: Secondary | ICD-10-CM | POA: Diagnosis not present

## 2023-05-19 DIAGNOSIS — E66813 Obesity, class 3: Secondary | ICD-10-CM | POA: Diagnosis not present

## 2023-05-19 MED ORDER — WEGOVY 0.25 MG/0.5ML ~~LOC~~ SOAJ: 0.2500 mg | SUBCUTANEOUS | 0 refills | Status: DC

## 2023-05-19 MED ORDER — WEGOVY 0.5 MG/0.5ML ~~LOC~~ SOAJ: 0.5000 mg | SUBCUTANEOUS | 0 refills | Status: DC

## 2023-05-19 NOTE — Telephone Encounter (Signed)
PA for [facility] initiated and submitted via Cover My Meds. Key: [id number]

## 2023-05-19 NOTE — Patient Instructions (Signed)
Chronic Knee Pain, Adult Knee pain that lasts longer than 3 months is called chronic knee pain. You may have pain in one or both knees. Symptoms of chronic knee pain may also include swelling and stiffness. The most common cause is age-related wear and tear (osteoarthritis) of your knee joint. Many conditions can cause chronic knee pain. Treatment depends on the cause. The main treatments are physical therapy and weight loss. It may also be treated with medicines, injections, a knee sleeve or brace, and by using crutches. Rest, ice, pressure (compression), and elevation, also known as RICE therapy, may also be recommended. Follow these instructions at home: If you have a knee sleeve or brace:  Wear the knee sleeve or brace as told by your doctor. Take it off only as told by your doctor. Loosen it if your toes: Tingle. Become numb. Turn cold and blue. Keep it clean. If the sleeve or brace is not waterproof: Do not let it get wet. Ask your doctor if you may take it off when you take a bath or shower. If not, cover it with a watertight covering. Managing pain, stiffness, and swelling     If told, put heat on your knee. Do this as often as told by your doctor. Use the heat source that your doctor recommends, such as a moist heat pack or a heating pad. If you have a removable knee sleeve or brace, take it off as told by your doctor. Place a towel between your skin and the heat source. Leave the heat on for 20-30 minutes. Take off the heat if your skin turns bright red. This is very important. If you cannot feel pain, heat, or cold, you have a greater risk of getting burned. If told, put ice on your knee. To do this: If you have a removable knee sleeve or brace, take it off as told by your doctor. Put ice in a plastic bag. Place a towel between your skin and the bag. Leave the ice on for 20 minutes, 2-3 times a day. Take off the ice if your skin turns bright red. This is very important. If you  cannot feel pain, heat, or cold, you have a greater risk of damage to the area. Move your toes often. Raise the injured area above the level of your heart while you are sitting or lying down. Activity Avoid activities where both feet leave the ground at the same time (high-impact activities). Examples are running, jumping rope, and doing jumping jacks. Follow the exercise plan that your doctor makes for you. Your doctor may suggest that you: Avoid activities that make knee pain worse. You may need to change the exercises that you do, the sports that you participate in, or your job duties. Wear shoes with cushioned soles. Avoid sports that require running and sudden changes in direction. Do exercises or physical therapy. This is planned to match your needs and your abilities. Do exercises that increase your balance and strength, such as tai chi and yoga. Do not use your injured knee to support your body weight until your doctor says that you can. Use crutches as told by your doctor. Return to your normal activities when your doctor says that it is safe. General instructions Take over-the-counter and prescription medicines only as told by your doctor. If you are overweight, work with your doctor and a food expert (dietitian) to set goals to lose weight. Being overweight can make your knee hurt more. Do not smoke or use any  products that contain nicotine or tobacco. If you need help quitting, ask your doctor. Keep all follow-up visits. Contact a doctor if: You have knee pain that is not getting better or gets worse. You are not able to do your exercises due to knee pain. Get help right away if: Your knee swells and the swelling gets worse. You cannot move your knee. You have very bad knee pain. Summary Knee pain that lasts more than 3 months is called chronic knee pain. The main treatments for chronic knee pain are physical therapy and weight loss. You may also need to take medicines, wear a  knee sleeve or brace, use crutches, and put ice or heat on your knee. Lose weight if you are overweight. Work with your doctor and a food expert (dietitian) to help you set goals to lose weight. Being overweight can make your knee hurt more. Follow the exercise plan that your doctor makes for you. This information is not intended to replace advice given to you by your health care provider. Make sure you discuss any questions you have with your health care provider. Document Revised: 01/01/2020 Document Reviewed: 01/02/2020 Elsevier Patient Education  2024 [employer].

## 2023-05-19 NOTE — Assessment & Plan Note (Signed)
BMI 45.78 with underlying prediabetes and RA.  Would benefit modest weight loss which we discussed today.  No family history of thyroid cancer (MTC, MEN 2, thyroid cell tumors) or pancreatitis.   We will work on starting Wegovy, educated her at length on this medication and use + side effects.  Recommended eating smaller high protein, low fat meals more frequently and exercising 30 mins a day 5 times a week with a goal of 10-15lb weight loss in the next 3 months. Patient voiced their understanding and motivation to adhere to these recommendations.

## 2023-05-19 NOTE — Progress Notes (Signed)
Acute Office Visit  Subjective:     Patient ID: Erin Contreras, female    DOB: 11-Feb-1961, 62 y.o.   MRN: [id number]  Chief Complaint  Patient presents with   Knee Pain    Patient states she has been having L knee pain for the last few months. Patient describes the pain as an aching feeling. States the pain was coming and going but is more constant recently. No known injuries per patient.     Has history of knee replacement to both knees -- right 2018 and left 2020.  Pain currently present for over 3 months, worsening over past week.  No recent injuries or falls. Has been gradually worsening.  Can hardly get up and down.  Dr. Ernest Pine performed knee replacements in past.  Both knees hurt, but left is worse then right.  She is interested in weight loss.  Has struggled with this for some time and recent A1c was trending up to 6.3%. Has underlying RA and sees rheumatology.  No family history of thyroid cancer (MTC, MEN 2, thyroid cell tumors) or pancreatitis.   We discussed weight loss medications.  Knee Pain  The incident occurred more than 1 week ago. Incident location: no recent injuries. There was no injury mechanism. The pain is present in the left knee. The pain is at a severity of 7/10. The pain is moderate. The pain has been Intermittent since onset. Associated symptoms include an inability to bear weight, a loss of motion and muscle weakness. Pertinent negatives include no loss of sensation, numbness or tingling. She reports no foreign bodies present. The symptoms are aggravated by movement and weight bearing. She has tried non-weight bearing and rest for the symptoms. The treatment provided mild relief.   Patient is in today for left knee pain that has been present for over 3 months, but this has been worsening.    Review of Systems  Constitutional:  Negative for chills, fever and malaise/fatigue.  Respiratory:  Negative for cough, sputum production, shortness of breath and wheezing.    Cardiovascular:  Negative for chest pain, palpitations, orthopnea and leg swelling.  Gastrointestinal: Negative.   Musculoskeletal:  Positive for joint pain. Negative for falls.  Neurological:  Negative for dizziness, tingling, sensory change, focal weakness, weakness and numbness.  Psychiatric/Behavioral: Negative.        Objective:    BP 133/84   Pulse 80   Temp 98.4 F (36.9 C) (Oral)   Resp 20   Ht 5\' 9"  (1.753 m)   Wt (!) 310 lb (140.6 kg)   SpO2 96%   BMI 45.78 kg/m  BP Readings from Last 3 Encounters:  05/19/23 133/84  05/05/23 130/82  04/12/23 (!) 132/94   Wt Readings from Last 3 Encounters:  05/19/23 (!) 310 lb (140.6 kg)  05/05/23 (!) 308 lb 3.2 oz (139.8 kg)  04/12/23 (!) 306 lb (138.8 kg)      Physical Exam Vitals and nursing note reviewed.  Constitutional:      General: She is awake. She is not in acute distress.    Appearance: She is well-developed and well-groomed. She is obese. She is not ill-appearing or toxic-appearing.  HENT:     Head: Normocephalic.     Right Ear: Hearing and external ear normal.     Left Ear: Hearing and external ear normal.  Eyes:     General: Lids are normal.        Right eye: No discharge.  Left eye: No discharge.     Conjunctiva/sclera: Conjunctivae normal.     Pupils: Pupils are equal, round, and reactive to light.  Neck:     Thyroid: No thyromegaly.     Vascular: No carotid bruit.  Cardiovascular:     Rate and Rhythm: Normal rate and regular rhythm.     Heart sounds: Normal heart sounds. No murmur heard.    No gallop.  Pulmonary:     Effort: Pulmonary effort is normal. No accessory muscle usage or respiratory distress.     Breath sounds: Normal breath sounds.  Abdominal:     General: Bowel sounds are normal. There is no distension.     Palpations: Abdomen is soft.     Tenderness: There is no abdominal tenderness.  Musculoskeletal:     Cervical back: Normal range of motion and neck supple.     Right knee:  Normal.     Left knee: Crepitus present. No swelling, erythema or bony tenderness. Decreased range of motion. Tenderness present over the lateral joint line. Normal alignment and normal meniscus. Normal pulse.     Instability Tests: Anterior drawer test negative. Posterior drawer test negative. Medial McMurray test negative and lateral McMurray test negative.     Right lower leg: No edema.     Left lower leg: No edema.  Lymphadenopathy:     Cervical: No cervical adenopathy.  Skin:    General: Skin is warm and dry.  Neurological:     Mental Status: She is alert and oriented to person, place, and time.     Deep Tendon Reflexes: Reflexes are normal and symmetric.     Reflex Scores:      Brachioradialis reflexes are 2+ on the right side and 2+ on the left side.      Patellar reflexes are 2+ on the right side and 2+ on the left side. Psychiatric:        Attention and Perception: Attention normal.        Mood and Affect: Mood normal.        Speech: Speech normal.        Behavior: Behavior normal. Behavior is cooperative.        Thought Content: Thought content normal.     No results found for any visits on 05/19/23.      Assessment & Plan:   Problem List Items Addressed This Visit       Other   Chronic pain of left knee    Ongoing for several months with worsening.  Will place referral to ortho for further assessment as has history of knee replacement.  Would benefit modest weight loss, we will work on coverage for [employer].  Recommend use of knee sleeve.  Continue Meloxicam daily + trial Voltaren gel to knee.  May take Tylenol as needed up to 4000 MG a day.      Relevant Orders   Ambulatory referral to Orthopedic Surgery   Obesity - Primary    BMI 45.78 with underlying prediabetes and RA.  Would benefit modest weight loss which we discussed today.  No family history of thyroid cancer (MTC, MEN 2, thyroid cell tumors) or pancreatitis.   We will work on starting Wegovy, educated her  at length on this medication and use + side effects.  Recommended eating smaller high protein, low fat meals more frequently and exercising 30 mins a day 5 times a week with a goal of 10-15lb weight loss in the next 3 months. Patient voiced  their understanding and motivation to adhere to these recommendations.       Relevant Medications   Semaglutide-Weight Management (WEGOVY) 0.25 MG/0.5ML SOAJ   Semaglutide-Weight Management (WEGOVY) 0.5 MG/0.5ML SOAJ (Start on 06/17/2023)    Meds ordered this encounter  Medications   Semaglutide-Weight Management (WEGOVY) 0.25 MG/0.5ML SOAJ    Sig: Inject 0.25 mg into the skin once a week.    Dispense:  2 mL    Refill:  0   Semaglutide-Weight Management (WEGOVY) 0.5 MG/0.5ML SOAJ    Sig: Inject 0.5 mg into the skin once a week.    Dispense:  2 mL    Refill:  0    Return for as scheduled December 26th.  Marjie Skiff, NP

## 2023-05-19 NOTE — Assessment & Plan Note (Addendum)
Ongoing for several months with worsening.  Will place referral to ortho for further assessment as has history of knee replacement.  Would benefit modest weight loss, we will work on coverage for [employer].  Recommend use of knee sleeve.  Continue Meloxicam daily + trial Voltaren gel to knee.  May take Tylenol as needed up to 4000 MG a day.

## 2023-05-20 NOTE — Telephone Encounter (Signed)
Pt called in about status of PA on wegovy. I let her know this is being worked on and she will be called back

## 2023-05-23 NOTE — Telephone Encounter (Signed)
PA for Wegovy has been denied.

## 2023-05-23 NOTE — Telephone Encounter (Signed)
Pt called in wanting to know why the [facility] was denied

## 2023-05-24 NOTE — Telephone Encounter (Signed)
Patient has been notified to call medicaid

## 2023-06-02 ENCOUNTER — Other Ambulatory Visit: Payer: Self-pay | Admitting: Student in an Organized Health Care Education/Training Program

## 2023-06-02 DIAGNOSIS — G5702 Lesion of sciatic nerve, left lower limb: Secondary | ICD-10-CM

## 2023-06-02 DIAGNOSIS — M461 Sacroiliitis, not elsewhere classified: Secondary | ICD-10-CM

## 2023-06-02 DIAGNOSIS — G894 Chronic pain syndrome: Secondary | ICD-10-CM

## 2023-06-02 DIAGNOSIS — M533 Sacrococcygeal disorders, not elsewhere classified: Secondary | ICD-10-CM

## 2023-06-02 DIAGNOSIS — G5701 Lesion of sciatic nerve, right lower limb: Secondary | ICD-10-CM

## 2023-06-06 ENCOUNTER — Encounter: Payer: Self-pay | Admitting: Nurse Practitioner

## 2023-06-06 ENCOUNTER — Telehealth: Payer: Self-pay | Admitting: Nurse Practitioner

## 2023-06-06 NOTE — Telephone Encounter (Signed)
Pt is calling in because her insurance denied coverage for Semaglutide-Weight Management [facility] 0.5 MG/0.5ML SOAJ [[id number]] , and was told by the insurance company that [place] would need to put in an appeal for the medication. Please advise.

## 2023-06-06 NOTE — Telephone Encounter (Signed)
Patient called and states that her insurance denied her for wegovy and she has not heard anything back and would like a return call in regards to [facility] and getting approved, if anything can be done. Asked patient has she tried contacting Medicaid as the previous message states, patient stated no, she did not have a number for Medicaid, she states they never sent her a card. I gave patient Medicaid Member services line phone # and advised her to contact them for sending her an updated insurance card and to speak to them about [facility]. Please follow back up with the patient @ #   [phone].

## 2023-06-06 NOTE — Telephone Encounter (Signed)
See other phone encounter.  

## 2023-06-07 ENCOUNTER — Other Ambulatory Visit: Payer: Self-pay

## 2023-06-07 ENCOUNTER — Telehealth: Payer: Self-pay | Admitting: Student in an Organized Health Care Education/Training Program

## 2023-06-07 MED ORDER — PREGABALIN 150 MG PO CAPS: 150.0000 mg | ORAL_CAPSULE | Freq: Every day | ORAL | 2 refills | Status: DC

## 2023-06-07 NOTE — Telephone Encounter (Signed)
PT called stated that she wasn't able to get pregabalin prescription due to her inurance had expired, patient wants to know if Cherylann Ratel will send prescription in now that she has her insurance. Please give patient a call. TY

## 2023-06-07 NOTE — Telephone Encounter (Signed)
Refill request sent to MD.

## 2023-06-13 NOTE — Telephone Encounter (Signed)
Appeal letter and information faxed to the appeals department.

## 2023-06-14 ENCOUNTER — Telehealth: Payer: Self-pay | Admitting: Nurse Practitioner

## 2023-06-14 NOTE — Telephone Encounter (Unsigned)
Copied from CRM [id number]. Topic: General - Other >> Jun 14, 2023 11:46 AM Turkey B wrote: Reason for CRM: pt called in checking status of Wegovy, I let her know appeal was sent yesterday. Please cb when decision is made from appeal

## 2023-06-21 ENCOUNTER — Telehealth: Payer: Self-pay | Admitting: Nurse Practitioner

## 2023-06-21 NOTE — Telephone Encounter (Signed)
Demerith from [facility] called stated Semaglutide-Weight Management (WEGOVY) 0.5 MG/0.5ML SOAJ needs prior auth sent to [phone].

## 2023-07-04 NOTE — Telephone Encounter (Signed)
Initiated and submitted a new PA for the [facility]. Key: [id number]

## 2023-07-04 NOTE — Telephone Encounter (Signed)
Pt calling to f/u on her medication Reginal Lutes stated she has called [facility] and was advised that her insurance is not paying for the medication, but insurance has told her everything is good on their part.  Wanting to follow up on pending PA.  Please advise.

## 2023-07-07 NOTE — Telephone Encounter (Signed)
Contacted [employer] as appeal for this medication was sent last month. Representative I spoke with states that the appeal was received on 06/13/23 and they take 30 days to process. States we should know something hopefully next week around the 11th and would receive a fax with the determination. Appeal ID: [id number]   Called and notified patient of the above information.

## 2023-07-11 DIAGNOSIS — R0602 Shortness of breath: Secondary | ICD-10-CM | POA: Diagnosis not present

## 2023-07-13 LAB — HM DIABETES EYE EXAM

## 2023-07-18 ENCOUNTER — Encounter: Payer: Self-pay | Admitting: Nurse Practitioner

## 2023-07-19 NOTE — Telephone Encounter (Signed)
Patient aware that I will work with provider for this approval. May need to wait till 12/26 appointment and updated weight but will speak to patient once I can talk to provider.

## 2023-07-19 NOTE — Telephone Encounter (Signed)
Pt called the office asking if provider has heard anything back on the PA for the [facility].

## 2023-07-23 NOTE — Patient Instructions (Signed)

## 2023-07-28 ENCOUNTER — Encounter: Payer: Self-pay | Admitting: Nurse Practitioner

## 2023-07-28 ENCOUNTER — Ambulatory Visit (INDEPENDENT_AMBULATORY_CARE_PROVIDER_SITE_OTHER): Payer: Medicaid Other | Admitting: Nurse Practitioner

## 2023-07-28 VITALS — BP 124/76 | HR 88 | Temp 98.0°F | Ht 69.0 in | Wt 317.0 lb

## 2023-07-28 DIAGNOSIS — G4733 Obstructive sleep apnea (adult) (pediatric): Secondary | ICD-10-CM

## 2023-07-28 DIAGNOSIS — R7989 Other specified abnormal findings of blood chemistry: Secondary | ICD-10-CM | POA: Diagnosis not present

## 2023-07-28 DIAGNOSIS — E66813 Obesity, class 3: Secondary | ICD-10-CM | POA: Diagnosis not present

## 2023-07-28 DIAGNOSIS — R7303 Prediabetes: Secondary | ICD-10-CM

## 2023-07-28 DIAGNOSIS — E559 Vitamin D deficiency, unspecified: Secondary | ICD-10-CM

## 2023-07-28 DIAGNOSIS — Z6841 Body Mass Index (BMI) 40.0 and over, adult: Secondary | ICD-10-CM | POA: Diagnosis not present

## 2023-07-28 DIAGNOSIS — E7800 Pure hypercholesterolemia, unspecified: Secondary | ICD-10-CM | POA: Diagnosis not present

## 2023-07-28 DIAGNOSIS — J0110 Acute frontal sinusitis, unspecified: Secondary | ICD-10-CM | POA: Insufficient documentation

## 2023-07-28 DIAGNOSIS — G894 Chronic pain syndrome: Secondary | ICD-10-CM

## 2023-07-28 DIAGNOSIS — I10 Essential (primary) hypertension: Secondary | ICD-10-CM

## 2023-07-28 DIAGNOSIS — M0579 Rheumatoid arthritis with rheumatoid factor of multiple sites without organ or systems involvement: Secondary | ICD-10-CM | POA: Diagnosis not present

## 2023-07-28 DIAGNOSIS — B9689 Other specified bacterial agents as the cause of diseases classified elsewhere: Secondary | ICD-10-CM

## 2023-07-28 DIAGNOSIS — F321 Major depressive disorder, single episode, moderate: Secondary | ICD-10-CM

## 2023-07-28 DIAGNOSIS — E538 Deficiency of other specified B group vitamins: Secondary | ICD-10-CM | POA: Diagnosis not present

## 2023-07-28 LAB — BAYER DCA HB A1C WAIVED: HB A1C (BAYER DCA - WAIVED): 6.1 % — ABNORMAL HIGH (ref 4.8–5.6)

## 2023-07-28 MED ORDER — WEGOVY 0.25 MG/0.5ML ~~LOC~~ SOAJ: 0.2500 mg | SUBCUTANEOUS | 0 refills | Status: DC

## 2023-07-28 MED ORDER — WEGOVY 0.5 MG/0.5ML ~~LOC~~ SOAJ: 0.5000 mg | SUBCUTANEOUS | 0 refills | Status: DC

## 2023-07-28 MED ORDER — FLUCONAZOLE 150 MG PO TABS: 150.0000 mg | ORAL_TABLET | Freq: Once | ORAL | 0 refills | Status: AC

## 2023-07-28 MED ORDER — AMOXICILLIN-POT CLAVULANATE 875-125 MG PO TABS: 1.0000 | ORAL_TABLET | Freq: Two times a day (BID) | ORAL | 0 refills | Status: AC

## 2023-07-28 NOTE — Assessment & Plan Note (Signed)
Ongoing and recurrent issues.  Will send to GYN for further recommendations.

## 2023-07-28 NOTE — Assessment & Plan Note (Signed)
Noted on past labs, recheck TSH and Free T4 today -- consider endo referral if ongoing lows.

## 2023-07-28 NOTE — Assessment & Plan Note (Signed)
Acute for about 9 days.  No improvement and lingering symptoms.  Sent in Augmentin BID and Diflucan if needed while taking abx.  Educated patient on this.  Recommend: - Increased rest - Increasing Fluids - Acetaminophen as needed for fever/pain.  - Salt water gargling, chloraseptic spray and throat lozenges - OTC Coricidin - Mucinex.  - Saline sinus flushes or a neti pot.  - Humidifying the air.

## 2023-07-28 NOTE — Assessment & Plan Note (Signed)
Ongoing.  Continue supplement as ordered.  Recheck today.

## 2023-07-28 NOTE — Assessment & Plan Note (Signed)
Chronic, ongoing, followed by pain management.  Continue this collaboration and current treatment regimen as ordered by them.  Recent notes reviewed.

## 2023-07-28 NOTE — Assessment & Plan Note (Signed)
A1c 6.1% today, remaining similar to previous.  Continue diet monitoring and initiate medication as needed if A1c 6.5% or greater.

## 2023-07-28 NOTE — Assessment & Plan Note (Signed)
Chronic, ongoing, followed by rheumatology and pain clinic.  Continue these collaborations, recent notes reviewed.  Labs today: CMP.

## 2023-07-28 NOTE — Progress Notes (Signed)
BP 124/76   Pulse 88   Temp 98 F (36.7 C) (Oral)   Ht 5\' 9"  (1.753 m)   Wt (!) 317 lb (143.8 kg)   SpO2 96%   BMI 46.81 kg/m    Subjective:    Patient ID: Erin Contreras, female    DOB: September 18, 1960, 62 y.o.   MRN: [id number]  HPI: Erin Contreras is a 62 y.o. female  Chief Complaint  Patient presents with   Gastroesophageal Reflux   Hyperlipidemia   Hypertension   Depression   Pain Management   RHEUMATOID ARTHRITIS AND CHRONIC PAIN Currently at [facility] with rheumatology, last visit 04/26/23.  Getting Orencia infusions, last on 07/20/23. Continues Actemra and Methotrexate. Taking Meloxicam for acute pain. Continues to take B12 and vitamin D daily for low levels.  Last saw pain clinic for back injection on 10/12/22 Pain control status: stable Duration: years Location: back, arms, elbows, wrists Quality: dull, aching, and throbbing Current Pain Level: 2/10 Previous Pain Level: 8/10 Breakthrough pain: no Benefit from narcotic medications: unknown What Activities task can be accomplished with current medication? Occasional ADLs Interested in weaning off narcotics:no   Stool softners/OTC fiber: no  Previous pain specialty evaluation: yes Non-narcotic analgesic meds: yes Narcotic contract: yes   Impaired Fasting Glucose HbA1C:  Lab Results  Component Value Date   HGBA1C 6.1 (H) 07/28/2023  Duration of elevated blood sugar:  Polydipsia: no Polyuria: no Weight change: no Visual disturbance: no Glucose Monitoring: no    Accucheck frequency: Not Checking    Fasting glucose:     Post prandial:  Diabetic Education: Not Completed Family history of diabetes: yes   HYPERTENSION / HYPERLIPIDEMIA Currently on no medications. Echo on 06/09/22 and this was reassuring with EF 60-65%.  Has not seen cardiology since 2023.    Does want to lose weight and has tried for 6 months with change in diet and regular activity.  No benefit present. Satisfied with current treatment?  yes Duration of hypertension: chronic Duration of hyperlipidemia: chronic Aspirin: no Recent stressors: no Recurrent headaches: no Visual changes: no Palpitations: no Dyspnea: no Chest pain: no Lower extremity edema: no Dizzy/lightheaded: no  The 10-year ASCVD risk score (Arnett DK, et al., 2019) is: 5.5%   Values used to calculate the score:     Age: 62 years     Sex: Female     Is Non-Hispanic African American: Yes     Diabetic: No     Tobacco smoker: No     Systolic Blood Pressure: 124 mmHg     Is BP treated: No     HDL Cholesterol: 61 mg/dL     Total Cholesterol: 184 mg/dL  Clinical coverage for weight loss GLP's  Medication being dispensed is Wegovy 3 mL/28 day. Titration doses are 2 mL/28 days.   []  Product being prescribed is FDA approved for the indication, age, weight (if applicable) and not does not exceed dosing limits per the Prescribing Information per the clinical conditions for use.  []  Patient's baseline weight measured within the last 45 days as required by provider before dispensing.  []  Patient is new to therapy and One of the following:  [x]  The beneficiary is 63 years of age or over and has ONE of the following:  [x]  A BMI greater than or equal to 30 kg/m2  []  A BMI greater than or equal to 27 kg/m2 with at least one weight-related comorbidity/risk factor/complication (i.e. hypertension, type 2 diabetes, obstructive sleep apnea, cardiovascular  disease, dyslipidemia)  If patient has one weight-related comorbidity/risk factor/complication (i.e. hypertension, type 2 diabetes, obstructive sleep apnea, cardiovascular disease, dyslipidemia), please list hypertension,hyperlipidemia, rheumatoid arthritis.  Patient suffers from weight-related comorbidity/risk factor/complication hypertension,hyperlipidemia, rheumatoid arthritis.  []  The beneficiary is 42 years of age or older with a BMI greater than or equal to 27 kg/m2 AND has established cardiovascular disease  (CVD) defined as having a history of myocardial infarction, stroke, or symptomatic peripheral disease, to be documented on the PA form. AND  [x]  The beneficiary is currently on and will continue lifestyle modification including structured nutrition and physical activity, unless physical activity is not clinically appropriate at the time GLP1 therapy commences AND  [x]  The beneficiary will NOT be using the requested agent in combination with another GLP-1 receptor agonist agent AND  [x]  The beneficiary does NOT have any FDA-labeled contraindications to the requested agent, including pregnancy, lactation, history of medullary thyroid cancer or multiple endocrine neoplasia type II.   Last height recorded: 5'9"  Last weight recorded: 317 lbs  Last BMI recorded: 46.81   UPPER RESPIRATORY TRACT INFECTION Has been feeling bad for about 8-9 days. Started with upper respiratory symptoms and now coughing, will not stop. Fever: no Cough: yes Shortness of breath: no Wheezing: no Chest pain: no Chest tightness: yes Chest congestion: yes Nasal congestion: yes Runny nose: yes Post nasal drip: yes Sneezing: yes Sore throat: yes Swollen glands: no Sinus pressure: yes Headache: no Face pain: yes Toothache: yes Ear pain: none Ear pressure: yes bilateral Eyes red/itching:no Eye drainage/crusting: no  Vomiting: yes Rash: no Fatigue: yes Sick contacts: no Strep contacts: no  Context: fluctuating Recurrent sinusitis: no Relief with OTC cold/cough medications: no  Treatments attempted: none    DEPRESSION Continues on Duloxetine and Lyrica.  Reports ongoing issues with BV infections, has been treated multiple times. infections.  To stop this in September. Mood status: stable Satisfied with current treatment?: yes Symptom severity: moderate  Duration of current treatment : chronic Side effects: no Medication compliance: good compliance Psychotherapy/counseling: none Depressed mood:  yes Anxious mood: no Anhedonia: no Significant weight loss or gain: no Insomnia: yes hard to fall asleep Fatigue: sometimes Feelings of worthlessness or guilt: no Impaired concentration/indecisiveness: no Suicidal ideations: no Hopelessness: no Crying spells: no    07/28/2023    8:45 AM 05/19/2023    8:08 AM 05/05/2023    9:12 AM 01/25/2023   11:12 AM 09/16/2022   11:02 AM  Depression screen PHQ 2/9  Decreased Interest 0 0 0 0 0  Down, Depressed, Hopeless 1 0 0 0 0  PHQ - 2 Score 1 0 0 0 0  Altered sleeping 1 3 3 1  0  Tired, decreased energy 1 3 1  0 3  Change in appetite 0 0 0 0 0  Feeling bad or failure about yourself  0 0 0 0 0  Trouble concentrating 0 0 0 0 0  Moving slowly or fidgety/restless 0 0 0 0 0  Suicidal thoughts 0 0 0 0 0  PHQ-9 Score 3 6 4 1 3   Difficult doing work/chores Somewhat difficult Somewhat difficult Not difficult at all Not difficult at all        07/28/2023    8:45 AM 05/19/2023    8:08 AM 05/05/2023    9:12 AM 01/25/2023   11:12 AM  GAD 7 : Generalized Anxiety Score  Nervous, Anxious, on Edge 0 0 0 0  Control/stop worrying 0 0 0 1  Worry too much -  different things 0 1 0 1  Trouble relaxing 0 0 0 0  Restless 0 0 0 0  Easily annoyed or irritable 0 0 0 0  Afraid - awful might happen 0 0 0 0  Total GAD 7 Score 0 1 0 2  Anxiety Difficulty Not difficult at all Not difficult at all Not difficult at all Not difficult at all    Relevant past medical, surgical, family and social history reviewed and updated as indicated. Interim medical history since our last visit reviewed. Allergies and medications reviewed and updated.  Review of Systems  Constitutional:  Positive for fatigue. Negative for activity change, appetite change, diaphoresis and unexpected weight change.  HENT:  Positive for congestion, postnasal drip, rhinorrhea, sinus pressure and sore throat. Negative for ear discharge, ear pain, sinus pain, sneezing and voice change.   Respiratory:   Positive for cough. Negative for chest tightness, shortness of breath and wheezing.   Cardiovascular: Negative.   Gastrointestinal:  Positive for nausea.  Genitourinary:  Positive for vaginal discharge.  Musculoskeletal:  Positive for arthralgias.  Neurological: Negative.   Psychiatric/Behavioral: Negative.     Per HPI unless specifically indicated above     Objective:    BP 124/76   Pulse 88   Temp 98 F (36.7 C) (Oral)   Ht 5\' 9"  (1.753 m)   Wt (!) 317 lb (143.8 kg)   SpO2 96%   BMI 46.81 kg/m   Wt Readings from Last 3 Encounters:  07/28/23 (!) 317 lb (143.8 kg)  05/19/23 (!) 310 lb (140.6 kg)  05/05/23 (!) 308 lb 3.2 oz (139.8 kg)    Physical Exam Vitals and nursing note reviewed.  Constitutional:      General: She is awake. She is not in acute distress.    Appearance: She is well-developed and well-groomed. She is obese. She is not ill-appearing.  HENT:     Head: Normocephalic.     Right Ear: Hearing, ear canal and external ear normal. A middle ear effusion is present. There is no impacted cerumen. Tympanic membrane is not injected.     Left Ear: Hearing, ear canal and external ear normal. A middle ear effusion is present. There is no impacted cerumen. Tympanic membrane is not injected.     Nose: Rhinorrhea present. Rhinorrhea is clear.     Right Sinus: Frontal sinus tenderness present. No maxillary sinus tenderness.     Left Sinus: Frontal sinus tenderness present. No maxillary sinus tenderness.     Mouth/Throat:     Mouth: Mucous membranes are moist.     Pharynx: Posterior oropharyngeal erythema (mild) and postnasal drip present. No pharyngeal swelling or oropharyngeal exudate.  Eyes:     General: Lids are normal.        Right eye: No discharge.        Left eye: No discharge.     Conjunctiva/sclera: Conjunctivae normal.     Pupils: Pupils are equal, round, and reactive to light.  Neck:     Vascular: No carotid bruit.  Cardiovascular:     Rate and Rhythm:  Normal rate and regular rhythm.     Heart sounds: Normal heart sounds. No murmur heard.    No gallop.  Pulmonary:     Effort: Pulmonary effort is normal. No accessory muscle usage or respiratory distress.     Breath sounds: Wheezing present. No decreased breath sounds or rhonchi.     Comments: Intermittent expiratory wheezes throughout. Abdominal:     General: Bowel  sounds are normal. There is no distension.     Palpations: Abdomen is soft.     Tenderness: There is no abdominal tenderness.  Musculoskeletal:     Cervical back: Normal range of motion and neck supple.     Right lower leg: No edema.     Left lower leg: No edema.  Lymphadenopathy:     Head:     Right side of head: No submental, submandibular, tonsillar, preauricular, posterior auricular or occipital adenopathy.     Left side of head: No submental, submandibular, tonsillar, preauricular, posterior auricular or occipital adenopathy.  Skin:    General: Skin is warm and dry.  Neurological:     Mental Status: She is alert and oriented to person, place, and time.  Psychiatric:        Attention and Perception: Attention normal.        Mood and Affect: Mood normal.        Speech: Speech normal.        Behavior: Behavior normal. Behavior is cooperative.        Thought Content: Thought content normal.    Results for orders placed or performed in visit on 07/28/23  Bayer DCA Hb A1c Waived   Collection Time: 07/28/23  8:49 AM  Result Value Ref Range   HB A1C (BAYER DCA - WAIVED) 6.1 (H) 4.8 - 5.6 %      Assessment & Plan:   Problem List Items Addressed This Visit       Cardiovascular and Mediastinum   Benign hypertension (Chronic)   Chronic, stable without medication.  Recommend she monitor BP at least a few mornings a week at home and document.  DASH diet at home.  Continue current diet focus.  Labs today: CMP.      Relevant Orders   Comprehensive metabolic panel     Respiratory   Acute non-recurrent frontal  sinusitis   Acute for about 9 days.  No improvement and lingering symptoms.  Sent in Augmentin BID and Diflucan if needed while taking abx.  Educated patient on this.  Recommend: - Increased rest - Increasing Fluids - Acetaminophen as needed for fever/pain.  - Salt water gargling, chloraseptic spray and throat lozenges - OTC Coricidin - Mucinex.  - Saline sinus flushes or a neti pot.  - Humidifying the air.       Relevant Medications   amoxicillin-clavulanate (AUGMENTIN) 875-125 MG tablet   fluconazole (DIFLUCAN) 150 MG tablet     Musculoskeletal and Integument   Rheumatoid arthritis involving multiple sites with positive rheumatoid factor (HCC) - Primary (Chronic)   Chronic, ongoing, followed by rheumatology and pain clinic.  Continue these collaborations, recent notes reviewed.  Labs today: CMP.      Relevant Orders   Comprehensive metabolic panel     Genitourinary   Bacterial vaginal infection   Ongoing and recurrent issues.  Will send to GYN for further recommendations.      Relevant Medications   fluconazole (DIFLUCAN) 150 MG tablet   Other Relevant Orders   Ambulatory referral to Gynecology     Other   Prediabetes (Chronic)   A1c 6.1% today, remaining similar to previous.  Continue diet monitoring and initiate medication as needed if A1c 6.5% or greater.      Relevant Orders   Bayer DCA Hb A1c Waived (Completed)   B12 deficiency   Chronic, ongoing.  At this time recommend she continue supplement daily 1000 MCG daily.  Educated her on this and benefit.  Recheck level today.      Relevant Orders   Vitamin B12   Chronic pain syndrome   Chronic, ongoing, followed by pain management.  Continue this collaboration and current treatment regimen as ordered by them.  Recent notes reviewed.        Depression, major, single episode, moderate (HCC)   Ongoing, continue Duloxetine which benefits both mood and pain.  Denies SI/HI.  Discussed addition of therapy or Wellbutrin,  at this time wishes to continue current regimen and will adjust as needed.        Elevated low density lipoprotein (LDL) cholesterol level   Chronic, ongoing -- noted on labs, ASCVD 5.5% and last LDL <190.  Continue diet focus at home. Lipid panel today.      Relevant Orders   Comprehensive metabolic panel   Lipid Panel w/o Chol/HDL Ratio   Low serum thyroid stimulating hormone (TSH)   Noted on past labs, recheck TSH and Free T4 today -- consider endo referral if ongoing lows.      Relevant Orders   T4, free   TSH   Obesity   BMI 46.81 with underlying prediabetes, HTN/HLD, and RA.  Would benefit modest weight loss which we discussed today.  No family history of thyroid cancer (MTC, MEN 2, thyroid cell tumors) or pancreatitis.   We will work on starting Wegovy, educated her at length on this medication and use + side effects.  Recommended eating smaller high protein, low fat meals more frequently and exercising 30 mins a day 5 times a week with a goal of 10-15lb weight loss in the next 3 months. Patient voiced their understanding and motivation to adhere to these recommendations.       Relevant Medications   Semaglutide-Weight Management (WEGOVY) 0.5 MG/0.5ML SOAJ (Start on 08/25/2023)   Semaglutide-Weight Management (WEGOVY) 0.25 MG/0.5ML SOAJ   Vitamin D deficiency   Ongoing.  Continue supplement as ordered.  Recheck today.      Relevant Orders   VITAMIN D 25 Hydroxy (Vit-D Deficiency, Fractures)     Follow up plan: Return in about 6 months (around 01/26/2024) for HTN/HLD, PREDIABETES, OBESITY.

## 2023-07-28 NOTE — Assessment & Plan Note (Signed)
Ongoing, continue Duloxetine which benefits both mood and pain.  Denies SI/HI.  Discussed addition of therapy or Wellbutrin, at this time wishes to continue current regimen and will adjust as needed.

## 2023-07-28 NOTE — Assessment & Plan Note (Signed)
BMI 46.81 with underlying prediabetes, HTN/HLD, and RA.  Would benefit modest weight loss which we discussed today.  No family history of thyroid cancer (MTC, MEN 2, thyroid cell tumors) or pancreatitis.   We will work on starting Wegovy, educated her at length on this medication and use + side effects.  Recommended eating smaller high protein, low fat meals more frequently and exercising 30 mins a day 5 times a week with a goal of 10-15lb weight loss in the next 3 months. Patient voiced their understanding and motivation to adhere to these recommendations.

## 2023-07-28 NOTE — Assessment & Plan Note (Signed)
Chronic, stable without medication.  Recommend she monitor BP at least a few mornings a week at home and document.  DASH diet at home.  Continue current diet focus.  Labs today: CMP.

## 2023-07-28 NOTE — Assessment & Plan Note (Signed)
Chronic, ongoing -- noted on labs, ASCVD 5.5% and last LDL <190.  Continue diet focus at home. Lipid panel today.

## 2023-07-28 NOTE — Assessment & Plan Note (Signed)
Chronic, ongoing.  At this time recommend she continue supplement daily 1000 MCG daily.  Educated her on this and benefit. Recheck level today.

## 2023-07-29 LAB — COMPREHENSIVE METABOLIC PANEL
ALT: 17 [IU]/L (ref 0–32)
AST: 17 [IU]/L (ref 0–40)
Albumin: 3.6 g/dL — ABNORMAL LOW (ref 3.9–4.9)
Alkaline Phosphatase: 120 [IU]/L (ref 44–121)
BUN/Creatinine Ratio: 20 (ref 12–28)
BUN: 15 mg/dL (ref 8–27)
Bilirubin Total: 0.3 mg/dL (ref 0.0–1.2)
CO2: 24 mmol/L (ref 20–29)
Calcium: 9 mg/dL (ref 8.7–10.3)
Chloride: 106 mmol/L (ref 96–106)
Creatinine, Ser: 0.74 mg/dL (ref 0.57–1.00)
Globulin, Total: 2.8 g/dL (ref 1.5–4.5)
Glucose: 97 mg/dL (ref 70–99)
Potassium: 4.2 mmol/L (ref 3.5–5.2)
Sodium: 142 mmol/L (ref 134–144)
Total Protein: 6.4 g/dL (ref 6.0–8.5)
eGFR: 91 mL/min/{1.73_m2} (ref >59)

## 2023-07-29 LAB — LIPID PANEL W/O CHOL/HDL RATIO
Cholesterol, Total: 186 mg/dL (ref 100–199)
HDL: 60 mg/dL (ref >39)
LDL Chol Calc (NIH): 108 mg/dL — ABNORMAL HIGH (ref 0–99)
Triglycerides: 100 mg/dL (ref 0–149)
VLDL Cholesterol Cal: 18 mg/dL (ref 5–40)

## 2023-07-29 LAB — T4, FREE: Free T4: 1.15 ng/dL (ref 0.82–1.77)

## 2023-07-29 LAB — TSH: TSH: 1.15 u[IU]/mL (ref 0.450–4.500)

## 2023-07-29 LAB — VITAMIN B12: Vitamin B-12: 351 pg/mL (ref 232–1245)

## 2023-07-29 LAB — VITAMIN D 25 HYDROXY (VIT D DEFICIENCY, FRACTURES): Vit D, 25-Hydroxy: 35 ng/mL (ref 30.0–100.0)

## 2023-07-29 NOTE — Progress Notes (Signed)
Good morning crew, please let Erin Contreras know her labs have returned: - Kidney function, creatinine and eGFR, remains normal, as is liver function, AST and ALT.  - LDL, bad cholesterol, remains elevated.  Do not need to start medication at this time, but hopefully insurance will cover [facility] which can help lower weight and may help levels. - Remainder of labs stable, with exception of Vitamin B12 level which remains on lower end of normal.  I do recommend she take Vitamin B12 1000 MCG daily for nervous system health. Any questions? Keep being amazing!!  Thank you for allowing me to participate in your care.  I appreciate you. Kindest regards, Shamecka Hocutt

## 2023-08-04 LAB — HM DIABETES EYE EXAM

## 2023-08-09 ENCOUNTER — Telehealth: Payer: Self-pay | Admitting: Nurse Practitioner

## 2023-08-09 DIAGNOSIS — H5213 Myopia, bilateral: Secondary | ICD-10-CM | POA: Diagnosis not present

## 2023-08-09 NOTE — Telephone Encounter (Signed)
 Medication Refill -  Most Recent Primary Care Visit:  Provider: CANNADY, JOLENE T  Department: [facility]  Visit Type: OFFICE VISIT  Date: 07/28/2023  Medication: Semaglutide -Weight Management (WEGOVY ) 0.5 MG/0.5ML SOAJ  Pt stated she received a message from the pharmacy that her medication, Semaglutide -Weight Management (WEGOVY ) 0.5 MG/0.5ML SOAJ Was approved by her insurance to contact her PCP because they have not received Rx.  Please advise.   Has the patient contacted their pharmacy? Yes  (Agent: If yes, when and what did the pharmacy advise?)  Is this the correct pharmacy for this prescription? Yes If no, delete pharmacy and type the correct one.  This is the patient's preferred pharmacy:   [facility] [address] [place] - [address] [place] [address] Phone: [phone] Fax: [phone]    Has the prescription been filled recently? Yes  Is the patient out of the medication? Yes  Has the patient been seen for an appointment in the last year OR does the patient have an upcoming appointment? Yes  Can we respond through MyChart? No  Agent: Please be advised that Rx refills may take up to 3 business days. We ask that you follow-up with your pharmacy.

## 2023-08-09 NOTE — Telephone Encounter (Signed)
 Pharmacy reports they did not receive last refill and there is a future order on medication list.

## 2023-08-09 NOTE — Telephone Encounter (Signed)
 Contacted [facility]. Pharmacist states that the medication is still stating that a PA is required.

## 2023-08-11 NOTE — Telephone Encounter (Signed)
 Appeal for medication was submitted in November, see previous phone call documentation.   Contacted the patient's insurance. Appeal was denied and closed. Insurance if faxing over the denial letter to the office. Will give to provider once received.

## 2023-08-12 ENCOUNTER — Encounter: Payer: Self-pay | Admitting: Certified Nurse Midwife

## 2023-08-12 ENCOUNTER — Ambulatory Visit (INDEPENDENT_AMBULATORY_CARE_PROVIDER_SITE_OTHER): Payer: Medicaid Other | Admitting: Certified Nurse Midwife

## 2023-08-12 VITALS — BP 117/67 | HR 84 | Ht 68.0 in | Wt 310.0 lb

## 2023-08-12 DIAGNOSIS — B9689 Other specified bacterial agents as the cause of diseases classified elsewhere: Secondary | ICD-10-CM

## 2023-08-12 DIAGNOSIS — N760 Acute vaginitis: Secondary | ICD-10-CM | POA: Diagnosis not present

## 2023-08-12 DIAGNOSIS — N898 Other specified noninflammatory disorders of vagina: Secondary | ICD-10-CM | POA: Diagnosis not present

## 2023-08-12 DIAGNOSIS — Z7689 Persons encountering health services in other specified circumstances: Secondary | ICD-10-CM | POA: Diagnosis not present

## 2023-08-12 NOTE — Progress Notes (Signed)
 Erin Melanie DASEN, NP   Chief Complaint  Patient presents with   Establish Care   recurrent BV    HPI:      Erin Contreras is a 63 y.o. G4P0010 whose LMP was No LMP recorded. Patient is postmenopausal., presents today for multiple episodes of BV and continued vaginal irritation, odor, itching despite treatment. She was last treated with fluconzaole 12/26 & metronidazole  05/05/23. The symptoms of odor & irritation do not resolve with treatment. Wears cotton underwear, washes with Dove, does not douche. Not currently sexually active. Denies aggravating factors, medication alleviates while taking but then symptoms return.    Patient Active Problem List   Diagnosis Date Noted   Acute non-recurrent frontal sinusitis 07/28/2023   Other dysphagia 02/07/2023   Bacterial vaginal infection 10/08/2022   Elevated low density lipoprotein (LDL) cholesterol level 04/09/2022   B12 deficiency 09/16/2021   Low serum thyroid  stimulating hormone (TSH) 09/16/2021   Adrenal adenoma 03/09/2021   Hiatal hernia 03/09/2021   Total knee replacement status 01/28/2021   Neurogenic pain 12/30/2020   Depression, major, single episode, moderate (HCC) 10/17/2020   Chronic radicular lumbar pain 05/06/2020   Lumbar spondylosis 05/06/2020   Lumbar degenerative disc disease 05/06/2020   SI joint arthritis (HCC) 05/06/2020   Chronic pain syndrome 05/06/2020   Prediabetes 03/21/2019   Vitamin D  deficiency 12/30/2017   Benign hypertension 07/11/2017   Obstructive sleep apnea of adult 04/27/2017   Multiple nodules of lung 04/27/2017   Herpes simplex virus infection 03/17/2017   Chronic pain of left knee 12/02/2015   OA (osteoarthritis) of knee    Rheumatoid arthritis involving multiple sites with positive rheumatoid factor (HCC)    Obesity    Urge and stress incontinence    Gastroesophageal reflux disease with esophagitis 05/24/2014    Past Surgical History:  Procedure Laterality Date   CHOLECYSTECTOMY   2011   COLONOSCOPY N/A 03/19/2021   Procedure: COLONOSCOPY;  Surgeon: Therisa Bi, MD;  Location: [facility] ENDOSCOPY;  Service: Gastroenterology;  Laterality: N/A;   ESOPHAGOGASTRODUODENOSCOPY (EGD) WITH PROPOFOL  N/A 02/07/2023   Procedure: ESOPHAGOGASTRODUODENOSCOPY (EGD) WITH PROPOFOL ;  Surgeon: Therisa Bi, MD;  Location: [facility] ENDOSCOPY;  Service: Gastroenterology;  Laterality: N/A;   FRACTURE SURGERY Right    ankle as a child   JOINT REPLACEMENT Left 01/28/2021   KNEE ARTHROPLASTY Right 07/11/2017   Procedure: COMPUTER ASSISTED TOTAL KNEE ARTHROPLASTY;  Surgeon: Mardee Lynwood SQUIBB, MD;  Location: [facility] ORS;  Service: Orthopedics;  Laterality: Right;   KNEE ARTHROPLASTY Left 01/28/2021   Procedure: COMPUTER ASSISTED TOTAL KNEE ARTHROPLASTY;  Surgeon: Mardee Lynwood SQUIBB, MD;  Location: [facility] ORS;  Service: Orthopedics;  Laterality: Left;   SLEEVE GASTROPLASTY     TUMOR REMOVAL Right 2010   ear    Family History  Problem Relation Age of Onset   Cancer Mother        liver   Diabetes Mother    Hyperlipidemia Mother    Hypertension Mother    Cirrhosis Father        liver   Hypertension Daughter    Stroke Maternal Grandmother    Stroke Maternal Grandfather    Heart disease Paternal Grandmother    Diabetes Paternal Grandmother    Heart disease Paternal Grandfather    Diabetes Paternal Grandfather     Social History   Socioeconomic History   Marital status: Single    Spouse name: Not on file   Number of children: Not on file   Years of education:  Not on file   Highest education level: Not on file  Occupational History   Not on file  Tobacco Use   Smoking status: Never   Smokeless tobacco: Never  Vaping Use   Vaping status: Never Used  Substance and Sexual Activity   Alcohol use: No   Drug use: No   Sexual activity: Not Currently  Other Topics Concern   Not on file  Social History Narrative   Not on file   Social Drivers of Health   Financial Resource Strain: Not on file   Food Insecurity: Not on file  Transportation Needs: Not on file  Physical Activity: Not on file  Stress: Not on file  Social Connections: Not on file  Intimate Partner Violence: Not on file    Outpatient Medications Prior to Visit  Medication Sig Dispense Refill   acyclovir  (ZOVIRAX ) 400 MG tablet Take 1 tablet (400 mg total) by mouth 2 (two) times daily as needed. 180 tablet 4   Cholecalciferol  1.25 MG (50000 UT) TABS Take 1 tablet by mouth once a week. 12 tablet 4   DULoxetine  (CYMBALTA ) 20 MG capsule Take 40 mg by mouth every morning.     famotidine  (PEPCID ) 40 MG tablet Take 1 tablet (40 mg total) by mouth at bedtime. 90 tablet 3   folic acid  (FOLVITE ) 1 MG tablet Take 1 tablet by mouth daily.     hydroxychloroquine (PLAQUENIL) 200 MG tablet Take 200 mg by mouth 2 (two) times daily.     meloxicam  (MOBIC ) 15 MG tablet Take 15 mg by mouth daily.     methotrexate  (RHEUMATREX) 2.5 MG tablet Take 15 mg by mouth once a week.     pantoprazole  (PROTONIX ) 40 MG tablet Take 1 tablet (40 mg total) by mouth 2 (two) times daily. 180 tablet 3   pregabalin  (LYRICA ) 150 MG capsule Take 1 capsule (150 mg total) by mouth at bedtime. 30 capsule 2   Semaglutide -Weight Management (WEGOVY ) 0.25 MG/0.5ML SOAJ Inject 0.25 mg into the skin once a week. (Patient not taking: Reported on 08/12/2023) 2 mL 0   [START ON 08/25/2023] Semaglutide -Weight Management (WEGOVY ) 0.5 MG/0.5ML SOAJ Inject 0.5 mg into the skin once a week. (Patient not taking: Reported on 08/12/2023) 2 mL 0   Tocilizumab (ACTEMRA ACTPEN) 162 MG/0.9ML SOAJ Inject into the skin.     No facility-administered medications prior to visit.      ROS:  Review of Systems  Constitutional: Negative.   Genitourinary:  Positive for vaginal discharge and vaginal pain.     OBJECTIVE:   Vitals:  BP 117/67   Pulse 84   Ht 5' 8 (1.727 m)   Wt (!) 310 lb (140.6 kg)   BMI 47.14 kg/m   Physical Exam Constitutional:      Appearance: Normal  appearance.  Cardiovascular:     Rate and Rhythm: Normal rate.  Pulmonary:     Effort: Pulmonary effort is normal.  Genitourinary:    Comments: Labia pale pink, tissue intact. Speculum exam reveals pale pink vaginal tissue, small amount white discharge without significant odor. Wet prep with occasional clue cells but negative for yeast, negative amine test. Neurological:     General: No focal deficit present.     Mental Status: She is alert and oriented to person, place, and time.  Psychiatric:        Mood and Affect: Mood normal.        Behavior: Behavior normal.     Results: No results found for  this or any previous visit (from the past 24 hours).   Assessment/Plan: Encounter to establish care  Bacterial vaginosis - Plan: NuSwab VG Plus+Mycoplasmas,NAA  Vaginal odor - Plan: NuSwab VG Plus+Mycoplasmas,NAA  NuSwab collected & sent. Discussed use of Boric Acid given no resolution of symptoms with metronidazole . Samples & instructions on use provided. Recommend awaiting swab results prior to prescription antibiotics but would consider clindamycin given inadequate resolution with metronidazole .  No orders of the defined types were placed in this encounter.    Harlene LITTIE Cisco, CNM 08/12/2023 12:07 PM

## 2023-08-12 NOTE — Patient Instructions (Signed)
 Boric Acid Suppositories for Bacterial Vaginosis (BV)/Yeast  Boric acid suppositories can help treat BV or prevent recurrence. You can buy over the counter boric acid capsules or make your own suppositories by putting about 600 mg of boric acid in a size 0 capsule.  Boric acid should only be used vaginally, do not take by mouth.  To treat BV/yeast  Place one capsule in the vagina nightly for 7-10 days.  For prevention of BV/yeast: Take nightly for 7-10 days when symptoms start. If you usually have symptoms after an event such as the end of your period or intercourse/sexual activity you may place one capsule in your vagina for 1-3 days after the event

## 2023-08-15 LAB — NUSWAB VG PLUS+MYCOPLASMAS,NAA
Candida albicans, NAA: NEGATIVE
Candida glabrata, NAA: NEGATIVE
Chlamydia trachomatis, NAA: NEGATIVE
Mycoplasma genitalium NAA: NEGATIVE
Mycoplasma hominis NAA: NEGATIVE
Neisseria gonorrhoeae, NAA: NEGATIVE
Trich vag by NAA: NEGATIVE
Ureaplasma spp NAA: NEGATIVE

## 2023-08-18 NOTE — Telephone Encounter (Signed)
Caller states as per [facility] advised patient at first medication was approved and then stated its still pending and to check with her PCP.  Caller states she signed a  wegovy form at her last office visit. Caller would like a follow up today regarding status.

## 2023-08-18 NOTE — Telephone Encounter (Signed)
Called and notified patient of providers response

## 2023-08-18 NOTE — Telephone Encounter (Signed)
Insurance denied the appeal we sent. Is there anything else we can do for the patient?

## 2023-09-01 ENCOUNTER — Telehealth: Payer: Self-pay | Admitting: Nurse Practitioner

## 2023-09-01 NOTE — Telephone Encounter (Signed)
Copied from CRM [id number]. Topic: General - Inquiry >> Sep 01, 2023  2:31 PM Haroldine Laws wrote: Selena Batten with [facility] called saying the PA was denied for [facility] needing more information.  Third denial and may need an appeal.  CB#  [phone]

## 2023-09-02 NOTE — Telephone Encounter (Signed)
Appeal has been completed previously and denied by the patient's insurance.

## 2023-09-07 DIAGNOSIS — M19041 Primary osteoarthritis, right hand: Secondary | ICD-10-CM | POA: Diagnosis not present

## 2023-09-07 DIAGNOSIS — M059 Rheumatoid arthritis with rheumatoid factor, unspecified: Secondary | ICD-10-CM | POA: Diagnosis not present

## 2023-09-07 DIAGNOSIS — Z79631 Long term (current) use of antimetabolite agent: Secondary | ICD-10-CM | POA: Diagnosis not present

## 2023-09-07 DIAGNOSIS — M19042 Primary osteoarthritis, left hand: Secondary | ICD-10-CM | POA: Diagnosis not present

## 2023-09-08 NOTE — Telephone Encounter (Signed)
 Pt is calling back to ask if Jolene has heard anything on the appeal of wegovy . Please advise CB- [phone]

## 2023-09-09 ENCOUNTER — Telehealth: Payer: Self-pay | Admitting: Nurse Practitioner

## 2023-09-09 DIAGNOSIS — R0602 Shortness of breath: Secondary | ICD-10-CM | POA: Diagnosis not present

## 2023-09-09 NOTE — Telephone Encounter (Signed)
 See other phone message. Patient has been advised that her insurance has denied the PA and the appeal as well.

## 2023-09-09 NOTE — Telephone Encounter (Signed)
 The patient called in stating she spoke with her insurance and they told her that the Semaglutide -Weight Management (WEGOVY ) 0.25 MG/0.5ML SOAJ either needs a prior authorization or that the notes sent in do not meet the criteria in order for the insurance to cover it. Please assist patient further as she doesn't know what else to do at this point.

## 2023-09-09 NOTE — Telephone Encounter (Signed)
 Called and notified patient that the appeal for the wegovy  was denied.

## 2023-09-16 DIAGNOSIS — R0602 Shortness of breath: Secondary | ICD-10-CM | POA: Diagnosis not present

## 2023-09-20 ENCOUNTER — Ambulatory Visit: Payer: Medicaid Other | Admitting: Student in an Organized Health Care Education/Training Program

## 2023-09-25 DIAGNOSIS — J849 Interstitial pulmonary disease, unspecified: Secondary | ICD-10-CM | POA: Diagnosis not present

## 2023-09-25 DIAGNOSIS — R0602 Shortness of breath: Secondary | ICD-10-CM | POA: Diagnosis not present

## 2023-09-27 ENCOUNTER — Telehealth: Payer: Self-pay | Admitting: Nurse Practitioner

## 2023-09-27 NOTE — Telephone Encounter (Signed)
 Called patient to get more information about a question she had. Patient states that  she talked to the insurance company as to why she was denied for the [facility] and she was told that the provider is not putting in enough information about  her medical conditions and why she needs to lose weight.Please advise.

## 2023-09-27 NOTE — Telephone Encounter (Signed)
 Tried contacting patient, no answer and no VM set up. Will try to call again.   OK for E2C2 to speak to the patient if she calls back and advise her of Jolene's message.

## 2023-09-28 ENCOUNTER — Ambulatory Visit: Payer: Self-pay | Admitting: Nurse Practitioner

## 2023-09-28 NOTE — Telephone Encounter (Signed)
 See other phone encounter. Patient was advised of Jolene's message.

## 2023-09-28 NOTE — Telephone Encounter (Signed)
  Chief Complaint: Information only  Disposition: [] ED /[] Urgent Care (no appt availability in office) / [] Appointment(In office/virtual)/ []  [facility]/ [x] Home Care/ [] Refused Recommended Disposition /[] [facility]/ []  Follow-up with PCP Additional Notes: Patient returning call regarding wegovy PA, this RN relayed PCP note to patient. Patient reports insurance is advising that there is not enough information being provided on the PA. Patient verbalized understanding. Alerting PCP for review.   Copied from CRM [id number]. Topic: General - Other >> Sep 28, 2023  9:59 AM Clide Dales wrote: Patient returning call about wegovy Reason for Disposition  Health Information question, no triage required and triager able to answer question  Answer Assessment - Initial Assessment Questions 1. REASON FOR CALL or QUESTION: "What is your reason for calling today?" or "How can I best help you?" or "What question do you have that I can help answer?"     Patient returning call regarding wegovy PA  Protocols used: Information Only Call - No Triage-A-AH

## 2023-09-28 NOTE — Telephone Encounter (Signed)
 See other phone encounter.

## 2023-11-01 ENCOUNTER — Ambulatory Visit
Attending: Student in an Organized Health Care Education/Training Program | Admitting: Student in an Organized Health Care Education/Training Program

## 2023-11-01 ENCOUNTER — Encounter: Payer: Self-pay | Admitting: Student in an Organized Health Care Education/Training Program

## 2023-11-01 VITALS — BP 127/80 | HR 86 | Temp 97.3°F | Resp 16 | Ht 68.0 in | Wt 311.0 lb

## 2023-11-01 DIAGNOSIS — G5701 Lesion of sciatic nerve, right lower limb: Secondary | ICD-10-CM | POA: Insufficient documentation

## 2023-11-01 DIAGNOSIS — G5702 Lesion of sciatic nerve, left lower limb: Secondary | ICD-10-CM | POA: Diagnosis not present

## 2023-11-01 DIAGNOSIS — M461 Sacroiliitis, not elsewhere classified: Secondary | ICD-10-CM | POA: Insufficient documentation

## 2023-11-01 DIAGNOSIS — M533 Sacrococcygeal disorders, not elsewhere classified: Secondary | ICD-10-CM | POA: Insufficient documentation

## 2023-11-01 MED ORDER — PREGABALIN 150 MG PO CAPS: 150.0000 mg | ORAL_CAPSULE | Freq: Every day | ORAL | 5 refills | Status: DC

## 2023-11-01 NOTE — Patient Instructions (Signed)

## 2023-11-01 NOTE — Progress Notes (Signed)
 PROVIDER NOTE: Information contained herein reflects review and annotations entered in association with encounter. Interpretation of such information and data should be left to medically-trained personnel. Information provided to patient can be located elsewhere in the medical record under "Patient Instructions". Document created using STT-dictation technology, any transcriptional errors that may result from process are unintentional.    Patient: Erin Contreras  Service Category: E/M  Provider: Edward Jolly, MD  DOB: Aug 21, 1960  DOS: 11/01/2023  Referring Provider: Marjie Skiff, NP  MRN: [id number]  Specialty: Interventional Pain Management  PCP: Marjie Skiff, NP  Type: Established Patient  Setting: Ambulatory outpatient    Location: Office  Delivery: Face-to-face     HPI  [name], a 63 y.o. year old female, is here today because of her SI joint arthritis (HCC) [M46.1]. Ms. [name] primary complain today is Back Pain (R>L), Leg Pain (R>L), and Hip Pain (R>L)   Pain Assessment: Severity of Chronic pain is reported as a 8 /10. Location: Back Lower, Right, Left/Radaites from lower back into buttocks to hips bilateral (R>L) into outer legs bilateral down to calves bialteral (R>L). Onset: More than a month ago. Quality: Constant, Sharp. Timing: Constant. Modifying factor(s): Sitting. Vitals:  height is 5\' 8"  (1.727 m) and weight is 311 lb (141.1 kg) (abnormal). Her temporal temperature is 97.3 F (36.3 C) (abnormal). Her blood pressure is 127/80 and her pulse is 86. Her respiration is 16 and oxygen saturation is 96%.  BMI: Estimated body mass index is 47.29 kg/m as calculated from the following:   Height as of this encounter: 5\' 8"  (1.727 m).   Weight as of this encounter: 311 lb (141.1 kg). Last encounter: 04/12/2023. Last procedure: Visit date not found. Reason for encounter: Ms. [name], a 63 year old female present for her SI joint arthritis that worse more with walking.  Patient's relief from pain and inflammation with SI joint injection and Piriformis muscle injection and improve mobility. Recommend weight loss management with GLP 1 and diet modification that help to reduce joint strain with benefit from pain relief. Patient understand treatment plan.     ROS  Constitutional: Denies any fever or chills Gastrointestinal: No reported hemesis, hematochezia, vomiting, or acute GI distress Musculoskeletal: buttock pain Neurological: No reported episodes of acute onset apraxia, aphasia, dysarthria, agnosia, amnesia, paralysis, loss of coordination, or loss of consciousness  Medication Review  Cholecalciferol, DULoxetine, Tocilizumab, acyclovir, famotidine, folic acid, hydroxychloroquine, meloxicam, methotrexate, pantoprazole, and pregabalin  History Review  Allergy: Ms. [name] has no known allergies. Drug: Ms. [name]  reports no history of drug use. Alcohol:  reports no history of alcohol use. Tobacco:  reports that she has never smoked. She has never used smokeless tobacco. Social: Ms. [name]  reports that she has never smoked. She has never used smokeless tobacco. She reports that she does not drink alcohol and does not use drugs. Medical:  has a past medical history of Boils, Ear mass, GERD (gastroesophageal reflux disease), Hemorrhoids, Herpes simplex virus infection, HSV infection, Lumbago with sciatica, OA (osteoarthritis) of knee, Obesity, Overactive bladder, PONV (postoperative nausea and vomiting), Pre-diabetes, RA (rheumatoid arthritis) (HCC), Sleep apnea, and Urge and stress incontinence. Surgical: Ms. [name]  has a past surgical history that includes Cholecystectomy (2011); Tumor removal (Right, 2010); Sleeve Gastroplasty; Fracture surgery (Right); Knee Arthroplasty (Right, 07/11/2017); Knee Arthroplasty (Left, 01/28/2021); Joint replacement (Left, 01/28/2021); Colonoscopy (N/A, 03/19/2021); and Esophagogastroduodenoscopy (egd) with propofol (N/A,  02/07/2023). Family: family history includes Cancer in her mother; Cirrhosis in  her father; Diabetes in her mother, paternal grandfather, and paternal grandmother; Heart disease in her paternal grandfather and paternal grandmother; Hyperlipidemia in her mother; Hypertension in her daughter and mother; Stroke in her maternal grandfather and maternal grandmother.  Laboratory Chemistry Profile   Renal Lab Results  Component Value Date   BUN 15 07/28/2023   CREATININE 0.74 07/28/2023   BCR 20 07/28/2023   GFRAA 102 06/12/2020   GFRNONAA >60 01/05/2022    Hepatic Lab Results  Component Value Date   AST 17 07/28/2023   ALT 17 07/28/2023   ALBUMIN 3.6 (L) 07/28/2023   ALKPHOS 120 07/28/2023   LIPASE 106 05/10/2014    Electrolytes Lab Results  Component Value Date   NA 142 07/28/2023   K 4.2 07/28/2023   CL 106 07/28/2023   CALCIUM 9.0 07/28/2023   MG 2.0 10/08/2022   PHOS 3.7 09/04/2014    Bone Lab Results  Component Value Date   VD25OH 35.0 07/28/2023    Inflammation (CRP: Acute Phase) (ESR: Chronic Phase) Lab Results  Component Value Date   CRP 0.6 01/21/2021   ESRSEDRATE 43 (H) 01/21/2021         Note: Above Lab results reviewed.  Recent Imaging Review  MM 3D SCREEN BREAST BILATERAL CLINICAL DATA:  Screening.  EXAM: DIGITAL SCREENING BILATERAL MAMMOGRAM WITH TOMOSYNTHESIS AND CAD  TECHNIQUE: Bilateral screening digital craniocaudal and mediolateral oblique mammograms were obtained. Bilateral screening digital breast tomosynthesis was performed. The images were evaluated with computer-aided detection.  COMPARISON:  Previous exam(s).  ACR Breast Density Category b: There are scattered areas of fibroglandular density.  FINDINGS: There are no findings suspicious for malignancy.  IMPRESSION: No mammographic evidence of malignancy. A result letter of this screening mammogram will be mailed directly to the patient.  RECOMMENDATION: Screening mammogram in one  year. (Code:SM-B-01Y)  BI-RADS CATEGORY  1: Negative.  Electronically Signed   By: Ted Mcalpine M.D.   On: 10/11/2022 16:57 Note: Reviewed        Physical Exam  General appearance: Well nourished, well developed, and well hydrated. In no apparent acute distress Mental status: Alert, oriented x 3 (person, place, & time)       Respiratory: No evidence of acute respiratory distress Eyes: PERLA Vitals: BP 127/80 (Patient Position: Sitting, Cuff Size: Normal)   Pulse 86   Temp (!) 97.3 F (36.3 C) (Temporal)   Resp 16   Ht 5\' 8"  (1.727 m)   Wt (!) 311 lb (141.1 kg)   SpO2 96%   BMI 47.29 kg/m  BMI: Estimated body mass index is 47.29 kg/m as calculated from the following:   Height as of this encounter: 5\' 8"  (1.727 m).   Weight as of this encounter: 311 lb (141.1 kg). Ideal: Ideal body weight: 63.9 kg (140 lb 14 oz) Adjusted ideal body weight: 94.8 kg (208 lb 14.8 oz)  Si joint and buttock pain  Assessment   Diagnosis Status  1. SI joint arthritis (HCC)   2. Piriformis syndrome of left side   3. Piriformis syndrome of right side   4. Sacroiliac joint pain    Controlled Controlled Controlled   Updated Problems: No problems updated.  Plan of Care   [name] has a current medication list which includes the following long-term medication(s): duloxetine, famotidine, pantoprazole, actemra actpen, and pregabalin.   1. SI joint arthritis (HCC) (Primary) -Previous SIJ injection, provided temporarily relief, discussed repeating PRN - SACROILIAC JOINT INJECTION; Standing  2. Piriformis syndrome of  left side -Piriformis injection as needed - SACROILIAC JOINT INJECTION; Standing  3. Piriformis syndrome of right side --Piriformis injection as needed - SACROILIAC JOINT INJECTION; Standing  4. Sacroiliac joint pain - SACROILIAC JOINT INJECTION; Standing   Pharmacotherapy (Medications Ordered): Meds ordered this encounter  Medications   pregabalin  (LYRICA) 150 MG capsule    Sig: Take 1 capsule (150 mg total) by mouth at bedtime.    Dispense:  30 capsule    Refill:  5    Fill one day early if pharmacy is closed on scheduled refill date. May substitute for generic if available.   Orders:  Orders Placed This Encounter  Procedures   SACROILIAC JOINT INJECTION    Standing Status:   Standing    Number of Occurrences:   3    Next Expected Occurrence:   01/20/2024    Expiration Date:   10/31/2024    Scheduling Instructions:     Side: Bilateral SIJ and Piriformis TPI     Sedation: Patient's choice.     Timeframe: PRN-pt will call    Where will this procedure be performed?:   [facility]   Follow-up plan:   Return in about 6 months (around 05/02/2024) for Randalyn Rhea, MM.      Recent Visits No visits were found meeting these conditions. Showing recent visits within past 90 days and meeting all other requirements Today's Visits Date Type Provider Dept  11/01/23 Office Visit Edward Jolly, MD [facility]  Showing today's visits and meeting all other requirements Future Appointments No visits were found meeting these conditions. Showing future appointments within next 90 days and meeting all other requirements  I discussed the assessment and treatment plan with the patient. The patient was provided an opportunity to ask questions and all were answered. The patient agreed with the plan and demonstrated an understanding of the instructions.  Patient advised to call back or seek an in-person evaluation if the symptoms or condition worsens.  Duration of encounter: .  Total time on encounter, as per AMA guidelines included both the face-to-face and non-face-to-face time personally spent by the physician and/or other qualified health care professional(s) on the day of the encounter (includes time in activities that require the physician or other qualified health care professional and does not include time in  activities normally performed by clinical staff). Physician's time may include the following activities when performed: Preparing to see the patient (e.g., pre-charting review of records, searching for previously ordered imaging, lab work, and nerve conduction tests) Review of prior analgesic pharmacotherapies. Reviewing PMP Interpreting ordered tests (e.g., lab work, imaging, nerve conduction tests) Performing post-procedure evaluations, including interpretation of diagnostic procedures Obtaining and/or reviewing separately obtained history Performing a medically appropriate examination and/or evaluation Counseling and educating the patient/family/caregiver Ordering medications, tests, or procedures Referring and communicating with other health care professionals (when not separately reported) Documenting clinical information in the electronic or other health record Independently interpreting results (not separately reported) and communicating results to the patient/ family/caregiver Care coordination (not separately reported)  Note by: Edward Jolly, MD Date: 11/01/2023; Time: 1:19 PM

## 2023-11-01 NOTE — Progress Notes (Signed)
 Safety precautions to be maintained throughout the outpatient stay will include: orient to surroundings, keep bed in low position, maintain call bell within reach at all times, provide assistance with transfer out of bed and ambulation.

## 2023-12-03 DIAGNOSIS — M0510 Rheumatoid lung disease with rheumatoid arthritis of unspecified site: Secondary | ICD-10-CM | POA: Insufficient documentation

## 2023-12-03 NOTE — Patient Instructions (Signed)
 Be Involved in Caring For Your Health:  Taking Medications When medications are taken as directed, they can greatly improve your health. But if they are not taken as prescribed, they may not work. In some cases, not taking them correctly can be harmful. To help ensure your treatment remains effective and safe, understand your medications and how to take them. Bring your medications to each visit for review by your provider.  Your lab results, notes, and after visit summary will be available on My Chart. We strongly encourage you to use this feature. If lab results are abnormal the clinic will contact you with the appropriate steps. If the clinic does not contact you assume the results are satisfactory. You can always view your results on My Chart. If you have questions regarding your health or results, please contact the clinic during office hours. You can also ask questions on My Chart.  We at [facility] are grateful that you chose [place] to provide your care. We strive to provide evidence-based and compassionate care and are always looking for feedback. If you get a survey from the clinic please complete this so we can hear your opinions.  Prediabetes: What to Know Prediabetes is when your blood sugar, also called glucose, is at a higher level than normal but not high enough for you to be diagnosed with type 2 diabetes (type 2 diabetes mellitus). Having prediabetes puts you at risk for getting type 2 diabetes. By making some healthy changes, you may be able to prevent or delay getting type 2 diabetes. This is important because type 2 diabetes can lead to serious problems. Some of these include: Heart disease. Stroke. Blindness. Kidney disease. Depression. Poor blood flow in the feet and legs. In very bad cases, this could lead to having a leg removed by surgery (amputation). What are the causes? The exact cause of prediabetes isn't known. It may result from insulin resistance. Insulin  resistance happens when cells in the body don't respond properly to insulin that the body makes. This can cause too much sugar to build up in the blood. High blood sugar, also called hyperglycemia, can develop. What increases the risk? Having a family member with type 2 diabetes. Being older than 64 years of age. Having had a temporary form of diabetes during a pregnancy. This is called gestational diabetes. Having had polycystic ovary syndrome (PCOS). Being overweight or obese. Being inactive and not getting much exercise. Having a history of heart disease. This may include problems with cholesterol levels, high levels of blood fats, or high blood pressure. What are the signs or symptoms? You may have no symptoms. If you do have symptoms, they may include: Increased hunger. Increased thirst. Needing to pee more often. Changes in how you see, like blurry vision. Feeling tired. How is this diagnosed? Prediabetes can be diagnosed with blood tests that check your blood sugar. One or more of these tests may be done: A fasting blood glucose (FBG) test. You won't be allowed to eat (you will fast) for at least 8 hours before a blood sample is taken. An A1C blood test, also called a hemoglobin A1C test. This test shows information about blood sugar levels over the past 2?3 months. An oral glucose tolerance test (OGTT). This test measures your blood sugar at two points in time: After you haven't eaten for a while. This is your baseline level. Two hours after you drink a beverage that has sugar in it. You may be diagnosed with prediabetes  if: Your FBG is 100?125 mg/dL (1.6-1.0 mmol/L). Your A1C level is 5.7?6.4% (39-46 mmol/mol). Your OGTT result is 140?199 mg/dL (9.6-04 mmol/L). These blood tests may need to be done again to be sure of the diagnosis. How is this treated? Treatment may include making changes to your diet and lifestyle. These changes can help lower your blood sugar and keep you  from getting type 2 diabetes. In some cases, medicine may be given to help lower your risk. Follow these instructions at home: Eating and drinking  Eat and drink as told. Follow a healthy meal plan. This includes eating lean proteins, whole grains, legumes, fresh fruits and vegetables, low-fat dairy products, and healthy fats. Meet with an expert in healthy eating called a dietitian. This person can help create a healthy eating plan that's right for you. Lifestyle Do moderate-intensity exercise. Do this for at least 30 minutes a day on 5 or more days each week, or as told by your health care provider. A mix of activities may be best. Good choices include brisk walking, swimming, biking, and weight lifting. Try to lose weight if your provider says it's OK. Losing 5-7% of your body weight can help reverse insulin resistance. Do not drink alcohol if: Your provider tells you not to drink. You're pregnant, may be pregnant, or plan to become pregnant. If you drink alcohol: Limit how much you have to: 0-1 drink a day if you're female. 0-2 drinks a day if you're female. Know how much alcohol is in your drink. In the [place] one drink is one 12 oz bottle of beer (355 mL), one 5 oz glass of wine (148 mL), or one 1 oz glass of hard liquor (44 mL). General instructions Take medicines only as told. You may be given medicines that help lower the risk of type 2 diabetes. Do not smoke, vape, or use nicotine or tobacco. Where to find more information American Diabetes Association: diabetes.org/about-diabetes/prediabetes Academy of Nutrition and Dietetics: eatright.org American Heart Association: Go to ThisJobs.cz. Click the search icon. Type "prediabetes" in the search box. Contact a health care provider if: You have any of these symptoms: Increased hunger. Peeing more often than usual. Increased thirst. Feeling tired. Changes in how you see, like blurry vision. Feeling like you may throw  up. Throwing up. Get help right away if: You have shortness of breath. You feel confused. This information is not intended to replace advice given to you by your health care provider. Make sure you discuss any questions you have with your health care provider. Document Revised: 02/20/2023 Document Reviewed: 02/20/2023 Elsevier Patient Education  2024 [employer].

## 2023-12-06 ENCOUNTER — Encounter: Payer: Self-pay | Admitting: Nurse Practitioner

## 2023-12-06 ENCOUNTER — Ambulatory Visit: Admitting: Nurse Practitioner

## 2023-12-06 VITALS — BP 135/85 | HR 75 | Temp 98.0°F | Ht 68.6 in | Wt 312.0 lb

## 2023-12-06 DIAGNOSIS — M0579 Rheumatoid arthritis with rheumatoid factor of multiple sites without organ or systems involvement: Secondary | ICD-10-CM

## 2023-12-06 DIAGNOSIS — F321 Major depressive disorder, single episode, moderate: Secondary | ICD-10-CM

## 2023-12-06 DIAGNOSIS — I10 Essential (primary) hypertension: Secondary | ICD-10-CM | POA: Diagnosis not present

## 2023-12-06 DIAGNOSIS — R7303 Prediabetes: Secondary | ICD-10-CM

## 2023-12-06 DIAGNOSIS — G4733 Obstructive sleep apnea (adult) (pediatric): Secondary | ICD-10-CM

## 2023-12-06 DIAGNOSIS — K2100 Gastro-esophageal reflux disease with esophagitis, without bleeding: Secondary | ICD-10-CM | POA: Diagnosis not present

## 2023-12-06 DIAGNOSIS — E559 Vitamin D deficiency, unspecified: Secondary | ICD-10-CM

## 2023-12-06 DIAGNOSIS — E7800 Pure hypercholesterolemia, unspecified: Secondary | ICD-10-CM | POA: Diagnosis not present

## 2023-12-06 DIAGNOSIS — Z0000 Encounter for general adult medical examination without abnormal findings: Secondary | ICD-10-CM

## 2023-12-06 DIAGNOSIS — E66813 Obesity, class 3: Secondary | ICD-10-CM | POA: Diagnosis not present

## 2023-12-06 DIAGNOSIS — M0510 Rheumatoid lung disease with rheumatoid arthritis of unspecified site: Secondary | ICD-10-CM

## 2023-12-06 DIAGNOSIS — E538 Deficiency of other specified B group vitamins: Secondary | ICD-10-CM

## 2023-12-06 DIAGNOSIS — Z6841 Body Mass Index (BMI) 40.0 and over, adult: Secondary | ICD-10-CM

## 2023-12-06 LAB — MICROALBUMIN, URINE WAIVED
Creatinine, Urine Waived: 200 mg/dL (ref 10–300)
Microalb, Ur Waived: 80 mg/L — ABNORMAL HIGH (ref 0–19)

## 2023-12-06 LAB — BAYER DCA HB A1C WAIVED: HB A1C (BAYER DCA - WAIVED): 5.8 % — ABNORMAL HIGH (ref 4.8–5.6)

## 2023-12-06 MED ORDER — BORIC ACID 600 MG VA SUPP: 1.0000 | Freq: Every evening | VAGINAL | 0 refills | Status: AC

## 2023-12-06 MED ORDER — SEMAGLUTIDE-WEIGHT MANAGEMENT 0.25 MG/0.5ML ~~LOC~~ SOAJ: 0.2500 mg | SUBCUTANEOUS | 1 refills | Status: DC

## 2023-12-06 NOTE — Assessment & Plan Note (Signed)
 A1c 5.8% today, remaining similar to previous.  Continue diet monitoring and initiate medication as needed if A1c 6.5% or greater.

## 2023-12-06 NOTE — Assessment & Plan Note (Signed)
 Chronic, ongoing, followed by rheumatology and pain clinic.  Continue these collaborations, recent notes reviewed.  Labs today: CMP, CBC, TSH.

## 2023-12-06 NOTE — Assessment & Plan Note (Signed)
 BMI 46.61 with underlying OSA, prediabetes, HTN/HLD, and RA.  Would benefit modest weight loss which we discussed today.  No family history of thyroid  cancer (MTC, MEN 2, thyroid  cell tumors) or pancreatitis.   We will work on starting Wegovy , educated her at length on this medication and use + side effects.  Recommended eating smaller high protein, low fat meals more frequently and exercising 30 mins a day 5 times a week with a goal of 10-15lb weight loss in the next 3 months. Patient voiced their understanding and motivation to adhere to these recommendations.

## 2023-12-06 NOTE — Progress Notes (Signed)
 BP 135/85   Pulse 75   Temp 98 F (36.7 C) (Oral)   Ht 5' 8.6" (1.742 m)   Wt (!) 312 lb (141.5 kg)   SpO2 94%   BMI 46.61 kg/m    Subjective:    Patient ID: Erin [facility], female    DOB: 1961-06-24, 63 y.o.   MRN: [id number]  HPI: Erin [facility] is a 63 y.o. female presenting on 12/06/2023 for comprehensive medical examination. Current medical complaints include: fatigue  She currently lives with: self Menopausal Symptoms: no  Saw GYN boric acid ordered for ongoing vaginal infections which she reports offered some benefit, but does not have anymore.  RHEUMATOID ARTHRITIS AND CHRONIC PAIN Follows with [facility] with rheumatology, no further Orencia  infusions, started on injections. Continues Actemra and Plaquenil + takes Meloxicam  as needed for pain. Continues to take B12 and vitamin D  daily for low levels.  Last saw pain clinic for back injection on 10/12/22 Pain control status: stable Duration: years Location: back, arms, elbows, wrists Quality: dull, aching, and throbbing Current Pain Level: 2/10 Previous Pain Level: 8/10 Breakthrough pain: no Benefit from narcotic medications: unknown What Activities task can be accomplished with current medication? Occasional ADLs Interested in weaning off narcotics:no   Stool softners/OTC fiber: no  Previous pain specialty evaluation: yes Non-narcotic analgesic meds: yes Narcotic contract: yes   SLEEP APNEA Has CPAP, had since she had weight loss surgery 9 years ago.  She gained all the weight back after surgery over the years.  Is not using CPAP.  Endorses fatigue all day and hard to get to sleep.  Sleeps lying down on her side.  In past used Trazodone  but this did not help with sleep.   Sleep apnea status: uncontrolled Duration: chronic Satisfied with current treatment?:  no CPAP use:  no Treament compliance:poor compliance Last sleep study: 9 to 10 years ago Treatments attempted: CPAP Wakes feeling refreshed:  yes Daytime  hypersomnolence:  no Fatigue:  yes Insomnia:  yes Good sleep hygiene:  yes Difficulty falling asleep:  yes Difficulty staying asleep:  no Snoring bothers bed partner:  yes Observed apnea by bed partner:  unknown Obesity:  yes Hypertension: no  Pulmonary hypertension:  no Coronary artery disease:  no  WEIGHT GAIN Does want to lose weight and has tried for >6 months with change in diet and regular activity.  No benefit from this.  She would like to try Wegovy .  No family history of thyroid  cancer (MTC, MEN 2, thyroid  cell tumors) or pancreatitis.  Has OSA and chronic pain with RA that weight loss may be beneficial for. Duration: years Previous attempts at weight loss: yes Complications of obesity: OSA, HTN, HLD, Prediabetes Peak weight: 350 lsb Weight loss goal: 190 lbs Weight loss to date: none Requesting obesity pharmacotherapy: yes Current weight loss supplements/medications: no Previous weight loss supplements/meds: no Calories:  2000  Clinical coverage for weight loss GLP's   Medication being dispensed is Wegovy  3 mL/28 day. Titration doses are 2 mL/28 days.   [x]  Product being prescribed is FDA approved for the indication, age, weight (if applicable) and not does not exceed dosing limits per the Prescribing Information per the clinical conditions for use.  [x]  Patient's baseline weight measured within the last 45 days as required by provider before dispensing.  [x]  Patient is new to therapy and One of the following:   [x]  The beneficiary is 64 years of age or over and has ONE of the following:  [x]   A BMI greater than or equal to 30 kg/m2  []  A BMI greater than or equal to 27 kg/m2 with at least one weight-related comorbidity/risk factor/complication (i.e. hypertension, type 2 diabetes, obstructive sleep apnea, cardiovascular disease, dyslipidemia)  If patient has one weight-related comorbidity/risk factor/complication (i.e. hypertension, type 2 diabetes, obstructive  sleep apnea, cardiovascular disease, dyslipidemia), please list OSA, HTN, CAD. Prediabetes.  Patient suffers from weight-related comorbidity/risk factor/complication OSA, HTN, CAD. Prediabetes.  [x]  The beneficiary is currently on and will continue lifestyle modification including structured nutrition and physical activity, unless physical activity is not clinically appropriate at the time GLP1 therapy commences AND  [x]  The beneficiary will NOT be using the requested agent in combination with another GLP-1 receptor agonist agent AND  [x]  The beneficiary does NOT have any FDA-labeled contraindications to the requested agent, including pregnancy, lactation, history of medullary thyroid  cancer or multiple endocrine neoplasia type II.  Last BMI/Weight/Height recorded Estimated body mass index is 46.61 kg/m as calculated from the following:   Height as of this encounter: 5' 8.6" (1.742 m).   Weight as of this encounter: 312 lb (141.5 kg).    Impaired Fasting Glucose HbA1C:  Lab Results  Component Value Date   HGBA1C 5.8 (H) 12/06/2023  Duration of elevated blood sugar:  Polydipsia: no Polyuria: no Weight change: no Visual disturbance: no Glucose Monitoring: no    Accucheck frequency: Not Checking    Fasting glucose:     Post prandial:  Diabetic Education: Not Completed Family history of diabetes: yes   HYPERTENSION / HYPERLIPIDEMIA Currently on no medications. Echo on 06/09/22 and this was reassuring with EF 60-65%.  Has not seen cardiology since 2023.   Satisfied with current treatment? yes Duration of hypertension: chronic Duration of hyperlipidemia: chronic Aspirin: no Recent stressors: no Recurrent headaches: no Visual changes: no Palpitations: no Dyspnea: with activity at baseline Chest pain: no Lower extremity edema: no Dizzy/lightheaded: no  The 10-year ASCVD risk score (Arnett DK, et al., 2019) is: 7.6%   Values used to calculate the score:     Age: 46 years      Sex: Female     Is Non-Hispanic African American: Yes     Diabetic: No     Tobacco smoker: No     Systolic Blood Pressure: 135 mmHg     Is BP treated: No     HDL Cholesterol: 60 mg/dL     Total Cholesterol: 186 mg/dL  Depression Screen done today and results listed below:     12/06/2023    9:09 AM 11/01/2023    9:45 AM 07/28/2023    8:45 AM 05/19/2023    8:08 AM 05/05/2023    9:12 AM  Depression screen PHQ 2/9  Decreased Interest 0 1 0 0 0  Down, Depressed, Hopeless 0 0 1 0 0  PHQ - 2 Score 0 1 1 0 0  Altered sleeping 3  1 3 3   Tired, decreased energy 2  1 3 1   Change in appetite 0  0 0 0  Feeling bad or failure about yourself  0  0 0 0  Trouble concentrating 0  0 0 0  Moving slowly or fidgety/restless 0  0 0 0  Suicidal thoughts 0  0 0 0  PHQ-9 Score 5  3 6 4   Difficult doing work/chores Not difficult at all  Somewhat difficult Somewhat difficult Not difficult at all      12/06/2023    9:09 AM 07/28/2023    8:45  AM 05/19/2023    8:08 AM 05/05/2023    9:12 AM  GAD 7 : Generalized Anxiety Score  Nervous, Anxious, on Edge 0 0 0 0  Control/stop worrying 0 0 0 0  Worry too much - different things 1 0 1 0  Trouble relaxing 1 0 0 0  Restless 0 0 0 0  Easily annoyed or irritable 0 0 0 0  Afraid - awful might happen 0 0 0 0  Total GAD 7 Score 2 0 1 0  Anxiety Difficulty Not difficult at all Not difficult at all Not difficult at all Not difficult at all        05/05/2023    9:12 AM 05/19/2023    8:07 AM 07/28/2023    8:44 AM 11/01/2023    9:45 AM 12/06/2023    9:07 AM  Fall Risk  Falls in the past year? 0 0 0 1 1  Was there an injury with Fall? 0 0 0 0 0  Fall Risk Category Calculator 0 0 0 1 1  Patient at Risk for Falls Due to No Fall Risks No Fall Risks No Fall Risks  No Fall Risks  Fall risk Follow up Falls evaluation completed Falls evaluation completed Falls evaluation completed  Falls evaluation completed    Past Medical History:  Past Medical History:  Diagnosis Date    Boils    Ear mass    GERD (gastroesophageal reflux disease)    Hemorrhoids    Herpes simplex virus infection    HSV infection    Lumbago with sciatica    OA (osteoarthritis) of knee    Obesity    Overactive bladder    PONV (postoperative nausea and vomiting)    Pre-diabetes    RA (rheumatoid arthritis) (HCC)    Sleep apnea    does not use cpap-had bariatric surgery and has no issues since surgery   Urge and stress incontinence     Surgical History:  Past Surgical History:  Procedure Laterality Date   CHOLECYSTECTOMY  2011   COLONOSCOPY N/A 03/19/2021   Procedure: COLONOSCOPY;  Surgeon: Luke Salaam, MD;  Location: [facility] ENDOSCOPY;  Service: Gastroenterology;  Laterality: N/A;   ESOPHAGOGASTRODUODENOSCOPY (EGD) WITH PROPOFOL  N/A 02/07/2023   Procedure: ESOPHAGOGASTRODUODENOSCOPY (EGD) WITH PROPOFOL ;  Surgeon: Luke Salaam, MD;  Location: [facility] ENDOSCOPY;  Service: Gastroenterology;  Laterality: N/A;   FRACTURE SURGERY Right    ankle as a child   JOINT REPLACEMENT Left 01/28/2021   KNEE ARTHROPLASTY Right 07/11/2017   Procedure: COMPUTER ASSISTED TOTAL KNEE ARTHROPLASTY;  Surgeon: Arlyne Lame, MD;  Location: [facility] ORS;  Service: Orthopedics;  Laterality: Right;   KNEE ARTHROPLASTY Left 01/28/2021   Procedure: COMPUTER ASSISTED TOTAL KNEE ARTHROPLASTY;  Surgeon: Arlyne Lame, MD;  Location: [facility] ORS;  Service: Orthopedics;  Laterality: Left;   SLEEVE GASTROPLASTY     TUMOR REMOVAL Right 2010   ear    Medications:  Current Outpatient Medications on File Prior to Visit  Medication Sig   acyclovir  (ZOVIRAX ) 400 MG tablet Take 1 tablet (400 mg total) by mouth 2 (two) times daily as needed.   Cholecalciferol  1.25 MG (50000 UT) TABS Take 1 tablet by mouth once a week.   DULoxetine  (CYMBALTA ) 20 MG capsule Take 40 mg by mouth every morning.   famotidine  (PEPCID ) 40 MG tablet Take 1 tablet (40 mg total) by mouth at bedtime.   folic acid  (FOLVITE ) 1 MG tablet Take 1 mg by mouth  daily.   hydroxychloroquine (PLAQUENIL)  200 MG tablet Take 200 mg by mouth 2 (two) times daily.   meloxicam  (MOBIC ) 15 MG tablet Take 15 mg by mouth daily.   methotrexate  (RHEUMATREX) 2.5 MG tablet Take 20 mg by mouth once a week.   pantoprazole  (PROTONIX ) 40 MG tablet Take 1 tablet (40 mg total) by mouth 2 (two) times daily.   pregabalin  (LYRICA ) 150 MG capsule Take 1 capsule (150 mg total) by mouth at bedtime.   Tocilizumab (ACTEMRA ACTPEN) 162 MG/0.9ML SOAJ Inject into the skin.   No current facility-administered medications on file prior to visit.    Allergies:  No Known Allergies  Social History:  Social History   Socioeconomic History   Marital status: Single    Spouse name: Not on file   Number of children: Not on file   Years of education: Not on file   Highest education level: Not on file  Occupational History   Not on file  Tobacco Use   Smoking status: Never   Smokeless tobacco: Never  Vaping Use   Vaping status: Never Used  Substance and Sexual Activity   Alcohol use: No   Drug use: No   Sexual activity: Not Currently  Other Topics Concern   Not on file  Social History Narrative   Not on file   Social Drivers of Health   Financial Resource Strain: Medium Risk (12/06/2023)   Overall Financial Resource Strain (CARDIA)    Difficulty of Paying Living Expenses: Somewhat hard  Food Insecurity: Food Insecurity Present (12/06/2023)   Hunger Vital Sign    Worried About Running Out of Food in the Last Year: Often true    Ran Out of Food in the Last Year: Often true  Transportation Needs: No Transportation Needs (12/06/2023)   PRAPARE - Administrator, Civil Service (Medical): No    Lack of Transportation (Non-Medical): No  Physical Activity: Insufficiently Active (12/06/2023)   Exercise Vital Sign    Days of Exercise per Week: 2 days    Minutes of Exercise per Session: 20 min  Stress: No Stress Concern Present (12/06/2023)   [employer] of  Occupational Health - Occupational Stress Questionnaire    Feeling of Stress : Only a little  Social Connections: Moderately Isolated (12/06/2023)   Social Connection and Isolation Panel [NHANES]    Frequency of Communication with Friends and Family: More than three times a week    Frequency of Social Gatherings with Friends and Family: More than three times a week    Attends Religious Services: More than 4 times per year    Active Member of [employer] or Organizations: No    Attends Banker Meetings: Never    Marital Status: Never married  Intimate Partner Violence: Not At Risk (12/06/2023)   Humiliation, Afraid, Rape, and Kick questionnaire    Fear of Current or Ex-Partner: No    Emotionally Abused: No    Physically Abused: No    Sexually Abused: No   Social History   Tobacco Use  Smoking Status Never  Smokeless Tobacco Never   Social History   Substance and Sexual Activity  Alcohol Use No    Family History:  Family History  Problem Relation Age of Onset   Cancer Mother        liver   Diabetes Mother    Hyperlipidemia Mother    Hypertension Mother    Cirrhosis Father        liver   Hypertension Daughter  Stroke Maternal Grandmother    Stroke Maternal Grandfather    Heart disease Paternal Grandmother    Diabetes Paternal Grandmother    Heart disease Paternal Grandfather    Diabetes Paternal Grandfather     Past medical history, surgical history, medications, allergies, family history and social history reviewed with patient today and changes made to appropriate areas of the chart.   ROS All other ROS negative except what is listed above and in the HPI.      Objective:    BP 135/85   Pulse 75   Temp 98 F (36.7 C) (Oral)   Ht 5' 8.6" (1.742 m)   Wt (!) 312 lb (141.5 kg)   SpO2 94%   BMI 46.61 kg/m   Wt Readings from Last 3 Encounters:  12/06/23 (!) 312 lb (141.5 kg)  11/01/23 (!) 311 lb (141.1 kg)  08/12/23 (!) 310 lb (140.6 kg)    Physical  Exam Vitals and nursing note reviewed. Exam conducted with a chaperone present.  Constitutional:      General: She is awake. She is not in acute distress.    Appearance: She is well-developed and well-groomed. She is obese. She is not ill-appearing or toxic-appearing.  HENT:     Head: Normocephalic and atraumatic.     Right Ear: Hearing, tympanic membrane, ear canal and external ear normal. No drainage.     Left Ear: Hearing, tympanic membrane, ear canal and external ear normal. No drainage.     Nose: Nose normal.     Right Sinus: No maxillary sinus tenderness or frontal sinus tenderness.     Left Sinus: No maxillary sinus tenderness or frontal sinus tenderness.     Mouth/Throat:     Mouth: Mucous membranes are moist.     Pharynx: Oropharynx is clear. Uvula midline. No pharyngeal swelling, oropharyngeal exudate or posterior oropharyngeal erythema.  Eyes:     General: Lids are normal.        Right eye: No discharge.        Left eye: No discharge.     Extraocular Movements: Extraocular movements intact.     Conjunctiva/sclera: Conjunctivae normal.     Pupils: Pupils are equal, round, and reactive to light.     Visual Fields: Right eye visual fields normal and left eye visual fields normal.  Neck:     Thyroid : No thyromegaly.     Vascular: No carotid bruit.     Trachea: Trachea normal.  Cardiovascular:     Rate and Rhythm: Normal rate and regular rhythm.     Heart sounds: Normal heart sounds. No murmur heard.    No gallop.  Pulmonary:     Effort: Pulmonary effort is normal. No accessory muscle usage or respiratory distress.     Breath sounds: Normal breath sounds.  Chest:  Breasts:    Right: Normal.     Left: Normal.  Abdominal:     General: Bowel sounds are normal.     Palpations: Abdomen is soft. There is no hepatomegaly or splenomegaly.     Tenderness: There is no abdominal tenderness.  Musculoskeletal:        General: Normal range of motion.     Cervical back: Normal  range of motion and neck supple.     Right lower leg: No edema.     Left lower leg: No edema.  Lymphadenopathy:     Head:     Right side of head: No submental, submandibular, tonsillar, preauricular or posterior auricular adenopathy.  Left side of head: No submental, submandibular, tonsillar, preauricular or posterior auricular adenopathy.     Cervical: No cervical adenopathy.     Upper Body:     Right upper body: No supraclavicular, axillary or pectoral adenopathy.     Left upper body: No supraclavicular, axillary or pectoral adenopathy.  Skin:    General: Skin is warm and dry.     Capillary Refill: Capillary refill takes less than 2 seconds.     Findings: No rash.  Neurological:     Mental Status: She is alert and oriented to person, place, and time.     Gait: Gait is intact.     Deep Tendon Reflexes: Reflexes are normal and symmetric.     Reflex Scores:      Brachioradialis reflexes are 2+ on the right side and 2+ on the left side.      Patellar reflexes are 2+ on the right side and 2+ on the left side. Psychiatric:        Attention and Perception: Attention normal.        Mood and Affect: Mood normal.        Speech: Speech normal.        Behavior: Behavior normal. Behavior is cooperative.        Thought Content: Thought content normal.        Judgment: Judgment normal.    Results for orders placed or performed in visit on 12/06/23  Bayer DCA Hb A1c Waived   Collection Time: 12/06/23  9:16 AM  Result Value Ref Range   HB A1C (BAYER DCA - WAIVED) 5.8 (H) 4.8 - 5.6 %  Microalbumin, Urine Waived   Collection Time: 12/06/23  9:16 AM  Result Value Ref Range   Microalb, Ur Waived 80 (H) 0 - 19 mg/L   Creatinine, Urine Waived 200 10 - 300 mg/dL   Microalb/Creat Ratio 30-300 (H) <30 mg/g      Assessment & Plan:   Problem List Items Addressed This Visit       Cardiovascular and Mediastinum   Benign hypertension (Chronic)   Chronic, stable without medication.  Recommend  she monitor BP at least a few mornings a week at [facility] and document.  DASH diet at [facility].  Continue current diet focus.  Labs today: CMP, CBC, TSH, urine ALB.  Urine ALB 80 May 2025, may benefit an ACE or ARB added on in future.      Relevant Orders   Microalbumin, Urine Waived (Completed)   CBC with Differential/Platelet   Comprehensive metabolic panel with GFR   TSH     Respiratory   Obstructive sleep apnea of adult (Chronic)   Instructed to use CPAP consistently for overall health benefits.  She currently has not used her CPAP for years.  Suspect much of her insomnia and fatigue is coming from OSA without CPAP use.  We will obtain a new sleep study.  Would benefit modest weight loss to help with OSA, will work on coverage for Wegovy .      Relevant Medications   Semaglutide -Weight Management 0.25 MG/0.5ML SOAJ   Other Relevant Orders   Ambulatory referral to Sleep Studies   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  Followed by rheumatology.  Continue this collaboration.  Recent notes reviewed.        Digestive   Gastroesophageal reflux disease with esophagitis (Chronic)   Chronic, improved at this time.  Continue collaboration with GI.  She did notice benefit  from Voquenza in past, but could not afford.  Continue Protonix .  Risks of PPI use were discussed with patient including bone loss, C. Diff diarrhea, pneumonia, infections, CKD, electrolyte abnormalities.  Pt. Verbalizes understanding and chooses to continue the medication. Mag level annually.       Relevant Orders   Magnesium      Musculoskeletal and Integument   Rheumatoid arthritis involving multiple sites with positive rheumatoid factor (HCC) - Primary (Chronic)   Chronic, ongoing, followed by rheumatology and pain clinic.  Continue these collaborations, recent notes reviewed.  Labs today: CMP, CBC, TSH.      Relevant Orders   CBC with Differential/Platelet   Comprehensive metabolic  panel with GFR     Other   Prediabetes (Chronic)   A1c 5.8% today, remaining similar to previous.  Continue diet monitoring and initiate medication as needed if A1c 6.5% or greater.      Relevant Orders   Bayer DCA Hb A1c Waived (Completed)   Microalbumin, Urine Waived (Completed)   Vitamin D  deficiency   Ongoing.  Continue supplement as ordered.  Recheck today.      Relevant Orders   VITAMIN D  25 Hydroxy (Vit-D Deficiency, Fractures)   Obesity   BMI 46.61 with underlying OSA, prediabetes, HTN/HLD, and RA.  Would benefit modest weight loss which we discussed today.  No family history of thyroid  cancer (MTC, MEN 2, thyroid  cell tumors) or pancreatitis.   We will work on starting Wegovy , educated her at length on this medication and use + side effects.  Recommended eating smaller high protein, low fat meals more frequently and exercising 30 mins a day 5 times a week with a goal of 10-15lb weight loss in the next 3 months. Patient voiced their understanding and motivation to adhere to these recommendations.       Relevant Medications   Semaglutide -Weight Management 0.25 MG/0.5ML SOAJ   Elevated low density lipoprotein (LDL) cholesterol level   Chronic, ongoing -- noted on labs, ASCVD 5.5% and last LDL <190.  Continue diet focus at [facility]. Lipid panel today.  Initiate medication as needed.      Relevant Orders   Comprehensive metabolic panel with GFR   Lipid Panel w/o Chol/HDL Ratio   Depression, major, single episode, moderate (HCC)   Ongoing, continue Duloxetine  which benefits both mood and pain.  Denies SI/HI.  Discussed addition of therapy or Wellbutrin, at this time wishes to continue current regimen and will adjust as needed.        B12 deficiency   Chronic, ongoing.  At this time recommend she continue supplement daily 1000 MCG daily.  Educated her on this and benefit. Recheck level today.      Relevant Orders   Vitamin B12   Other Visit Diagnoses       Encounter for  annual physical exam       Annual physical today with labs and health maintenance reviewed, discussed with patient.        Follow up plan: Return in about 6 weeks (around 01/17/2024) for WEIGHT CHECK and OSA.   LABORATORY TESTING:  - Pap smear: up to date  IMMUNIZATIONS:   - Tdap: Tetanus vaccination status reviewed: last tetanus booster within 10 years. - Influenza: Up to date - Pneumovax: Up to date - Prevnar: Not applicable - COVID: Up to date - HPV: Not applicable - Shingrix vaccine: Refused  SCREENING: -Mammogram: Ordered today  - Colonoscopy: Up to date  - Bone Density: Not applicable  -Hearing Test:  Not applicable  -Spirometry: Not applicable   PATIENT COUNSELING:   Advised to take 1 mg of folate supplement per day if capable of pregnancy.   Sexuality: Discussed sexually transmitted diseases, partner selection, use of condoms, avoidance of unintended pregnancy  and contraceptive alternatives.   Advised to avoid cigarette smoking.  I discussed with the patient that most people either abstain from alcohol or drink within safe limits (<=14/week and <=4 drinks/occasion for males, <=7/weeks and <= 3 drinks/occasion for females) and that the risk for alcohol disorders and other health effects rises proportionally with the number of drinks per week and how often a drinker exceeds daily limits.  Discussed cessation/primary prevention of drug use and availability of treatment for abuse.   Diet: Encouraged to adjust caloric intake to maintain  or achieve ideal body weight, to reduce intake of dietary saturated fat and total fat, to limit sodium intake by avoiding high sodium foods and not adding table salt, and to maintain adequate dietary potassium and calcium preferably from fresh fruits, vegetables, and low-fat dairy products.    Stressed the importance of regular exercise  Injury prevention: Discussed safety belts, safety helmets, smoke detector, smoking near bedding or  upholstery.   Dental health: Discussed importance of regular tooth brushing, flossing, and dental visits.    NEXT PREVENTATIVE PHYSICAL DUE IN 1 YEAR. Return in about 6 weeks (around 01/17/2024) for WEIGHT CHECK and OSA.

## 2023-12-06 NOTE — Assessment & Plan Note (Signed)
 Chronic, ongoing.  At this time recommend she continue supplement daily 1000 MCG daily.  Educated her on this and benefit. Recheck level today.

## 2023-12-06 NOTE — Assessment & Plan Note (Signed)
 Chronic, ongoing -- noted on labs, ASCVD 5.5% and last LDL <190.  Continue diet focus at home. Lipid panel today.  Initiate medication as needed.

## 2023-12-06 NOTE — Assessment & Plan Note (Signed)
Ongoing, continue Duloxetine which benefits both mood and pain.  Denies SI/HI.  Discussed addition of therapy or Wellbutrin, at this time wishes to continue current regimen and will adjust as needed.

## 2023-12-06 NOTE — Assessment & Plan Note (Signed)
 Chronic, stable without medication.  Recommend she monitor BP at least a few mornings a week at home and document.  DASH diet at home.  Continue current diet focus.  Labs today: CMP, CBC, TSH, urine ALB.  Urine ALB 80 May 2025, may benefit an ACE or ARB added on in future.

## 2023-12-06 NOTE — Assessment & Plan Note (Signed)
 Instructed to use CPAP consistently for overall health benefits.  She currently has not used her CPAP for years.  Suspect much of her insomnia and fatigue is coming from OSA without CPAP use.  We will obtain a new sleep study.  Would benefit modest weight loss to help with OSA, will work on coverage for Wegovy .

## 2023-12-06 NOTE — Assessment & Plan Note (Signed)
 Chronic, ongoing.  Followed by rheumatology.  Continue this collaboration.  Recent notes reviewed.

## 2023-12-06 NOTE — Assessment & Plan Note (Signed)
Ongoing.  Continue supplement as ordered.  Recheck today.

## 2023-12-06 NOTE — Assessment & Plan Note (Signed)
 Chronic, improved at this time.  Continue collaboration with GI.  She did notice benefit from Voquenza in past, but could not afford.  Continue Protonix .  Risks of PPI use were discussed with patient including bone loss, C. Diff diarrhea, pneumonia, infections, CKD, electrolyte abnormalities.  Pt. Verbalizes understanding and chooses to continue the medication. Mag level annually.

## 2023-12-07 ENCOUNTER — Telehealth: Payer: Self-pay

## 2023-12-07 LAB — CBC WITH DIFFERENTIAL/PLATELET
Basophils Absolute: 0.1 10*3/uL (ref 0.0–0.2)
Basos: 1 %
EOS (ABSOLUTE): 0.3 10*3/uL (ref 0.0–0.4)
Eos: 7 %
Hematocrit: 40.2 % (ref 34.0–46.6)
Hemoglobin: 13.2 g/dL (ref 11.1–15.9)
Immature Grans (Abs): 0 10*3/uL (ref 0.0–0.1)
Immature Granulocytes: 0 %
Lymphocytes Absolute: 1.5 10*3/uL (ref 0.7–3.1)
Lymphs: 35 %
MCH: 30.1 pg (ref 26.6–33.0)
MCHC: 32.8 g/dL (ref 31.5–35.7)
MCV: 92 fL (ref 79–97)
Monocytes Absolute: 0.4 10*3/uL (ref 0.1–0.9)
Monocytes: 9 %
Neutrophils Absolute: 2.1 10*3/uL (ref 1.4–7.0)
Neutrophils: 48 %
Platelets: 243 10*3/uL (ref 150–450)
RBC: 4.38 x10E6/uL (ref 3.77–5.28)
RDW: 15.7 % — ABNORMAL HIGH (ref 11.7–15.4)
WBC: 4.4 10*3/uL (ref 3.4–10.8)

## 2023-12-07 LAB — COMPREHENSIVE METABOLIC PANEL WITH GFR
ALT: 19 IU/L (ref 0–32)
AST: 17 IU/L (ref 0–40)
Albumin: 3.9 g/dL (ref 3.9–4.9)
Alkaline Phosphatase: 92 IU/L (ref 44–121)
BUN/Creatinine Ratio: 20 (ref 12–28)
BUN: 14 mg/dL (ref 8–27)
Bilirubin Total: 0.4 mg/dL (ref 0.0–1.2)
CO2: 21 mmol/L (ref 20–29)
Calcium: 9.3 mg/dL (ref 8.7–10.3)
Chloride: 104 mmol/L (ref 96–106)
Creatinine, Ser: 0.71 mg/dL (ref 0.57–1.00)
Globulin, Total: 2.3 g/dL (ref 1.5–4.5)
Glucose: 92 mg/dL (ref 70–99)
Potassium: 4.1 mmol/L (ref 3.5–5.2)
Sodium: 140 mmol/L (ref 134–144)
Total Protein: 6.2 g/dL (ref 6.0–8.5)
eGFR: 95 mL/min/{1.73_m2} (ref >59)

## 2023-12-07 LAB — VITAMIN B12: Vitamin B-12: 497 pg/mL (ref 232–1245)

## 2023-12-07 LAB — MAGNESIUM: Magnesium: 1.9 mg/dL (ref 1.6–2.3)

## 2023-12-07 LAB — VITAMIN D 25 HYDROXY (VIT D DEFICIENCY, FRACTURES): Vit D, 25-Hydroxy: 39.5 ng/mL (ref 30.0–100.0)

## 2023-12-07 LAB — LIPID PANEL W/O CHOL/HDL RATIO
Cholesterol, Total: 228 mg/dL — ABNORMAL HIGH (ref 100–199)
HDL: 62 mg/dL (ref >39)
LDL Chol Calc (NIH): 142 mg/dL — ABNORMAL HIGH (ref 0–99)
Triglycerides: 136 mg/dL (ref 0–149)
VLDL Cholesterol Cal: 24 mg/dL (ref 5–40)

## 2023-12-07 LAB — TSH: TSH: 1.2 u[IU]/mL (ref 0.450–4.500)

## 2023-12-07 NOTE — Telephone Encounter (Signed)
 PA for Wegovy  initiated and submitted via Cover My Meds. Key: [id number]

## 2023-12-16 ENCOUNTER — Other Ambulatory Visit: Payer: Self-pay | Admitting: Nurse Practitioner

## 2023-12-16 DIAGNOSIS — G4733 Obstructive sleep apnea (adult) (pediatric): Secondary | ICD-10-CM

## 2023-12-16 DIAGNOSIS — Z6841 Body Mass Index (BMI) 40.0 and over, adult: Secondary | ICD-10-CM

## 2023-12-16 NOTE — Telephone Encounter (Signed)
 Copied from CRM [id number]. Topic: Clinical - Medication Refill >> Dec 16, 2023  2:42 PM Erin Contreras wrote: Medication: Semaglutide -Weight Management 0.25 MG/0.5ML SOAJ (WEGOVY ) needs prior auth. Erin Contreras from [facility] number is [phone].   Has the patient contacted their pharmacy? Yes (Agent: If no, request that the patient contact the pharmacy for the refill. If patient does not wish to contact the pharmacy document the reason why and proceed with request.) (Agent: If yes, when and what did the pharmacy advise?)  This is the patient's preferred pharmacy:  [facility] [address] [place] - [address] [place] [address] Phone: [phone] Fax: [phone]  [facility] [place], [place] - [address] [phone] [address] [place] [address] Phone: [phone] Fax: [phone]  Is this the correct pharmacy for this prescription? Yes If no, delete pharmacy and type the correct one.   Has the prescription been filled recently? No  Is the patient out of the medication? Yes  Has the patient been seen for an appointment in the last year OR does the patient have an upcoming appointment? Yes  Can we respond through MyChart? No  Agent: Please be advised that Rx refills may take up to 3 business days. We ask that you follow-up with your pharmacy.

## 2023-12-20 ENCOUNTER — Telehealth: Payer: Self-pay

## 2023-12-20 NOTE — Telephone Encounter (Signed)
 Copied from CRM [id number]. Topic: Clinical - Medication Question >> Dec 20, 2023 11:27 AM DeAngela L wrote: Reason for CRM: [facility]  [facility]ication and the medicaid dept spoke with the nurse in Providers office (and says the pt is approved to get the medication) about Patient getting the prescription and the patient would like to ask if there was any updates about the wegovy  medication and if she will be able to get a prescription  Pt number [phone] [facility])  [facility] [address] [place] - [address] [place] [address] Phone: [phone] Fax: [phone]

## 2023-12-21 ENCOUNTER — Telehealth: Payer: Self-pay | Admitting: Nurse Practitioner

## 2023-12-21 NOTE — Telephone Encounter (Signed)
 Copied from CRM [id number]. Topic: Referral - Question >> Dec 20, 2023 11:26 AM DeAngela L wrote: Reason for CRM: Patient calling to ask if there was an order for a sleep study placed for her, she know it could be a longer wait but just checking the status and if this was created  Pt number [phone] (M)

## 2023-12-23 NOTE — Telephone Encounter (Signed)
 Called and spoke with patient. [facility] has been trying to contact patient to schedule the sleep study. Provided patient with their phone number for patient to contact them to schedule.

## 2023-12-29 ENCOUNTER — Telehealth: Payer: Self-pay | Admitting: Nurse Practitioner

## 2023-12-29 NOTE — Telephone Encounter (Signed)
 Copied from CRM [id number]. Topic: Clinical - Medication Question >> Dec 29, 2023 11:58 AM Chrystal Crape R wrote: Pt is calling to notify provider that [facility] and [facility] has told [facility] to follow up with her Provider for the status of the medication.

## 2023-12-30 NOTE — Telephone Encounter (Signed)
 [facility]. Pharmacy states that the medication is still needing PA. Routing to PA team to assist.

## 2024-01-04 ENCOUNTER — Telehealth: Payer: Self-pay

## 2024-01-04 ENCOUNTER — Other Ambulatory Visit (HOSPITAL_COMMUNITY): Payer: Self-pay

## 2024-01-04 NOTE — Telephone Encounter (Signed)
 Pharmacy Patient Advocate Encounter   Received notification from Pt Calls Messages that prior authorization for Wegovy  0.25MG /0.5ML auto-injectors is required/requested.   Insurance verification completed.   The patient is insured through [facility] .   Per test claim: PA required; PA submitted to above mentioned insurance via CoverMyMeds Key/confirmation #/EOC [employer] Status is pending

## 2024-01-04 NOTE — Telephone Encounter (Signed)
 PA request has been Submitted. New Encounter has been or will be created for follow up. For additional info see Pharmacy Prior Auth telephone encounter from 01-04-2024.

## 2024-01-06 ENCOUNTER — Telehealth: Payer: Self-pay

## 2024-01-06 NOTE — Telephone Encounter (Signed)
 PA request has been Denied. New Encounter has been or will be created for follow up. For additional info see Pharmacy Prior Auth telephone encounter from 01/04/2024.

## 2024-01-06 NOTE — Telephone Encounter (Signed)
 Copied from CRM [id number]. Topic: Clinical - Medication Question >> Dec 29, 2023 11:58 AM Chrystal Crape R wrote: Pt is calling to notify provider that [facility] and [facility] has told [facility] to follow up with her Provider for the status of the medication. >> Jan 05, 2024  3:11 PM Felizardo Hotter wrote: Pt calling regarding the status on her Wegovy . She would like a call back at [phone]

## 2024-01-06 NOTE — Telephone Encounter (Signed)
 Routing to PA team to check into PA

## 2024-01-06 NOTE — Telephone Encounter (Signed)
 Noted

## 2024-01-06 NOTE — Telephone Encounter (Signed)
 Pharmacy Patient Advocate Encounter  Received notification from [facility] that Prior Authorization for Wegovy  Inj 0.25mg  has been DENIED.  Full denial letter will be uploaded to the media tab. See denial reason below.   PA #/Case ID/Reference #: [id number]

## 2024-01-14 NOTE — Patient Instructions (Incomplete)

## 2024-01-16 ENCOUNTER — Telehealth: Payer: Self-pay | Admitting: Nurse Practitioner

## 2024-01-16 ENCOUNTER — Other Ambulatory Visit: Payer: Self-pay | Admitting: Student in an Organized Health Care Education/Training Program

## 2024-01-16 ENCOUNTER — Telehealth: Payer: Self-pay | Admitting: Student in an Organized Health Care Education/Training Program

## 2024-01-16 NOTE — Telephone Encounter (Signed)
 Patient states she needs a med refill but does not know which med. I let her know she has refills on her Lyrica  and her med refill appt. She says she has another medication that needs refilled. She is going to call the pharmacy to see if they have a refill.

## 2024-01-16 NOTE — Telephone Encounter (Signed)
 Patient wants to go ahead with injections. Erin Contreras this will need authorization, s;he has medicaid. The orders are in from last visit 11-01-23.

## 2024-01-17 ENCOUNTER — Ambulatory Visit: Admitting: Nurse Practitioner

## 2024-01-19 DIAGNOSIS — Z79631 Long term (current) use of antimetabolite agent: Secondary | ICD-10-CM | POA: Diagnosis not present

## 2024-01-19 DIAGNOSIS — M059 Rheumatoid arthritis with rheumatoid factor, unspecified: Secondary | ICD-10-CM | POA: Diagnosis not present

## 2024-01-20 ENCOUNTER — Telehealth: Payer: Self-pay

## 2024-01-20 DIAGNOSIS — G4730 Sleep apnea, unspecified: Secondary | ICD-10-CM | POA: Diagnosis not present

## 2024-01-20 DIAGNOSIS — M0579 Rheumatoid arthritis with rheumatoid factor of multiple sites without organ or systems involvement: Secondary | ICD-10-CM | POA: Diagnosis not present

## 2024-01-20 DIAGNOSIS — R0602 Shortness of breath: Secondary | ICD-10-CM | POA: Diagnosis not present

## 2024-01-20 NOTE — Telephone Encounter (Unsigned)
 Copied from CRM [id number]. Topic: Clinical - Medication Question >> Jan 20, 2024  9:52 AM Marissa P wrote: Reason for CRM: Patient is calling again regarding Wegovy  medication and needing that pre authorization done please been over a month. [facility] stated that's he needs be a diabetic or heart problems to be on it. Please follow up with patient.

## 2024-01-28 NOTE — Patient Instructions (Incomplete)
 Prediabetes: What to Know Prediabetes is when your blood sugar, also called glucose, is at a higher level than normal but not high enough for you to be diagnosed with type 2 diabetes (type 2 diabetes mellitus). Having prediabetes puts you at risk for getting type 2 diabetes. By making some healthy changes, you may be able to prevent or delay getting type 2 diabetes. This is important because type 2 diabetes can lead to serious problems. Some of these include: Heart disease. Stroke. Blindness. Kidney disease. Depression. Poor blood flow in the feet and legs. In very bad cases, this could lead to having a leg removed by surgery (amputation). What are the causes? The exact cause of prediabetes isn't known. It may result from insulin resistance. Insulin resistance happens when cells in the body don't respond properly to insulin that the body makes. This can cause too much sugar to build up in the blood. High blood sugar, also called hyperglycemia, can develop. What increases the risk? Having a family member with type 2 diabetes. Being older than 64 years of age. Having had a temporary form of diabetes during a pregnancy. This is called gestational diabetes. Having had polycystic ovary syndrome (PCOS). Being overweight or obese. Being inactive and not getting much exercise. Having a history of heart disease. This may include problems with cholesterol levels, high levels of blood fats, or high blood pressure. What are the signs or symptoms? You may have no symptoms. If you do have symptoms, they may include: Increased hunger. Increased thirst. Needing to pee more often. Changes in how you see, like blurry vision. Feeling tired. How is this diagnosed? Prediabetes can be diagnosed with blood tests that check your blood sugar. One or more of these tests may be done: A fasting blood glucose (FBG) test. You won't be allowed to eat (you will fast) for at least 8 hours before a blood sample is  taken. An A1C blood test, also called a hemoglobin A1C test. This test shows information about blood sugar levels over the past 2?3 months. An oral glucose tolerance test (OGTT). This test measures your blood sugar at two points in time: After you haven't eaten for a while. This is your baseline level. Two hours after you drink a beverage that has sugar in it. You may be diagnosed with prediabetes if: Your FBG is 100?125 mg/dL (4.3-3.0 mmol/L). Your A1C level is 5.7?6.4% (39-46 mmol/mol). Your OGTT result is 140?199 mg/dL (2.1-88 mmol/L). These blood tests may need to be done again to be sure of the diagnosis. How is this treated? Treatment may include making changes to your diet and lifestyle. These changes can help lower your blood sugar and keep you from getting type 2 diabetes. In some cases, medicine may be given to help lower your risk. Follow these instructions at home: Eating and drinking  Eat and drink as told. Follow a healthy meal plan. This includes eating lean proteins, whole grains, legumes, fresh fruits and vegetables, low-fat dairy products, and healthy fats. Meet with an expert in healthy eating called a dietitian. This person can help create a healthy eating plan that's right for you. Lifestyle Do moderate-intensity exercise. Do this for at least 30 minutes a day on 5 or more days each week, or as told by your health care provider. A mix of activities may be best. Good choices include brisk walking, swimming, biking, and weight lifting. Try to lose weight if your provider says it's OK. Losing 5-7% of your body weight can  help reverse insulin resistance. Do not drink alcohol if: Your provider tells you not to drink. You're pregnant, may be pregnant, or plan to become pregnant. If you drink alcohol: Limit how much you have to: 0-1 drink a day if you're female. 0-2 drinks a day if you're female. Know how much alcohol is in your drink. In the [place] one drink is one 12 oz  bottle of beer (355 mL), one 5 oz glass of wine (148 mL), or one 1 oz glass of hard liquor (44 mL). General instructions Take medicines only as told. You may be given medicines that help lower the risk of type 2 diabetes. Do not smoke, vape, or use nicotine or tobacco. Where to find more information American Diabetes Association: diabetes.org/about-diabetes/prediabetes Academy of Nutrition and Dietetics: eatright.org American Heart Association: Go to ThisJobs.cz. Click the search icon. Type prediabetes in the search box. Contact a health care provider if: You have any of these symptoms: Increased hunger. Peeing more often than usual. Increased thirst. Feeling tired. Changes in how you see, like blurry vision. Feeling like you may throw up. Throwing up. Get help right away if: You have shortness of breath. You feel confused. This information is not intended to replace advice given to you by your health care provider. Make sure you discuss any questions you have with your health care provider. Document Revised: 02/20/2023 Document Reviewed: 02/20/2023 Elsevier Patient Education  2024 [employer].

## 2024-01-30 ENCOUNTER — Ambulatory Visit (INDEPENDENT_AMBULATORY_CARE_PROVIDER_SITE_OTHER): Payer: Medicaid Other | Admitting: Nurse Practitioner

## 2024-01-30 ENCOUNTER — Encounter: Payer: Self-pay | Admitting: Nurse Practitioner

## 2024-01-30 VITALS — BP 122/82 | HR 70 | Temp 97.9°F | Ht 68.5 in | Wt 311.8 lb

## 2024-01-30 DIAGNOSIS — M0579 Rheumatoid arthritis with rheumatoid factor of multiple sites without organ or systems involvement: Secondary | ICD-10-CM | POA: Diagnosis not present

## 2024-01-30 DIAGNOSIS — R7303 Prediabetes: Secondary | ICD-10-CM

## 2024-01-30 DIAGNOSIS — G894 Chronic pain syndrome: Secondary | ICD-10-CM | POA: Diagnosis not present

## 2024-01-30 DIAGNOSIS — E66813 Obesity, class 3: Secondary | ICD-10-CM | POA: Diagnosis not present

## 2024-01-30 DIAGNOSIS — R8281 Pyuria: Secondary | ICD-10-CM

## 2024-01-30 DIAGNOSIS — Z6841 Body Mass Index (BMI) 40.0 and over, adult: Secondary | ICD-10-CM

## 2024-01-30 DIAGNOSIS — M0510 Rheumatoid lung disease with rheumatoid arthritis of unspecified site: Secondary | ICD-10-CM | POA: Diagnosis not present

## 2024-01-30 DIAGNOSIS — N898 Other specified noninflammatory disorders of vagina: Secondary | ICD-10-CM

## 2024-01-30 LAB — URINALYSIS, ROUTINE W REFLEX MICROSCOPIC
Bilirubin, UA: NEGATIVE
Glucose, UA: NEGATIVE
Ketones, UA: NEGATIVE
Nitrite, UA: NEGATIVE
Protein,UA: NEGATIVE
RBC, UA: NEGATIVE
Specific Gravity, UA: >1.03 — ABNORMAL HIGH (ref 1.005–1.030)
Urobilinogen, Ur: 0.2 mg/dL (ref 0.2–1.0)
pH, UA: 6 (ref 5.0–7.5)

## 2024-01-30 LAB — WET PREP FOR TRICH, YEAST, CLUE
Clue Cell Exam: NEGATIVE
Trichomonas Exam: NEGATIVE
Yeast Exam: NEGATIVE

## 2024-01-30 LAB — MICROSCOPIC EXAMINATION: Bacteria, UA: NONE SEEN

## 2024-01-30 NOTE — Assessment & Plan Note (Signed)
 Chronic, ongoing.  Followed by rheumatology and pulmonary.  Continue this collaboration.  Recent notes reviewed.

## 2024-01-30 NOTE — Assessment & Plan Note (Signed)
Chronic, ongoing, followed by pain management.  Continue this collaboration and current treatment regimen as ordered by them.  Recent notes reviewed.

## 2024-01-30 NOTE — Assessment & Plan Note (Signed)
 A1c 5.8% in May 2025, remaining similar to previous.  Continue diet monitoring and initiate medication as needed if A1c 6.5% or greater.

## 2024-01-30 NOTE — Assessment & Plan Note (Signed)
 Acute.  Wet prep all negative and UA overall reassuring, will send for culture as well.

## 2024-01-30 NOTE — Assessment & Plan Note (Signed)
 BMI 46.61 with underlying OSA, prediabetes, HTN/HLD, and RA.  Would benefit modest weight loss which we discussed today.  No family history of thyroid  cancer (MTC, MEN 2, thyroid  cell tumors) or pancreatitis.   We will continue to work on getting Wegovy  coverage in future, as weight loss would be beneficial to her overall health.  Recommended eating smaller high protein, low fat meals more frequently and exercising 30 mins a day 5 times a week with a goal of 10-15lb weight loss in the next 3 months. Patient voiced their understanding and motivation to adhere to these recommendations.

## 2024-01-30 NOTE — Assessment & Plan Note (Signed)
 Chronic, ongoing, followed by rheumatology and pain clinic.  Continue these collaborations, recent notes reviewed.  Labs today: up to date.

## 2024-01-30 NOTE — Progress Notes (Signed)
 BP 122/82   Pulse 70   Temp 97.9 F (36.6 C) (Oral)   Ht 5' 8.5 (1.74 m)   Wt (!) 311 lb 12.8 oz (141.4 kg)   SpO2 97%   BMI 46.72 kg/m    Subjective:    Patient ID: Erin Contreras, female    DOB: 12-Feb-1961, 63 y.o.   MRN: [id number]  HPI: Erin MANSEAU is a 63 y.o. female  Chief Complaint  Patient presents with   Hyperlipidemia   Hypertension   Obesity   Prediabetes   Vaginal Itching    Patient states she has been experiencing vaginal itching for the last week and a half   RHEUMATOID ARTHRITIS AND CHRONIC PAIN Taking Actemra and Plaquenil + takes Meloxicam  as needed for pain. This regimen works well.  Continues to take B12 and vitamin D  daily for low levels.  Last saw pain clinic for back injection on 10/12/22.  She is to have a sleep study and plans to call about this. Pain control status: stable Duration: years Location: back, arms, elbows, wrists Quality: dull, aching, and throbbing Current Pain Level: 0/10 Previous Pain Level: 8/10 Breakthrough pain: no Benefit from narcotic medications: unknown What Activities task can be accomplished with current medication? Occasional ADLs Interested in weaning off narcotics:no   Stool softners/OTC fiber: no  Previous pain specialty evaluation: yes Non-narcotic analgesic meds: yes Narcotic contract: yes   VAGINAL DISCHARGE Duration: days Discharge description: mucous  Pruritus: yes Dysuria: no Malodorous: yes Urinary frequency: no Fevers: no Abdominal pain: no  Sexual activity: not sexually active History of sexually transmitted diseases: no Recent antibiotic use: no Context: stable  Treatments attempted: none   Impaired Fasting Glucose HbA1C:  Lab Results  Component Value Date   HGBA1C 5.8 (H) 12/06/2023  Duration of elevated blood sugar: years Polydipsia: no Polyuria: no Weight change: no Visual disturbance: no Glucose Monitoring: no    Accucheck frequency: Not Checking    Fasting glucose:      Post prandial:  Diabetic Education: Not Completed Family history of diabetes: yes   WEIGHT GAIN Does want to lose weight and has tried for >6 months with change in diet and regular activity.  No benefit from this.  She would like to try Wegovy .  No family history of thyroid  cancer (MTC, MEN 2, thyroid  cell tumors) or pancreatitis.  Has OSA and chronic pain with RA that weight loss may be beneficial for. Duration: years Previous attempts at weight loss: yes Complications of obesity: OSA, HTN, HLD, Prediabetes Peak weight: 350 lbs Weight loss goal: 190 lbs Weight loss to date: none Requesting obesity pharmacotherapy: yes Current weight loss supplements/medications: no Previous weight loss supplements/meds: no Calories:  2000  Clinical coverage for weight loss GLP's   Medication being dispensed is Wegovy  3 mL/28 day. Titration doses are 2 mL/28 days.   [x]  Product being prescribed is FDA approved for the indication, age, weight (if applicable) and not does not exceed dosing limits per the Prescribing Information per the clinical conditions for use.  [x]  Patient's baseline weight measured within the last 45 days as required by provider before dispensing.  [x]  Patient is new to therapy and One of the following:   [x]  The beneficiary is 64 years of age or over and has ONE of the following:  [x]  A BMI greater than or equal to 30 kg/m2  []  A BMI greater than or equal to 27 kg/m2 with at least one weight-related comorbidity/risk factor/complication (i.e. hypertension, type  2 diabetes, obstructive sleep apnea, cardiovascular disease, dyslipidemia)  If patient has one weight-related comorbidity/risk factor/complication (i.e. hypertension, type 2 diabetes, obstructive sleep apnea, cardiovascular disease, dyslipidemia), please list OSA, HTN, CAD. Prediabetes.  Patient suffers from weight-related comorbidity/risk factor/complication OSA, HTN, CAD. Prediabetes.  [x]  The beneficiary is  currently on and will continue lifestyle modification including structured nutrition and physical activity, unless physical activity is not clinically appropriate at the time GLP1 therapy commences AND  [x]  The beneficiary will NOT be using the requested agent in combination with another GLP-1 receptor agonist agent AND  [x]  The beneficiary does NOT have any FDA-labeled contraindications to the requested agent, including pregnancy, lactation, history of medullary thyroid  cancer or multiple endocrine neoplasia type II.   Relevant past medical, surgical, family and social history reviewed and updated as indicated. Interim medical history since our last visit reviewed. Allergies and medications reviewed and updated.  Review of Systems  Constitutional:  Negative for activity change, appetite change, diaphoresis, fatigue and unexpected weight change.  Respiratory: Negative.    Cardiovascular: Negative.   Gastrointestinal: Negative.   Genitourinary:  Positive for vaginal discharge. Negative for decreased urine volume, dysuria, frequency, hematuria, pelvic pain and urgency.  Musculoskeletal:  Positive for arthralgias.  Neurological: Negative.   Psychiatric/Behavioral: Negative.      Per HPI unless specifically indicated above     Objective:    BP 122/82   Pulse 70   Temp 97.9 F (36.6 C) (Oral)   Ht 5' 8.5 (1.74 m)   Wt (!) 311 lb 12.8 oz (141.4 kg)   SpO2 97%   BMI 46.72 kg/m   Wt Readings from Last 3 Encounters:  01/30/24 (!) 311 lb 12.8 oz (141.4 kg)  12/06/23 (!) 312 lb (141.5 kg)  11/01/23 (!) 311 lb (141.1 kg)    Physical Exam Vitals and nursing note reviewed.  Constitutional:      General: She is awake. She is not in acute distress.    Appearance: She is well-developed and well-groomed. She is obese. She is not ill-appearing.  HENT:     Head: Normocephalic.     Right Ear: Hearing normal.     Left Ear: Hearing normal.   Eyes:     General: Lids are normal.         Right eye: No discharge.        Left eye: No discharge.     Conjunctiva/sclera: Conjunctivae normal.     Pupils: Pupils are equal, round, and reactive to light.   Neck:     Vascular: No carotid bruit.   Cardiovascular:     Rate and Rhythm: Normal rate and regular rhythm.     Heart sounds: Normal heart sounds. No murmur heard.    No gallop.  Pulmonary:     Effort: Pulmonary effort is normal. No accessory muscle usage or respiratory distress.     Breath sounds: Normal breath sounds.  Abdominal:     General: Bowel sounds are normal. There is no distension.     Palpations: Abdomen is soft.     Tenderness: There is no abdominal tenderness.   Musculoskeletal:     Cervical back: Normal range of motion and neck supple.     Right lower leg: No edema.     Left lower leg: No edema.   Skin:    General: Skin is warm and dry.   Neurological:     Mental Status: She is alert and oriented to person, place, and time.   Psychiatric:  Attention and Perception: Attention normal.        Mood and Affect: Mood normal.        Speech: Speech normal.        Behavior: Behavior normal. Behavior is cooperative.        Thought Content: Thought content normal.     Results for orders placed or performed in visit on 01/30/24  WET PREP FOR TRICH, YEAST, CLUE   Collection Time: 01/30/24 10:32 AM   Specimen: Urine   Urine  Result Value Ref Range   Trichomonas Exam Negative Negative   Yeast Exam Negative Negative   Clue Cell Exam Negative Negative  Microscopic Examination   Collection Time: 01/30/24 10:32 AM   Urine  Result Value Ref Range   WBC, UA 0-5 0 - 5 /hpf   RBC, Urine 0-2 0 - 2 /hpf   Epithelial Cells (non renal) 0-10 0 - 10 /hpf   Mucus, UA Present (A) Not Estab.   Bacteria, UA None seen None seen/Few  Urinalysis, Routine w reflex microscopic   Collection Time: 01/30/24 10:32 AM  Result Value Ref Range   Specific Gravity, UA >1.030 (H) 1.005 - 1.030   pH, UA 6.0 5.0 - 7.5    Color, UA Yellow Yellow   Appearance Ur Clear Clear   Leukocytes,UA 1+ (A) Negative   Protein,UA Negative Negative/Trace   Glucose, UA Negative Negative   Ketones, UA Negative Negative   RBC, UA Negative Negative   Bilirubin, UA Negative Negative   Urobilinogen, Ur 0.2 0.2 - 1.0 mg/dL   Nitrite, UA Negative Negative   Microscopic Examination See below:       Assessment & Plan:   Problem List Items Addressed This Visit       Respiratory   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  Followed by rheumatology and pulmonary.  Continue this collaboration.  Recent notes reviewed.        Musculoskeletal and Integument   Rheumatoid arthritis involving multiple sites with positive rheumatoid factor (HCC) - Primary (Chronic)   Chronic, ongoing, followed by rheumatology and pain clinic.  Continue these collaborations, recent notes reviewed.  Labs today: up to date.        Genitourinary   Vaginal itching   Acute.  Wet prep all negative and UA overall reassuring, will send for culture as well.        Relevant Orders   Urinalysis, Routine w reflex microscopic (Completed)   WET PREP FOR TRICH, YEAST, CLUE (Completed)   Urine Culture     Other   Prediabetes (Chronic)   A1c 5.8% in May 2025, remaining similar to previous.  Continue diet monitoring and initiate medication as needed if A1c 6.5% or greater.      Obesity   BMI 46.61 with underlying OSA, prediabetes, HTN/HLD, and RA.  Would benefit modest weight loss which we discussed today.  No family history of thyroid  cancer (MTC, MEN 2, thyroid  cell tumors) or pancreatitis.   We will continue to work on getting Wegovy  coverage in future, as weight loss would be beneficial to her overall health.  Recommended eating smaller high protein, low fat meals more frequently and exercising 30 mins a day 5 times a week with a goal of 10-15lb weight loss in the next 3 months. Patient voiced their understanding and  motivation to adhere to these recommendations.       Chronic pain syndrome   Chronic, ongoing, followed by pain management.  Continue this collaboration and current treatment regimen as ordered by them.  Recent notes reviewed.        Other Visit Diagnoses       Pyuria       Urine sent for culture.   Relevant Orders   Urine Culture        Follow up plan: Return in about 19 weeks (around 06/11/2024) for RA, HTN/HLD, MOOD, IFG.

## 2024-01-31 DIAGNOSIS — R8281 Pyuria: Secondary | ICD-10-CM | POA: Diagnosis not present

## 2024-01-31 DIAGNOSIS — N898 Other specified noninflammatory disorders of vagina: Secondary | ICD-10-CM | POA: Diagnosis not present

## 2024-02-01 DIAGNOSIS — H25013 Cortical age-related cataract, bilateral: Secondary | ICD-10-CM | POA: Diagnosis not present

## 2024-02-01 DIAGNOSIS — H2513 Age-related nuclear cataract, bilateral: Secondary | ICD-10-CM | POA: Diagnosis not present

## 2024-02-01 DIAGNOSIS — H04123 Dry eye syndrome of bilateral lacrimal glands: Secondary | ICD-10-CM | POA: Diagnosis not present

## 2024-02-02 ENCOUNTER — Other Ambulatory Visit: Payer: Self-pay | Admitting: *Deleted

## 2024-02-02 ENCOUNTER — Telehealth: Payer: Self-pay | Admitting: Nurse Practitioner

## 2024-02-02 LAB — URINE CULTURE

## 2024-02-02 MED ORDER — DULOXETINE HCL 20 MG PO CPEP: 40.0000 mg | ORAL_CAPSULE | Freq: Every morning | ORAL | 2 refills | Status: DC

## 2024-02-02 NOTE — Telephone Encounter (Signed)
 Patient states she need refill on Duloxetine . She did not get refills on last MM appt.

## 2024-02-02 NOTE — Telephone Encounter (Signed)
 Rx request sent to S. Tobie, NP.

## 2024-02-13 ENCOUNTER — Encounter: Attending: Pulmonary Disease

## 2024-02-13 ENCOUNTER — Other Ambulatory Visit: Payer: Self-pay

## 2024-02-13 DIAGNOSIS — J849 Interstitial pulmonary disease, unspecified: Secondary | ICD-10-CM | POA: Insufficient documentation

## 2024-02-13 DIAGNOSIS — R0602 Shortness of breath: Secondary | ICD-10-CM | POA: Insufficient documentation

## 2024-02-13 NOTE — Progress Notes (Signed)
 Initial phone call completed. Diagnosis can be found in [facility] 01/20/2024. EP Orientation scheduled for February 16, 2024 @ 0800.

## 2024-02-16 ENCOUNTER — Encounter

## 2024-02-16 VITALS — Ht 69.5 in | Wt 308.8 lb

## 2024-02-16 DIAGNOSIS — J849 Interstitial pulmonary disease, unspecified: Secondary | ICD-10-CM

## 2024-02-16 DIAGNOSIS — R0602 Shortness of breath: Secondary | ICD-10-CM

## 2024-02-16 NOTE — Progress Notes (Signed)
 Pulmonary Individual Treatment Plan  Patient Details  Name: Erin Contreras MRN: [id number] Date of Birth: [birth year] Referring Provider:   Flowsheet Row Pulmonary Rehab from 02/16/2024 in [facility] Pulmonary Rehab  Referring Provider Brena Reusing, MD    Initial Encounter Date:  Flowsheet Row Pulmonary Rehab from 02/16/2024 in [facility] Cardiac and Pulmonary Rehab  Date 02/16/24    Visit Diagnosis: Interstitial lung disease (HCC)  Shortness of breath  Patient's Home Medications on Admission:  Current Outpatient Medications:    acyclovir  (ZOVIRAX ) 400 MG tablet, Take 1 tablet (400 mg total) by mouth 2 (two) times daily as needed., Disp: 180 tablet, Rfl: 4   Cholecalciferol  1.25 MG (50000 UT) TABS, Take 1 tablet by mouth once a week., Disp: 12 tablet, Rfl: 4   DULoxetine  (CYMBALTA ) 20 MG capsule, Take 2 capsules (40 mg total) by mouth every morning., Disp: 60 capsule, Rfl: 2   famotidine  (PEPCID ) 40 MG tablet, Take 1 tablet (40 mg total) by mouth at bedtime., Disp: 90 tablet, Rfl: 3   folic acid  (FOLVITE ) 1 MG tablet, Take 1 mg by mouth daily., Disp: , Rfl:    hydroxychloroquine (PLAQUENIL) 200 MG tablet, Take 200 mg by mouth 2 (two) times daily., Disp: , Rfl:    meloxicam  (MOBIC ) 15 MG tablet, Take 15 mg by mouth daily., Disp: , Rfl:    methotrexate  (RHEUMATREX) 2.5 MG tablet, Take 20 mg by mouth once a week., Disp: , Rfl:    pantoprazole  (PROTONIX ) 40 MG tablet, Take 1 tablet (40 mg total) by mouth 2 (two) times daily., Disp: 180 tablet, Rfl: 3   pregabalin  (LYRICA ) 150 MG capsule, Take 1 capsule (150 mg total) by mouth at bedtime., Disp: 30 capsule, Rfl: 5   Tocilizumab (ACTEMRA ACTPEN) 162 MG/0.9ML SOAJ, Inject into the skin., Disp: , Rfl:    vitamin B-12 (CYANOCOBALAMIN ) 100 MCG tablet, Take 100 mcg by mouth daily., Disp: , Rfl:   Past Medical History: Past Medical History:  Diagnosis Date   Boils    Ear mass    GERD (gastroesophageal reflux disease)    Hemorrhoids     Herpes simplex virus infection    HSV infection    Lumbago with sciatica    OA (osteoarthritis) of knee    Obesity    Overactive bladder    PONV (postoperative nausea and vomiting)    Pre-diabetes    RA (rheumatoid arthritis) (HCC)    Sleep apnea    does not use cpap-had bariatric surgery and has no issues since surgery   Urge and stress incontinence     Tobacco Use: Social History   Tobacco Use  Smoking Status Never  Smokeless Tobacco Never    Labs: Review Flowsheet  More data exists      Latest Ref Rng & Units 04/09/2022 10/08/2022 01/25/2023 07/28/2023 12/06/2023  Labs for ITP Cardiac and Pulmonary Rehab  Cholestrol 100 - 199 mg/dL 757  792  815  813  771   LDL (calc) 0 - 99 mg/dL 859  883  898  891  857   HDL-C >39 mg/dL 78  65  61  60  62   Trlycerides 0 - 149 mg/dL 859  848  874  899  863   Hemoglobin A1c 4.8 - 5.6 % 6.2  6.2  6.3  6.1  5.8      Pulmonary Assessment Scores:  Pulmonary Assessment Scores     Row Name 02/16/24 1025  ADL UCSD   ADL Phase Entry     SOB Score total 62     Rest 3     Walk 2     Stairs 2     Bath 2     Dress 2     Shop 3       CAT Score   CAT Score 11       mMRC Score   mMRC Score 4        UCSD: Self-administered rating of dyspnea associated with activities of daily living (ADLs) 6-point scale (0 = not at all to 5 = maximal or unable to do because of breathlessness)  Scoring Scores range from 0 to 120.  Minimally important difference is 5 units  CAT: CAT can identify the health impairment of COPD patients and is better correlated with disease progression.  CAT has a scoring range of zero to 40. The CAT score is classified into four groups of low (less than 10), medium (10 - 20), high (21-30) and very high (31-40) based on the impact level of disease on health status. A CAT score over 10 suggests significant symptoms.  A worsening CAT score could be explained by an exacerbation, poor medication adherence, poor  inhaler technique, or progression of COPD or comorbid conditions.  CAT MCID is 2 points  mMRC: mMRC (Modified Medical Research Council) Dyspnea Scale is used to assess the degree of baseline functional disability in patients of respiratory disease due to dyspnea. No minimal important difference is established. A decrease in score of 1 point or greater is considered a positive change.   Pulmonary Function Assessment:   Exercise Target Goals: Exercise Program Goal: Individual exercise prescription set using results from initial 6 min walk test and THRR while considering  patient's activity barriers and safety.   Exercise Prescription Goal: Initial exercise prescription builds to 30-45 minutes a day of aerobic activity, 2-3 days per week.  Home exercise guidelines will be given to patient during program as part of exercise prescription that the participant will acknowledge.  Education: Aerobic Exercise: - Group verbal and visual presentation on the components of exercise prescription. Introduces F.I.T.T principle from ACSM for exercise prescriptions.  Reviews F.I.T.T. principles of aerobic exercise including progression. Written material given at graduation.   Education: Resistance Exercise: - Group verbal and visual presentation on the components of exercise prescription. Introduces F.I.T.T principle from ACSM for exercise prescriptions  Reviews F.I.T.T. principles of resistance exercise including progression. Written material given at graduation.    Education: Exercise & Equipment Safety: - Individual verbal instruction and demonstration of equipment use and safety with use of the equipment. Flowsheet Row Pulmonary Rehab from 02/16/2024 in [facility] Cardiac and Pulmonary Rehab  Date 02/16/24  Educator MB  Instruction Review Code 1- Verbalizes Understanding    Education: Exercise Physiology & General Exercise Guidelines: - Group verbal and written instruction with models to review the  exercise physiology of the cardiovascular system and associated critical values. Provides general exercise guidelines with specific guidelines to those with heart or lung disease.    Education: Flexibility, Balance, Mind/Body Relaxation: - Group verbal and visual presentation with interactive activity on the components of exercise prescription. Introduces F.I.T.T principle from ACSM for exercise prescriptions. Reviews F.I.T.T. principles of flexibility and balance exercise training including progression. Also discusses the mind body connection.  Reviews various relaxation techniques to help reduce and manage stress (i.e. Deep breathing, progressive muscle relaxation, and visualization). Balance handout provided to take home. Written material  given at graduation.   Activity Barriers & Risk Stratification:  Activity Barriers & Cardiac Risk Stratification - 02/16/24 1016       Activity Barriers & Cardiac Risk Stratification   Activity Barriers Arthritis;Joint Problems;Shortness of Breath;History of Falls;Left Knee Replacement;Right Knee Replacement          6 Minute Walk:  6 Minute Walk     Row Name 02/16/24 1015         6 Minute Walk   Phase Initial     Distance 1100 feet     Walk Time 6 minutes     # of Rest Breaks 0     MPH 2.08     METS 2.34     RPE 11     Perceived Dyspnea  0     VO2 Peak 8.19     Symptoms No     Resting HR 78 bpm     Resting BP 126/80     Resting Oxygen Saturation  93 %     Exercise Oxygen Saturation  during 6 min walk 90 %     Max Ex. HR 125 bpm     Max Ex. BP 160/80     2 Minute Post BP 118/80       Interval HR   1 Minute HR 90     2 Minute HR 106     3 Minute HR 125     4 Minute HR 108     5 Minute HR 101     6 Minute HR 107     2 Minute Post HR 88     Interval Heart Rate? Yes       Interval Oxygen   Interval Oxygen? Yes     Baseline Oxygen Saturation % 93 %     1 Minute Oxygen Saturation % 93 %     1 Minute Liters of Oxygen 0 L     2  Minute Oxygen Saturation % 91 %     2 Minute Liters of Oxygen 0 L     3 Minute Oxygen Saturation % 91 %     3 Minute Liters of Oxygen 0 L     4 Minute Oxygen Saturation % 91 %     4 Minute Liters of Oxygen 0 L     5 Minute Oxygen Saturation % 90 %     5 Minute Liters of Oxygen 0 L     6 Minute Oxygen Saturation % 90 %     6 Minute Liters of Oxygen 0 L     2 Minute Post Oxygen Saturation % 94 %     2 Minute Post Liters of Oxygen 0 L       Oxygen Initial Assessment:  Oxygen Initial Assessment - 02/13/24 1346       Home Oxygen   Home Oxygen Device None    Sleep Oxygen Prescription CPAP   not using,has an appt scheduled to be fitted for new CPAP   Home Exercise Oxygen Prescription None    Home Resting Oxygen Prescription None      Intervention   Short Term Goals To learn and exhibit compliance with exercise, home and travel O2 prescription;To learn and understand importance of monitoring SPO2 with pulse oximeter and demonstrate accurate use of the pulse oximeter.;To learn and understand importance of maintaining oxygen saturations>88%;To learn and demonstrate proper pursed lip breathing techniques or other breathing techniques. ;To learn and demonstrate proper use of respiratory medications  Long  Term Goals Exhibits compliance with exercise, home  and travel O2 prescription;Verbalizes importance of monitoring SPO2 with pulse oximeter and return demonstration;Maintenance of O2 saturations>88%;Exhibits proper breathing techniques, such as pursed lip breathing or other method taught during program session;Compliance with respiratory medication          Oxygen Re-Evaluation:   Oxygen Discharge (Final Oxygen Re-Evaluation):   Initial Exercise Prescription:  Initial Exercise Prescription - 02/16/24 1000       Date of Initial Exercise RX and Referring Provider   Date 02/16/24    Referring Provider Brena Reusing, MD      Oxygen   Maintain Oxygen Saturation 88% or higher       Treadmill   MPH 1.8   Try   Grade 0    Minutes 15    METs 2.38      Recumbant Bike   Level 2    RPM 50    Watts 25    Minutes 15    METs 2.34      REL-XR   Level 1    Speed 50    Minutes 15    METs 2.34      T5 Nustep   Level 2    SPM 80    Minutes 15    METs 2.34      Track   Laps 25    Minutes 15    METs 2.36      Prescription Details   Frequency (times per week) 2    Duration Progress to 30 minutes of continuous aerobic without signs/symptoms of physical distress      Intensity   THRR 40-80% of Max Heartrate 109-140    Ratings of Perceived Exertion 11-13    Perceived Dyspnea 0-4      Progression   Progression Continue to progress workloads to maintain intensity without signs/symptoms of physical distress.      Resistance Training   Training Prescription Yes    Weight 4 lb    Reps 10-15          Perform Capillary Blood Glucose checks as needed.  Exercise Prescription Changes:   Exercise Prescription Changes     Row Name 02/16/24 1000             Response to Exercise   Blood Pressure (Admit) 126/80       Blood Pressure (Exercise) 160/80       Blood Pressure (Exit) 118/80       Heart Rate (Admit) 78 bpm       Heart Rate (Exercise) 125 bpm       Heart Rate (Exit) 81 bpm       Oxygen Saturation (Admit) 93 %       Oxygen Saturation (Exercise) 90 %       Oxygen Saturation (Exit) 94 %       Rating of Perceived Exertion (Exercise) 11       Perceived Dyspnea (Exercise) 0       Symptoms none       Comments results       Intensity THRR New         Progression   Average METs 2.34          Exercise Comments:   Exercise Goals and Review:   Exercise Goals     Row Name 02/16/24 1020             Exercise Goals   Increase Physical Activity Yes  Intervention Provide advice, education, support and counseling about physical activity/exercise needs.;Develop an individualized exercise prescription for aerobic and resistive  training based on initial evaluation findings, risk stratification, comorbidities and participant's personal goals.       Expected Outcomes Short Term: Attend rehab on a regular basis to increase amount of physical activity.;Long Term: Add in home exercise to make exercise part of routine and to increase amount of physical activity.;Long Term: Exercising regularly at least 3-5 days a week.       Increase Strength and Stamina Yes       Intervention Provide advice, education, support and counseling about physical activity/exercise needs.;Develop an individualized exercise prescription for aerobic and resistive training based on initial evaluation findings, risk stratification, comorbidities and participant's personal goals.       Expected Outcomes Short Term: Increase workloads from initial exercise prescription for resistance, speed, and METs.;Short Term: Perform resistance training exercises routinely during rehab and add in resistance training at home;Long Term: Improve cardiorespiratory fitness, muscular endurance and strength as measured by increased METs and functional capacity ( )       Able to understand and use rate of perceived exertion (RPE) scale Yes       Intervention Provide education and explanation on how to use RPE scale       Expected Outcomes Short Term: Able to use RPE daily in rehab to express subjective intensity level;Long Term:  Able to use RPE to guide intensity level when exercising independently       Able to understand and use Dyspnea scale Yes       Intervention Provide education and explanation on how to use Dyspnea scale       Expected Outcomes Short Term: Able to use Dyspnea scale daily in rehab to express subjective sense of shortness of breath during exertion;Long Term: Able to use Dyspnea scale to guide intensity level when exercising independently       Knowledge and understanding of Target Heart Rate Range (THRR) Yes       Intervention Provide education and  explanation of THRR including how the numbers were predicted and where they are located for reference       Expected Outcomes Short Term: Able to state/look up THRR;Short Term: Able to use daily as guideline for intensity in rehab;Long Term: Able to use THRR to govern intensity when exercising independently       Able to check pulse independently Yes       Intervention Provide education and demonstration on how to check pulse in carotid and radial arteries.;Review the importance of being able to check your own pulse for safety during independent exercise       Expected Outcomes Short Term: Able to explain why pulse checking is important during independent exercise;Long Term: Able to check pulse independently and accurately       Understanding of Exercise Prescription Yes       Intervention Provide education, explanation, and written materials on patient's individual exercise prescription       Expected Outcomes Short Term: Able to explain program exercise prescription;Long Term: Able to explain home exercise prescription to exercise independently          Exercise Goals Re-Evaluation :   Discharge Exercise Prescription (Final Exercise Prescription Changes):  Exercise Prescription Changes - 02/16/24 1000       Response to Exercise   Blood Pressure (Admit) 126/80    Blood Pressure (Exercise) 160/80    Blood Pressure (Exit) 118/80    Heart  Rate (Admit) 78 bpm    Heart Rate (Exercise) 125 bpm    Heart Rate (Exit) 81 bpm    Oxygen Saturation (Admit) 93 %    Oxygen Saturation (Exercise) 90 %    Oxygen Saturation (Exit) 94 %    Rating of Perceived Exertion (Exercise) 11    Perceived Dyspnea (Exercise) 0    Symptoms none    Comments results    Intensity THRR New      Progression   Average METs 2.34          Nutrition:  Target Goals: Understanding of nutrition guidelines, daily intake of sodium 1500mg , cholesterol 200mg , calories 30% from fat and 7% or less from saturated  fats, daily to have 5 or more servings of fruits and vegetables.  Education: All About Nutrition: -Group instruction provided by verbal, written material, interactive activities, discussions, models, and posters to present general guidelines for heart healthy nutrition including fat, fiber, MyPlate, the role of sodium in heart healthy nutrition, utilization of the nutrition label, and utilization of this knowledge for meal planning. Follow up email sent as well. Written material given at graduation.   Biometrics:  Pre Biometrics - 02/16/24 1024       Pre Biometrics   Height 5' 9.5 (1.765 m)    Weight 308 lb 12.8 oz (140.1 kg)    Waist Circumference 53.9 inches    Hip Circumference 62 inches    Waist to Hip Ratio 0.87 %    BMI (Calculated) 44.96    Single Leg Stand 12.5 seconds           Nutrition Therapy Plan and Nutrition Goals:  Nutrition Therapy & Goals - 02/16/24 1026       Personal Nutrition Goals   Nutrition Goal Will meet w/ RD on 7/28      Intervention Plan   Intervention Prescribe, educate and counsel regarding individualized specific dietary modifications aiming towards targeted core components such as weight, hypertension, lipid management, diabetes, heart failure and other comorbidities.;Nutrition handout(s) given to patient.    Expected Outcomes Short Term Goal: Understand basic principles of dietary content, such as calories, fat, sodium, cholesterol and nutrients.;Short Term Goal: A plan has been developed with personal nutrition goals set during dietitian appointment.;Long Term Goal: Adherence to prescribed nutrition plan.          Nutrition Assessments:  MEDIFICTS Score Key: >=70 Need to make dietary changes  40-70 Heart Healthy Diet <= 40 Therapeutic Level Cholesterol Diet   Picture Your Plate Scores: <59 Unhealthy dietary pattern with much room for improvement. 41-50 Dietary pattern unlikely to meet recommendations for good health and room for  improvement. 51-60 More healthful dietary pattern, with some room for improvement.  >60 Healthy dietary pattern, although there may be some specific behaviors that could be improved.   Nutrition Goals Re-Evaluation:   Nutrition Goals Discharge (Final Nutrition Goals Re-Evaluation):   Psychosocial: Target Goals: Acknowledge presence or absence of significant depression and/or stress, maximize coping skills, provide positive support system. Participant is able to verbalize types and ability to use techniques and skills needed for reducing stress and depression.   Education: Stress, Anxiety, and Depression - Group verbal and visual presentation to define topics covered.  Reviews how body is impacted by stress, anxiety, and depression.  Also discusses healthy ways to reduce stress and to treat/manage anxiety and depression.  Written material given at graduation.   Education: Sleep Hygiene -Provides group verbal and written instruction about how sleep can  affect your health.  Define sleep hygiene, discuss sleep cycles and impact of sleep habits. Review good sleep hygiene tips.    Initial Review & Psychosocial Screening:  Initial Psych Review & Screening - 02/13/24 1350       Initial Review   Current issues with History of Depression;Current Stress Concerns    Source of Stress Concerns Chronic Illness    Comments --      Family Dynamics   Good Support System? Yes      Barriers   Psychosocial barriers to participate in program There are no identifiable barriers or psychosocial needs.;The patient should benefit from training in stress management and relaxation.      Screening Interventions   Interventions Encouraged to exercise    Expected Outcomes Short Term goal: Identification and review with participant of any Quality of Life or Depression concerns found by scoring the questionnaire.;Long Term goal: The participant improves quality of Life and PHQ9 Scores as seen by post scores  and/or verbalization of changes          Quality of Life Scores:  Scores of 19 and below usually indicate a poorer quality of life in these areas.  A difference of  2-3 points is a clinically meaningful difference.  A difference of 2-3 points in the total score of the Quality of Life Index has been associated with significant improvement in overall quality of life, self-image, physical symptoms, and general health in studies assessing change in quality of life.  PHQ-9: Review Flowsheet  More data exists      02/16/2024 01/30/2024 12/06/2023 11/01/2023 07/28/2023  Depression screen PHQ 2/9  Decreased Interest 1 0 0 1 0  Down, Depressed, Hopeless 0 0 0 0 1  PHQ - 2 Score 1 0 0 1 1  Altered sleeping 2 2 3  - 1  Tired, decreased energy 3 3 2  - 1  Change in appetite 0 0 0 - 0  Feeling bad or failure about yourself  0 0 0 - 0  Trouble concentrating 0 0 0 - 0  Moving slowly or fidgety/restless 0 1 0 - 0  Suicidal thoughts 0 0 0 - 0  PHQ-9 Score 6 6 5  - 3  Difficult doing work/chores Not difficult at all Not difficult at all Not difficult at all - Somewhat difficult   Interpretation of Total Score  Total Score Depression Severity:  1-4 = Minimal depression, 5-9 = Mild depression, 10-14 = Moderate depression, 15-19 = Moderately severe depression, 20-27 = Severe depression   Psychosocial Evaluation and Intervention:  Psychosocial Evaluation - 02/13/24 1400       Psychosocial Evaluation & Interventions   Interventions Encouraged to exercise with the program and follow exercise prescription    Comments Patient stated that she has had occasional periods of depression, however does not take medication specifically for that. Although, patient is prescribed an anti-depressant that is also used to help manage chronic pain. Patient stated she does have some stress occasionally related to regular everyday life occurrences, but nothing specific and not all of the time. Patient stated she has a good  support system of family and friends. Patient is interested in improving her shortness of breath and learning ways to improve her health and weight through lifestyle changes such as diet and exercise.    Expected Outcomes Short Term: Attend pulmonary rehab for exercise and education. Long Term: Develop and maintain positive healthcare habits.    Continue Psychosocial Services  Follow up required by staff  Psychosocial Re-Evaluation:   Psychosocial Discharge (Final Psychosocial Re-Evaluation):   Education: Education Goals: Education classes will be provided on a weekly basis, covering required topics. Participant will state understanding/return demonstration of topics presented.  Learning Barriers/Preferences:  Learning Barriers/Preferences - 02/13/24 1349       Learning Barriers/Preferences   Learning Barriers None    Learning Preferences Video;Verbal Instruction;Individual Instruction;Group Instruction;Computer/Internet;Audio;Written Material          General Pulmonary Education Topics:  Infection Prevention: - Provides verbal and written material to individual with discussion of infection control including proper hand washing and proper equipment cleaning during exercise session. Flowsheet Row Pulmonary Rehab from 02/16/2024 in [facility] Cardiac and Pulmonary Rehab  Date 02/16/24  Educator MB  Instruction Review Code 1- Verbalizes Understanding    Falls Prevention: - Provides verbal and written material to individual with discussion of falls prevention and safety. Flowsheet Row Pulmonary Rehab from 02/16/2024 in [facility] Cardiac and Pulmonary Rehab  Date 02/16/24  Educator MB  Instruction Review Code 1- Verbalizes Understanding    Chronic Lung Disease Review: - Group verbal instruction with posters, models, PowerPoint presentations and videos,  to review new updates, new respiratory medications, new advancements in procedures and treatments. Providing information on  websites and 800 numbers for continued self-education. Includes information about supplement oxygen, available portable oxygen systems, continuous and intermittent flow rates, oxygen safety, concentrators, and Medicare reimbursement for oxygen. Explanation of Pulmonary Drugs, including class, frequency, complications, importance of spacers, rinsing mouth after steroid MDI's, and proper cleaning methods for nebulizers. Review of basic lung anatomy and physiology related to function, structure, and complications of lung disease. Review of risk factors. Discussion about methods for diagnosing sleep apnea and types of masks and machines for OSA. Includes a review of the use of types of environmental controls: home humidity, furnaces, filters, dust mite/pet prevention, HEPA vacuums. Discussion about weather changes, air quality and the benefits of nasal washing. Instruction on Warning signs, infection symptoms, calling MD promptly, preventive modes, and value of vaccinations. Review of effective airway clearance, coughing and/or vibration techniques. Emphasizing that all should Create an Action Plan. Written material given at graduation.   AED/CPR: - Group verbal and written instruction with the use of models to demonstrate the basic use of the AED with the basic ABC's of resuscitation.    Anatomy and Cardiac Procedures: - Group verbal and visual presentation and models provide information about basic cardiac anatomy and function. Reviews the testing methods done to diagnose heart disease and the outcomes of the test results. Describes the treatment choices: Medical Management, Angioplasty, or Coronary Bypass Surgery for treating various heart conditions including Myocardial Infarction, Angina, Valve Disease, and Cardiac Arrhythmias.  Written material given at graduation.   Medication Safety: - Group verbal and visual instruction to review commonly prescribed medications for heart and lung disease. Reviews  the medication, class of the drug, and side effects. Includes the steps to properly store meds and maintain the prescription regimen.  Written material given at graduation.   Other: -Provides group and verbal instruction on various topics (see comments)   Knowledge Questionnaire Score:    Core Components/Risk Factors/Patient Goals at Admission:  Personal Goals and Risk Factors at Admission - 02/16/24 1026       Core Components/Risk Factors/Patient Goals on Admission    Weight Management Yes;Obesity;Weight Loss    Intervention Weight Management: Provide education and appropriate resources to help participant work on and attain dietary goals.;Weight Management/Obesity: Establish reasonable short term and long term weight  goals.;Obesity: Provide education and appropriate resources to help participant work on and attain dietary goals.;Weight Management: Develop a combined nutrition and exercise program designed to reach desired caloric intake, while maintaining appropriate intake of nutrient and fiber, sodium and fats, and appropriate energy expenditure required for the weight goal.    Admit Weight 308 lb 12.8 oz (140.1 kg)    Goal Weight: Short Term 231 lb 12.8 oz (105.1 kg)    Goal Weight: Long Term 154 lb 6.4 oz (70 kg)    Expected Outcomes Short Term: Continue to assess and modify interventions until short term weight is achieved;Long Term: Adherence to nutrition and physical activity/exercise program aimed toward attainment of established weight goal;Weight Loss: Understanding of general recommendations for a balanced deficit meal plan, which promotes 1-2 lb weight loss per week and includes a negative energy balance of [phone] kcal/d;Understanding recommendations for meals to include 15-35% energy as protein, 25-35% energy from fat, 35-60% energy from carbohydrates, less than 200mg  of dietary cholesterol, 20-35 gm of total fiber daily;Understanding of distribution of calorie intake throughout  the day with the consumption of 4-5 meals/snacks    Improve shortness of breath with ADL's Yes    Intervention Provide education, individualized exercise plan and daily activity instruction to help decrease symptoms of SOB with activities of daily living.    Expected Outcomes Short Term: Improve cardiorespiratory fitness to achieve a reduction of symptoms when performing ADLs;Long Term: Be able to perform more ADLs without symptoms or delay the onset of symptoms    Hypertension Yes    Intervention Provide education on lifestyle modifcations including regular physical activity/exercise, weight management, moderate sodium restriction and increased consumption of fresh fruit, vegetables, and low fat dairy, alcohol moderation, and smoking cessation.    Expected Outcomes Short Term: Continued assessment and intervention until BP is < 140/76mm HG in hypertensive participants. < 130/37mm HG in hypertensive participants with diabetes, heart failure or chronic kidney disease.;Long Term: Maintenance of blood pressure at goal levels.    Lipids Yes    Intervention Provide education and support for participant on nutrition & aerobic/resistive exercise along with prescribed medications to achieve LDL 70mg , HDL >40mg .    Expected Outcomes Short Term: Participant states understanding of desired cholesterol values and is compliant with medications prescribed. Participant is following exercise prescription and nutrition guidelines.;Long Term: Cholesterol controlled with medications as prescribed, with individualized exercise RX and with personalized nutrition plan. Value goals: LDL < 70mg , HDL > 40 mg.          Education:Diabetes - Individual verbal and written instruction to review signs/symptoms of diabetes, desired ranges of glucose level fasting, after meals and with exercise. Acknowledge that pre and post exercise glucose checks will be done for 3 sessions at entry of program.   Know Your Numbers and Heart  Failure: - Group verbal and visual instruction to discuss disease risk factors for cardiac and pulmonary disease and treatment options.  Reviews associated critical values for Overweight/Obesity, Hypertension, Cholesterol, and Diabetes.  Discusses basics of heart failure: signs/symptoms and treatments.  Introduces Heart Failure Zone chart for action plan for heart failure.  Written material given at graduation.   Core Components/Risk Factors/Patient Goals Review:    Core Components/Risk Factors/Patient Goals at Discharge (Final Review):    ITP Comments:  ITP Comments     Row Name 02/13/24 1402 02/16/24 1014         ITP Comments Initial phone call completed. Diagnosis can be found in [facility] 01/20/2024. EP Orientation scheduled  for February 16, 2024 @ 0800. Completed and gym orientation for respiratory care services. Initial ITP created and sent for review to Dr. Faud Aleskerov, Medical Director.         Comments: Initial ITP

## 2024-02-16 NOTE — Patient Instructions (Signed)
 Patient Instructions  Patient Details  Name: Erin Contreras MRN: [id number] Date of Birth: 1961/01/07 Referring Provider:  Brena Wanda CROME, *  Below are your personal goals for exercise, nutrition, and risk factors. Our goal is to help you stay on track towards obtaining and maintaining these goals. We will be discussing your progress on these goals with you throughout the program.  Initial Exercise Prescription:  Initial Exercise Prescription - 02/16/24 1000       Date of Initial Exercise RX and Referring Provider   Date 02/16/24    Referring Provider Brena Wanda, MD      Oxygen   Maintain Oxygen Saturation 88% or higher      Treadmill   MPH 1.8   Try   Grade 0    Minutes 15    METs 2.38      Recumbant Bike   Level 2    RPM 50    Watts 25    Minutes 15    METs 2.34      REL-XR   Level 1    Speed 50    Minutes 15    METs 2.34      T5 Nustep   Level 2    SPM 80    Minutes 15    METs 2.34      Track   Laps 25    Minutes 15    METs 2.36      Prescription Details   Frequency (times per week) 2    Duration Progress to 30 minutes of continuous aerobic without signs/symptoms of physical distress      Intensity   THRR 40-80% of Max Heartrate 109-140    Ratings of Perceived Exertion 11-13    Perceived Dyspnea 0-4      Progression   Progression Continue to progress workloads to maintain intensity without signs/symptoms of physical distress.      Resistance Training   Training Prescription Yes    Weight 4 lb    Reps 10-15          Exercise Goals: Frequency: Be able to perform aerobic exercise two to three times per week in program working toward 2-5 days per week of home exercise.  Intensity: Work with a perceived exertion of 11 (fairly light) - 15 (hard) while following your exercise prescription.  We will make changes to your prescription with you as you progress through the program.   Duration: Be able to do 30 to 45 minutes of  continuous aerobic exercise in addition to a 5 minute warm-up and a 5 minute cool-down routine.   Nutrition Goals: Your personal nutrition goals will be established when you do your nutrition analysis with the dietician.  The following are general nutrition guidelines to follow: Cholesterol < 200mg /day Sodium < 1500mg /day Fiber: Women over 50 yrs - 21 grams per day  Personal Goals:  Personal Goals and Risk Factors at Admission - 02/16/24 1026       Core Components/Risk Factors/Patient Goals on Admission    Weight Management Yes;Obesity;Weight Loss    Intervention Weight Management: Provide education and appropriate resources to help participant work on and attain dietary goals.;Weight Management/Obesity: Establish reasonable short term and long term weight goals.;Obesity: Provide education and appropriate resources to help participant work on and attain dietary goals.;Weight Management: Develop a combined nutrition and exercise program designed to reach desired caloric intake, while maintaining appropriate intake of nutrient and fiber, sodium and fats, and appropriate energy expenditure required for the weight  goal.    Admit Weight 308 lb 12.8 oz (140.1 kg)    Goal Weight: Short Term 231 lb 12.8 oz (105.1 kg)    Goal Weight: Long Term 154 lb 6.4 oz (70 kg)    Expected Outcomes Short Term: Continue to assess and modify interventions until short term weight is achieved;Long Term: Adherence to nutrition and physical activity/exercise program aimed toward attainment of established weight goal;Weight Loss: Understanding of general recommendations for a balanced deficit meal plan, which promotes 1-2 lb weight loss per week and includes a negative energy balance of [phone] kcal/d;Understanding recommendations for meals to include 15-35% energy as protein, 25-35% energy from fat, 35-60% energy from carbohydrates, less than 200mg  of dietary cholesterol, 20-35 gm of total fiber daily;Understanding of  distribution of calorie intake throughout the day with the consumption of 4-5 meals/snacks    Improve shortness of breath with ADL's Yes    Intervention Provide education, individualized exercise plan and daily activity instruction to help decrease symptoms of SOB with activities of daily living.    Expected Outcomes Short Term: Improve cardiorespiratory fitness to achieve a reduction of symptoms when performing ADLs;Long Term: Be able to perform more ADLs without symptoms or delay the onset of symptoms    Hypertension Yes    Intervention Provide education on lifestyle modifcations including regular physical activity/exercise, weight management, moderate sodium restriction and increased consumption of fresh fruit, vegetables, and low fat dairy, alcohol moderation, and smoking cessation.    Expected Outcomes Short Term: Continued assessment and intervention until BP is < 140/91mm HG in hypertensive participants. < 130/60mm HG in hypertensive participants with diabetes, heart failure or chronic kidney disease.;Long Term: Maintenance of blood pressure at goal levels.    Lipids Yes    Intervention Provide education and support for participant on nutrition & aerobic/resistive exercise along with prescribed medications to achieve LDL 70mg , HDL >40mg .    Expected Outcomes Short Term: Participant states understanding of desired cholesterol values and is compliant with medications prescribed. Participant is following exercise prescription and nutrition guidelines.;Long Term: Cholesterol controlled with medications as prescribed, with individualized exercise RX and with personalized nutrition plan. Value goals: LDL < 70mg , HDL > 40 mg.          Tobacco Use Initial Evaluation: Social History   Tobacco Use  Smoking Status Never  Smokeless Tobacco Never    Exercise Goals and Review:  Exercise Goals     Row Name 02/16/24 1020             Exercise Goals   Increase Physical Activity Yes        Intervention Provide advice, education, support and counseling about physical activity/exercise needs.;Develop an individualized exercise prescription for aerobic and resistive training based on initial evaluation findings, risk stratification, comorbidities and participant's personal goals.       Expected Outcomes Short Term: Attend rehab on a regular basis to increase amount of physical activity.;Long Term: Add in home exercise to make exercise part of routine and to increase amount of physical activity.;Long Term: Exercising regularly at least 3-5 days a week.       Increase Strength and Stamina Yes       Intervention Provide advice, education, support and counseling about physical activity/exercise needs.;Develop an individualized exercise prescription for aerobic and resistive training based on initial evaluation findings, risk stratification, comorbidities and participant's personal goals.       Expected Outcomes Short Term: Increase workloads from initial exercise prescription for resistance, speed, and METs.;Short Term:  Perform resistance training exercises routinely during rehab and add in resistance training at home;Long Term: Improve cardiorespiratory fitness, muscular endurance and strength as measured by increased METs and functional capacity ( )       Able to understand and use rate of perceived exertion (RPE) scale Yes       Intervention Provide education and explanation on how to use RPE scale       Expected Outcomes Short Term: Able to use RPE daily in rehab to express subjective intensity level;Long Term:  Able to use RPE to guide intensity level when exercising independently       Able to understand and use Dyspnea scale Yes       Intervention Provide education and explanation on how to use Dyspnea scale       Expected Outcomes Short Term: Able to use Dyspnea scale daily in rehab to express subjective sense of shortness of breath during exertion;Long Term: Able to use Dyspnea scale to  guide intensity level when exercising independently       Knowledge and understanding of Target Heart Rate Range (THRR) Yes       Intervention Provide education and explanation of THRR including how the numbers were predicted and where they are located for reference       Expected Outcomes Short Term: Able to state/look up THRR;Short Term: Able to use daily as guideline for intensity in rehab;Long Term: Able to use THRR to govern intensity when exercising independently       Able to check pulse independently Yes       Intervention Provide education and demonstration on how to check pulse in carotid and radial arteries.;Review the importance of being able to check your own pulse for safety during independent exercise       Expected Outcomes Short Term: Able to explain why pulse checking is important during independent exercise;Long Term: Able to check pulse independently and accurately       Understanding of Exercise Prescription Yes       Intervention Provide education, explanation, and written materials on patient's individual exercise prescription       Expected Outcomes Short Term: Able to explain program exercise prescription;Long Term: Able to explain home exercise prescription to exercise independently

## 2024-02-17 ENCOUNTER — Other Ambulatory Visit (HOSPITAL_COMMUNITY)
Admission: RE | Admit: 2024-02-17 | Discharge: 2024-02-17 | Disposition: A | Discharge status: Home / Self Care | Source: Ambulatory Visit | Attending: Certified Nurse Midwife | Admitting: Certified Nurse Midwife

## 2024-02-17 ENCOUNTER — Ambulatory Visit (INDEPENDENT_AMBULATORY_CARE_PROVIDER_SITE_OTHER): Admitting: Certified Nurse Midwife

## 2024-02-17 VITALS — BP 118/81 | HR 72 | Ht 68.0 in | Wt 308.8 lb

## 2024-02-17 DIAGNOSIS — N760 Acute vaginitis: Secondary | ICD-10-CM | POA: Insufficient documentation

## 2024-02-17 MED ORDER — PREMARIN 0.625 MG/GM VA CREA: TOPICAL_CREAM | VAGINAL | 12 refills | Status: AC

## 2024-02-17 NOTE — Progress Notes (Signed)
 Erin Melanie DASEN, NP   Chief Complaint  Patient presents with   Vaginitis    Vaginal irration with a lot of discharge    HPI:      Erin Contreras is a 63 y.o. G4P0010 whose LMP was No LMP recorded. Patient is postmenopausal., presents today for odor & irritation. She used boric acid after her last visit but had minimal improvement.    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) 12/03/2023   Elevated low density lipoprotein (LDL) cholesterol level 04/09/2022   B12 deficiency 09/16/2021   Adrenal adenoma 03/09/2021   Hiatal hernia 03/09/2021   Total knee replacement status 01/28/2021   Neurogenic pain 12/30/2020   Depression, major, single episode, moderate (HCC) 10/17/2020   Chronic radicular lumbar pain 05/06/2020   Lumbar spondylosis 05/06/2020   Lumbar degenerative disc disease 05/06/2020   SI joint arthritis (HCC) 05/06/2020   Chronic pain syndrome 05/06/2020   Prediabetes 03/21/2019   Vaginal itching 03/21/2019   Vitamin D  deficiency 12/30/2017   Benign hypertension 07/11/2017   Obstructive sleep apnea of adult 04/27/2017   Multiple nodules of lung 04/27/2017   Herpes simplex virus infection 03/17/2017   Chronic pain of left knee 12/02/2015   OA (osteoarthritis) of knee    Rheumatoid arthritis involving multiple sites with positive rheumatoid factor (HCC)    Obesity    Urge and stress incontinence    Gastroesophageal reflux disease with esophagitis 05/24/2014    Past Surgical History:  Procedure Laterality Date   CHOLECYSTECTOMY  2011   COLONOSCOPY N/A 03/19/2021   Procedure: COLONOSCOPY;  Surgeon: Therisa Bi, MD;  Location: [facility] ENDOSCOPY;  Service: Gastroenterology;  Laterality: N/A;   ESOPHAGOGASTRODUODENOSCOPY (EGD) WITH PROPOFOL  N/A 02/07/2023   Procedure: ESOPHAGOGASTRODUODENOSCOPY (EGD) WITH PROPOFOL ;  Surgeon: Therisa Bi, MD;  Location: [facility] ENDOSCOPY;  Service: Gastroenterology;   Laterality: N/A;   FRACTURE SURGERY Right    ankle as a child   JOINT REPLACEMENT Left 01/28/2021   KNEE ARTHROPLASTY Right 07/11/2017   Procedure: COMPUTER ASSISTED TOTAL KNEE ARTHROPLASTY;  Surgeon: Mardee Lynwood SQUIBB, MD;  Location: [facility] ORS;  Service: Orthopedics;  Laterality: Right;   KNEE ARTHROPLASTY Left 01/28/2021   Procedure: COMPUTER ASSISTED TOTAL KNEE ARTHROPLASTY;  Surgeon: Mardee Lynwood SQUIBB, MD;  Location: [facility] ORS;  Service: Orthopedics;  Laterality: Left;   SLEEVE GASTROPLASTY     TUMOR REMOVAL Right 2010   ear    Family History  Problem Relation Age of Onset   Cancer Mother        liver   Diabetes Mother    Hyperlipidemia Mother    Hypertension Mother    Cirrhosis Father        liver   Hypertension Daughter    Stroke Maternal Grandmother    Stroke Maternal Grandfather    Heart disease Paternal Grandmother    Diabetes Paternal Grandmother    Heart disease Paternal Grandfather    Diabetes Paternal Grandfather     Social History   Socioeconomic History   Marital status: Single    Spouse name: Not on file   Number of children: Not on file   Years of education: Not on file   Highest education level: Not on file  Occupational History   Not on file  Tobacco Use   Smoking status: Never   Smokeless tobacco: Never  Vaping Use   Vaping status: Never Used  Substance and Sexual Activity   Alcohol  use: No   Drug use: No   Sexual activity: Not Currently  Other Topics Concern   Not on file  Social History Narrative   Not on file   Social Drivers of Health   Financial Resource Strain: Medium Risk (12/06/2023)   Overall Financial Resource Strain (CARDIA)    Difficulty of Paying Living Expenses: Somewhat hard  Food Insecurity: Food Insecurity Present (12/06/2023)   Hunger Vital Sign    Worried About Running Out of Food in the Last Year: Often true    Ran Out of Food in the Last Year: Often true  Transportation Needs: No Transportation Needs (12/06/2023)   PRAPARE  - Administrator, Civil Service (Medical): No    Lack of Transportation (Non-Medical): No  Physical Activity: Insufficiently Active (12/06/2023)   Exercise Vital Sign    Days of Exercise per Week: 2 days    Minutes of Exercise per Session: 20 min  Stress: No Stress Concern Present (12/06/2023)   [employer] of Occupational Health - Occupational Stress Questionnaire    Feeling of Stress : Only a little  Social Connections: Moderately Isolated (12/06/2023)   Social Connection and Isolation Panel    Frequency of Communication with Friends and Family: More than three times a week    Frequency of Social Gatherings with Friends and Family: More than three times a week    Attends Religious Services: More than 4 times per year    Active Member of [employer] or Organizations: No    Attends Banker Meetings: Never    Marital Status: Never married  Intimate Partner Violence: Not At Risk (02/13/2024)   Humiliation, Afraid, Rape, and Kick questionnaire    Fear of Current or Ex-Partner: No    Emotionally Abused: No    Physically Abused: No    Sexually Abused: No    Outpatient Medications Prior to Visit  Medication Sig Dispense Refill   acyclovir  (ZOVIRAX ) 400 MG tablet Take 1 tablet (400 mg total) by mouth 2 (two) times daily as needed. 180 tablet 4   Cholecalciferol  1.25 MG (50000 UT) TABS Take 1 tablet by mouth once a week. 12 tablet 4   DULoxetine  (CYMBALTA ) 20 MG capsule Take 2 capsules (40 mg total) by mouth every morning. 60 capsule 2   famotidine  (PEPCID ) 40 MG tablet Take 1 tablet (40 mg total) by mouth at bedtime. 90 tablet 3   folic acid  (FOLVITE ) 1 MG tablet Take 1 mg by mouth daily.     hydroxychloroquine (PLAQUENIL) 200 MG tablet Take 200 mg by mouth 2 (two) times daily.     meloxicam  (MOBIC ) 15 MG tablet Take 15 mg by mouth daily.     methotrexate  (RHEUMATREX) 2.5 MG tablet Take 20 mg by mouth once a week.     pantoprazole  (PROTONIX ) 40 MG tablet Take 1 tablet  (40 mg total) by mouth 2 (two) times daily. 180 tablet 3   pregabalin  (LYRICA ) 150 MG capsule Take 1 capsule (150 mg total) by mouth at bedtime. 30 capsule 5   Tocilizumab (ACTEMRA ACTPEN) 162 MG/0.9ML SOAJ Inject into the skin.     vitamin B-12 (CYANOCOBALAMIN ) 100 MCG tablet Take 100 mcg by mouth daily.     No facility-administered medications prior to visit.      ROS:  Review of Systems  Constitutional: Negative.   Respiratory: Negative.    Cardiovascular: Negative.   Genitourinary:  Positive for vaginal pain.       Odor, irritation  OBJECTIVE:   Vitals:  BP 118/81   Pulse 72   Ht 5' 8 (1.727 m)   Wt (!) 308 lb 12.8 oz (140.1 kg)   BMI 46.95 kg/m   Physical Exam Vitals reviewed.  Constitutional:      General: She is not in acute distress.    Appearance: Normal appearance.  Cardiovascular:     Rate and Rhythm: Normal rate.  Pulmonary:     Effort: Pulmonary effort is normal.  Genitourinary:    Tanner stage (genital): 5.     Cervix: Normal.     Comments: Labia pale, consistent with vulvovaginal atrophy of menopause. Vagina pale, tissue intact. Skin:    General: Skin is warm and dry.  Neurological:     General: No focal deficit present.     Mental Status: She is alert and oriented to person, place, and time.  Psychiatric:        Mood and Affect: Mood normal.        Behavior: Behavior normal.     Results: No results found for this or any previous visit (from the past 24 hours).   Assessment/Plan: Recurrent vaginitis - Plan: Cervicovaginal ancillary only -Await swab results, start vaginal estrogen cream. If no improvement in 2 weeks contact provider.   Meds ordered this encounter  Medications   conjugated estrogens  (PREMARIN ) vaginal cream    Sig: Place 0.25 Applicatorfuls vaginally at bedtime for 14 days, THEN 0.25 Applicatorfuls every other day for 14 days, THEN 0.25 Applicatorfuls 2 (two) times a week.    Dispense:  42.5 g    Refill:  [address]      Harlene LITTIE Cisco, CNM 02/17/2024 2:00 PM

## 2024-02-21 ENCOUNTER — Other Ambulatory Visit: Payer: Self-pay | Admitting: Certified Nurse Midwife

## 2024-02-21 ENCOUNTER — Ambulatory Visit: Payer: Self-pay | Admitting: Certified Nurse Midwife

## 2024-02-21 LAB — CERVICOVAGINAL ANCILLARY ONLY
Bacterial Vaginitis (gardnerella): NEGATIVE
Candida Glabrata: NEGATIVE
Candida Vaginitis: POSITIVE — AB
Comment: NEGATIVE
Comment: NEGATIVE
Comment: NEGATIVE

## 2024-02-21 MED ORDER — FLUCONAZOLE 150 MG PO TABS: 150.0000 mg | ORAL_TABLET | ORAL | 0 refills | Status: AC

## 2024-02-21 NOTE — Progress Notes (Signed)
 Diflucan  150mg  po x2 doses every 72h to pharmacy of choice.

## 2024-02-22 ENCOUNTER — Institutional Professional Consult (permissible substitution): Payer: Self-pay | Admitting: Neurology

## 2024-02-27 ENCOUNTER — Encounter

## 2024-02-27 ENCOUNTER — Encounter: Admitting: *Deleted

## 2024-02-27 DIAGNOSIS — J849 Interstitial pulmonary disease, unspecified: Secondary | ICD-10-CM

## 2024-02-27 NOTE — Progress Notes (Signed)
 Daily Session Note  Patient Details  Name: Erin Contreras MRN: [id number] Date of Birth: [birth year] Referring Provider:   Flowsheet Row Pulmonary Rehab from 02/16/2024 in [facility]  Referring Provider Brena Reusing, MD    Encounter Date: 02/27/2024  Check In:  Session Check In - 02/27/24 1411       Check-In   Supervising physician immediately available to respond to emergencies See telemetry face sheet for immediately available ER MD    Location [facility]-Cardiac & Pul[facility]    Staff Present [name],[name];[name], MS, [name];[name], [name], [name];[name], BS, [name]    Virtual Visit No    Medication changes reported     No    Fall or balance concerns reported    No    Warm-up and Cool-down Performed on first and last piece of equipment    Resistance Training Performed Yes    VAD Patient? No    PAD/SET Patient? No      Pain Assessment   Currently in Pain? No/denies             Social History   Tobacco Use  Smoking Status Never  Smokeless Tobacco Never    Goals Met:  Independence with exercise equipment Exercise tolerated well No report of concerns or symptoms today Strength training completed today  Goals Unmet:  Not Applicable  Comments: First full day of exercise!  Patient was oriented to gym and equipment including functions, settings, policies, and procedures.  Patient's individual exercise prescription and treatment plan were reviewed.  All starting workloads were established based on the results of the 6 minute walk test done at initial orientation visit.  The plan for exercise progression was also introduced and progression will be customized based on patient's performance and goals.     Dr. Oneil Pinal is Medical Director for [facility].  Dr. Fuad Aleskerov is Medical Director for [facility] Pulmonary Rehabilitation.

## 2024-02-27 NOTE — Progress Notes (Signed)
 Assessment start time: 2:45 PM  Digestive issues/concerns: no known food allergies  24-hours Recall: B: 3-4 bacon strips, egg, biscuit OR yogurt and oatmeal L: m[employer] OR peanut butter cracker D: meat, veggies, starch Snack: cookies, chips,   Beverages water (32oz-48oz)  Education r/t nutrition plan Erin Contreras has had bariatric surgery ~9years ago. She has gained the weight back. She reports she struggles with consistency. Says some weeks she will make good food choices, but then the next week she makes poor choices. She reports she likes junk food and eats it often at night. Spoke with her about if it was a sweet craving or habitual. She felt it was habitual. Recommended she not buy any junk food and cut out sugary beverages. She drinks ~32-48oz of water. Suggested she aim for most of her fluids be water and if she wants sometime different it should be a not caloric beverage. She wants to find a weight loss plan that is sustainable and healthy. Spoke with her about foods and what a nutrition plan could look like. She is interested in making changes, RD will work on filling in knowledge gaps and checking in throughout rehab program to assess progress and consistency towards nutrition goals. Provided mediterranean diet handout, educated on types of fats, sources, and how to read labels. Brainstormed several protein carb paired meals and snacks with foods she likes and will eat.    Goal 1: Eat 3 times per day, small frequent meals or nutrient dense snacks  Goal 2: Drink 48-64oz of non sugary beverages (not during meals) Goal 3: Get junk food out of house and build habits around not eating junk on daily basis.  End time 3:22 PM

## 2024-02-29 ENCOUNTER — Encounter: Admitting: *Deleted

## 2024-02-29 DIAGNOSIS — R0602 Shortness of breath: Secondary | ICD-10-CM

## 2024-02-29 DIAGNOSIS — J849 Interstitial pulmonary disease, unspecified: Secondary | ICD-10-CM | POA: Diagnosis not present

## 2024-02-29 NOTE — Progress Notes (Signed)
 Pulmonary Individual Treatment Plan  Patient Details  Name: Erin Contreras MRN: [id number] Date of Birth: 04-20-1961 Referring Provider:   Flowsheet Row Pulmonary Rehab from 02/16/2024 in [facility] Cardiac and Pulmonary Rehab  Referring Provider Brena Reusing, MD    Initial Encounter Date:  Flowsheet Row Pulmonary Rehab from 02/16/2024 in [facility] Cardiac and Pulmonary Rehab  Date 02/16/24    Visit Diagnosis: Interstitial Contreras disease (HCC)  Shortness of breath  Patient's Home Medications on Admission:  Current Outpatient Medications:    acyclovir  (ZOVIRAX ) 400 MG tablet, Take 1 tablet (400 mg total) by mouth 2 (two) times daily as needed., Disp: 180 tablet, Rfl: 4   Cholecalciferol  1.25 MG (50000 UT) TABS, Take 1 tablet by mouth once a week., Disp: 12 tablet, Rfl: 4   conjugated estrogens  (PREMARIN ) vaginal cream, Place 0.25 Applicatorfuls vaginally at bedtime for 14 days, THEN 0.25 Applicatorfuls every other day for 14 days, THEN 0.25 Applicatorfuls 2 (two) times a week., Disp: 42.5 g, Rfl: 12   DULoxetine  (CYMBALTA ) 20 MG capsule, Take 2 capsules (40 mg total) by mouth every morning., Disp: 60 capsule, Rfl: 2   famotidine  (PEPCID ) 40 MG tablet, Take 1 tablet (40 mg total) by mouth at bedtime., Disp: 90 tablet, Rfl: 3   folic acid  (FOLVITE ) 1 MG tablet, Take 1 mg by mouth daily., Disp: , Rfl:    hydroxychloroquine (PLAQUENIL) 200 MG tablet, Take 200 mg by mouth 2 (two) times daily., Disp: , Rfl:    meloxicam  (MOBIC ) 15 MG tablet, Take 15 mg by mouth daily., Disp: , Rfl:    methotrexate  (RHEUMATREX) 2.5 MG tablet, Take 20 mg by mouth once a week., Disp: , Rfl:    pantoprazole  (PROTONIX ) 40 MG tablet, Take 1 tablet (40 mg total) by mouth 2 (two) times daily., Disp: 180 tablet, Rfl: 3   pregabalin  (LYRICA ) 150 MG capsule, Take 1 capsule (150 mg total) by mouth at bedtime., Disp: 30 capsule, Rfl: 5   Tocilizumab (ACTEMRA ACTPEN) 162 MG/0.9ML SOAJ, Inject into the skin., Disp: , Rfl:     vitamin B-12 (CYANOCOBALAMIN ) 100 MCG tablet, Take 100 mcg by mouth daily., Disp: , Rfl:   Past Medical History: Past Medical History:  Diagnosis Date   Boils    Ear mass    GERD (gastroesophageal reflux disease)    Hemorrhoids    Herpes simplex virus infection    HSV infection    Lumbago with sciatica    OA (osteoarthritis) of knee    Obesity    Overactive bladder    PONV (postoperative nausea and vomiting)    Pre-diabetes    RA (rheumatoid arthritis) (HCC)    Sleep apnea    does not use cpap-had bariatric surgery and has no issues since surgery   Urge and stress incontinence     Tobacco Use: Social History   Tobacco Use  Smoking Status Never  Smokeless Tobacco Never    Labs: Review Flowsheet  More data exists      Latest Ref Rng & Units 04/09/2022 10/08/2022 01/25/2023 07/28/2023 12/06/2023  Labs for ITP Cardiac and Pulmonary Rehab  Cholestrol 100 - 199 mg/dL 757  792  815  813  771   LDL (calc) 0 - 99 mg/dL 859  883  898  891  857   HDL-C >39 mg/dL 78  65  61  60  62   Trlycerides 0 - 149 mg/dL 859  848  874  899  863   Hemoglobin A1c 4.8 - 5.6 %  6.2  6.2  6.3  6.1  5.8      Pulmonary Assessment Scores:  Pulmonary Assessment Scores     Row Name 02/16/24 1025         ADL UCSD   ADL Phase Entry     SOB Score total 62     Rest 3     Walk 2     Stairs 2     Bath 2     Dress 2     Shop 3       CAT Score   CAT Score 11       mMRC Score   mMRC Score 4        UCSD: Self-administered rating of dyspnea associated with activities of daily living (ADLs) 6-point scale (0 = not at all to 5 = maximal or unable to do because of breathlessness)  Scoring Scores range from 0 to 120.  Minimally important difference is 5 units  CAT: CAT can identify the health impairment of COPD patients and is better correlated with disease progression.  CAT has a scoring range of zero to 40. The CAT score is classified into four groups of low (less than 10), medium (10 - 20),  high (21-30) and very high (31-40) based on the impact level of disease on health status. A CAT score over 10 suggests significant symptoms.  A worsening CAT score could be explained by an exacerbation, poor medication adherence, poor inhaler technique, or progression of COPD or comorbid conditions.  CAT MCID is 2 points  mMRC: mMRC (Modified Medical Research Council) Dyspnea Scale is used to assess the degree of baseline functional disability in patients of respiratory disease due to dyspnea. No minimal important difference is established. A decrease in score of 1 point or greater is considered a positive change.   Pulmonary Function Assessment:   Exercise Target Goals: Exercise Program Goal: Individual exercise prescription set using results from initial 6 min walk test and THRR while considering  patient's activity barriers and safety.   Exercise Prescription Goal: Initial exercise prescription builds to 30-45 minutes a day of aerobic activity, 2-3 days per week.  Home exercise guidelines will be given to patient during program as part of exercise prescription that the participant will acknowledge.  Education: Aerobic Exercise: - Group verbal and visual presentation on the components of exercise prescription. Introduces F.I.T.T principle from ACSM for exercise prescriptions.  Reviews F.I.T.T. principles of aerobic exercise including progression. Written material given at graduation.   Education: Resistance Exercise: - Group verbal and visual presentation on the components of exercise prescription. Introduces F.I.T.T principle from ACSM for exercise prescriptions  Reviews F.I.T.T. principles of resistance exercise including progression. Written material given at graduation.    Education: Exercise & Equipment Safety: - Individual verbal instruction and demonstration of equipment use and safety with use of the equipment. Flowsheet Row Pulmonary Rehab from 02/16/2024 in [facility] Cardiac and  Pulmonary Rehab  Date 02/16/24  Educator MB  Instruction Review Code 1- Verbalizes Understanding    Education: Exercise Physiology & General Exercise Guidelines: - Group verbal and written instruction with models to review the exercise physiology of the cardiovascular system and associated critical values. Provides general exercise guidelines with specific guidelines to those with heart or Contreras disease.  Flowsheet Row Pulmonary Rehab from 02/27/2024 in [facility] Cardiac and Pulmonary Rehab  Education need identified 02/27/24    Education: Flexibility, Balance, Mind/Body Relaxation: - Group verbal and visual presentation with interactive activity on the components  of exercise prescription. Introduces F.I.T.T principle from ACSM for exercise prescriptions. Reviews F.I.T.T. principles of flexibility and balance exercise training including progression. Also discusses the mind body connection.  Reviews various relaxation techniques to help reduce and manage stress (i.e. Deep breathing, progressive muscle relaxation, and visualization). Balance handout provided to take home. Written material given at graduation.   Activity Barriers & Risk Stratification:  Activity Barriers & Cardiac Risk Stratification - 02/16/24 1016       Activity Barriers & Cardiac Risk Stratification   Activity Barriers Arthritis;Joint Problems;Shortness of Breath;History of Falls;Left Knee Replacement;Right Knee Replacement          6 Minute Walk:  6 Minute Walk     Row Name 02/16/24 1015         6 Minute Walk   Phase Initial     Distance 1100 feet     Walk Time 6 minutes     # of Rest Breaks 0     MPH 2.08     METS 2.34     RPE 11     Perceived Dyspnea  0     VO2 Peak 8.19     Symptoms No     Resting HR 78 bpm     Resting BP 126/80     Resting Oxygen Saturation  93 %     Exercise Oxygen Saturation  during 6 min walk 90 %     Max Ex. HR 125 bpm     Max Ex. BP 160/80     2 Minute Post BP 118/80        Interval HR   1 Minute HR 90     2 Minute HR 106     3 Minute HR 125     4 Minute HR 108     5 Minute HR 101     6 Minute HR 107     2 Minute Post HR 88     Interval Heart Rate? Yes       Interval Oxygen   Interval Oxygen? Yes     Baseline Oxygen Saturation % 93 %     1 Minute Oxygen Saturation % 93 %     1 Minute Liters of Oxygen 0 L     2 Minute Oxygen Saturation % 91 %     2 Minute Liters of Oxygen 0 L     3 Minute Oxygen Saturation % 91 %     3 Minute Liters of Oxygen 0 L     4 Minute Oxygen Saturation % 91 %     4 Minute Liters of Oxygen 0 L     5 Minute Oxygen Saturation % 90 %     5 Minute Liters of Oxygen 0 L     6 Minute Oxygen Saturation % 90 %     6 Minute Liters of Oxygen 0 L     2 Minute Post Oxygen Saturation % 94 %     2 Minute Post Liters of Oxygen 0 L       Oxygen Initial Assessment:  Oxygen Initial Assessment - 02/13/24 1346       Home Oxygen   Home Oxygen Device None    Sleep Oxygen Prescription CPAP   not using,has an appt scheduled to be fitted for new CPAP   Home Exercise Oxygen Prescription None    Home Resting Oxygen Prescription None      Intervention   Short Term Goals To learn and exhibit compliance  with exercise, home and travel O2 prescription;To learn and understand importance of monitoring SPO2 with pulse oximeter and demonstrate accurate use of the pulse oximeter.;To learn and understand importance of maintaining oxygen saturations>88%;To learn and demonstrate proper pursed lip breathing techniques or other breathing techniques. ;To learn and demonstrate proper use of respiratory medications    Long  Term Goals Exhibits compliance with exercise, home  and travel O2 prescription;Verbalizes importance of monitoring SPO2 with pulse oximeter and return demonstration;Maintenance of O2 saturations>88%;Exhibits proper breathing techniques, such as pursed lip breathing or other method taught during program session;Compliance with respiratory medication           Oxygen Re-Evaluation:  Oxygen Re-Evaluation     Row Name 02/27/24 1414             Goals/Expected Outcomes   Comments Reviewed PLB technique with pt.  Talked about how it works and it's importance in maintaining their exercise saturations.       Goals/Expected Outcomes Short: Become more profiecient at using PLB.   Long: Become independent at using PLB.          Oxygen Discharge (Final Oxygen Re-Evaluation):  Oxygen Re-Evaluation - 02/27/24 1414       Goals/Expected Outcomes   Comments Reviewed PLB technique with pt.  Talked about how it works and it's importance in maintaining their exercise saturations.    Goals/Expected Outcomes Short: Become more profiecient at using PLB.   Long: Become independent at using PLB.          Initial Exercise Prescription:  Initial Exercise Prescription - 02/16/24 1000       Date of Initial Exercise RX and Referring Provider   Date 02/16/24    Referring Provider Brena Reusing, MD      Oxygen   Maintain Oxygen Saturation 88% or higher      Treadmill   MPH 1.8   Try   Grade 0    Minutes 15    METs 2.38      Recumbant Bike   Level 2    RPM 50    Watts 25    Minutes 15    METs 2.34      REL-XR   Level 1    Speed 50    Minutes 15    METs 2.34      T5 Nustep   Level 2    SPM 80    Minutes 15    METs 2.34      Track   Laps 25    Minutes 15    METs 2.36      Prescription Details   Frequency (times per week) 2    Duration Progress to 30 minutes of continuous aerobic without signs/symptoms of physical distress      Intensity   THRR 40-80% of Max Heartrate 109-140    Ratings of Perceived Exertion 11-13    Perceived Dyspnea 0-4      Progression   Progression Continue to progress workloads to maintain intensity without signs/symptoms of physical distress.      Resistance Training   Training Prescription Yes    Weight 4 lb    Reps 10-15          Perform Capillary Blood Glucose checks as  needed.  Exercise Prescription Changes:   Exercise Prescription Changes     Row Name 02/16/24 1000             Response to Exercise   Blood Pressure (Admit) 126/80  Blood Pressure (Exercise) 160/80       Blood Pressure (Exit) 118/80       Heart Rate (Admit) 78 bpm       Heart Rate (Exercise) 125 bpm       Heart Rate (Exit) 81 bpm       Oxygen Saturation (Admit) 93 %       Oxygen Saturation (Exercise) 90 %       Oxygen Saturation (Exit) 94 %       Rating of Perceived Exertion (Exercise) 11       Perceived Dyspnea (Exercise) 0       Symptoms none       Comments results       Intensity THRR New         Progression   Average METs 2.34          Exercise Comments:   Exercise Comments     Row Name 02/27/24 1412           Exercise Comments First full day of exercise!  Patient was oriented to gym and equipment including functions, settings, policies, and procedures.  Patient's individual exercise prescription and treatment plan were reviewed.  All starting workloads were established based on the results of the 6 minute walk test done at initial orientation visit.  The plan for exercise progression was also introduced and progression will be customized based on patient's performance and goals.          Exercise Goals and Review:   Exercise Goals     Row Name 02/16/24 1020             Exercise Goals   Increase Physical Activity Yes       Intervention Provide advice, education, support and counseling about physical activity/exercise needs.;Develop an individualized exercise prescription for aerobic and resistive training based on initial evaluation findings, risk stratification, comorbidities and participant's personal goals.       Expected Outcomes Short Term: Attend rehab on a regular basis to increase amount of physical activity.;Long Term: Add in home exercise to make exercise part of routine and to increase amount of physical activity.;Long Term:  Exercising regularly at least 3-5 days a week.       Increase Strength and Stamina Yes       Intervention Provide advice, education, support and counseling about physical activity/exercise needs.;Develop an individualized exercise prescription for aerobic and resistive training based on initial evaluation findings, risk stratification, comorbidities and participant's personal goals.       Expected Outcomes Short Term: Increase workloads from initial exercise prescription for resistance, speed, and METs.;Short Term: Perform resistance training exercises routinely during rehab and add in resistance training at home;Long Term: Improve cardiorespiratory fitness, muscular endurance and strength as measured by increased METs and functional capacity ( )       Able to understand and use rate of perceived exertion (RPE) scale Yes       Intervention Provide education and explanation on how to use RPE scale       Expected Outcomes Short Term: Able to use RPE daily in rehab to express subjective intensity level;Long Term:  Able to use RPE to guide intensity level when exercising independently       Able to understand and use Dyspnea scale Yes       Intervention Provide education and explanation on how to use Dyspnea scale       Expected Outcomes Short Term: Able to use Dyspnea scale daily in  rehab to express subjective sense of shortness of breath during exertion;Long Term: Able to use Dyspnea scale to guide intensity level when exercising independently       Knowledge and understanding of Target Heart Rate Range (THRR) Yes       Intervention Provide education and explanation of THRR including how the numbers were predicted and where they are located for reference       Expected Outcomes Short Term: Able to state/look up THRR;Short Term: Able to use daily as guideline for intensity in rehab;Long Term: Able to use THRR to govern intensity when exercising independently       Able to check pulse independently Yes        Intervention Provide education and demonstration on how to check pulse in carotid and radial arteries.;Review the importance of being able to check your own pulse for safety during independent exercise       Expected Outcomes Short Term: Able to explain why pulse checking is important during independent exercise;Long Term: Able to check pulse independently and accurately       Understanding of Exercise Prescription Yes       Intervention Provide education, explanation, and written materials on patient's individual exercise prescription       Expected Outcomes Short Term: Able to explain program exercise prescription;Long Term: Able to explain home exercise prescription to exercise independently          Exercise Goals Re-Evaluation :  Exercise Goals Re-Evaluation     Row Name 02/27/24 1413             Exercise Goal Re-Evaluation   Exercise Goals Review Increase Physical Activity;Knowledge and understanding of Target Heart Rate Range (THRR);Able to understand and use rate of perceived exertion (RPE) scale;Understanding of Exercise Prescription;Increase Strength and Stamina;Able to check pulse independently       Comments Reviewed RPE and dyspnea scale, THR and program prescription with pt today.  Pt voiced understanding and was given a copy of goals to take home.       Expected Outcomes Short: Use RPE daily to regulate intensity.  Long: Follow program prescription in THR.          Discharge Exercise Prescription (Final Exercise Prescription Changes):  Exercise Prescription Changes - 02/16/24 1000       Response to Exercise   Blood Pressure (Admit) 126/80    Blood Pressure (Exercise) 160/80    Blood Pressure (Exit) 118/80    Heart Rate (Admit) 78 bpm    Heart Rate (Exercise) 125 bpm    Heart Rate (Exit) 81 bpm    Oxygen Saturation (Admit) 93 %    Oxygen Saturation (Exercise) 90 %    Oxygen Saturation (Exit) 94 %    Rating of Perceived Exertion (Exercise) 11    Perceived  Dyspnea (Exercise) 0    Symptoms none    Comments results    Intensity THRR New      Progression   Average METs 2.34          Nutrition:  Target Goals: Understanding of nutrition guidelines, daily intake of sodium 1500mg , cholesterol 200mg , calories 30% from fat and 7% or less from saturated fats, daily to have 5 or more servings of fruits and vegetables.  Education: All About Nutrition: -Group instruction provided by verbal, written material, interactive activities, discussions, models, and posters to present general guidelines for heart healthy nutrition including fat, fiber, MyPlate, the role of sodium in heart healthy nutrition, utilization of the nutrition  label, and utilization of this knowledge for meal planning. Follow up email sent as well. Written material given at graduation.   Biometrics:  Pre Biometrics - 02/16/24 1024       Pre Biometrics   Height 5' 9.5 (1.765 m)    Weight 308 lb 12.8 oz (140.1 kg)    Waist Circumference 53.9 inches    Hip Circumference 62 inches    Waist to Hip Ratio 0.87 %    BMI (Calculated) 44.96    Single Leg Stand 12.5 seconds           Nutrition Therapy Plan and Nutrition Goals:  Nutrition Therapy & Goals - 02/27/24 1534       Nutrition Therapy   Diet Cardiac, Low Na    Protein (specify units) 80g    Fiber 25 grams    Whole Grain Foods 3 servings    Saturated Fats 15 max. grams    Fruits and Vegetables 5 servings/day    Sodium 2 grams      Personal Nutrition Goals   Nutrition Goal Eat 3 times per day, small frequent meals or nutrient dense snacks    Personal Goal #2 Drink 48-64oz of non sugary beverages (not during meals)    Personal Goal #3 Get junk food out of house and build habits around not eating junk on daily basis.    Comments Erin Contreras has had bariatric surgery ~9years ago. She has gained the weight back. She reports she struggles with consistency. Says some weeks she will make good food choices, but then the  next week she makes poor choices. She reports she likes junk food and eats it often at night. Spoke with her about if it was a sweet craving or habitual. She felt it was habitual. Recommended she not buy any junk food and cut out sugary beverages. She drinks ~32-48oz of water. Suggested she aim for most of her fluids be water and if she wants sometime different it should be a not caloric beverage. She wants to find a weight loss plan that is sustainable and healthy. Spoke with her about foods and what a nutrition plan could look like. She is interested in making changes, RD will work on filling in knowledge gaps and checking in throughout rehab program to assess progress and consistency towards nutrition goals. Provided mediterranean diet handout, educated on types of fats, sources, and how to read labels. Brainstormed several protein carb paired meals and snacks with foods she likes and will eat.      Intervention Plan   Intervention Prescribe, educate and counsel regarding individualized specific dietary modifications aiming towards targeted core components such as weight, hypertension, lipid management, diabetes, heart failure and other comorbidities.;Nutrition handout(s) given to patient.    Expected Outcomes Long Term Goal: Adherence to prescribed nutrition plan.;Short Term Goal: A plan has been developed with personal nutrition goals set during dietitian appointment.;Short Term Goal: Understand basic principles of dietary content, such as calories, fat, sodium, cholesterol and nutrients.          Nutrition Assessments:  MEDIFICTS Score Key: >=70 Need to make dietary changes  40-70 Heart Healthy Diet <= 40 Therapeutic Level Cholesterol Diet  Flowsheet Row Pulmonary Rehab from 02/27/2024 in [facility] Cardiac and Pulmonary Rehab  Picture Your Plate Total Score on Admission 39   Picture Your Plate Scores: <59 Unhealthy dietary pattern with much room for improvement. 41-50 Dietary pattern unlikely  to meet recommendations for good health and room for improvement. 51-60 More healthful dietary  pattern, with some room for improvement.  >60 Healthy dietary pattern, although there may be some specific behaviors that could be improved.   Nutrition Goals Re-Evaluation:   Nutrition Goals Discharge (Final Nutrition Goals Re-Evaluation):   Psychosocial: Target Goals: Acknowledge presence or absence of significant depression and/or stress, maximize coping skills, provide positive support system. Participant is able to verbalize types and ability to use techniques and skills needed for reducing stress and depression.   Education: Stress, Anxiety, and Depression - Group verbal and visual presentation to define topics covered.  Reviews how body is impacted by stress, anxiety, and depression.  Also discusses healthy ways to reduce stress and to treat/manage anxiety and depression.  Written material given at graduation.   Education: Sleep Hygiene -Provides group verbal and written instruction about how sleep can affect your health.  Define sleep hygiene, discuss sleep cycles and impact of sleep habits. Review good sleep hygiene tips.    Initial Review & Psychosocial Screening:  Initial Psych Review & Screening - 02/13/24 1350       Initial Review   Current issues with History of Depression;Current Stress Concerns    Source of Stress Concerns Chronic Illness    Comments --      Family Dynamics   Good Support System? Yes      Barriers   Psychosocial barriers to participate in program There are no identifiable barriers or psychosocial needs.;The patient should benefit from training in stress management and relaxation.      Screening Interventions   Interventions Encouraged to exercise    Expected Outcomes Short Term goal: Identification and review with participant of any Quality of Life or Depression concerns found by scoring the questionnaire.;Long Term goal: The participant improves  quality of Life and PHQ9 Scores as seen by post scores and/or verbalization of changes          Quality of Life Scores:  Scores of 19 and below usually indicate a poorer quality of life in these areas.  A difference of  2-3 points is a clinically meaningful difference.  A difference of 2-3 points in the total score of the Quality of Life Index has been associated with significant improvement in overall quality of life, self-image, physical symptoms, and general health in studies assessing change in quality of life.  PHQ-9: Review Flowsheet  More data exists      02/16/2024 01/30/2024 12/06/2023 11/01/2023 07/28/2023  Depression screen PHQ 2/9  Decreased Interest 1 0 0 1 0  Down, Depressed, Hopeless 0 0 0 0 1  PHQ - 2 Score 1 0 0 1 1  Altered sleeping 2 2 3  - 1  Tired, decreased energy 3 3 2  - 1  Change in appetite 0 0 0 - 0  Feeling bad or failure about yourself  0 0 0 - 0  Trouble concentrating 0 0 0 - 0  Moving slowly or fidgety/restless 0 1 0 - 0  Suicidal thoughts 0 0 0 - 0  PHQ-9 Score 6 6 5  - 3  Difficult doing work/chores Not difficult at all Not difficult at all Not difficult at all - Somewhat difficult   Interpretation of Total Score  Total Score Depression Severity:  1-4 = Minimal depression, 5-9 = Mild depression, 10-14 = Moderate depression, 15-19 = Moderately severe depression, 20-27 = Severe depression   Psychosocial Evaluation and Intervention:  Psychosocial Evaluation - 02/13/24 1400       Psychosocial Evaluation & Interventions   Interventions Encouraged to exercise with the  program and follow exercise prescription    Comments Patient stated that she has had occasional periods of depression, however does not take medication specifically for that. Although, patient is prescribed an anti-depressant that is also used to help manage chronic pain. Patient stated she does have some stress occasionally related to regular everyday life occurrences, but nothing specific and  not all of the time. Patient stated she has a good support system of family and friends. Patient is interested in improving her shortness of breath and learning ways to improve her health and weight through lifestyle changes such as diet and exercise.    Expected Outcomes Short Term: Attend pulmonary rehab for exercise and education. Long Term: Develop and maintain positive healthcare habits.    Continue Psychosocial Services  Follow up required by staff          Psychosocial Re-Evaluation:   Psychosocial Discharge (Final Psychosocial Re-Evaluation):   Education: Education Goals: Education classes will be provided on a weekly basis, covering required topics. Participant will state understanding/return demonstration of topics presented.  Learning Barriers/Preferences:  Learning Barriers/Preferences - 02/13/24 1349       Learning Barriers/Preferences   Learning Barriers None    Learning Preferences Video;Verbal Instruction;Individual Instruction;Group Instruction;Computer/Internet;Audio;Written Material          General Pulmonary Education Topics:  Infection Prevention: - Provides verbal and written material to individual with discussion of infection control including proper hand washing and proper equipment cleaning during exercise session. Flowsheet Row Pulmonary Rehab from 02/16/2024 in [facility] Cardiac and Pulmonary Rehab  Date 02/16/24  Educator MB  Instruction Review Code 1- Verbalizes Understanding    Falls Prevention: - Provides verbal and written material to individual with discussion of falls prevention and safety. Flowsheet Row Pulmonary Rehab from 02/16/2024 in [facility] Cardiac and Pulmonary Rehab  Date 02/16/24  Educator MB  Instruction Review Code 1- Verbalizes Understanding    Chronic Contreras Disease Review: - Group verbal instruction with posters, models, PowerPoint presentations and videos,  to review new updates, new respiratory medications, new advancements in  procedures and treatments. Providing information on websites and 800 numbers for continued self-education. Includes information about supplement oxygen, available portable oxygen systems, continuous and intermittent flow rates, oxygen safety, concentrators, and Medicare reimbursement for oxygen. Explanation of Pulmonary Drugs, including class, frequency, complications, importance of spacers, rinsing mouth after steroid MDI's, and proper cleaning methods for nebulizers. Review of basic Contreras anatomy and physiology related to function, structure, and complications of Contreras disease. Review of risk factors. Discussion about methods for diagnosing sleep apnea and types of masks and machines for OSA. Includes a review of the use of types of environmental controls: home humidity, furnaces, filters, dust mite/pet prevention, HEPA vacuums. Discussion about weather changes, air quality and the benefits of nasal washing. Instruction on Warning signs, infection symptoms, calling MD promptly, preventive modes, and value of vaccinations. Review of effective airway clearance, coughing and/or vibration techniques. Emphasizing that all should Create an Action Plan. Written material given at graduation. Flowsheet Row Pulmonary Rehab from 02/27/2024 in [facility] Cardiac and Pulmonary Rehab  Education need identified 02/27/24    AED/CPR: - Group verbal and written instruction with the use of models to demonstrate the basic use of the AED with the basic ABC's of resuscitation.    Anatomy and Cardiac Procedures: - Group verbal and visual presentation and models provide information about basic cardiac anatomy and function. Reviews the testing methods done to diagnose heart disease and the outcomes of the test results. Describes  the treatment choices: Medical Management, Angioplasty, or Coronary Bypass Surgery for treating various heart conditions including Myocardial Infarction, Angina, Valve Disease, and Cardiac Arrhythmias.   Written material given at graduation.   Medication Safety: - Group verbal and visual instruction to review commonly prescribed medications for heart and Contreras disease. Reviews the medication, class of the drug, and side effects. Includes the steps to properly store meds and maintain the prescription regimen.  Written material given at graduation.   Other: -Provides group and verbal instruction on various topics (see comments)   Knowledge Questionnaire Score:  Knowledge Questionnaire Score - 02/27/24 1701       Knowledge Questionnaire Score   Pre Score 14/18           Core Components/Risk Factors/Patient Goals at Admission:  Personal Goals and Risk Factors at Admission - 02/16/24 1026       Core Components/Risk Factors/Patient Goals on Admission    Weight Management Yes;Obesity;Weight Loss    Intervention Weight Management: Provide education and appropriate resources to help participant work on and attain dietary goals.;Weight Management/Obesity: Establish reasonable short term and long term weight goals.;Obesity: Provide education and appropriate resources to help participant work on and attain dietary goals.;Weight Management: Develop a combined nutrition and exercise program designed to reach desired caloric intake, while maintaining appropriate intake of nutrient and fiber, sodium and fats, and appropriate energy expenditure required for the weight goal.    Admit Weight 308 lb 12.8 oz (140.1 kg)    Goal Weight: Short Term 231 lb 12.8 oz (105.1 kg)    Goal Weight: Long Term 154 lb 6.4 oz (70 kg)    Expected Outcomes Short Term: Continue to assess and modify interventions until short term weight is achieved;Long Term: Adherence to nutrition and physical activity/exercise program aimed toward attainment of established weight goal;Weight Loss: Understanding of general recommendations for a balanced deficit meal plan, which promotes 1-2 lb weight loss per week and includes a negative  energy balance of [phone] kcal/d;Understanding recommendations for meals to include 15-35% energy as protein, 25-35% energy from fat, 35-60% energy from carbohydrates, less than 200mg  of dietary cholesterol, 20-35 gm of total fiber daily;Understanding of distribution of calorie intake throughout the day with the consumption of 4-5 meals/snacks    Improve shortness of breath with ADL's Yes    Intervention Provide education, individualized exercise plan and daily activity instruction to help decrease symptoms of SOB with activities of daily living.    Expected Outcomes Short Term: Improve cardiorespiratory fitness to achieve a reduction of symptoms when performing ADLs;Long Term: Be able to perform more ADLs without symptoms or delay the onset of symptoms    Hypertension Yes    Intervention Provide education on lifestyle modifcations including regular physical activity/exercise, weight management, moderate sodium restriction and increased consumption of fresh fruit, vegetables, and low fat dairy, alcohol moderation, and smoking cessation.    Expected Outcomes Short Term: Continued assessment and intervention until BP is < 140/67mm HG in hypertensive participants. < 130/48mm HG in hypertensive participants with diabetes, heart failure or chronic kidney disease.;Long Term: Maintenance of blood pressure at goal levels.    Lipids Yes    Intervention Provide education and support for participant on nutrition & aerobic/resistive exercise along with prescribed medications to achieve LDL 70mg , HDL >40mg .    Expected Outcomes Short Term: Participant states understanding of desired cholesterol values and is compliant with medications prescribed. Participant is following exercise prescription and nutrition guidelines.;Long Term: Cholesterol controlled with medications as prescribed, with  individualized exercise RX and with personalized nutrition plan. Value goals: LDL < 70mg , HDL > 40 mg.           Education:Diabetes - Individual verbal and written instruction to review signs/symptoms of diabetes, desired ranges of glucose level fasting, after meals and with exercise. Acknowledge that pre and post exercise glucose checks will be done for 3 sessions at entry of program.   Know Your Numbers and Heart Failure: - Group verbal and visual instruction to discuss disease risk factors for cardiac and pulmonary disease and treatment options.  Reviews associated critical values for Overweight/Obesity, Hypertension, Cholesterol, and Diabetes.  Discusses basics of heart failure: signs/symptoms and treatments.  Introduces Heart Failure Zone chart for action plan for heart failure.  Written material given at graduation.   Core Components/Risk Factors/Patient Goals Review:    Core Components/Risk Factors/Patient Goals at Discharge (Final Review):    ITP Comments:  ITP Comments     Row Name 02/13/24 1402 02/16/24 1014 02/27/24 1412 02/29/24 0831     ITP Comments Initial phone call completed. Diagnosis can be found in [facility] 01/20/2024. EP Orientation scheduled for February 16, 2024 @ 0800. Completed and gym orientation for respiratory care services. Initial ITP created and sent for review to Dr. Faud Aleskerov, Medical Director. First full day of exercise!  Patient was oriented to gym and equipment including functions, settings, policies, and procedures.  Patient's individual exercise prescription and treatment plan were reviewed.  All starting workloads were established based on the results of the 6 minute walk test done at initial orientation visit.  The plan for exercise progression was also introduced and progression will be customized based on patient's performance and goals. 30 Day review completed. Medical Director ITP review done, changes made as directed, and signed approval by Medical Director. new to program.       Comments: 30 day review

## 2024-02-29 NOTE — Progress Notes (Signed)
 Daily Session Note  Patient Details  Name: Erin Contreras MRN: [id number] Date of Birth: 15-Feb-1961 Referring Provider:   Flowsheet Row Pulmonary Rehab from 02/16/2024 in [facility] Cardiac and Pulmonary Rehab  Referring Provider Brena Reusing, MD    Encounter Date: 02/29/2024  Check In:  Session Check In - 02/29/24 1438       Check-In   Supervising physician immediately available to respond to emergencies See telemetry face sheet for immediately available ER MD    Location [facility]-Cardiac & Pulmonary Rehab    Staff Present [name],[name];Kelly Dyane BS, [name], [name];[name], BS, [name];Kelly Bollinger [facility]    Virtual Visit No    Medication changes reported     No    Fall or balance concerns reported    No    Warm-up and Cool-down Performed on first and last piece of equipment    Resistance Training Performed Yes    VAD Patient? No    PAD/SET Patient? No      Pain Assessment   Currently in Pain? No/denies             Social History   Tobacco Use  Smoking Status Never  Smokeless Tobacco Never    Goals Met:  Independence with exercise equipment Exercise tolerated well No report of concerns or symptoms today Strength training completed today  Goals Unmet:  Not Applicable  Comments: Pt able to follow exercise prescription today without complaint.  Will continue to monitor for progression.    Dr. Oneil Pinal is Medical Director for [facility] Cardiac Rehabilitation.  Dr. Fuad Aleskerov is Medical Director for [facility] Pulmonary Rehabilitation.

## 2024-03-01 ENCOUNTER — Other Ambulatory Visit: Payer: Self-pay | Admitting: Nurse Practitioner

## 2024-03-05 ENCOUNTER — Encounter: Attending: Pulmonary Disease | Admitting: *Deleted

## 2024-03-05 DIAGNOSIS — R0602 Shortness of breath: Secondary | ICD-10-CM | POA: Insufficient documentation

## 2024-03-05 DIAGNOSIS — J849 Interstitial pulmonary disease, unspecified: Secondary | ICD-10-CM | POA: Insufficient documentation

## 2024-03-05 NOTE — Progress Notes (Signed)
 Daily Session Note  Patient Details  Name: Erin Contreras MRN: [id number] Date of Birth: [birth year] Referring Provider:   Flowsheet Row Pulmonary Rehab from 02/16/2024 in [facility] Cardiac and Pulmonary Rehab  Referring Provider Brena Reusing, MD    Encounter Date: 03/05/2024  Check In:  Session Check In - 03/05/24 1417       Check-In   Supervising physician immediately available to respond to emergencies See telemetry face sheet for immediately available ER MD    Location [facility]-Cardiac & Pulmonary Rehab    Staff Present Rollene Paterson, MS, [name];Laureen Delores, BS, RRT, CPFT;Shayma Pfefferle Tressa RN,[name];[name], BS, [name]    Virtual Visit No    Medication changes reported     No    Fall or balance concerns reported    No    Warm-up and Cool-down Performed on first and last piece of equipment    Resistance Training Performed Yes    VAD Patient? No    PAD/SET Patient? No      Pain Assessment   Currently in Pain? No/denies             Social History   Tobacco Use  Smoking Status Never  Smokeless Tobacco Never    Goals Met:  Independence with exercise equipment Exercise tolerated well No report of concerns or symptoms today Strength training completed today  Goals Unmet:  Not Applicable  Comments: Pt able to follow exercise prescription today without complaint.  Will continue to monitor for progression.    Dr. Oneil Pinal is Medical Director for [facility] Cardiac Rehabilitation.  Dr. Fuad Aleskerov is Medical Director for [facility] Pulmonary Rehabilitation.

## 2024-03-07 ENCOUNTER — Encounter: Admitting: *Deleted

## 2024-03-07 DIAGNOSIS — R0602 Shortness of breath: Secondary | ICD-10-CM

## 2024-03-07 DIAGNOSIS — J849 Interstitial pulmonary disease, unspecified: Secondary | ICD-10-CM | POA: Diagnosis not present

## 2024-03-07 NOTE — Progress Notes (Signed)
 Daily Session Note  Patient Details  Name: KAMALANI MASTRO MRN: [id number] Date of Birth: [birth year] Referring Provider:   Flowsheet Row Pulmonary Rehab from 02/16/2024 in [facility] Cardiac and Pulmonary Rehab  Referring Provider Brena Reusing, MD    Encounter Date: 03/07/2024  Check In:  Session Check In - 03/07/24 1417       Check-In   Supervising physician immediately available to respond to emergencies See telemetry face sheet for immediately available ER MD    Location [facility]-Cardiac & Pulmonary Rehab    Staff Present Burnard Hint BS, [name], [name];Tanaka Gillen Tressa RN,[name];[name], BS, [name];Kelly Bollinger [facility]    Virtual Visit No    Medication changes reported     No    Fall or balance concerns reported    No    Warm-up and Cool-down Performed on first and last piece of equipment    Resistance Training Performed Yes    VAD Patient? No    PAD/SET Patient? No      Pain Assessment   Currently in Pain? No/denies             Social History   Tobacco Use  Smoking Status Never  Smokeless Tobacco Never    Goals Met:  Independence with exercise equipment Exercise tolerated well No report of concerns or symptoms today Strength training completed today  Goals Unmet:  Not Applicable  Comments: Pt able to follow exercise prescription today without complaint.  Will continue to monitor for progression.    Dr. Oneil Pinal is Medical Director for [facility] Cardiac Rehabilitation.  Dr. Fuad Aleskerov is Medical Director for [facility] Pulmonary Rehabilitation.

## 2024-03-08 ENCOUNTER — Institutional Professional Consult (permissible substitution): Admitting: Neurology

## 2024-03-08 ENCOUNTER — Ambulatory Visit: Admitting: Neurology

## 2024-03-08 ENCOUNTER — Encounter: Payer: Self-pay | Admitting: Neurology

## 2024-03-08 VITALS — BP 140/91 | HR 80 | Ht 69.0 in | Wt 309.6 lb

## 2024-03-08 DIAGNOSIS — Z9884 Bariatric surgery status: Secondary | ICD-10-CM | POA: Diagnosis not present

## 2024-03-08 DIAGNOSIS — G4733 Obstructive sleep apnea (adult) (pediatric): Secondary | ICD-10-CM | POA: Diagnosis not present

## 2024-03-08 DIAGNOSIS — G4719 Other hypersomnia: Secondary | ICD-10-CM

## 2024-03-08 DIAGNOSIS — Z6841 Body Mass Index (BMI) 40.0 and over, adult: Secondary | ICD-10-CM

## 2024-03-08 DIAGNOSIS — R351 Nocturia: Secondary | ICD-10-CM | POA: Diagnosis not present

## 2024-03-08 DIAGNOSIS — Z820 Family history of epilepsy and other diseases of the nervous system: Secondary | ICD-10-CM

## 2024-03-08 DIAGNOSIS — R519 Headache, unspecified: Secondary | ICD-10-CM

## 2024-03-08 NOTE — Progress Notes (Signed)
 Subjective:    Patient ID: Erin Contreras is a 63 y.o. female.  HPI    True Mar, MD, PhD [facility] [address], [address] [address] [place], [place] [address]  Dear Melanie,  I saw your patient, Erin Contreras, upon your kind request in my sleep clinic today for initial consultation of her sleep disorder, in particular, evaluation of her prior diagnosis of obstructive sleep apnea.  The patient is accompanied by her daughter today.  As you know, Erin Contreras is a 63 year old female with an underlying medical history of rheumatoid arthritis, low back pain, osteoarthritis, with status post bilateral knee replacement surgeries, hypertension, prediabetes, reflux disease, overactive bladder, and severe obesity with a BMI of over 45, s/p sleep gastrectomy in 2016, who was previously diagnosed with obstructive sleep apnea recommended to start PAP therapy.  Her Epworth sleepiness score is 10 out of 24, fatigue severity score is 16 out of 63.  I reviewed your office note from 12/06/2023.  Prior sleep study results are not available for my review today.  Sleep testing was over 9 years ago. She has not been on PAP therapy in years, reports having a CPAP machine in the past but stopped using it.  As she recalls, she had mild sleep apnea at the time.  She is working on weight loss.  Her mother had sleep apnea.  She gained most of her weight back after her bariatric surgery.  She has a bedtime of around 9 and rise time between 6 and 7 AM but she does not sleep well at night.  She naps during the day.  She drinks caffeine in the form of soda, usually 1 can per day.  She lives alone, she is single, no pets in the household.  She does watch TV in her bedroom and sometimes falls asleep with the TV on.  She is a non-smoker.  She does not drink any alcohol.  She would be willing to get retested and consider PAP therapy again.  She has occasionally woken up with a headache which is typically  self-limited.  She has nocturia about once per average night.  She has woken up with a sore jaw and sore throat.  Her Past Medical History Is Significant For: Past Medical History:  Diagnosis Date   Boils    Ear mass    GERD (gastroesophageal reflux disease)    Hemorrhoids    Herpes simplex virus infection    HSV infection    Lumbago with sciatica    OA (osteoarthritis) of knee    Obesity    Overactive bladder    PONV (postoperative nausea and vomiting)    Pre-diabetes    RA (rheumatoid arthritis) (HCC)    Sleep apnea    does not use cpap-had bariatric surgery and has no issues since surgery   Urge and stress incontinence     Her Past Surgical History Is Significant For: Past Surgical History:  Procedure Laterality Date   CHOLECYSTECTOMY  2011   COLONOSCOPY N/A 03/19/2021   Procedure: COLONOSCOPY;  Surgeon: Therisa Bi, MD;  Location[facility] ENDOSCOPY;  Service: Gastroenterology;  Laterality: N/A;   ESOPHAGOGASTRODUODENOSCOPY (EGD) WITH PROPOFOL  N/A 02/07/2023   Procedure: ESOPHAGOGASTRODUODENOSCOPY (EGD) WITH PROPOFOL ;  Surgeon: Therisa Bi, MD;  Location: [facility] ENDOSCOPY;  Service: Gastroenterology;  Laterality: N/A;   FRACTURE SURGERY Right    ankle as a child   JOINT REPLACEMENT Left 01/28/2021   KNEE ARTHROPLASTY Right 07/11/2017   Procedure: COMPUTER ASSISTED TOTAL  KNEE ARTHROPLASTY;  Surgeon: Mardee Lynwood SQUIBB, MD;  Location: [facility] ORS;  Service: Orthopedics;  Laterality: Right;   KNEE ARTHROPLASTY Left 01/28/2021   Procedure: COMPUTER ASSISTED TOTAL KNEE ARTHROPLASTY;  Surgeon: Mardee Lynwood SQUIBB, MD;  Location: [facility] ORS;  Service: Orthopedics;  Laterality: Left;   SLEEVE GASTROPLASTY     TUMOR REMOVAL Right 2010   ear    Her Family History Is Significant For: Family History  Problem Relation Age of Onset   Cancer Mother        liver   Diabetes Mother    Hyperlipidemia Mother    Hypertension Mother    Cirrhosis Father        liver   Hypertension Daughter    Stroke  Maternal Grandmother    Stroke Maternal Grandfather    Heart disease Paternal Grandmother    Diabetes Paternal Grandmother    Heart disease Paternal Grandfather    Diabetes Paternal Grandfather     Her Social History Is Significant For: Social History   Socioeconomic History   Marital status: Single    Spouse name: Not on file   Number of children: Not on file   Years of education: Not on file   Highest education level: Not on file  Occupational History   Not on file  Tobacco Use   Smoking status: Never   Smokeless tobacco: Never  Vaping Use   Vaping status: Never Used  Substance and Sexual Activity   Alcohol use: No   Drug use: No   Sexual activity: Not Currently  Other Topics Concern   Not on file  Social History Narrative   Caffiene soda occasional   Working retired   Armed forces operational officer alone.    Social Drivers of Health   Financial Resource Strain: Medium Risk (12/06/2023)   Overall Financial Resource Strain (CARDIA)    Difficulty of Paying Living Expenses: Somewhat hard  Food Insecurity: Food Insecurity Present (12/06/2023)   Hunger Vital Sign    Worried About Running Out of Food in the Last Year: Often true    Ran Out of Food in the Last Year: Often true  Transportation Needs: No Transportation Needs (12/06/2023)   PRAPARE - Administrator, Civil Service (Medical): No    Lack of Transportation (Non-Medical): No  Physical Activity: Insufficiently Active (12/06/2023)   Exercise Vital Sign    Days of Exercise per Week: 2 days    Minutes of Exercise per Session: 20 min  Stress: No Stress Concern Present (12/06/2023)   [employer] of Occupational Health - Occupational Stress Questionnaire    Feeling of Stress : Only a little  Social Connections: Moderately Isolated (12/06/2023)   Social Connection and Isolation Panel    Frequency of Communication with Friends and Family: More than three times a week    Frequency of Social Gatherings with Friends and Family: More than  three times a week    Attends Religious Services: More than 4 times per year    Active Member of [employer] or Organizations: No    Attends Banker Meetings: Never    Marital Status: Never married    Her Allergies Are:  No Known Allergies:   Her Current Medications Are:  Outpatient Encounter Medications as of 03/08/2024  Medication Sig   acyclovir  (ZOVIRAX ) 400 MG tablet Take 1 tablet (400 mg total) by mouth 2 (two) times daily as needed.   Cholecalciferol  1.25 MG (50000 UT) TABS Take 1 tablet by mouth once a week.  conjugated estrogens  (PREMARIN ) vaginal cream Place 0.25 Applicatorfuls vaginally at bedtime for 14 days, THEN 0.25 Applicatorfuls every other day for 14 days, THEN 0.25 Applicatorfuls 2 (two) times a week.   DULoxetine  (CYMBALTA ) 20 MG capsule Take 2 capsules (40 mg total) by mouth every morning.   famotidine  (PEPCID ) 40 MG tablet Take 1 tablet (40 mg total) by mouth at bedtime.   folic acid  (FOLVITE ) 1 MG tablet Take 1 mg by mouth daily.   hydroxychloroquine (PLAQUENIL) 200 MG tablet Take 200 mg by mouth 2 (two) times daily.   methotrexate  (RHEUMATREX) 2.5 MG tablet Take 20 mg by mouth once a week.   pantoprazole  (PROTONIX ) 40 MG tablet Take 1 tablet (40 mg total) by mouth 2 (two) times daily.   pregabalin  (LYRICA ) 150 MG capsule Take 1 capsule (150 mg total) by mouth at bedtime.   Tocilizumab (ACTEMRA ACTPEN) 162 MG/0.9ML SOAJ Inject into the skin.   vitamin B-12 (CYANOCOBALAMIN ) 100 MCG tablet Take 100 mcg by mouth daily.   meloxicam  (MOBIC ) 15 MG tablet Take 15 mg by mouth daily.   No facility-administered encounter medications on file as of 03/08/2024.  : Review of Systems:  Out of a complete 14 point review of systems, all are reviewed and negative with the exception of these symptoms as listed below:   Review of Systems  Neurological:        Here for sleep consult.  Had sleep study [employer] 47yrs ago.  Never used cpap.  Has old machine, believes  resmed.  ESS 10, FSS 16.     Objective:  Neurological Exam  Physical Exam Physical Examination:   Vitals:   03/08/24 1337  BP: (!) 140/91  Pulse: 80    General Examination: The patient is a very pleasant 63 y.o. female in no acute distress. She appears well-developed and well-nourished and well groomed.   HEENT: Normocephalic, atraumatic, pupils are equal, round and reactive to light, extraocular tracking is good without limitation to gaze excursion or nystagmus noted. Hearing is grossly intact. Face is symmetric with normal facial animation. Speech is clear with no dysarthria noted. There is no hypophonia. There is no lip, neck/head, jaw or voice tremor. Neck is supple with full range of passive and active motion. There are no carotid bruits on auscultation. Oropharynx exam reveals: mild mouth dryness, adequate dental hygiene and mild airway crowding, due to small airway entry, slightly wider tongue, Mallampati class I, tonsillar size small.  Tongue protrudes centrally and palate elevates symmetrically.  Chest: Clear to auscultation without wheezing, rhonchi or crackles noted.  Heart: S1+S2+0, regular and normal without murmurs, rubs or gallops noted.   Abdomen: Soft, non-tender and non-distended.  Extremities: There is no pitting edema in the distal lower extremities bilaterally.   Skin: Warm and dry without trophic changes noted.   Musculoskeletal: exam reveals no obvious joint deformities, unremarkable knee replacement surgery scars bilaterally.   Neurologically:  Mental status: The patient is awake, alert and oriented in all 4 spheres. Her immediate and remote memory, attention, language skills and fund of knowledge are appropriate. There is no evidence of aphasia, agnosia, apraxia or anomia. Speech is clear with normal prosody and enunciation. Thought process is linear. Mood is normal and affect is normal.  Cranial nerves II - XII are as described above under HEENT exam.   Motor exam: Normal bulk, strength and tone is noted. There is no obvious action or resting tremor.  Fine motor skills and coordination: grossly intact.  Cerebellar testing:  No dysmetria or intention tremor. There is no truncal or gait ataxia.  Sensory exam: intact to light touch in the upper and lower extremities.  Gait, station and balance: She stands easily. No veering to one side is noted. No leaning to one side is noted. Posture is age-appropriate and stance is narrow based. Gait shows normal stride length and normal pace. No problems turning are noted.   Assessment and Plan:  In summary, Erin Contreras is a very pleasant 63 y.o.-year old female 63 year old female with an underlying medical history of rheumatoid arthritis, low back pain, osteoarthritis, hypertension, prediabetes, reflux disease, overactive bladder, and severe obesity with a BMI of over 45, whose history and physical exam are concerning for sleep disordered breathing, particularly obstructive sleep apnea (OSA). A laboratory attended sleep study is typically considered gold standard for evaluation of sleep disordered breathing.   I had a long chat with the patient and her daughter about my findings and the diagnosis of sleep apnea , particularly OSA, its prognosis and treatment options. We talked about medical/conservative treatments, surgical interventions and non-pharmacological approaches for symptom control. I explained, in particular, the risks and ramifications of untreated moderate to severe OSA, especially with respect to developing cardiovascular disease down the road, including congestive heart failure (CHF), difficult to treat hypertension, cardiac arrhythmias (particularly A-fib), neurovascular complications including TIA, stroke and dementia. Even type 2 diabetes has, in part, been linked to untreated OSA. Symptoms of untreated OSA may include (but may not be limited to) daytime sleepiness, nocturia (i.e. frequent  nighttime urination), memory problems, mood irritability and suboptimally controlled or worsening mood disorder such as depression and/or anxiety, lack of energy, lack of motivation, physical discomfort, as well as recurrent headaches, especially morning or nocturnal headaches. We talked about the importance of maintaining a healthy lifestyle and striving for healthy weight. In addition, we talked about the importance of striving for and maintaining good sleep hygiene. I recommended a sleep study at this time. I outlined the differences between a laboratory attended sleep study which is considered more comprehensive and accurate over the option of a home sleep test (HST); the latter may lead to underestimation of sleep disordered breathing in some instances and does not help with diagnosing upper airway resistance syndrome and is not accurate enough to diagnose primary central sleep apnea typically. I outlined possible surgical and non-surgical treatment options of OSA, including the use of a positive airway pressure (PAP) device (i.e. CPAP, AutoPAP/APAP or BiPAP in certain circumstances), a custom-made dental device (aka oral appliance, which would require a referral to a specialist dentist or orthodontist typically, and is generally speaking not considered for patients with full dentures or edentulous state), upper airway surgical options, such as traditional UPPP (which is not considered a first-line treatment) or the Inspire device (hypoglossal nerve stimulator, which would involve a referral for consultation with an ENT surgeon, after careful selection, following inclusion criteria - also not first-line treatment). I explained the PAP treatment option to the patient in detail, as this is generally considered first-line treatment.  The patient indicated that she would be willing to try PAP therapy again, if the need arises. I explained the importance of being compliant with PAP treatment, not only for  insurance purposes but primarily to improve patient's symptoms symptoms, and for the patient's long term health benefit, including to reduce Her cardiovascular risks longer-term.    We will pick up our discussion about the next steps and treatment options after testing.  We will  keep her posted as to the test results by phone call and/or MyChart messaging where possible.  We will plan to follow-up in sleep clinic accordingly as well.  I answered all their questions today and the patient and her daughter were in agreement.   I encouraged her to call with any interim questions, concerns, problems or updates or email us  through MyChart.  Generally speaking, sleep test authorizations may take up to 2 weeks, sometimes less, sometimes longer, the patient is encouraged to get in touch with us  if they do not hear back from the sleep lab staff directly within the next 2 weeks.  Thank you very much for allowing me to participate in the care of this nice patient. If I can be of any further assistance to you please do not hesitate to call me at [phone].  Sincerely,   True Mar, MD, PhD

## 2024-03-08 NOTE — Patient Instructions (Signed)

## 2024-03-12 ENCOUNTER — Encounter: Admitting: *Deleted

## 2024-03-12 DIAGNOSIS — R0602 Shortness of breath: Secondary | ICD-10-CM

## 2024-03-12 DIAGNOSIS — J849 Interstitial pulmonary disease, unspecified: Secondary | ICD-10-CM | POA: Diagnosis not present

## 2024-03-12 NOTE — Progress Notes (Signed)
 Daily Session Note  Patient Details  Name: Erin Contreras MRN: [id number] Date of Birth: May 13, 1961 Referring Provider:   Flowsheet Row Pulmonary Rehab from 02/16/2024 in [facility] Cardiac and Pulmonary Rehab  Referring Provider Brena Reusing, MD    Encounter Date: 03/12/2024  Check In:  Session Check In - 03/12/24 1352       Check-In   Supervising physician immediately available to respond to emergencies See telemetry face sheet for immediately available ER MD    Location [facility]-Cardiac & Pulmonary Rehab    Staff Present Maxon Conetta BS, [name];[name], BS, [name];[name], MS, [name];Meredith Tressa RN,[name]    Virtual Visit No    Medication changes reported     No    Fall or balance concerns reported    No    Warm-up and Cool-down Performed on first and last piece of equipment    Resistance Training Performed Yes    VAD Patient? No    PAD/SET Patient? No      Pain Assessment   Currently in Pain? No/denies             Social History   Tobacco Use  Smoking Status Never  Smokeless Tobacco Never    Goals Met:  Proper associated with RPD/PD & O2 Sat Independence with exercise equipment Using PLB without cueing & demonstrates good technique Exercise tolerated well No report of concerns or symptoms today Strength training completed today  Goals Unmet:  Not Applicable  Comments: Pt able to follow exercise prescription today without complaint.  Will continue to monitor for progression.    Dr. Oneil Pinal is Medical Director for [facility] Cardiac Rehabilitation.  Dr. Fuad Aleskerov is Medical Director for [facility] Pulmonary Rehabilitation.

## 2024-03-14 ENCOUNTER — Encounter

## 2024-03-14 DIAGNOSIS — J849 Interstitial pulmonary disease, unspecified: Secondary | ICD-10-CM | POA: Diagnosis not present

## 2024-03-14 DIAGNOSIS — R0602 Shortness of breath: Secondary | ICD-10-CM

## 2024-03-14 NOTE — Progress Notes (Signed)
 Daily Session Note  Patient Details  Name: Erin Contreras MRN: [id number] Date of Birth: [birth year] Referring Provider:   Flowsheet Row Pulmonary Rehab from 02/16/2024 in [facility] Cardiac and Pulmonary Rehab  Referring Provider Brena Reusing, MD    Encounter Date: 03/14/2024  Check In:  Session Check In - 03/14/24 1344       Check-In   Supervising physician immediately available to respond to emergencies See telemetry face sheet for immediately available ER MD    Location [facility]-Cardiac & Pulmonary Rehab    Staff Present Burnard Davenport RN,[name],MPA;Maxon Conetta BS, [name];Aleza Pew Dyane HECKLE, [name], [name];[name], BS, [name]    Virtual Visit No    Medication changes reported     No    Fall or balance concerns reported    No    Warm-up and Cool-down Performed on first and last piece of equipment    Resistance Training Performed Yes    VAD Patient? No    PAD/SET Patient? No      Pain Assessment   Currently in Pain? No/denies             Social History   Tobacco Use  Smoking Status Never  Smokeless Tobacco Never    Goals Met:  Proper associated with RPD/PD & O2 Sat Independence with exercise equipment Using PLB without cueing & demonstrates good technique Exercise tolerated well No report of concerns or symptoms today Strength training completed today  Goals Unmet:  Not Applicable  Comments: Pt able to follow exercise prescription today without complaint.  Will continue to monitor for progression.    Dr. Oneil Pinal is Medical Director for [facility] Cardiac Rehabilitation.  Dr. Fuad Aleskerov is Medical Director for [facility] Pulmonary Rehabilitation.

## 2024-03-19 ENCOUNTER — Encounter: Admitting: Emergency Medicine

## 2024-03-19 DIAGNOSIS — J849 Interstitial pulmonary disease, unspecified: Secondary | ICD-10-CM

## 2024-03-19 DIAGNOSIS — R0602 Shortness of breath: Secondary | ICD-10-CM

## 2024-03-19 NOTE — Progress Notes (Signed)
 Daily Session Note  Patient Details  Name: Erin Contreras MRN: [id number] Date of Birth: 29-Sep-1960 Referring Provider:   Flowsheet Row Pulmonary Rehab from 02/16/2024 in [facility] Cardiac and Pulmonary Rehab  Referring Provider Brena Reusing, MD    Encounter Date: 03/19/2024  Check In:  Session Check In - 03/19/24 1357       Check-In   Supervising physician immediately available to respond to emergencies See telemetry face sheet for immediately available ER MD    Location [facility]-Cardiac & Pulmonary Rehab    Staff Present Othel Durand, RN, [name], CCRP;Maxon Conetta BS, [name];Lauren Racquel Arkin RN,[name];[name], BS, [name]    Virtual Visit No    Medication changes reported     No    Fall or balance concerns reported    No    Warm-up and Cool-down Performed on first and last piece of equipment    Resistance Training Performed Yes    VAD Patient? No    PAD/SET Patient? No      Pain Assessment   Currently in Pain? No/denies             Social History   Tobacco Use  Smoking Status Never  Smokeless Tobacco Never    Goals Met:  Proper associated with RPD/PD & O2 Sat Independence with exercise equipment Using PLB without cueing & demonstrates good technique Exercise tolerated well No report of concerns or symptoms today Strength training completed today  Goals Unmet:  Not Applicable  Comments: Pt able to follow exercise prescription today without complaint.  Will continue to monitor for progression.    Dr. Oneil Pinal is Medical Director for [facility] Cardiac Rehabilitation.  Dr. Fuad Aleskerov is Medical Director for [facility] Pulmonary Rehabilitation.

## 2024-03-21 ENCOUNTER — Encounter

## 2024-03-26 ENCOUNTER — Encounter: Admitting: Emergency Medicine

## 2024-03-26 DIAGNOSIS — R0602 Shortness of breath: Secondary | ICD-10-CM

## 2024-03-26 DIAGNOSIS — J849 Interstitial pulmonary disease, unspecified: Secondary | ICD-10-CM | POA: Diagnosis not present

## 2024-03-26 NOTE — Progress Notes (Signed)
 Daily Session Note  Patient Details  Name: Erin Contreras MRN: [id number] Date of Birth: January 02, 1961 Referring Provider:   Flowsheet Row Pulmonary Rehab from 02/16/2024 in [facility] Cardiac and Pulmonary Rehab  Referring Provider Brena Reusing, MD    Encounter Date: 03/26/2024  Check In:  Session Check In - 03/26/24 1357       Check-In   Supervising physician immediately available to respond to emergencies See telemetry face sheet for immediately available ER MD    Location [facility]-Cardiac & Pulmonary Rehab    Staff Present Rollene Paterson, MS, [name];[name], BS, [name];Maxon Conetta BS, [name];Othell Jaime RN,[name]    Virtual Visit No    Medication changes reported     No    Fall or balance concerns reported    No    Warm-up and Cool-down Performed on first and last piece of equipment    Resistance Training Performed Yes    VAD Patient? No    PAD/SET Patient? No      Pain Assessment   Currently in Pain? No/denies             Social History   Tobacco Use  Smoking Status Never  Smokeless Tobacco Never    Goals Met:  Proper associated with RPD/PD & O2 Sat Independence with exercise equipment Using PLB without cueing & demonstrates good technique Exercise tolerated well No report of concerns or symptoms today Strength training completed today  Goals Unmet:  Not Applicable  Comments: Pt able to follow exercise prescription today without complaint.  Will continue to monitor for progression.    Dr. Oneil Pinal is Medical Director for [facility] Cardiac Rehabilitation.  Dr. Fuad Aleskerov is Medical Director for [facility] Pulmonary Rehabilitation.

## 2024-03-28 ENCOUNTER — Encounter: Admitting: Emergency Medicine

## 2024-03-28 ENCOUNTER — Telehealth: Payer: Self-pay | Admitting: Neurology

## 2024-03-28 DIAGNOSIS — R0602 Shortness of breath: Secondary | ICD-10-CM

## 2024-03-28 DIAGNOSIS — J849 Interstitial pulmonary disease, unspecified: Secondary | ICD-10-CM

## 2024-03-28 NOTE — Progress Notes (Signed)
 Daily Session Note  Patient Details  Name: Erin Contreras MRN: [id number] Date of Birth: [birth year] Referring Provider:   Flowsheet Row Pulmonary Rehab from 02/16/2024 in [facility] Cardiac and Pulmonary Rehab  Referring Provider Brena Reusing, MD    Encounter Date: 03/28/2024  Check In:  Session Check In - 03/28/24 1400       Check-In   Supervising physician immediately available to respond to emergencies See telemetry face sheet for immediately available ER MD    Location [facility]-Cardiac & Pulmonary Rehab    Staff Present Burnard Hint BS, [name], [name];[name], BS, [name];Kenyotta Dorfman RN,[name];Kelly Bollinger [facility]    Virtual Visit No    Medication changes reported     No    Fall or balance concerns reported    No    Warm-up and Cool-down Performed on first and last piece of equipment    Resistance Training Performed Yes    VAD Patient? No    PAD/SET Patient? No      Pain Assessment   Currently in Pain? No/denies             Social History   Tobacco Use  Smoking Status Never  Smokeless Tobacco Never    Goals Met:  Proper associated with RPD/PD & O2 Sat Independence with exercise equipment Using PLB without cueing & demonstrates good technique Exercise tolerated well No report of concerns or symptoms today Strength training completed today  Goals Unmet:  Not Applicable  Comments: Pt able to follow exercise prescription today without complaint.  Will continue to monitor for progression.    Dr. Oneil Pinal is Medical Director for [facility] Cardiac Rehabilitation.  Dr. Fuad Aleskerov is Medical Director for [facility] Pulmonary Rehabilitation.

## 2024-03-28 NOTE — Progress Notes (Signed)
 Pulmonary Individual Treatment Plan  Patient Details  Name: Erin Contreras MRN: [id number] Date of Birth: 11-21-1960 Referring Provider:   Flowsheet Row Pulmonary Rehab from 02/16/2024 in [facility] Cardiac and Pulmonary Rehab  Referring Provider Brena Reusing, MD    Initial Encounter Date:  Flowsheet Row Pulmonary Rehab from 02/16/2024 in [facility] Cardiac and Pulmonary Rehab  Date 02/16/24    Visit Diagnosis: Interstitial lung disease (HCC)  Shortness of breath  Patient's Home Medications on Admission:  Current Outpatient Medications:    acyclovir  (ZOVIRAX ) 400 MG tablet, Take 1 tablet (400 mg total) by mouth 2 (two) times daily as needed., Disp: 180 tablet, Rfl: 4   Cholecalciferol  1.25 MG (50000 UT) TABS, Take 1 tablet by mouth once a week., Disp: 12 tablet, Rfl: 4   conjugated estrogens  (PREMARIN ) vaginal cream, Place 0.25 Applicatorfuls vaginally at bedtime for 14 days, THEN 0.25 Applicatorfuls every other day for 14 days, THEN 0.25 Applicatorfuls 2 (two) times a week., Disp: 42.5 g, Rfl: 12   DULoxetine  (CYMBALTA ) 20 MG capsule, Take 2 capsules (40 mg total) by mouth every morning., Disp: 60 capsule, Rfl: 2   famotidine  (PEPCID ) 40 MG tablet, Take 1 tablet (40 mg total) by mouth at bedtime., Disp: 90 tablet, Rfl: 3   folic acid  (FOLVITE ) 1 MG tablet, Take 1 mg by mouth daily., Disp: , Rfl:    hydroxychloroquine (PLAQUENIL) 200 MG tablet, Take 200 mg by mouth 2 (two) times daily., Disp: , Rfl:    meloxicam  (MOBIC ) 15 MG tablet, Take 15 mg by mouth daily., Disp: , Rfl:    methotrexate  (RHEUMATREX) 2.5 MG tablet, Take 20 mg by mouth once a week., Disp: , Rfl:    pantoprazole  (PROTONIX ) 40 MG tablet, Take 1 tablet (40 mg total) by mouth 2 (two) times daily., Disp: 180 tablet, Rfl: 3   pregabalin  (LYRICA ) 150 MG capsule, Take 1 capsule (150 mg total) by mouth at bedtime., Disp: 30 capsule, Rfl: 5   Tocilizumab (ACTEMRA ACTPEN) 162 MG/0.9ML SOAJ, Inject into the skin., Disp: , Rfl:     vitamin B-12 (CYANOCOBALAMIN ) 100 MCG tablet, Take 100 mcg by mouth daily., Disp: , Rfl:   Past Medical History: Past Medical History:  Diagnosis Date   Boils    Ear mass    GERD (gastroesophageal reflux disease)    Hemorrhoids    Herpes simplex virus infection    HSV infection    Lumbago with sciatica    OA (osteoarthritis) of knee    Obesity    Overactive bladder    PONV (postoperative nausea and vomiting)    Pre-diabetes    RA (rheumatoid arthritis) (HCC)    Sleep apnea    does not use cpap-had bariatric surgery and has no issues since surgery   Urge and stress incontinence     Tobacco Use: Social History   Tobacco Use  Smoking Status Never  Smokeless Tobacco Never    Labs: Review Flowsheet  More data exists      Latest Ref Rng & Units 04/09/2022 10/08/2022 01/25/2023 07/28/2023 12/06/2023  Labs for ITP Cardiac and Pulmonary Rehab  Cholestrol 100 - 199 mg/dL 757  792  815  813  771   LDL (calc) 0 - 99 mg/dL 859  883  898  891  857   HDL-C >39 mg/dL 78  65  61  60  62   Trlycerides 0 - 149 mg/dL 859  848  874  899  863   Hemoglobin A1c 4.8 - 5.6 %  6.2  6.2  6.3  6.1  5.8      Pulmonary Assessment Scores:  Pulmonary Assessment Scores     Row Name 02/16/24 1025         ADL UCSD   ADL Phase Entry     SOB Score total 62     Rest 3     Walk 2     Stairs 2     Bath 2     Dress 2     Shop 3       CAT Score   CAT Score 11       mMRC Score   mMRC Score 4        UCSD: Self-administered rating of dyspnea associated with activities of daily living (ADLs) 6-point scale (0 = not at all to 5 = maximal or unable to do because of breathlessness)  Scoring Scores range from 0 to 120.  Minimally important difference is 5 units  CAT: CAT can identify the health impairment of COPD patients and is better correlated with disease progression.  CAT has a scoring range of zero to 40. The CAT score is classified into four groups of low (less than 10), medium (10 - 20),  high (21-30) and very high (31-40) based on the impact level of disease on health status. A CAT score over 10 suggests significant symptoms.  A worsening CAT score could be explained by an exacerbation, poor medication adherence, poor inhaler technique, or progression of COPD or comorbid conditions.  CAT MCID is 2 points  mMRC: mMRC (Modified Medical Research Council) Dyspnea Scale is used to assess the degree of baseline functional disability in patients of respiratory disease due to dyspnea. No minimal important difference is established. A decrease in score of 1 point or greater is considered a positive change.   Pulmonary Function Assessment:   Exercise Target Goals: Exercise Program Goal: Individual exercise prescription set using results from initial 6 min walk test and THRR while considering  patient's activity barriers and safety.   Exercise Prescription Goal: Initial exercise prescription builds to 30-45 minutes a day of aerobic activity, 2-3 days per week.  Home exercise guidelines will be given to patient during program as part of exercise prescription that the participant will acknowledge.  Education: Aerobic Exercise: - Group verbal and visual presentation on the components of exercise prescription. Introduces F.I.T.T principle from ACSM for exercise prescriptions.  Reviews F.I.T.T. principles of aerobic exercise including progression. Written material provided at class time.   Education: Resistance Exercise: - Group verbal and visual presentation on the components of exercise prescription. Introduces F.I.T.T principle from ACSM for exercise prescriptions  Reviews F.I.T.T. principles of resistance exercise including progression. Written material provided at class time. Flowsheet Row Pulmonary Rehab from 03/14/2024 in [facility] Cardiac and Pulmonary Rehab  Date 02/29/24  Educator [facility]  Instruction Review Code 1- [employer] Understanding     Education: Exercise & Equipment Safety: -  Individual verbal instruction and demonstration of equipment use and safety with use of the equipment. Flowsheet Row Pulmonary Rehab from 02/16/2024 in [facility] Cardiac and Pulmonary Rehab  Date 02/16/24  Educator [facility]  Instruction Review Code 1- Verbalizes Understanding    Education: Exercise Physiology & General Exercise Guidelines: - Group verbal and written instruction with models to review the exercise physiology of the cardiovascular system and associated critical values. Provides general exercise guidelines with specific guidelines to those with heart or lung disease.  Flowsheet Row Pulmonary Rehab from 03/14/2024 in [facility] Cardiac  and Pulmonary Rehab  Education need identified 02/27/24    Education: Flexibility, Balance, Mind/Body Relaxation: - Group verbal and visual presentation with interactive activity on the components of exercise prescription. Introduces F.I.T.T principle from ACSM for exercise prescriptions. Reviews F.I.T.T. principles of flexibility and balance exercise training including progression. Also discusses the mind body connection.  Reviews various relaxation techniques to help reduce and manage stress (i.e. Deep breathing, progressive muscle relaxation, and visualization). Balance handout provided to take home. Written material provided at class time. Flowsheet Row Pulmonary Rehab from 03/14/2024 in [facility] Cardiac and Pulmonary Rehab  Date 02/29/24  Educator [facility]  Instruction Review Code 1- Verbalizes Understanding    Activity Barriers & Risk Stratification:  Activity Barriers & Cardiac Risk Stratification - 02/16/24 1016       Activity Barriers & Cardiac Risk Stratification   Activity Barriers Arthritis;Joint Problems;Shortness of Breath;History of Falls;Left Knee Replacement;Right Knee Replacement          6 Minute Walk:  6 Minute Walk     Row Name 02/16/24 1015         6 Minute Walk   Phase Initial     Distance 1100 feet     Walk Time 6 minutes     # of Rest  Breaks 0     MPH 2.08     METS 2.34     RPE 11     Perceived Dyspnea  0     VO2 Peak 8.19     Symptoms No     Resting HR 78 bpm     Resting BP 126/80     Resting Oxygen Saturation  93 %     Exercise Oxygen Saturation  during 6 min walk 90 %     Max Ex. HR 125 bpm     Max Ex. BP 160/80     2 Minute Post BP 118/80       Interval HR   1 Minute HR 90     2 Minute HR 106     3 Minute HR 125     4 Minute HR 108     5 Minute HR 101     6 Minute HR 107     2 Minute Post HR 88     Interval Heart Rate? Yes       Interval Oxygen   Interval Oxygen? Yes     Baseline Oxygen Saturation % 93 %     1 Minute Oxygen Saturation % 93 %     1 Minute Liters of Oxygen 0 L     2 Minute Oxygen Saturation % 91 %     2 Minute Liters of Oxygen 0 L     3 Minute Oxygen Saturation % 91 %     3 Minute Liters of Oxygen 0 L     4 Minute Oxygen Saturation % 91 %     4 Minute Liters of Oxygen 0 L     5 Minute Oxygen Saturation % 90 %     5 Minute Liters of Oxygen 0 L     6 Minute Oxygen Saturation % 90 %     6 Minute Liters of Oxygen 0 L     2 Minute Post Oxygen Saturation % 94 %     2 Minute Post Liters of Oxygen 0 L       Oxygen Initial Assessment:  Oxygen Initial Assessment - 02/13/24 1346       Home Oxygen  Home Oxygen Device None    Sleep Oxygen Prescription CPAP   not using,has an appt scheduled to be fitted for new CPAP   Home Exercise Oxygen Prescription None    Home Resting Oxygen Prescription None      Intervention   Short Term Goals To learn and exhibit compliance with exercise, home and travel O2 prescription;To learn and understand importance of monitoring SPO2 with pulse oximeter and demonstrate accurate use of the pulse oximeter.;To learn and understand importance of maintaining oxygen saturations>88%;To learn and demonstrate proper pursed lip breathing techniques or other breathing techniques. ;To learn and demonstrate proper use of respiratory medications    Long  Term Goals  Exhibits compliance with exercise, home  and travel O2 prescription;Verbalizes importance of monitoring SPO2 with pulse oximeter and return demonstration;Maintenance of O2 saturations>88%;Exhibits proper breathing techniques, such as pursed lip breathing or other method taught during program session;Compliance with respiratory medication          Oxygen Re-Evaluation:  Oxygen Re-Evaluation     Row Name 02/27/24 1414 03/26/24 1359           Home Oxygen   Home Oxygen Device -- None      Sleep Oxygen Prescription -- CPAP      Home Exercise Oxygen Prescription -- None      Home Resting Oxygen Prescription -- None        Goals/Expected Outcomes   Short Term Goals -- To learn and demonstrate proper pursed lip breathing techniques or other breathing techniques.       Long  Term Goals -- Exhibits proper breathing techniques, such as pursed lip breathing or other method taught during program session      Comments Reviewed PLB technique with pt.  Talked about how it works and it's importance in maintaining their exercise saturations. Reviewed PLB technique with pt. Talked about how it works and it's importance in maintaining their exercise saturations.      Goals/Expected Outcomes Short: Become more profiecient at using PLB.   Long: Become independent at using PLB. Short: Become more profiecient at using PLB. Long: Become independent at using PLB.         Oxygen Discharge (Final Oxygen Re-Evaluation):  Oxygen Re-Evaluation - 03/26/24 1359       Home Oxygen   Home Oxygen Device None    Sleep Oxygen Prescription CPAP    Home Exercise Oxygen Prescription None    Home Resting Oxygen Prescription None      Goals/Expected Outcomes   Short Term Goals To learn and demonstrate proper pursed lip breathing techniques or other breathing techniques.     Long  Term Goals Exhibits proper breathing techniques, such as pursed lip breathing or other method taught during program session    Comments  Reviewed PLB technique with pt. Talked about how it works and it's importance in maintaining their exercise saturations.    Goals/Expected Outcomes Short: Become more profiecient at using PLB. Long: Become independent at using PLB.          Initial Exercise Prescription:  Initial Exercise Prescription - 02/16/24 1000       Date of Initial Exercise RX and Referring Provider   Date 02/16/24    Referring Provider Brena Reusing, MD      Oxygen   Maintain Oxygen Saturation 88% or higher      Treadmill   MPH 1.8   Try   Grade 0    Minutes 15    METs 2.38  Recumbant Bike   Level 2    RPM 50    Watts 25    Minutes 15    METs 2.34      REL-XR   Level 1    Speed 50    Minutes 15    METs 2.34      T5 Nustep   Level 2    SPM 80    Minutes 15    METs 2.34      Track   Laps 25    Minutes 15    METs 2.36      Prescription Details   Frequency (times per week) 2    Duration Progress to 30 minutes of continuous aerobic without signs/symptoms of physical distress      Intensity   THRR 40-80% of Max Heartrate 109-140    Ratings of Perceived Exertion 11-13    Perceived Dyspnea 0-4      Progression   Progression Continue to progress workloads to maintain intensity without signs/symptoms of physical distress.      Resistance Training   Training Prescription Yes    Weight 4 lb    Reps 10-15          Perform Capillary Blood Glucose checks as needed.  Exercise Prescription Changes:   Exercise Prescription Changes     Row Name 02/16/24 1000 03/12/24 1700           Response to Exercise   Blood Pressure (Admit) 126/80 122/70      Blood Pressure (Exercise) 160/80 170/90      Blood Pressure (Exit) 118/80 124/68      Heart Rate (Admit) 78 bpm 88 bpm      Heart Rate (Exercise) 125 bpm 121 bpm      Heart Rate (Exit) 81 bpm 87 bpm      Oxygen Saturation (Admit) 93 % 94 %      Oxygen Saturation (Exercise) 90 % 90 %      Oxygen Saturation (Exit) 94 % 94 %       Rating of Perceived Exertion (Exercise) 11 13      Perceived Dyspnea (Exercise) 0 0      Symptoms none none      Comments results 1st 2 weeks of exercise sessions      Duration -- Progress to 30 minutes of  aerobic without signs/symptoms of physical distress      Intensity THRR New THRR unchanged        Progression   Progression -- Continue to progress workloads to maintain intensity without signs/symptoms of physical distress.      Average METs 2.34 2.26        Resistance Training   Training Prescription -- Yes      Weight -- 4 lb      Reps -- 10-15        Interval Training   Interval Training -- No        Treadmill   MPH -- 1.8      Grade -- 0      Minutes -- 15      METs -- 2.38        Recumbant Bike   Level -- 3      Watts -- 15      Minutes -- 15      METs -- 2.34        REL-XR   Level -- 2      Minutes -- 15  METs -- 2.9        T5 Nustep   Level -- 2      Minutes -- 15      METs -- 1.8        Oxygen   Maintain Oxygen Saturation -- 88% or higher         Exercise Comments:   Exercise Comments     Row Name 02/27/24 1412           Exercise Comments First full day of exercise!  Patient was oriented to gym and equipment including functions, settings, policies, and procedures.  Patient's individual exercise prescription and treatment plan were reviewed.  All starting workloads were established based on the results of the 6 minute walk test done at initial orientation visit.  The plan for exercise progression was also introduced and progression will be customized based on patient's performance and goals.          Exercise Goals and Review:   Exercise Goals     Row Name 02/16/24 1020             Exercise Goals   Increase Physical Activity Yes       Intervention Provide advice, education, support and counseling about physical activity/exercise needs.;Develop an individualized exercise prescription for aerobic and resistive training  based on initial evaluation findings, risk stratification, comorbidities and participant's personal goals.       Expected Outcomes Short Term: Attend rehab on a regular basis to increase amount of physical activity.;Long Term: Add in home exercise to make exercise part of routine and to increase amount of physical activity.;Long Term: Exercising regularly at least 3-5 days a week.       Increase Strength and Stamina Yes       Intervention Provide advice, education, support and counseling about physical activity/exercise needs.;Develop an individualized exercise prescription for aerobic and resistive training based on initial evaluation findings, risk stratification, comorbidities and participant's personal goals.       Expected Outcomes Short Term: Increase workloads from initial exercise prescription for resistance, speed, and METs.;Short Term: Perform resistance training exercises routinely during rehab and add in resistance training at home;Long Term: Improve cardiorespiratory fitness, muscular endurance and strength as measured by increased METs and functional capacity ( )       Able to understand and use rate of perceived exertion (RPE) scale Yes       Intervention Provide education and explanation on how to use RPE scale       Expected Outcomes Short Term: Able to use RPE daily in rehab to express subjective intensity level;Long Term:  Able to use RPE to guide intensity level when exercising independently       Able to understand and use Dyspnea scale Yes       Intervention Provide education and explanation on how to use Dyspnea scale       Expected Outcomes Short Term: Able to use Dyspnea scale daily in rehab to express subjective sense of shortness of breath during exertion;Long Term: Able to use Dyspnea scale to guide intensity level when exercising independently       Knowledge and understanding of Target Heart Rate Range (THRR) Yes       Intervention Provide education and explanation of  THRR including how the numbers were predicted and where they are located for reference       Expected Outcomes Short Term: Able to state/look up THRR;Short Term: Able to use daily as guideline for intensity in rehab;Long  Term: Able to use THRR to govern intensity when exercising independently       Able to check pulse independently Yes       Intervention Provide education and demonstration on how to check pulse in carotid and radial arteries.;Review the importance of being able to check your own pulse for safety during independent exercise       Expected Outcomes Short Term: Able to explain why pulse checking is important during independent exercise;Long Term: Able to check pulse independently and accurately       Understanding of Exercise Prescription Yes       Intervention Provide education, explanation, and written materials on patient's individual exercise prescription       Expected Outcomes Short Term: Able to explain program exercise prescription;Long Term: Able to explain home exercise prescription to exercise independently          Exercise Goals Re-Evaluation :  Exercise Goals Re-Evaluation     Row Name 02/27/24 1413 03/12/24 1712 03/26/24 1357         Exercise Goal Re-Evaluation   Exercise Goals Review Increase Physical Activity;Knowledge and understanding of Target Heart Rate Range (THRR);Able to understand and use rate of perceived exertion (RPE) scale;Understanding of Exercise Prescription;Increase Strength and Stamina;Able to check pulse independently Increase Physical Activity;Understanding of Exercise Prescription;Increase Strength and Stamina --     Comments Reviewed RPE and dyspnea scale, THR and program prescription with pt today.  Pt voiced understanding and was given a copy of goals to take home. Erin Contreras is off to a good start in the program and completed her first 2 weeks of exercise sessions in this review. She had workload on the treadmill of a speed of 1.8 mph with no  incline. She worked at level 2 on the T5 nustep and XR. She worked at level 3 on the recumbent bike. We will continue to monitor her progress in the program. Erin Contreras is doing well here at rehab. She is doing line dancing 2 days per week on top of rehab exercise. Encouraged her to contineu to exercise at home, and focus on increasing workload here at rehab.     Expected Outcomes Short: Use RPE daily to regulate intensity.  Long: Follow program prescription in THR. Short: Continue to follow current exercise prescription. Long: Continue exercise to improve strength and stamina. STG: Continue to follow current exercise prescription. LTG: Continue exercise to improve strength and stamina.        Discharge Exercise Prescription (Final Exercise Prescription Changes):  Exercise Prescription Changes - 03/12/24 1700       Response to Exercise   Blood Pressure (Admit) 122/70    Blood Pressure (Exercise) 170/90    Blood Pressure (Exit) 124/68    Heart Rate (Admit) 88 bpm    Heart Rate (Exercise) 121 bpm    Heart Rate (Exit) 87 bpm    Oxygen Saturation (Admit) 94 %    Oxygen Saturation (Exercise) 90 %    Oxygen Saturation (Exit) 94 %    Rating of Perceived Exertion (Exercise) 13    Perceived Dyspnea (Exercise) 0    Symptoms none    Comments 1st 2 weeks of exercise sessions    Duration Progress to 30 minutes of  aerobic without signs/symptoms of physical distress    Intensity THRR unchanged      Progression   Progression Continue to progress workloads to maintain intensity without signs/symptoms of physical distress.    Average METs 2.26      Resistance Training  Training Prescription Yes    Weight 4 lb    Reps 10-15      Interval Training   Interval Training No      Treadmill   MPH 1.8    Grade 0    Minutes 15    METs 2.38      Recumbant Bike   Level 3    Watts 15    Minutes 15    METs 2.34      REL-XR   Level 2    Minutes 15    METs 2.9      T5 Nustep   Level 2    Minutes  15    METs 1.8      Oxygen   Maintain Oxygen Saturation 88% or higher          Nutrition:  Target Goals: Understanding of nutrition guidelines, daily intake of sodium 1500mg , cholesterol 200mg , calories 30% from fat and 7% or less from saturated fats, daily to have 5 or more servings of fruits and vegetables.  Education: Nutrition 1 -Group instruction provided by verbal, written material, interactive activities, discussions, models, and posters to present general guidelines for heart healthy nutrition including macronutrients, label reading, and promoting whole foods over processed counterparts. Education serves as Pensions consultant of discussion of heart healthy eating for all. Written material provided at class time. Flowsheet Row Pulmonary Rehab from 03/14/2024 in [facility] Cardiac and Pulmonary Rehab  Date 03/14/24  Educator jg  Instruction Review Code 1- Verbalizes Understanding      Education: Nutrition 2 -Group instruction provided by verbal, written material, interactive activities, discussions, models, and posters to present general guidelines for heart healthy nutrition including sodium, cholesterol, and saturated fat. Providing guidance of habit forming to improve blood pressure, cholesterol, and body weight. Written material provided at class time.     Biometrics:  Pre Biometrics - 02/16/24 1024       Pre Biometrics   Height 5' 9.5 (1.765 m)    Weight 308 lb 12.8 oz (140.1 kg)    Waist Circumference 53.9 inches    Hip Circumference 62 inches    Waist to Hip Ratio 0.87 %    BMI (Calculated) 44.96    Single Leg Stand 12.5 seconds           Nutrition Therapy Plan and Nutrition Goals:  Nutrition Therapy & Goals - 02/27/24 1534       Nutrition Therapy   Diet Cardiac, Low Na    Protein (specify units) 80g    Fiber 25 grams    Whole Grain Foods 3 servings    Saturated Fats 15 max. grams    Fruits and Vegetables 5 servings/day    Sodium 2 grams      Personal  Nutrition Goals   Nutrition Goal Eat 3 times per day, small frequent meals or nutrient dense snacks    Personal Goal #2 Drink 48-64oz of non sugary beverages (not during meals)    Personal Goal #3 Get junk food out of house and build habits around not eating junk on daily basis.    Comments Erin Contreras has had bariatric surgery ~9years ago. She has gained the weight back. She reports she struggles with consistency. Says some weeks she will make good food choices, but then the next week she makes poor choices. She reports she likes junk food and eats it often at night. Spoke with her about if it was a sweet craving or habitual. She felt it was habitual. Recommended she  not buy any junk food and cut out sugary beverages. She drinks ~32-48oz of water. Suggested she aim for most of her fluids be water and if she wants sometime different it should be a not caloric beverage. She wants to find a weight loss plan that is sustainable and healthy. Spoke with her about foods and what a nutrition plan could look like. She is interested in making changes, RD will work on filling in knowledge gaps and checking in throughout rehab program to assess progress and consistency towards nutrition goals. Provided mediterranean diet handout, educated on types of fats, sources, and how to read labels. Brainstormed several protein carb paired meals and snacks with foods she likes and will eat.      Intervention Plan   Intervention Prescribe, educate and counsel regarding individualized specific dietary modifications aiming towards targeted core components such as weight, hypertension, lipid management, diabetes, heart failure and other comorbidities.;Nutrition handout(s) given to patient.    Expected Outcomes Long Term Goal: Adherence to prescribed nutrition plan.;Short Term Goal: A plan has been developed with personal nutrition goals set during dietitian appointment.;Short Term Goal: Understand basic principles of dietary content, such  as calories, fat, sodium, cholesterol and nutrients.          Nutrition Assessments:  MEDIFICTS Score Key: >=70 Need to make dietary changes  40-70 Heart Healthy Diet <= 40 Therapeutic Level Cholesterol Diet  Flowsheet Row Pulmonary Rehab from 02/27/2024 in [facility] Cardiac and Pulmonary Rehab  Picture Your Plate Total Score on Admission 39   Picture Your Plate Scores: <59 Unhealthy dietary pattern with much room for improvement. 41-50 Dietary pattern unlikely to meet recommendations for good health and room for improvement. 51-60 More healthful dietary pattern, with some room for improvement.  >60 Healthy dietary pattern, although there may be some specific behaviors that could be improved.   Nutrition Goals Re-Evaluation:  Nutrition Goals Re-Evaluation     Row Name 03/26/24 1402             Goals   Comment Erin Contreras is doing better with nutrition. She is cutting back on sugar. Asked about several foods and how best to use them. Suggested a few meal/snack ideas to help her stick with impactful and healthy food choices.       Expected Outcome STG: cut back on excessive sugar. LTG: Follow a healthy diet that supports a healthy weight          Nutrition Goals Discharge (Final Nutrition Goals Re-Evaluation):  Nutrition Goals Re-Evaluation - 03/26/24 1402       Goals   Comment Erin Contreras is doing better with nutrition. She is cutting back on sugar. Asked about several foods and how best to use them. Suggested a few meal/snack ideas to help her stick with impactful and healthy food choices.    Expected Outcome STG: cut back on excessive sugar. LTG: Follow a healthy diet that supports a healthy weight          Psychosocial: Target Goals: Acknowledge presence or absence of significant depression and/or stress, maximize coping skills, provide positive support system. Participant is able to verbalize types and ability to use techniques and skills needed for reducing stress and  depression.   Education: Stress, Anxiety, and Depression - Group verbal and visual presentation to define topics covered.  Reviews how body is impacted by stress, anxiety, and depression.  Also discusses healthy ways to reduce stress and to treat/manage anxiety and depression.  Written material provided at class time.  Education: Sleep Hygiene -Provides group verbal and written instruction about how sleep can affect your health.  Define sleep hygiene, discuss sleep cycles and impact of sleep habits. Review good sleep hygiene tips.    Initial Review & Psychosocial Screening:  Initial Psych Review & Screening - 02/13/24 1350       Initial Review   Current issues with History of Depression;Current Stress Concerns    Source of Stress Concerns Chronic Illness    Comments --      Family Dynamics   Good Support System? Yes      Barriers   Psychosocial barriers to participate in program There are no identifiable barriers or psychosocial needs.;The patient should benefit from training in stress management and relaxation.      Screening Interventions   Interventions Encouraged to exercise    Expected Outcomes Short Term goal: Identification and review with participant of any Quality of Life or Depression concerns found by scoring the questionnaire.;Long Term goal: The participant improves quality of Life and PHQ9 Scores as seen by post scores and/or verbalization of changes          Quality of Life Scores:  Scores of 19 and below usually indicate a poorer quality of life in these areas.  A difference of  2-3 points is a clinically meaningful difference.  A difference of 2-3 points in the total score of the Quality of Life Index has been associated with significant improvement in overall quality of life, self-image, physical symptoms, and general health in studies assessing change in quality of life.  PHQ-9: Review Flowsheet  More data exists      03/26/2024 02/16/2024 01/30/2024 12/06/2023  11/01/2023  Depression screen PHQ 2/9  Decreased Interest 1 1 0 0 1  Down, Depressed, Hopeless 0 0 0 0 0  PHQ - 2 Score 1 1 0 0 1  Altered sleeping 1 2 2 3  -  Tired, decreased energy 2 3 3 2  -  Change in appetite 0 0 0 0 -  Feeling bad or failure about yourself  0 0 0 0 -  Trouble concentrating 0 0 0 0 -  Moving slowly or fidgety/restless 0 0 1 0 -  Suicidal thoughts 0 0 0 0 -  PHQ-9 Score 4 6 6 5  -  Difficult doing work/chores Not difficult at all Not difficult at all Not difficult at all Not difficult at all -   Interpretation of Total Score  Total Score Depression Severity:  1-4 = Minimal depression, 5-9 = Mild depression, 10-14 = Moderate depression, 15-19 = Moderately severe depression, 20-27 = Severe depression   Psychosocial Evaluation and Intervention:  Psychosocial Evaluation - 02/13/24 1400       Psychosocial Evaluation & Interventions   Interventions Encouraged to exercise with the program and follow exercise prescription    Comments Patient stated that she has had occasional periods of depression, however does not take medication specifically for that. Although, patient is prescribed an anti-depressant that is also used to help manage chronic pain. Patient stated she does have some stress occasionally related to regular everyday life occurrences, but nothing specific and not all of the time. Patient stated she has a good support system of family and friends. Patient is interested in improving her shortness of breath and learning ways to improve her health and weight through lifestyle changes such as diet and exercise.    Expected Outcomes Short Term: Attend pulmonary rehab for exercise and education. Long Term: Develop and maintain positive healthcare  habits.    Continue Psychosocial Services  Follow up required by staff          Psychosocial Re-Evaluation:  Psychosocial Re-Evaluation     Row Name 03/26/24 1400             Psychosocial Re-Evaluation   Current  issues with Current Sleep Concerns       Comments Erin Contreras reports her sleep has been improving, currently getting ~5-6hr of sleep per night. She has been focusing on sleeping, going to bed earlier       Expected Outcomes STG: continue to attend rehab and focus on good sleep. LTG: Achieve and maintain positive outlook on health and daily life       Interventions Encouraged to attend Pulmonary Rehabilitation for the exercise       Continue Psychosocial Services  Follow up required by staff          Psychosocial Discharge (Final Psychosocial Re-Evaluation):  Psychosocial Re-Evaluation - 03/26/24 1400       Psychosocial Re-Evaluation   Current issues with Current Sleep Concerns    Comments Erin Contreras reports her sleep has been improving, currently getting ~5-6hr of sleep per night. She has been focusing on sleeping, going to bed earlier    Expected Outcomes STG: continue to attend rehab and focus on good sleep. LTG: Achieve and maintain positive outlook on health and daily life    Interventions Encouraged to attend Pulmonary Rehabilitation for the exercise    Continue Psychosocial Services  Follow up required by staff          Education: Education Goals: Education classes will be provided on a weekly basis, covering required topics. Participant will state understanding/return demonstration of topics presented.  Learning Barriers/Preferences:  Learning Barriers/Preferences - 02/13/24 1349       Learning Barriers/Preferences   Learning Barriers None    Learning Preferences Video;Verbal Instruction;Individual Instruction;Group Instruction;Computer/Internet;Audio;Written Material          General Pulmonary Education Topics:  Infection Prevention: - Provides verbal and written material to individual with discussion of infection control including proper hand washing and proper equipment cleaning during exercise session. Flowsheet Row Pulmonary Rehab from 02/16/2024 in [facility] Cardiac and  Pulmonary Rehab  Date 02/16/24  Educator MB  Instruction Review Code 1- Verbalizes Understanding    Falls Prevention: - Provides verbal and written material to individual with discussion of falls prevention and safety. Flowsheet Row Pulmonary Rehab from 02/16/2024 in [facility] Cardiac and Pulmonary Rehab  Date 02/16/24  Educator MB  Instruction Review Code 1- Verbalizes Understanding    Chronic Lung Disease Review: - Group verbal instruction with posters, models, PowerPoint presentations and videos,  to review new updates, new respiratory medications, new advancements in procedures and treatments. Providing information on websites and 800 numbers for continued self-education. Includes information about supplement oxygen, available portable oxygen systems, continuous and intermittent flow rates, oxygen safety, concentrators, and Medicare reimbursement for oxygen. Explanation of Pulmonary Drugs, including class, frequency, complications, importance of spacers, rinsing mouth after steroid MDI's, and proper cleaning methods for nebulizers. Review of basic lung anatomy and physiology related to function, structure, and complications of lung disease. Review of risk factors. Discussion about methods for diagnosing sleep apnea and types of masks and machines for OSA. Includes a review of the use of types of environmental controls: home humidity, furnaces, filters, dust mite/pet prevention, HEPA vacuums. Discussion about weather changes, air quality and the benefits of nasal washing. Instruction on Warning signs, infection symptoms, calling MD promptly, preventive  modes, and value of vaccinations. Review of effective airway clearance, coughing and/or vibration techniques. Emphasizing that all should Create an Action Plan. Written material provided at class time. Flowsheet Row Pulmonary Rehab from 03/14/2024 in [facility] Cardiac and Pulmonary Rehab  Education need identified 02/27/24    AED/CPR: - Group verbal and  written instruction with the use of models to demonstrate the basic use of the AED with the basic ABC's of resuscitation.    Tests and Procedures:  - Group verbal and visual presentation and models provide information about basic cardiac anatomy and function. Reviews the testing methods done to diagnose heart disease and the outcomes of the test results. Describes the treatment choices: Medical Management, Angioplasty, or Coronary Bypass Surgery for treating various heart conditions including Myocardial Infarction, Angina, Valve Disease, and Cardiac Arrhythmias.  Written material provided at class time.   Medication Safety: - Group verbal and visual instruction to review commonly prescribed medications for heart and lung disease. Reviews the medication, class of the drug, and side effects. Includes the steps to properly store meds and maintain the prescription regimen.  Written material given at graduation.   Other: -Provides group and verbal instruction on various topics (see comments)   Knowledge Questionnaire Score:  Knowledge Questionnaire Score - 02/27/24 1701       Knowledge Questionnaire Score   Pre Score 14/18           Core Components/Risk Factors/Patient Goals at Admission:  Personal Goals and Risk Factors at Admission - 02/16/24 1026       Core Components/Risk Factors/Patient Goals on Admission    Weight Management Yes;Obesity;Weight Loss    Intervention Weight Management: Provide education and appropriate resources to help participant work on and attain dietary goals.;Weight Management/Obesity: Establish reasonable short term and long term weight goals.;Obesity: Provide education and appropriate resources to help participant work on and attain dietary goals.;Weight Management: Develop a combined nutrition and exercise program designed to reach desired caloric intake, while maintaining appropriate intake of nutrient and fiber, sodium and fats, and appropriate energy  expenditure required for the weight goal.    Admit Weight 308 lb 12.8 oz (140.1 kg)    Goal Weight: Short Term 231 lb 12.8 oz (105.1 kg)    Goal Weight: Long Term 154 lb 6.4 oz (70 kg)    Expected Outcomes Short Term: Continue to assess and modify interventions until short term weight is achieved;Long Term: Adherence to nutrition and physical activity/exercise program aimed toward attainment of established weight goal;Weight Loss: Understanding of general recommendations for a balanced deficit meal plan, which promotes 1-2 lb weight loss per week and includes a negative energy balance of [phone] kcal/d;Understanding recommendations for meals to include 15-35% energy as protein, 25-35% energy from fat, 35-60% energy from carbohydrates, less than 200mg  of dietary cholesterol, 20-35 gm of total fiber daily;Understanding of distribution of calorie intake throughout the day with the consumption of 4-5 meals/snacks    Improve shortness of breath with ADL's Yes    Intervention Provide education, individualized exercise plan and daily activity instruction to help decrease symptoms of SOB with activities of daily living.    Expected Outcomes Short Term: Improve cardiorespiratory fitness to achieve a reduction of symptoms when performing ADLs;Long Term: Be able to perform more ADLs without symptoms or delay the onset of symptoms    Hypertension Yes    Intervention Provide education on lifestyle modifcations including regular physical activity/exercise, weight management, moderate sodium restriction and increased consumption of fresh fruit, vegetables, and low  fat dairy, alcohol moderation, and smoking cessation.    Expected Outcomes Short Term: Continued assessment and intervention until BP is < 140/23mm HG in hypertensive participants. < 130/52mm HG in hypertensive participants with diabetes, heart failure or chronic kidney disease.;Long Term: Maintenance of blood pressure at goal levels.    Lipids Yes     Intervention Provide education and support for participant on nutrition & aerobic/resistive exercise along with prescribed medications to achieve LDL 70mg , HDL >40mg .    Expected Outcomes Short Term: Participant states understanding of desired cholesterol values and is compliant with medications prescribed. Participant is following exercise prescription and nutrition guidelines.;Long Term: Cholesterol controlled with medications as prescribed, with individualized exercise RX and with personalized nutrition plan. Value goals: LDL < 70mg , HDL > 40 mg.          Education:Diabetes - Individual verbal and written instruction to review signs/symptoms of diabetes, desired ranges of glucose level fasting, after meals and with exercise. Acknowledge that pre and post exercise glucose checks will be done for 3 sessions at entry of program.   Know Your Numbers and Heart Failure: - Group verbal and visual instruction to discuss disease risk factors for cardiac and pulmonary disease and treatment options.  Reviews associated critical values for Overweight/Obesity, Hypertension, Cholesterol, and Diabetes.  Discusses basics of heart failure: signs/symptoms and treatments.  Introduces Heart Failure Zone chart for action plan for heart failure. Written material provided at class time.   Core Components/Risk Factors/Patient Goals Review:   Goals and Risk Factor Review     Row Name 03/26/24 1404             Core Components/Risk Factors/Patient Goals Review   Personal Goals Review Improve shortness of breath with ADL's       Review Erin Contreras reports that she still has some shortness of breath during ADLs. Says has noticed some improvements since starting rehab.       Expected Outcomes STG: Continue to attend rehab and use PLB during exertion. LTG: Manage risk factors independently          Core Components/Risk Factors/Patient Goals at Discharge (Final Review):   Goals and Risk Factor Review - 03/26/24 1404        Core Components/Risk Factors/Patient Goals Review   Personal Goals Review Improve shortness of breath with ADL's    Review Erin Contreras reports that she still has some shortness of breath during ADLs. Says has noticed some improvements since starting rehab.    Expected Outcomes STG: Continue to attend rehab and use PLB during exertion. LTG: Manage risk factors independently          ITP Comments:  ITP Comments     Row Name 02/13/24 1402 02/16/24 1014 02/27/24 1412 02/29/24 0831 03/28/24 0911   ITP Comments Initial phone call completed. Diagnosis can be found in [facility] 01/20/2024. EP Orientation scheduled for February 16, 2024 @ 0800. Completed and gym orientation for respiratory care services. Initial ITP created and sent for review to Dr. Faud Aleskerov, Medical Director. First full day of exercise!  Patient was oriented to gym and equipment including functions, settings, policies, and procedures.  Patient's individual exercise prescription and treatment plan were reviewed.  All starting workloads were established based on the results of the 6 minute walk test done at initial orientation visit.  The plan for exercise progression was also introduced and progression will be customized based on patient's performance and goals. 30 Day review completed. Medical Director ITP review done, changes made as  directed, and signed approval by Medical Director. new to program. 30 Day review completed. Medical Director ITP review done; changes made as directed and signed approval by Medical Director.      Comments: 30 day review

## 2024-03-28 NOTE — Telephone Encounter (Signed)
 I spoke with the patient.  [employer] Medicare/medicaid no auth req   She is scheduled at [facility] for 04/23/24 at 9 pm.  Mailed packet to the patient.

## 2024-04-04 ENCOUNTER — Encounter: Attending: Pulmonary Disease

## 2024-04-04 DIAGNOSIS — J849 Interstitial pulmonary disease, unspecified: Secondary | ICD-10-CM | POA: Insufficient documentation

## 2024-04-04 DIAGNOSIS — R0602 Shortness of breath: Secondary | ICD-10-CM | POA: Insufficient documentation

## 2024-04-09 ENCOUNTER — Encounter: Admitting: Emergency Medicine

## 2024-04-09 DIAGNOSIS — J849 Interstitial pulmonary disease, unspecified: Secondary | ICD-10-CM | POA: Diagnosis present

## 2024-04-09 DIAGNOSIS — R0602 Shortness of breath: Secondary | ICD-10-CM

## 2024-04-09 NOTE — Progress Notes (Signed)
 Daily Session Note  Patient Details  Name: Erin Contreras MRN: [id number] Date of Birth: 1961-03-21 Referring Provider:   Flowsheet Row Pulmonary Rehab from 02/16/2024 in [facility] Cardiac and Pulmonary Rehab  Referring Provider Brena Reusing, MD    Encounter Date: 04/09/2024  Check In:  Session Check In - 04/09/24 1410       Check-In   Supervising physician immediately available to respond to emergencies See telemetry face sheet for immediately available ER MD    Location [facility]-Cardiac & Pulmonary Rehab    Staff Present Rollene Paterson, MS, [name];[name], BS, [name];Maxon Conetta BS, [name];Vy Badley RN,[name]    Virtual Visit No    Medication changes reported     No    Fall or balance concerns reported    No    Warm-up and Cool-down Performed on first and last piece of equipment    Resistance Training Performed Yes    VAD Patient? No    PAD/SET Patient? No      Pain Assessment   Currently in Pain? No/denies             Social History   Tobacco Use  Smoking Status Never  Smokeless Tobacco Never    Goals Met:  Independence with exercise equipment Exercise tolerated well No report of concerns or symptoms today Strength training completed today  Goals Unmet:  Not Applicable  Comments: Pt able to follow exercise prescription today without complaint.  Will continue to monitor for progression.    Dr. Oneil Pinal is Medical Director for [facility] Cardiac Rehabilitation.  Dr. Fuad Aleskerov is Medical Director for [facility] Pulmonary Rehabilitation.

## 2024-04-11 ENCOUNTER — Encounter

## 2024-04-11 DIAGNOSIS — R0602 Shortness of breath: Secondary | ICD-10-CM

## 2024-04-11 DIAGNOSIS — J849 Interstitial pulmonary disease, unspecified: Secondary | ICD-10-CM

## 2024-04-11 NOTE — Progress Notes (Signed)
 Daily Session Note  Patient Details  Name: Erin Contreras MRN: [id number] Date of Birth: 11/25/1960 Referring Provider:   Flowsheet Row Pulmonary Rehab from 02/16/2024 in [facility] Cardiac and Pulmonary Rehab  Referring Provider Brena Reusing, MD    Encounter Date: 04/11/2024  Check In:  Session Check In - 04/11/24 1403       Check-In   Supervising physician immediately available to respond to emergencies See telemetry face sheet for immediately available ER MD    Location [facility]-Cardiac & Pulmonary Rehab    Staff Present Burnard Davenport RN,[name],MPA;Laura Cates RN,[name];Senaida Chilcote Dyane BS, [name], [name];[name], BS, [name]    Virtual Visit No    Medication changes reported     No    Fall or balance concerns reported    No    Warm-up and Cool-down Performed on first and last piece of equipment    Resistance Training Performed Yes    VAD Patient? No    PAD/SET Patient? No      Pain Assessment   Currently in Pain? No/denies             Social History   Tobacco Use  Smoking Status Never  Smokeless Tobacco Never    Goals Met:  Proper associated with RPD/PD & O2 Sat Independence with exercise equipment Using PLB without cueing & demonstrates good technique Exercise tolerated well No report of concerns or symptoms today Strength training completed today  Goals Unmet:  Not Applicable  Comments: Pt able to follow exercise prescription today without complaint.  Will continue to monitor for progression.    Dr. Oneil Pinal is Medical Director for [facility] Cardiac Rehabilitation.  Dr. Fuad Aleskerov is Medical Director for [facility] Pulmonary Rehabilitation.

## 2024-04-16 ENCOUNTER — Encounter: Admitting: Emergency Medicine

## 2024-04-16 DIAGNOSIS — R0602 Shortness of breath: Secondary | ICD-10-CM | POA: Diagnosis not present

## 2024-04-16 DIAGNOSIS — J849 Interstitial pulmonary disease, unspecified: Secondary | ICD-10-CM

## 2024-04-16 NOTE — Progress Notes (Signed)
 Daily Session Note  Patient Details  Name: Erin Contreras MRN: [id number] Date of Birth: May 30, 1961 Referring Provider:   Flowsheet Row Pulmonary Rehab from 02/16/2024 in [facility] Cardiac and Pulmonary Rehab  Referring Provider Brena Reusing, MD    Encounter Date: 04/16/2024  Check In:  Session Check In - 04/16/24 1344       Check-In   Supervising physician immediately available to respond to emergencies See telemetry face sheet for immediately available ER MD    Location [facility]-Cardiac & Pulmonary Rehab    Staff Present Rollene Paterson, MS, [name];[name], BS, [name];Verl Whitmore RN,[name];Meredith Tressa RN,[name]    Virtual Visit No    Medication changes reported     No    Fall or balance concerns reported    No    Warm-up and Cool-down Performed on first and last piece of equipment    Resistance Training Performed Yes    VAD Patient? No    PAD/SET Patient? No      Pain Assessment   Currently in Pain? No/denies             Social History   Tobacco Use  Smoking Status Never  Smokeless Tobacco Never    Goals Met:  Proper associated with RPD/PD & O2 Sat Independence with exercise equipment Using PLB without cueing & demonstrates good technique Exercise tolerated well No report of concerns or symptoms today Strength training completed today  Goals Unmet:  Not Applicable  Comments: Pt able to follow exercise prescription today without complaint.  Will continue to monitor for progression.    Dr. Oneil Pinal is Medical Director for [facility] Cardiac Rehabilitation.  Dr. Fuad Aleskerov is Medical Director for [facility] Pulmonary Rehabilitation.

## 2024-04-18 ENCOUNTER — Encounter

## 2024-04-18 DIAGNOSIS — J849 Interstitial pulmonary disease, unspecified: Secondary | ICD-10-CM

## 2024-04-18 DIAGNOSIS — R0602 Shortness of breath: Secondary | ICD-10-CM

## 2024-04-18 NOTE — Progress Notes (Signed)
 Daily Session Note  Patient Details  Name: Erin Contreras MRN: [id number] Date of Birth: 1961-07-21 Referring Provider:   Flowsheet Row Pulmonary Rehab from 02/16/2024 in [facility] Cardiac and Pulmonary Rehab  Referring Provider Brena Reusing, MD    Encounter Date: 04/18/2024  Check In:  Session Check In - 04/18/24 1457       Check-In   Supervising physician immediately available to respond to emergencies See telemetry face sheet for immediately available ER MD    Location [facility]-Cardiac & Pulmonary Rehab    Staff Present Burnard Davenport RN,[name],MPA;[facility] Dyane BS, [name], [name];Laureen Delores, BS, RRT, CPFT    Virtual Visit No    Medication changes reported     No    Fall or balance concerns reported    No    Warm-up and Cool-down Performed on first and last piece of equipment    Resistance Training Performed Yes    VAD Patient? No    PAD/SET Patient? No      Pain Assessment   Currently in Pain? No/denies             Social History   Tobacco Use  Smoking Status Never  Smokeless Tobacco Never    Goals Met:  Proper associated with RPD/PD & O2 Sat Independence with exercise equipment Using PLB without cueing & demonstrates good technique Exercise tolerated well No report of concerns or symptoms today Strength training completed today  Goals Unmet:  Not Applicable  Comments: Pt able to follow exercise prescription today without complaint.  Will continue to monitor for progression.    Dr. Oneil Pinal is Medical Director for [facility] Cardiac Rehabilitation.  Dr. Fuad Aleskerov is Medical Director for [facility] Pulmonary Rehabilitation.

## 2024-04-23 ENCOUNTER — Ambulatory Visit: Admitting: Neurology

## 2024-04-23 ENCOUNTER — Encounter: Admitting: *Deleted

## 2024-04-23 DIAGNOSIS — R351 Nocturia: Secondary | ICD-10-CM

## 2024-04-23 DIAGNOSIS — Z820 Family history of epilepsy and other diseases of the nervous system: Secondary | ICD-10-CM

## 2024-04-23 DIAGNOSIS — G4733 Obstructive sleep apnea (adult) (pediatric): Secondary | ICD-10-CM

## 2024-04-23 DIAGNOSIS — G4719 Other hypersomnia: Secondary | ICD-10-CM

## 2024-04-23 DIAGNOSIS — Z9884 Bariatric surgery status: Secondary | ICD-10-CM

## 2024-04-23 DIAGNOSIS — G4761 Periodic limb movement disorder: Secondary | ICD-10-CM

## 2024-04-23 DIAGNOSIS — J849 Interstitial pulmonary disease, unspecified: Secondary | ICD-10-CM

## 2024-04-23 DIAGNOSIS — G4720 Circadian rhythm sleep disorder, unspecified type: Secondary | ICD-10-CM

## 2024-04-23 DIAGNOSIS — R519 Headache, unspecified: Secondary | ICD-10-CM

## 2024-04-23 DIAGNOSIS — R0602 Shortness of breath: Secondary | ICD-10-CM | POA: Diagnosis not present

## 2024-04-23 NOTE — Progress Notes (Signed)
 Daily Session Note  Patient Details  Name: Erin Contreras MRN: [id number] Date of Birth: 1960-09-21 Referring Provider:   Flowsheet Row Pulmonary Rehab from 02/16/2024 in [facility] Cardiac and Pulmonary Rehab  Referring Provider Brena Reusing, MD    Encounter Date: 04/23/2024  Check In:  Session Check In - 04/23/24 1400       Check-In   Supervising physician immediately available to respond to emergencies See telemetry face sheet for immediately available ER MD    Location [facility]-Cardiac & Pulmonary Rehab    Staff Present [name],[name];[name], BS, [name];[name], MS, [name];Maxon Conetta BS, [name]    Virtual Visit No    Medication changes reported     No    Fall or balance concerns reported    No    Warm-up and Cool-down Performed on first and last piece of equipment    Resistance Training Performed Yes    VAD Patient? No    PAD/SET Patient? No      Pain Assessment   Currently in Pain? No/denies             Social History   Tobacco Use  Smoking Status Never  Smokeless Tobacco Never    Goals Met:  Independence with exercise equipment Exercise tolerated well No report of concerns or symptoms today Strength training completed today  Goals Unmet:  Not Applicable  Comments: Pt able to follow exercise prescription today without complaint.  Will continue to monitor for progression.    Dr. Oneil Pinal is Medical Director for [facility] Cardiac Rehabilitation.  Dr. Fuad Aleskerov is Medical Director for [facility] Pulmonary Rehabilitation.

## 2024-04-25 ENCOUNTER — Encounter

## 2024-04-25 DIAGNOSIS — J849 Interstitial pulmonary disease, unspecified: Secondary | ICD-10-CM

## 2024-04-25 DIAGNOSIS — R0602 Shortness of breath: Secondary | ICD-10-CM

## 2024-04-25 NOTE — Progress Notes (Signed)
 Daily Session Note  Patient Details  Name: ROSSANNA SPITZLEY MRN: [id number] Date of Birth: November 23, 1960 Referring Provider:   Flowsheet Row Pulmonary Rehab from 02/16/2024 in [facility] Cardiac and Pulmonary Rehab  Referring Provider Brena Reusing, MD    Encounter Date: 04/25/2024  Check In:  Session Check In - 04/25/24 1401       Check-In   Supervising physician immediately available to respond to emergencies See telemetry face sheet for immediately available ER MD    Location [facility]-Cardiac & Pulmonary Rehab    Staff Present Burnard Davenport RN,[name],MPA;Laura Cates RN,[name];Maxon Burnell BS, [name];[name], BS, [name]    Virtual Visit No    Medication changes reported     No    Fall or balance concerns reported    No    Warm-up and Cool-down Performed on first and last piece of equipment    Resistance Training Performed Yes    VAD Patient? No    PAD/SET Patient? No      Pain Assessment   Currently in Pain? No/denies             Social History   Tobacco Use  Smoking Status Never  Smokeless Tobacco Never    Goals Met:  Proper associated with RPD/PD & O2 Sat Independence with exercise equipment Using PLB without cueing & demonstrates good technique Exercise tolerated well No report of concerns or symptoms today Strength training completed today  Goals Unmet:  Not Applicable  Comments: Pt able to follow exercise prescription today without complaint.  Will continue to monitor for progression.    Dr. Oneil Pinal is Medical Director for [facility] Cardiac Rehabilitation.  Dr. Fuad Aleskerov is Medical Director for [facility] Pulmonary Rehabilitation.

## 2024-04-25 NOTE — Progress Notes (Signed)
 Pulmonary Individual Treatment Plan  Patient Details  Name: Erin Contreras MRN: [id number] Date of Birth: 09-Dec-1960 Referring Provider:   Flowsheet Row Pulmonary Rehab from 02/16/2024 in [facility] Cardiac and Pulmonary Rehab  Referring Provider Brena Reusing, MD    Initial Encounter Date:  Flowsheet Row Pulmonary Rehab from 02/16/2024 in [facility] Cardiac and Pulmonary Rehab  Date 02/16/24    Visit Diagnosis: Shortness of breath  Interstitial lung disease (HCC)  Patient's Home Medications on Admission:  Current Outpatient Medications:    acyclovir  (ZOVIRAX ) 400 MG tablet, Take 1 tablet (400 mg total) by mouth 2 (two) times daily as needed., Disp: 180 tablet, Rfl: 4   Cholecalciferol  1.25 MG (50000 UT) TABS, Take 1 tablet by mouth once a week., Disp: 12 tablet, Rfl: 4   conjugated estrogens  (PREMARIN ) vaginal cream, Place 0.25 Applicatorfuls vaginally at bedtime for 14 days, THEN 0.25 Applicatorfuls every other day for 14 days, THEN 0.25 Applicatorfuls 2 (two) times a week., Disp: 42.5 g, Rfl: 12   DULoxetine  (CYMBALTA ) 20 MG capsule, Take 2 capsules (40 mg total) by mouth every morning., Disp: 60 capsule, Rfl: 2   famotidine  (PEPCID ) 40 MG tablet, Take 1 tablet (40 mg total) by mouth at bedtime., Disp: 90 tablet, Rfl: 3   folic acid  (FOLVITE ) 1 MG tablet, Take 1 mg by mouth daily., Disp: , Rfl:    hydroxychloroquine (PLAQUENIL) 200 MG tablet, Take 200 mg by mouth 2 (two) times daily., Disp: , Rfl:    meloxicam  (MOBIC ) 15 MG tablet, Take 15 mg by mouth daily., Disp: , Rfl:    methotrexate  (RHEUMATREX) 2.5 MG tablet, Take 20 mg by mouth once a week., Disp: , Rfl:    pantoprazole  (PROTONIX ) 40 MG tablet, Take 1 tablet (40 mg total) by mouth 2 (two) times daily., Disp: 180 tablet, Rfl: 3   pregabalin  (LYRICA ) 150 MG capsule, Take 1 capsule (150 mg total) by mouth at bedtime., Disp: 30 capsule, Rfl: 5   Tocilizumab (ACTEMRA ACTPEN) 162 MG/0.9ML SOAJ, Inject into the skin., Disp: , Rfl:     vitamin B-12 (CYANOCOBALAMIN ) 100 MCG tablet, Take 100 mcg by mouth daily., Disp: , Rfl:   Past Medical History: Past Medical History:  Diagnosis Date   Boils    Ear mass    GERD (gastroesophageal reflux disease)    Hemorrhoids    Herpes simplex virus infection    HSV infection    Lumbago with sciatica    OA (osteoarthritis) of knee    Obesity    Overactive bladder    PONV (postoperative nausea and vomiting)    Pre-diabetes    RA (rheumatoid arthritis) (HCC)    Sleep apnea    does not use cpap-had bariatric surgery and has no issues since surgery   Urge and stress incontinence     Tobacco Use: Social History   Tobacco Use  Smoking Status Never  Smokeless Tobacco Never    Labs: Review Flowsheet  More data exists      Latest Ref Rng & Units 04/09/2022 10/08/2022 01/25/2023 07/28/2023 12/06/2023  Labs for ITP Cardiac and Pulmonary Rehab  Cholestrol 100 - 199 mg/dL 757  792  815  813  771   LDL (calc) 0 - 99 mg/dL 859  883  898  891  857   HDL-C >39 mg/dL 78  65  61  60  62   Trlycerides 0 - 149 mg/dL 859  848  874  899  863   Hemoglobin A1c 4.8 - 5.6 %  6.2  6.2  6.3  6.1  5.8      Pulmonary Assessment Scores:  Pulmonary Assessment Scores     Row Name 02/16/24 1025         ADL UCSD   ADL Phase Entry     SOB Score total 62     Rest 3     Walk 2     Stairs 2     Bath 2     Dress 2     Shop 3       CAT Score   CAT Score 11       mMRC Score   mMRC Score 4        UCSD: Self-administered rating of dyspnea associated with activities of daily living (ADLs) 6-point scale (0 = not at all to 5 = maximal or unable to do because of breathlessness)  Scoring Scores range from 0 to 120.  Minimally important difference is 5 units  CAT: CAT can identify the health impairment of COPD patients and is better correlated with disease progression.  CAT has a scoring range of zero to 40. The CAT score is classified into four groups of low (less than 10), medium (10 - 20),  high (21-30) and very high (31-40) based on the impact level of disease on health status. A CAT score over 10 suggests significant symptoms.  A worsening CAT score could be explained by an exacerbation, poor medication adherence, poor inhaler technique, or progression of COPD or comorbid conditions.  CAT MCID is 2 points  mMRC: mMRC (Modified Medical Research Council) Dyspnea Scale is used to assess the degree of baseline functional disability in patients of respiratory disease due to dyspnea. No minimal important difference is established. A decrease in score of 1 point or greater is considered a positive change.   Pulmonary Function Assessment:   Exercise Target Goals: Exercise Program Goal: Individual exercise prescription set using results from initial 6 min walk test and THRR while considering  patient's activity barriers and safety.   Exercise Prescription Goal: Initial exercise prescription builds to 30-45 minutes a day of aerobic activity, 2-3 days per week.  Home exercise guidelines will be given to patient during program as part of exercise prescription that the participant will acknowledge.  Education: Aerobic Exercise: - Group verbal and visual presentation on the components of exercise prescription. Introduces F.I.T.T principle from ACSM for exercise prescriptions.  Reviews F.I.T.T. principles of aerobic exercise including progression. Written material provided at class time.   Education: Resistance Exercise: - Group verbal and visual presentation on the components of exercise prescription. Introduces F.I.T.T principle from ACSM for exercise prescriptions  Reviews F.I.T.T. principles of resistance exercise including progression. Written material provided at class time. Flowsheet Row Pulmonary Rehab from 04/18/2024 in [facility] Cardiac and Pulmonary Rehab  Date 02/29/24  Educator [facility]  Instruction Review Code 1- [employer] Understanding     Education: Exercise & Equipment Safety: -  Individual verbal instruction and demonstration of equipment use and safety with use of the equipment. Flowsheet Row Pulmonary Rehab from 02/16/2024 in [facility] Cardiac and Pulmonary Rehab  Date 02/16/24  Educator MB  Instruction Review Code 1- Verbalizes Understanding    Education: Exercise Physiology & General Exercise Guidelines: - Group verbal and written instruction with models to review the exercise physiology of the cardiovascular system and associated critical values. Provides general exercise guidelines with specific guidelines to those with heart or lung disease.  Flowsheet Row Pulmonary Rehab from 04/18/2024 in [facility] Cardiac  and Pulmonary Rehab  Education need identified 02/27/24    Education: Flexibility, Balance, Mind/Body Relaxation: - Group verbal and visual presentation with interactive activity on the components of exercise prescription. Introduces F.I.T.T principle from ACSM for exercise prescriptions. Reviews F.I.T.T. principles of flexibility and balance exercise training including progression. Also discusses the mind body connection.  Reviews various relaxation techniques to help reduce and manage stress (i.e. Deep breathing, progressive muscle relaxation, and visualization). Balance handout provided to take home. Written material provided at class time. Flowsheet Row Pulmonary Rehab from 04/18/2024 in [facility] Cardiac and Pulmonary Rehab  Date 02/29/24  Educator [facility]  Instruction Review Code 1- Verbalizes Understanding    Activity Barriers & Risk Stratification:  Activity Barriers & Cardiac Risk Stratification - 02/16/24 1016       Activity Barriers & Cardiac Risk Stratification   Activity Barriers Arthritis;Joint Problems;Shortness of Breath;History of Falls;Left Knee Replacement;Right Knee Replacement          6 Minute Walk:  6 Minute Walk     Row Name 02/16/24 1015         6 Minute Walk   Phase Initial     Distance 1100 feet     Walk Time 6 minutes     # of Rest  Breaks 0     MPH 2.08     METS 2.34     RPE 11     Perceived Dyspnea  0     VO2 Peak 8.19     Symptoms No     Resting HR 78 bpm     Resting BP 126/80     Resting Oxygen Saturation  93 %     Exercise Oxygen Saturation  during 6 min walk 90 %     Max Ex. HR 125 bpm     Max Ex. BP 160/80     2 Minute Post BP 118/80       Interval HR   1 Minute HR 90     2 Minute HR 106     3 Minute HR 125     4 Minute HR 108     5 Minute HR 101     6 Minute HR 107     2 Minute Post HR 88     Interval Heart Rate? Yes       Interval Oxygen   Interval Oxygen? Yes     Baseline Oxygen Saturation % 93 %     1 Minute Oxygen Saturation % 93 %     1 Minute Liters of Oxygen 0 L     2 Minute Oxygen Saturation % 91 %     2 Minute Liters of Oxygen 0 L     3 Minute Oxygen Saturation % 91 %     3 Minute Liters of Oxygen 0 L     4 Minute Oxygen Saturation % 91 %     4 Minute Liters of Oxygen 0 L     5 Minute Oxygen Saturation % 90 %     5 Minute Liters of Oxygen 0 L     6 Minute Oxygen Saturation % 90 %     6 Minute Liters of Oxygen 0 L     2 Minute Post Oxygen Saturation % 94 %     2 Minute Post Liters of Oxygen 0 L       Oxygen Initial Assessment:  Oxygen Initial Assessment - 02/13/24 1346       Home Oxygen  Home Oxygen Device None    Sleep Oxygen Prescription CPAP   not using,has an appt scheduled to be fitted for new CPAP   Home Exercise Oxygen Prescription None    Home Resting Oxygen Prescription None      Intervention   Short Term Goals To learn and exhibit compliance with exercise, home and travel O2 prescription;To learn and understand importance of monitoring SPO2 with pulse oximeter and demonstrate accurate use of the pulse oximeter.;To learn and understand importance of maintaining oxygen saturations>88%;To learn and demonstrate proper pursed lip breathing techniques or other breathing techniques. ;To learn and demonstrate proper use of respiratory medications    Long  Term Goals  Exhibits compliance with exercise, home  and travel O2 prescription;Verbalizes importance of monitoring SPO2 with pulse oximeter and return demonstration;Maintenance of O2 saturations>88%;Exhibits proper breathing techniques, such as pursed lip breathing or other method taught during program session;Compliance with respiratory medication          Oxygen Re-Evaluation:  Oxygen Re-Evaluation     Row Name 02/27/24 1414 03/26/24 1359 04/23/24 1552         Home Oxygen   Home Oxygen Device -- None None     Sleep Oxygen Prescription -- CPAP CPAP     Home Exercise Oxygen Prescription -- None None     Home Resting Oxygen Prescription -- None None       Goals/Expected Outcomes   Short Term Goals -- To learn and demonstrate proper pursed lip breathing techniques or other breathing techniques.  To learn and demonstrate proper pursed lip breathing techniques or other breathing techniques.      Long  Term Goals -- Exhibits proper breathing techniques, such as pursed lip breathing or other method taught during program session Exhibits proper breathing techniques, such as pursed lip breathing or other method taught during program session     Comments Reviewed PLB technique with pt.  Talked about how it works and it's importance in maintaining their exercise saturations. Reviewed PLB technique with pt. Talked about how it works and it's importance in maintaining their exercise saturations. Tareka reports that she does occasionally experience SOB at home with different activities. We reviewed PLB technique with pt. Talked about how it works and it's importance in maintaining her oxygen saturations and improving SOB.     Goals/Expected Outcomes Short: Become more profiecient at using PLB.   Long: Become independent at using PLB. Short: Become more profiecient at using PLB. Long: Become independent at using PLB. Short: Become more profiecient at using PLB. Long: Become independent at using PLB.        Oxygen  Discharge (Final Oxygen Re-Evaluation):  Oxygen Re-Evaluation - 04/23/24 1552       Home Oxygen   Home Oxygen Device None    Sleep Oxygen Prescription CPAP    Home Exercise Oxygen Prescription None    Home Resting Oxygen Prescription None      Goals/Expected Outcomes   Short Term Goals To learn and demonstrate proper pursed lip breathing techniques or other breathing techniques.     Long  Term Goals Exhibits proper breathing techniques, such as pursed lip breathing or other method taught during program session    Comments Paisli reports that she does occasionally experience SOB at home with different activities. We reviewed PLB technique with pt. Talked about how it works and it's importance in maintaining her oxygen saturations and improving SOB.    Goals/Expected Outcomes Short: Become more profiecient at using PLB. Long: Become independent  at using PLB.          Initial Exercise Prescription:  Initial Exercise Prescription - 02/16/24 1000       Date of Initial Exercise RX and Referring Provider   Date 02/16/24    Referring Provider Brena Reusing, MD      Oxygen   Maintain Oxygen Saturation 88% or higher      Treadmill   MPH 1.8   Try   Grade 0    Minutes 15    METs 2.38      Recumbant Bike   Level 2    RPM 50    Watts 25    Minutes 15    METs 2.34      REL-XR   Level 1    Speed 50    Minutes 15    METs 2.34      T5 Nustep   Level 2    SPM 80    Minutes 15    METs 2.34      Track   Laps 25    Minutes 15    METs 2.36      Prescription Details   Frequency (times per week) 2    Duration Progress to 30 minutes of continuous aerobic without signs/symptoms of physical distress      Intensity   THRR 40-80% of Max Heartrate 109-140    Ratings of Perceived Exertion 11-13    Perceived Dyspnea 0-4      Progression   Progression Continue to progress workloads to maintain intensity without signs/symptoms of physical distress.      Resistance  Training   Training Prescription Yes    Weight 4 lb    Reps 10-15          Perform Capillary Blood Glucose checks as needed.  Exercise Prescription Changes:   Exercise Prescription Changes     Row Name 02/16/24 1000 03/12/24 1700 03/29/24 1500 04/12/24 1500 04/16/24 1400     Response to Exercise   Blood Pressure (Admit) 126/80 122/70 122/86 138/82 --   Blood Pressure (Exercise) 160/80 170/90 146/84 198/90 --   Blood Pressure (Exit) 118/80 124/68 126/74 114/76 --   Heart Rate (Admit) 78 bpm 88 bpm 96 bpm 96 bpm --   Heart Rate (Exercise) 125 bpm 121 bpm 126 bpm 119 bpm --   Heart Rate (Exit) 81 bpm 87 bpm 90 bpm 105 bpm --   Oxygen Saturation (Admit) 93 % 94 % 93 % 95 % --   Oxygen Saturation (Exercise) 90 % 90 % 89 % 88 % --   Oxygen Saturation (Exit) 94 % 94 % 90 % 92 % --   Rating of Perceived Exertion (Exercise) 11 13 15 13  --   Perceived Dyspnea (Exercise) 0 0 0 0 --   Symptoms none none none none --   Comments results 1st 2 weeks of exercise sessions -- -- --   Duration -- Progress to 30 minutes of  aerobic without signs/symptoms of physical distress Continue with 30 min of aerobic exercise without signs/symptoms of physical distress. Continue with 30 min of aerobic exercise without signs/symptoms of physical distress. --   Intensity THRR New THRR unchanged THRR unchanged THRR unchanged --     Progression   Progression -- Continue to progress workloads to maintain intensity without signs/symptoms of physical distress. Continue to progress workloads to maintain intensity without signs/symptoms of physical distress. Continue to progress workloads to maintain intensity without signs/symptoms of physical distress. --  Average METs 2.34 2.26 2.59 2.53 --     Resistance Training   Training Prescription -- Yes Yes Yes --   Weight -- 4 lb 4 lb 4 lb --   Reps -- 10-15 10-15 10-15 --     Interval Training   Interval Training -- No No No --     Treadmill   MPH -- 1.8  1.8 1.6 --   Grade -- 0 0 0 --   Minutes -- 15 15 15  --   METs -- 2.38 2.38 2.23 --     Recumbant Bike   Level -- 3 -- -- --   Watts -- 15 -- -- --   Minutes -- 15 -- -- --   METs -- 2.34 -- -- --     REL-XR   Level -- 2 2 1  --   Minutes -- 15 15 15  --   METs -- 2.9 3.6 3.3 --     T5 Nustep   Level -- 2 2 2  --   Minutes -- 15 15 15  --   METs -- 1.8 1.8 1.8 --     Track   Laps -- -- 32 -- --   Minutes -- -- 15 -- --   METs -- -- 2.74 -- --     Home Exercise Plan   Plans to continue exercise at -- -- -- -- [employer] (comment)  [employer] for [employer], aerobic exercise, and other group fitness classes, walking and biking at home, and sit down exercises   Frequency -- -- -- -- Add 2 additional days to program exercise sessions.   Initial Home Exercises Provided -- -- -- -- 04/16/24     Oxygen   Maintain Oxygen Saturation -- 88% or higher 88% or higher 88% or higher --      Exercise Comments:   Exercise Comments     Row Name 02/27/24 1412           Exercise Comments First full day of exercise!  Patient was oriented to gym and equipment including functions, settings, policies, and procedures.  Patient's individual exercise prescription and treatment plan were reviewed.  All starting workloads were established based on the results of the 6 minute walk test done at initial orientation visit.  The plan for exercise progression was also introduced and progression will be customized based on patient's performance and goals.          Exercise Goals and Review:   Exercise Goals     Row Name 02/16/24 1020             Exercise Goals   Increase Physical Activity Yes       Intervention Provide advice, education, support and counseling about physical activity/exercise needs.;Develop an individualized exercise prescription for aerobic and resistive training based on initial evaluation findings, risk stratification, comorbidities and participant's personal  goals.       Expected Outcomes Short Term: Attend rehab on a regular basis to increase amount of physical activity.;Long Term: Add in home exercise to make exercise part of routine and to increase amount of physical activity.;Long Term: Exercising regularly at least 3-5 days a week.       Increase Strength and Stamina Yes       Intervention Provide advice, education, support and counseling about physical activity/exercise needs.;Develop an individualized exercise prescription for aerobic and resistive training based on initial evaluation findings, risk stratification, comorbidities and participant's personal goals.       Expected  Outcomes Short Term: Increase workloads from initial exercise prescription for resistance, speed, and METs.;Short Term: Perform resistance training exercises routinely during rehab and add in resistance training at home;Long Term: Improve cardiorespiratory fitness, muscular endurance and strength as measured by increased METs and functional capacity ( )       Able to understand and use rate of perceived exertion (RPE) scale Yes       Intervention Provide education and explanation on how to use RPE scale       Expected Outcomes Short Term: Able to use RPE daily in rehab to express subjective intensity level;Long Term:  Able to use RPE to guide intensity level when exercising independently       Able to understand and use Dyspnea scale Yes       Intervention Provide education and explanation on how to use Dyspnea scale       Expected Outcomes Short Term: Able to use Dyspnea scale daily in rehab to express subjective sense of shortness of breath during exertion;Long Term: Able to use Dyspnea scale to guide intensity level when exercising independently       Knowledge and understanding of Target Heart Rate Range (THRR) Yes       Intervention Provide education and explanation of THRR including how the numbers were predicted and where they are located for reference       Expected  Outcomes Short Term: Able to state/look up THRR;Short Term: Able to use daily as guideline for intensity in rehab;Long Term: Able to use THRR to govern intensity when exercising independently       Able to check pulse independently Yes       Intervention Provide education and demonstration on how to check pulse in carotid and radial arteries.;Review the importance of being able to check your own pulse for safety during independent exercise       Expected Outcomes Short Term: Able to explain why pulse checking is important during independent exercise;Long Term: Able to check pulse independently and accurately       Understanding of Exercise Prescription Yes       Intervention Provide education, explanation, and written materials on patient's individual exercise prescription       Expected Outcomes Short Term: Able to explain program exercise prescription;Long Term: Able to explain home exercise prescription to exercise independently          Exercise Goals Re-Evaluation :  Exercise Goals Re-Evaluation     Row Name 02/27/24 1413 03/12/24 1712 03/26/24 1357 03/29/24 1542 04/12/24 1514     Exercise Goal Re-Evaluation   Exercise Goals Review Increase Physical Activity;Knowledge and understanding of Target Heart Rate Range (THRR);Able to understand and use rate of perceived exertion (RPE) scale;Understanding of Exercise Prescription;Increase Strength and Stamina;Able to check pulse independently Increase Physical Activity;Understanding of Exercise Prescription;Increase Strength and Stamina -- Increase Physical Activity;Understanding of Exercise Prescription;Increase Strength and Stamina Increase Physical Activity;Understanding of Exercise Prescription;Increase Strength and Stamina   Comments Reviewed RPE and dyspnea scale, THR and program prescription with pt today.  Pt voiced understanding and was given a copy of goals to take home. Renell is off to a good start in the program and completed her first 2  weeks of exercise sessions in this review. She had workload on the treadmill of a speed of 1.8 mph with no incline. She worked at level 2 on the T5 nustep and XR. She worked at level 3 on the recumbent bike. We will continue to monitor her progress in the  program. Lorinda is doing well here at rehab. She is doing line dancing 2 days per week on top of rehab exercise. Encouraged her to contineu to exercise at home, and focus on increasing workload here at rehab. Nivia is doing well in rehab. She has continued to work at level 2 on both the XR and T5 nustep machines. She also increased her walking distance to 32 laps on the track. We will continue to monitor her progress in the program. Loralyn is doing well in rehab. She maintained level 2 on the T5 nustep. She decreased her speed slightly to 1.6 mph on the treadmill and decreased to level 1 on the XR. We will continue to monitor her progress in the program.   Expected Outcomes Short: Use RPE daily to regulate intensity.  Long: Follow program prescription in THR. Short: Continue to follow current exercise prescription. Long: Continue exercise to improve strength and stamina. STG: Continue to follow current exercise prescription. LTG: Continue exercise to improve strength and stamina. Short: Begin to progressively increase workloads. Long: Continue exercise to improve strength and stamina. Short: Try to get back to level 2 on the XR and increase speed on the treadmill. Long: Continue exercise to improve strength and stamina.    Row Name 04/16/24 1443             Exercise Goal Re-Evaluation   Exercise Goals Review Increase Physical Activity;Able to understand and use rate of perceived exertion (RPE) scale;Knowledge and understanding of Target Heart Rate Range (THRR);Understanding of Exercise Prescription;Increase Strength and Stamina;Able to understand and use Dyspnea scale;Able to check pulse independently       Comments Reviewed home exercise with pt today  from 2:09pm to 2:19pm.  Pt plans to go to [employer] for line dancing, aerobic exercise, and other group fitness classes, and walk, bike, and do sit down exercises at home for exercise. She plans to add 2 addtional days of exercise at home.  Reviewed THR, pulse, RPE, sign and symptoms, pulse oximetery and when to call 911 or MD.  Also discussed weather considerations and indoor options.  Pt voiced understanding.       Expected Outcomes Short : Add 2 additional days of exercise at home. Long: Continue to exercise at home independently.          Discharge Exercise Prescription (Final Exercise Prescription Changes):  Exercise Prescription Changes - 04/16/24 1400       Home Exercise Plan   Plans to continue exercise at [facility] (comment)   [facility] for [employer], aerobic exercise, and other group fitness classes, walking and biking at home, and sit down exercises   Frequency Add 2 additional days to program exercise sessions.    Initial Home Exercises Provided 04/16/24          Nutrition:  Target Goals: Understanding of nutrition guidelines, daily intake of sodium 1500mg , cholesterol 200mg , calories 30% from fat and 7% or less from saturated fats, daily to have 5 or more servings of fruits and vegetables.  Education: Nutrition 1 -Group instruction provided by verbal, written material, interactive activities, discussions, models, and posters to present general guidelines for heart healthy nutrition including macronutrients, label reading, and promoting whole foods over processed counterparts. Education serves as Pensions consultant of discussion of heart healthy eating for all. Written material provided at class time. Flowsheet Row Pulmonary Rehab from 04/18/2024 in [facility] Cardiac and Pulmonary Rehab  Date 03/14/24  Educator jg  Instruction Review Code 1- Verbalizes Understanding  Education: Nutrition 2 -Group instruction provided by verbal, written material,  interactive activities, discussions, models, and posters to present general guidelines for heart healthy nutrition including sodium, cholesterol, and saturated fat. Providing guidance of habit forming to improve blood pressure, cholesterol, and body weight. Written material provided at class time.     Biometrics:  Pre Biometrics - 02/16/24 1024       Pre Biometrics   Height 5' 9.5 (1.765 m)    Weight 308 lb 12.8 oz (140.1 kg)    Waist Circumference 53.9 inches    Hip Circumference 62 inches    Waist to Hip Ratio 0.87 %    BMI (Calculated) 44.96    Single Leg Stand 12.5 seconds           Nutrition Therapy Plan and Nutrition Goals:  Nutrition Therapy & Goals - 02/27/24 1534       Nutrition Therapy   Diet Cardiac, Low Na    Protein (specify units) 80g    Fiber 25 grams    Whole Grain Foods 3 servings    Saturated Fats 15 max. grams    Fruits and Vegetables 5 servings/day    Sodium 2 grams      Personal Nutrition Goals   Nutrition Goal Eat 3 times per day, small frequent meals or nutrient dense snacks    Personal Goal #2 Drink 48-64oz of non sugary beverages (not during meals)    Personal Goal #3 Get junk food out of house and build habits around not eating junk on daily basis.    Comments Keniyah has had bariatric surgery ~9years ago. She has gained the weight back. She reports she struggles with consistency. Says some weeks she will make good food choices, but then the next week she makes poor choices. She reports she likes junk food and eats it often at night. Spoke with her about if it was a sweet craving or habitual. She felt it was habitual. Recommended she not buy any junk food and cut out sugary beverages. She drinks ~32-48oz of water. Suggested she aim for most of her fluids be water and if she wants sometime different it should be a not caloric beverage. She wants to find a weight loss plan that is sustainable and healthy. Spoke with her about foods and what a nutrition  plan could look like. She is interested in making changes, RD will work on filling in knowledge gaps and checking in throughout rehab program to assess progress and consistency towards nutrition goals. Provided mediterranean diet handout, educated on types of fats, sources, and how to read labels. Brainstormed several protein carb paired meals and snacks with foods she likes and will eat.      Intervention Plan   Intervention Prescribe, educate and counsel regarding individualized specific dietary modifications aiming towards targeted core components such as weight, hypertension, lipid management, diabetes, heart failure and other comorbidities.;Nutrition handout(s) given to patient.    Expected Outcomes Long Term Goal: Adherence to prescribed nutrition plan.;Short Term Goal: A plan has been developed with personal nutrition goals set during dietitian appointment.;Short Term Goal: Understand basic principles of dietary content, such as calories, fat, sodium, cholesterol and nutrients.          Nutrition Assessments:  MEDIFICTS Score Key: >=70 Need to make dietary changes  40-70 Heart Healthy Diet <= 40 Therapeutic Level Cholesterol Diet  Flowsheet Row Pulmonary Rehab from 02/27/2024 in [facility] Cardiac and Pulmonary Rehab  Picture Your Plate Total Score on Admission 39  Picture Your Plate Scores: <59 Unhealthy dietary pattern with much room for improvement. 41-50 Dietary pattern unlikely to meet recommendations for good health and room for improvement. 51-60 More healthful dietary pattern, with some room for improvement.  >60 Healthy dietary pattern, although there may be some specific behaviors that could be improved.   Nutrition Goals Re-Evaluation:  Nutrition Goals Re-Evaluation     Row Name 03/26/24 1402 04/23/24 1546           Goals   Comment Alizay is doing better with nutrition. She is cutting back on sugar. Asked about several foods and how best to use them. Suggested a few  meal/snack ideas to help her stick with impactful and healthy food choices. Rosely reports her nutrition has been up and down as she does better with her diet on some days more than others. She has been working on cutting out sodas and limiting excess bread, as well as junk food. She is still eating out on weekends which she states has not been good for her nutrition. She has been drinking plenty of water.      Expected Outcome STG: cut back on excessive sugar. LTG: Follow a healthy diet that supports a healthy weight Short: Continue to limit junk food. Long: Continue to practice healthy eating patterns discussed with RD.         Nutrition Goals Discharge (Final Nutrition Goals Re-Evaluation):  Nutrition Goals Re-Evaluation - 04/23/24 1546       Goals   Comment Meliya reports her nutrition has been up and down as she does better with her diet on some days more than others. She has been working on cutting out sodas and limiting excess bread, as well as junk food. She is still eating out on weekends which she states has not been good for her nutrition. She has been drinking plenty of water.    Expected Outcome Short: Continue to limit junk food. Long: Continue to practice healthy eating patterns discussed with RD.          Psychosocial: Target Goals: Acknowledge presence or absence of significant depression and/or stress, maximize coping skills, provide positive support system. Participant is able to verbalize types and ability to use techniques and skills needed for reducing stress and depression.   Education: Stress, Anxiety, and Depression - Group verbal and visual presentation to define topics covered.  Reviews how body is impacted by stress, anxiety, and depression.  Also discusses healthy ways to reduce stress and to treat/manage anxiety and depression.  Written material provided at class time.   Education: Sleep Hygiene -Provides group verbal and written instruction about how sleep  can affect your health.  Define sleep hygiene, discuss sleep cycles and impact of sleep habits. Review good sleep hygiene tips.    Initial Review & Psychosocial Screening:  Initial Psych Review & Screening - 02/13/24 1350       Initial Review   Current issues with History of Depression;Current Stress Concerns    Source of Stress Concerns Chronic Illness    Comments --      Family Dynamics   Good Support System? Yes      Barriers   Psychosocial barriers to participate in program There are no identifiable barriers or psychosocial needs.;The patient should benefit from training in stress management and relaxation.      Screening Interventions   Interventions Encouraged to exercise    Expected Outcomes Short Term goal: Identification and review with participant of any Quality of Life or  Depression concerns found by scoring the questionnaire.;Long Term goal: The participant improves quality of Life and PHQ9 Scores as seen by post scores and/or verbalization of changes          Quality of Life Scores:  Scores of 19 and below usually indicate a poorer quality of life in these areas.  A difference of  2-3 points is a clinically meaningful difference.  A difference of 2-3 points in the total score of the Quality of Life Index has been associated with significant improvement in overall quality of life, self-image, physical symptoms, and general health in studies assessing change in quality of life.  PHQ-9: Review Flowsheet  More data exists      03/26/2024 02/16/2024 01/30/2024 12/06/2023 11/01/2023  Depression screen PHQ 2/9  Decreased Interest 1 1 0 0 1  Down, Depressed, Hopeless 0 0 0 0 0  PHQ - 2 Score 1 1 0 0 1  Altered sleeping 1 2 2 3  -  Tired, decreased energy 2 3 3 2  -  Change in appetite 0 0 0 0 -  Feeling bad or failure about yourself  0 0 0 0 -  Trouble concentrating 0 0 0 0 -  Moving slowly or fidgety/restless 0 0 1 0 -  Suicidal thoughts 0 0 0 0 -  PHQ-9 Score 4 6 6 5  -   Difficult doing work/chores Not difficult at all Not difficult at all Not difficult at all Not difficult at all -   Interpretation of Total Score  Total Score Depression Severity:  1-4 = Minimal depression, 5-9 = Mild depression, 10-14 = Moderate depression, 15-19 = Moderately severe depression, 20-27 = Severe depression   Psychosocial Evaluation and Intervention:  Psychosocial Evaluation - 02/13/24 1400       Psychosocial Evaluation & Interventions   Interventions Encouraged to exercise with the program and follow exercise prescription    Comments Patient stated that she has had occasional periods of depression, however does not take medication specifically for that. Although, patient is prescribed an anti-depressant that is also used to help manage chronic pain. Patient stated she does have some stress occasionally related to regular everyday life occurrences, but nothing specific and not all of the time. Patient stated she has a good support system of family and friends. Patient is interested in improving her shortness of breath and learning ways to improve her health and weight through lifestyle changes such as diet and exercise.    Expected Outcomes Short Term: Attend pulmonary rehab for exercise and education. Long Term: Develop and maintain positive healthcare habits.    Continue Psychosocial Services  Follow up required by staff          Psychosocial Re-Evaluation:  Psychosocial Re-Evaluation     Row Name 03/26/24 1400 04/23/24 1542           Psychosocial Re-Evaluation   Current issues with Current Sleep Concerns Current Sleep Concerns;Current Stress Concerns      Comments Dung reports her sleep has been improving, currently getting ~5-6hr of sleep per night. She has been focusing on sleeping, going to bed earlier [place] reports no major stressors at this time. She also reports that exercise has been a good stress reliever for her. She also states that she is still  struggling with sleep, as she is tired during the day bit cannot sleep at night. She is going for a sleep study tonight to gain information to help her improve her sleep quality.  Expected Outcomes STG: continue to attend rehab and focus on good sleep. LTG: Achieve and maintain positive outlook on health and daily life Short: Continue to exercise for stress relief. Long: Maintain positive outlook.      Interventions Encouraged to attend Pulmonary Rehabilitation for the exercise Encouraged to attend Pulmonary Rehabilitation for the exercise      Continue Psychosocial Services  Follow up required by staff Follow up required by staff         Psychosocial Discharge (Final Psychosocial Re-Evaluation):  Psychosocial Re-Evaluation - 04/23/24 1542       Psychosocial Re-Evaluation   Current issues with Current Sleep Concerns;Current Stress Concerns    Comments Ashleyanne reports no major stressors at this time. She also reports that exercise has been a good stress reliever for her. She also states that she is still struggling with sleep, as she is tired during the day bit cannot sleep at night. She is going for a sleep study tonight to gain information to help her improve her sleep quality.    Expected Outcomes Short: Continue to exercise for stress relief. Long: Maintain positive outlook.    Interventions Encouraged to attend Pulmonary Rehabilitation for the exercise    Continue Psychosocial Services  Follow up required by staff          Education: Education Goals: Education classes will be provided on a weekly basis, covering required topics. Participant will state understanding/return demonstration of topics presented.  Learning Barriers/Preferences:  Learning Barriers/Preferences - 02/13/24 1349       Learning Barriers/Preferences   Learning Barriers None    Learning Preferences Video;Verbal Instruction;Individual Instruction;Group Instruction;Computer/Internet;Audio;Written Material           General Pulmonary Education Topics:  Infection Prevention: - Provides verbal and written material to individual with discussion of infection control including proper hand washing and proper equipment cleaning during exercise session. Flowsheet Row Pulmonary Rehab from 02/16/2024 in [facility] Cardiac and Pulmonary Rehab  Date 02/16/24  Educator MB  Instruction Review Code 1- Verbalizes Understanding    Falls Prevention: - Provides verbal and written material to individual with discussion of falls prevention and safety. Flowsheet Row Pulmonary Rehab from 02/16/2024 in [facility] Cardiac and Pulmonary Rehab  Date 02/16/24  Educator MB  Instruction Review Code 1- Verbalizes Understanding    Chronic Lung Disease Review: - Group verbal instruction with posters, models, PowerPoint presentations and videos,  to review new updates, new respiratory medications, new advancements in procedures and treatments. Providing information on websites and 800 numbers for continued self-education. Includes information about supplement oxygen, available portable oxygen systems, continuous and intermittent flow rates, oxygen safety, concentrators, and Medicare reimbursement for oxygen. Explanation of Pulmonary Drugs, including class, frequency, complications, importance of spacers, rinsing mouth after steroid MDI's, and proper cleaning methods for nebulizers. Review of basic lung anatomy and physiology related to function, structure, and complications of lung disease. Review of risk factors. Discussion about methods for diagnosing sleep apnea and types of masks and machines for OSA. Includes a review of the use of types of environmental controls: home humidity, furnaces, filters, dust mite/pet prevention, HEPA vacuums. Discussion about weather changes, air quality and the benefits of nasal washing. Instruction on Warning signs, infection symptoms, calling MD promptly, preventive modes, and value of vaccinations. Review of  effective airway clearance, coughing and/or vibration techniques. Emphasizing that all should Create an Action Plan. Written material provided at class time. Flowsheet Row Pulmonary Rehab from 04/18/2024 in [facility] Cardiac and Pulmonary Rehab  Education need identified  02/27/24  Date 04/18/24  Educator jh  Instruction Review Code 1- Verbalizes Understanding    AED/CPR: - Group verbal and written instruction with the use of models to demonstrate the basic use of the AED with the basic ABC's of resuscitation.    Tests and Procedures:  - Group verbal and visual presentation and models provide information about basic cardiac anatomy and function. Reviews the testing methods done to diagnose heart disease and the outcomes of the test results. Describes the treatment choices: Medical Management, Angioplasty, or Coronary Bypass Surgery for treating various heart conditions including Myocardial Infarction, Angina, Valve Disease, and Cardiac Arrhythmias.  Written material provided at class time.   Medication Safety: - Group verbal and visual instruction to review commonly prescribed medications for heart and lung disease. Reviews the medication, class of the drug, and side effects. Includes the steps to properly store meds and maintain the prescription regimen.  Written material given at graduation.   Other: -Provides group and verbal instruction on various topics (see comments)   Knowledge Questionnaire Score:  Knowledge Questionnaire Score - 02/27/24 1701       Knowledge Questionnaire Score   Pre Score 14/18           Core Components/Risk Factors/Patient Goals at Admission:  Personal Goals and Risk Factors at Admission - 02/16/24 1026       Core Components/Risk Factors/Patient Goals on Admission    Weight Management Yes;Obesity;Weight Loss    Intervention Weight Management: Provide education and appropriate resources to help participant work on and attain dietary goals.;Weight  Management/Obesity: Establish reasonable short term and long term weight goals.;Obesity: Provide education and appropriate resources to help participant work on and attain dietary goals.;Weight Management: Develop a combined nutrition and exercise program designed to reach desired caloric intake, while maintaining appropriate intake of nutrient and fiber, sodium and fats, and appropriate energy expenditure required for the weight goal.    Admit Weight 308 lb 12.8 oz (140.1 kg)    Goal Weight: Short Term 231 lb 12.8 oz (105.1 kg)    Goal Weight: Long Term 154 lb 6.4 oz (70 kg)    Expected Outcomes Short Term: Continue to assess and modify interventions until short term weight is achieved;Long Term: Adherence to nutrition and physical activity/exercise program aimed toward attainment of established weight goal;Weight Loss: Understanding of general recommendations for a balanced deficit meal plan, which promotes 1-2 lb weight loss per week and includes a negative energy balance of [phone] kcal/d;Understanding recommendations for meals to include 15-35% energy as protein, 25-35% energy from fat, 35-60% energy from carbohydrates, less than 200mg  of dietary cholesterol, 20-35 gm of total fiber daily;Understanding of distribution of calorie intake throughout the day with the consumption of 4-5 meals/snacks    Improve shortness of breath with ADL's Yes    Intervention Provide education, individualized exercise plan and daily activity instruction to help decrease symptoms of SOB with activities of daily living.    Expected Outcomes Short Term: Improve cardiorespiratory fitness to achieve a reduction of symptoms when performing ADLs;Long Term: Be able to perform more ADLs without symptoms or delay the onset of symptoms    Hypertension Yes    Intervention Provide education on lifestyle modifcations including regular physical activity/exercise, weight management, moderate sodium restriction and increased consumption  of fresh fruit, vegetables, and low fat dairy, alcohol moderation, and smoking cessation.    Expected Outcomes Short Term: Continued assessment and intervention until BP is < 140/24mm HG in hypertensive participants. < 130/71mm HG in  hypertensive participants with diabetes, heart failure or chronic kidney disease.;Long Term: Maintenance of blood pressure at goal levels.    Lipids Yes    Intervention Provide education and support for participant on nutrition & aerobic/resistive exercise along with prescribed medications to achieve LDL 70mg , HDL >40mg .    Expected Outcomes Short Term: Participant states understanding of desired cholesterol values and is compliant with medications prescribed. Participant is following exercise prescription and nutrition guidelines.;Long Term: Cholesterol controlled with medications as prescribed, with individualized exercise RX and with personalized nutrition plan. Value goals: LDL < 70mg , HDL > 40 mg.          Education:Diabetes - Individual verbal and written instruction to review signs/symptoms of diabetes, desired ranges of glucose level fasting, after meals and with exercise. Acknowledge that pre and post exercise glucose checks will be done for 3 sessions at entry of program.   Know Your Numbers and Heart Failure: - Group verbal and visual instruction to discuss disease risk factors for cardiac and pulmonary disease and treatment options.  Reviews associated critical values for Overweight/Obesity, Hypertension, Cholesterol, and Diabetes.  Discusses basics of heart failure: signs/symptoms and treatments.  Introduces Heart Failure Zone chart for action plan for heart failure. Written material provided at class time.   Core Components/Risk Factors/Patient Goals Review:   Goals and Risk Factor Review     Row Name 03/26/24 1404 04/23/24 1549           Core Components/Risk Factors/Patient Goals Review   Personal Goals Review Improve shortness of breath with  ADL's Weight Management/Obesity;Hypertension      Review Tamorah reports that she still has some shortness of breath during ADLs. Says has noticed some improvements since starting rehab. Halle states that she would like to lose weight, with a long term weight goal of 160 lb. She has been working on weight loss through diet and exercise. She enjoys her Zumba dance class on days away from rehab. She stated that she does not own a personal BP cuff to monitor her BP cuff at home. She plans to get her daughter's help to obtain a personal BP cuff.      Expected Outcomes STG: Continue to attend rehab and use PLB during exertion. LTG: Manage risk factors independently Short: Continue to work on weight loss. Long: Continue to manage lifestyle risk factors.         Core Components/Risk Factors/Patient Goals at Discharge (Final Review):   Goals and Risk Factor Review - 04/23/24 1549       Core Components/Risk Factors/Patient Goals Review   Personal Goals Review Weight Management/Obesity;Hypertension    Review Tequia states that she would like to lose weight, with a long term weight goal of 160 lb. She has been working on weight loss through diet and exercise. She enjoys her Zumba dance class on days away from rehab. She stated that she does not own a personal BP cuff to monitor her BP cuff at home. She plans to get her daughter's help to obtain a personal BP cuff.    Expected Outcomes Short: Continue to work on weight loss. Long: Continue to manage lifestyle risk factors.          ITP Comments:  ITP Comments     Row Name 02/13/24 1402 02/16/24 1014 02/27/24 1412 02/29/24 0831 03/28/24 0911   ITP Comments Initial phone call completed. Diagnosis can be found in [facility] 01/20/2024. EP Orientation scheduled for February 16, 2024 @ 0800. Completed and gym orientation for  respiratory care services. Initial ITP created and sent for review to Dr. Faud Aleskerov, Medical Director. First full day of exercise!  Patient  was oriented to gym and equipment including functions, settings, policies, and procedures.  Patient's individual exercise prescription and treatment plan were reviewed.  All starting workloads were established based on the results of the 6 minute walk test done at initial orientation visit.  The plan for exercise progression was also introduced and progression will be customized based on patient's performance and goals. 30 Day review completed. Medical Director ITP review done, changes made as directed, and signed approval by Medical Director. new to program. 30 Day review completed. Medical Director ITP review done; changes made as directed and signed approval by Medical Director.    Row Name 04/25/24 1151           ITP Comments 30 Day review completed. Medical Director ITP review done; changes made as directed and signed approval by Medical Director.          Comments: 30 day review

## 2024-04-26 ENCOUNTER — Encounter: Payer: Self-pay | Admitting: Nurse Practitioner

## 2024-04-26 ENCOUNTER — Ambulatory Visit: Attending: Nurse Practitioner | Admitting: Nurse Practitioner

## 2024-04-26 VITALS — BP 133/89 | HR 82 | Temp 97.2°F | Ht 69.0 in | Wt 307.0 lb

## 2024-04-26 DIAGNOSIS — M461 Sacroiliitis, not elsewhere classified: Secondary | ICD-10-CM | POA: Insufficient documentation

## 2024-04-26 DIAGNOSIS — G5701 Lesion of sciatic nerve, right lower limb: Secondary | ICD-10-CM | POA: Diagnosis present

## 2024-04-26 DIAGNOSIS — Z79899 Other long term (current) drug therapy: Secondary | ICD-10-CM | POA: Insufficient documentation

## 2024-04-26 DIAGNOSIS — M533 Sacrococcygeal disorders, not elsewhere classified: Secondary | ICD-10-CM | POA: Insufficient documentation

## 2024-04-26 DIAGNOSIS — G5702 Lesion of sciatic nerve, left lower limb: Secondary | ICD-10-CM | POA: Insufficient documentation

## 2024-04-26 DIAGNOSIS — G894 Chronic pain syndrome: Secondary | ICD-10-CM | POA: Diagnosis present

## 2024-04-26 MED ORDER — PREGABALIN 150 MG PO CAPS: 150.0000 mg | ORAL_CAPSULE | Freq: Every day | ORAL | 5 refills | Status: AC

## 2024-04-26 MED ORDER — DULOXETINE HCL 20 MG PO CPEP: 40.0000 mg | ORAL_CAPSULE | Freq: Every morning | ORAL | 2 refills | Status: AC

## 2024-04-26 NOTE — Progress Notes (Signed)
 Safety precautions to be maintained throughout the outpatient stay will include: orient to surroundings, keep bed in low position, maintain call bell within reach at all times, provide assistance with transfer out of bed and ambulation.

## 2024-04-26 NOTE — Progress Notes (Signed)
 PROVIDER NOTE: Interpretation of information contained herein should be left to medically-trained personnel. Specific patient instructions are provided elsewhere under Patient Instructions section of medical record. This document was created in part using AI and STT-dictation technology, any transcriptional errors that may result from this process are unintentional.  Patient: Erin Contreras  Service: E/M   PCP: [name], NP  DOB: 1960-12-11  DOS: 04/26/2024  Provider: Emmy MARLA Blanch, NP  MRN: [id number]  Delivery: Face-to-face  Specialty: Interventional Pain Management  Type: Established Patient  Setting: Ambulatory outpatient facility  Specialty designation: 09  Referring Prov.: [name], NP  Location: Outpatient office facility       History of present illness (HPI) [name], a 63 y.o. year old female, is here today because of her SI joint arthritis [M46.1]. [name] primary complain today is Back Pain (Lower back worse on right)  Pertinent problems: [name] has OA (osteoarthritis) of knee; Obesity; Chronic radicular lumbar pain; Lumbar spondylosis; Lumbar degenerative disc disease; SI joint arthritis; and Chronic pain syndrome on their pertinent problem list.  Pain Assessment: Severity of Chronic pain is reported as a 6 /10. Location: Back Right, Lower/radiates down right leg to mid calf. Onset: More than a month ago. Quality: Aching. Timing: Intermittent. Modifying factor(s): sitting down, meds. Vitals:  height is 5' 9 (1.753 m) and weight is 307 lb (139.3 kg) (abnormal). Her temperature is 97.2 F (36.2 C) (abnormal). Her blood pressure is 133/89 and her pulse is 82. Her oxygen saturation is 97%.  BMI: Estimated body mass index is 45.34 kg/m as calculated from the following:   Height as of this encounter: 5' 9 (1.753 m).   Weight as of this encounter: 307 lb (139.3 kg).  Last encounter: 11/01/2023 Last procedure: 06/18/2021  Reason for encounter:  medication management. No change in medical history since last visit.  Patient's pain is at baseline.  Patient continues multimodal pain regimen as prescribed.  States that it provides pain relief and improvement in functional status. [name] present for her SI joint arthritis that worse more with walking. Patient's relief from pain and inflammation with SI joint injection and Piriformis muscle injection and improve mobility.  Pharmacotherapy Assessment   Pregabalin  (Lyrica ) 150 mg capsule daily at bedtime. Duloxetine  (Cymbalta ) 20 mg capsule every morning. Monitoring: [place] PMP: PDMP reviewed during this encounter.       Pharmacotherapy: No side-effects or adverse reactions reported. Compliance: No problems identified. Effectiveness: Clinically acceptable.  Erin Contreras, Toren, Erin Contreras  04/26/2024  8:53 AM  Sign when Signing Visit Safety precautions to be maintained throughout the outpatient stay will include: orient to surroundings, keep bed in low position, maintain call bell within reach at all times, provide assistance with transfer out of bed and ambulation.     UDS:  Summary  Date Value Ref Range Status  05/06/2020 Note  Final    Comment:    ==================================================================== Compliance Drug Analysis, Ur ==================================================================== Test                             Result       Flag       Units    NO DRUGS DETECTED. ==================================================================== Test                      Result    Flag   Units      Ref Range   Creatinine  187              mg/dL      >=79 ==================================================================== Declared Medications:  Medication list was not provided. ==================================================================== For clinical consultation, please call [phone]. ====================================================================     No results found for: CBDTHCR No results found for: D8THCCBX No results found for: D9THCCBX  ROS  Constitutional: Denies any fever or chills Gastrointestinal: No reported hemesis, hematochezia, vomiting, or acute GI distress Musculoskeletal: Low back pain (R>L) radiate down to right hip and leg Neurological: No reported episodes of acute onset apraxia, aphasia, dysarthria, agnosia, amnesia, paralysis, loss of coordination, or loss of consciousness  Medication Review  Cholecalciferol , DULoxetine , Tocilizumab, acyclovir , conjugated estrogens , famotidine , folic acid , hydroxychloroquine, meloxicam , methotrexate , pantoprazole , pregabalin , and vitamin B-12  History Review  Allergy: Erin Contreras has no known allergies. Drug: Erin Contreras  reports no history of drug use. Alcohol:  reports no history of alcohol use. Tobacco:  reports that she has never smoked. She has never used smokeless tobacco. Social: Erin Contreras  reports that she has never smoked. She has never used smokeless tobacco. She reports that she does not drink alcohol and does not use drugs. Medical:  has a past medical history of Boils, Ear mass, GERD (gastroesophageal reflux disease), Hemorrhoids, Herpes simplex virus infection, HSV infection, Lumbago with sciatica, OA (osteoarthritis) of knee, Obesity, Overactive bladder, PONV (postoperative nausea and vomiting), Pre-diabetes, RA (rheumatoid arthritis) (HCC), Sleep apnea, and Urge and stress incontinence. Surgical: Erin Contreras  has a past surgical history that includes Cholecystectomy (2011); Tumor removal (Right, 2010); Sleeve Gastroplasty; Fracture surgery (Right); Knee Arthroplasty (Right, 07/11/2017); Knee Arthroplasty (Left, 01/28/2021); Joint replacement (Left, 01/28/2021); Colonoscopy (N/A, 03/19/2021); and Esophagogastroduodenoscopy (egd) with propofol  (N/A, 02/07/2023). Family: family history  includes Cancer in her mother; Cirrhosis in her father; Diabetes in her mother, paternal grandfather, and paternal grandmother; Heart disease in her paternal grandfather and paternal grandmother; Hyperlipidemia in her mother; Hypertension in her daughter and mother; Stroke in her maternal grandfather and maternal grandmother.  Laboratory Chemistry Profile   Renal Lab Results  Component Value Date   BUN 14 12/06/2023   CREATININE 0.71 12/06/2023   BCR 20 12/06/2023   GFRAA 102 06/12/2020   GFRNONAA >60 01/05/2022    Hepatic Lab Results  Component Value Date   AST 17 12/06/2023   ALT 19 12/06/2023   ALBUMIN 3.9 12/06/2023   ALKPHOS 92 12/06/2023   LIPASE 106 05/10/2014    Electrolytes Lab Results  Component Value Date   NA 140 12/06/2023   K 4.1 12/06/2023   CL 104 12/06/2023   CALCIUM 9.3 12/06/2023   MG 1.9 12/06/2023   PHOS 3.7 09/04/2014    Bone Lab Results  Component Value Date   VD25OH 39.5 12/06/2023    Inflammation (CRP: Acute Phase) (ESR: Chronic Phase) Lab Results  Component Value Date   CRP 0.6 01/21/2021   ESRSEDRATE 43 (H) 01/21/2021         Note: Above Lab results reviewed.  Recent Imaging Review  MM 3D SCREEN BREAST BILATERAL CLINICAL DATA:  Screening.  EXAM: DIGITAL SCREENING BILATERAL MAMMOGRAM WITH TOMOSYNTHESIS AND CAD  TECHNIQUE: Bilateral screening digital craniocaudal and mediolateral oblique mammograms were obtained. Bilateral screening digital breast tomosynthesis was performed. The images were evaluated with computer-aided detection.  COMPARISON:  Previous exam(s).  ACR Breast Density Category b: There are scattered areas of fibroglandular density.  FINDINGS: There are no findings suspicious for malignancy.  IMPRESSION: No mammographic evidence of malignancy. A result letter  of this screening mammogram will be mailed directly to the patient.  RECOMMENDATION: Screening mammogram in one year. (Code:[id number]-B-01Y)  BI-RADS  CATEGORY  1: Negative.  Electronically Signed   By: Dobrinka  Dimitrova M.D.   On: 10/11/2022 16:57 Note: Reviewed        Physical Exam  Vitals: BP 133/89   Pulse 82   Temp (!) 97.2 F (36.2 C)   Ht 5' 9 (1.753 m)   Wt (!) 307 lb (139.3 kg)   SpO2 97%   BMI 45.34 kg/m  BMI: Estimated body mass index is 45.34 kg/m as calculated from the following:   Height as of this encounter: 5' 9 (1.753 m).   Weight as of this encounter: 307 lb (139.3 kg). Ideal: Ideal body weight: 66.2 kg (145 lb 15.1 oz) Adjusted ideal body weight: 95.4 kg (210 lb 5.9 oz) General appearance: Well nourished, well developed, and well hydrated. In no apparent acute distress Mental status: Alert, oriented x 3 (person, place, & time)       Respiratory: No evidence of acute respiratory distress Eyes: PERLA   Assessment   Diagnosis Status  1. SI joint arthritis   2. Piriformis syndrome of left side   3. Piriformis syndrome of right side   4. Sacroiliac joint pain   5. Chronic pain syndrome   6. Medication management    Controlled Controlled Controlled   Updated Problems: No problems updated.  Plan of Care  Problem-specific:  Assessment and Plan We will continue on Lyrica  150 mg capsules at that time and Cymbalta  20 mg (40 mg total) by mouth in the morning.  Prescribing drug monitoring (PDMP) reviewed findings consistent with the use of prescribed medication and no other evidence of narcotic misuse or abuse.  The patient reports increased sleepiness after taking Lyrica  at night, leading to daytime drowsiness.  I advised her to take the medication earlier in the evening to help minimize next-day drowsiness.  I also advised to continue watch the symptoms.  Schedule follow-up in 6 months for medication management with Keiran Gaffey, NP.   Erin Contreras has a current medication list which includes the following long-term medication(s): famotidine , pantoprazole , actemra actpen, duloxetine , and  pregabalin .  Pharmacotherapy (Medications Ordered): Meds ordered this encounter  Medications   pregabalin  (LYRICA ) 150 MG capsule    Sig: Take 1 capsule (150 mg total) by mouth at bedtime.    Dispense:  30 capsule    Refill:  5    Fill one day early if pharmacy is closed on scheduled refill date. May substitute for generic if available.   DULoxetine  (CYMBALTA ) 20 MG capsule    Sig: Take 2 capsules (40 mg total) by mouth every morning.    Dispense:  60 capsule    Refill:  2   Orders:  No orders of the defined types were placed in this encounter.       Return in about 6 months (around 10/24/2024) for (F2F), (MM), Emmy Blanch NP.    Recent Visits No visits were found meeting these conditions. Showing recent visits within past 90 days and meeting all other requirements Today's Visits Date Type Provider Dept  04/26/24 Office Visit Lamia Mariner K, NP [facility]  Showing today's visits and meeting all other requirements Future Appointments No visits were found meeting these conditions. Showing future appointments within next 90 days and meeting all other requirements  I discussed the assessment and treatment plan with the patient. The patient was provided an  opportunity to ask questions and all were answered. The patient agreed with the plan and demonstrated an understanding of the instructions.  Patient advised to call back or seek an in-person evaluation if the symptoms or condition worsens.   I personally spent a total of 30 minutes in the care of the patient today including preparing to see the patient, getting/reviewing separately obtained history, performing a medically appropriate exam/evaluation, counseling and educating, placing orders, referring and communicating with other health care professionals, documenting clinical information in the EHR, independently interpreting results, communicating results, and coordinating care.   Note by: Emmy MARLA Blanch, NP  Date:  04/26/2024; Time: 9:26 AM

## 2024-04-30 ENCOUNTER — Ambulatory Visit: Payer: Self-pay | Admitting: Neurology

## 2024-04-30 ENCOUNTER — Encounter: Admitting: Emergency Medicine

## 2024-04-30 DIAGNOSIS — J849 Interstitial pulmonary disease, unspecified: Secondary | ICD-10-CM

## 2024-04-30 DIAGNOSIS — R0602 Shortness of breath: Secondary | ICD-10-CM | POA: Diagnosis not present

## 2024-04-30 DIAGNOSIS — G4733 Obstructive sleep apnea (adult) (pediatric): Secondary | ICD-10-CM

## 2024-04-30 NOTE — Procedures (Signed)
 Physician Interpretation: Please see link under Procedure Tab or under Encounters tab for physician report, technical report, as well as O2 titration and/or PAP titration tables (if applicable).   Referred by: Dr. Modena Callander   History and Indication for Testing (obtained from visit note dated 02/02/2024): 63 year old female with an underlying complex medical history of stroke in May 2025, hypertension, hyperlipidemia, coronary artery disease, prior smoking, COPD, history of kidney stone, carotid artery disease with status post bilateral carotid endarterectomies, hypothyroidism, and overweight state, who reports snoring and nocturia. His Epworth sleepiness score is 3 out of 24, fatigue severity score is 15 out of 63.     Review of the EEG showed no abnormal electrical discharges and symmetrical bihemispheric findings.     EKG: The EKG revealed normal sinus rhythm (NSR). ***   AUDIO/VIDEO REVIEW: The audio and video review did not show any abnormal or unusual behaviors, movements, phonations or vocalizations. The patient took *** restroom breaks. Snoring was noted, ***   POST-STUDY QUESTIONNAIRE: Post study, the patient indicated, that sleep was *** the same as usual.    IMPRESSION:    Obstructive Sleep Apnea (OSA), *** ***Central Sleep Apnea (CSA) ***Primary Snoring ***Primary Central Sleep Apnea ***Complex Sleep Apnea ***PLMD (periodic limb movement disorder [of sleep]) ***Dysfunctions associated with sleep stages or arousal from sleep ***Non-specific abnormal electrocardiogram (EKG) ***Poor sleep pattern ***Inconclusive Test   RECOMMENDATIONS:         I certify that I have reviewed the entire raw data recording prior to the issuance of this report in accordance with the Standards of Accreditation of the American Academy of Sleep Medicine (AASM).   True Mar, MD, PhD Medical Director, [facility]) Diplomat, ABPN (Neurology and  Sleep)

## 2024-04-30 NOTE — Progress Notes (Signed)
 Daily Session Note  Patient Details  Name: Erin Contreras MRN: [id number] Date of Birth: 09/09/1960 Referring Provider:   Flowsheet Row Pulmonary Rehab from 02/16/2024 in [facility] Cardiac and Pulmonary Rehab  Referring Provider Brena Reusing, MD    Encounter Date: 04/30/2024  Check In:  Session Check In - 04/30/24 1352       Check-In   Supervising physician immediately available to respond to emergencies See telemetry face sheet for immediately available ER MD    Location [facility]-Cardiac & Pulmonary Rehab    Staff Present Rollene Paterson, MS, [name];[name], BS, [name];Maxon Conetta BS, [name];Gwynn Chalker RN,[name]    Virtual Visit No    Medication changes reported     No    Fall or balance concerns reported    No    Warm-up and Cool-down Performed on first and last piece of equipment    Resistance Training Performed Yes    VAD Patient? No    PAD/SET Patient? No      Pain Assessment   Currently in Pain? No/denies             Social History   Tobacco Use  Smoking Status Never  Smokeless Tobacco Never    Goals Met:  Proper associated with RPD/PD & O2 Sat Independence with exercise equipment Using PLB without cueing & demonstrates good technique Exercise tolerated well No report of concerns or symptoms today Strength training completed today  Goals Unmet:  Not Applicable  Comments: Pt able to follow exercise prescription today without complaint.  Will continue to monitor for progression.    Dr. Oneil Pinal is Medical Director for [facility] Cardiac Rehabilitation.  Dr. Fuad Aleskerov is Medical Director for [facility] Pulmonary Rehabilitation.

## 2024-05-02 ENCOUNTER — Encounter: Attending: Pulmonary Disease | Admitting: Emergency Medicine

## 2024-05-02 DIAGNOSIS — R0602 Shortness of breath: Secondary | ICD-10-CM | POA: Insufficient documentation

## 2024-05-02 DIAGNOSIS — J849 Interstitial pulmonary disease, unspecified: Secondary | ICD-10-CM | POA: Diagnosis present

## 2024-05-02 NOTE — Progress Notes (Signed)
 Daily Session Note  Patient Details  Name: Erin Contreras MRN: [id number] Date of Birth: Mar 13, 1961 Referring Provider:   Flowsheet Row Pulmonary Rehab from 02/16/2024 in [facility] Cardiac and Pulmonary Rehab  Referring Provider Brena Reusing, MD    Encounter Date: 05/02/2024  Check In:  Session Check In - 05/02/24 1357       Check-In   Supervising physician immediately available to respond to emergencies See telemetry face sheet for immediately available ER MD    Location [facility]-Cardiac & Pulmonary Rehab    Staff Present Devaughn Jaeger, BS, [name];[name], [name], [name];Elsie Sakuma RN,[name];Kelly Bollinger [facility]    Virtual Visit No    Medication changes reported     No    Fall or balance concerns reported    No    Warm-up and Cool-down Performed on first and last piece of equipment    Resistance Training Performed Yes    VAD Patient? No    PAD/SET Patient? No      Pain Assessment   Currently in Pain? No/denies             Social History   Tobacco Use  Smoking Status Never  Smokeless Tobacco Never    Goals Met:  Proper associated with RPD/PD & O2 Sat Independence with exercise equipment Using PLB without cueing & demonstrates good technique Exercise tolerated well No report of concerns or symptoms today Strength training completed today  Goals Unmet:  Not Applicable  Comments: Pt able to follow exercise prescription today without complaint.  Will continue to monitor for progression.    Dr. Oneil Pinal is Medical Director for [facility] Cardiac Rehabilitation.  Dr. Fuad Aleskerov is Medical Director for [facility] Pulmonary Rehabilitation.

## 2024-05-03 NOTE — Telephone Encounter (Signed)
 I called pt. I advised pt that Dr. Buck reviewed their sleep study results and found that pt had Moderate OSA. Dr. Buck recommends that pt start autopap. I reviewed PAP compliance expectations with the pt. Pt is agreeable to starting an auto-PAP. I advised pt that an order will be sent to a DME, [facility] in [place], [place], and they will call the pt within about one week after they file with the pt's insurance. They will show the pt how to use the machine, fit for masks, and troubleshoot the auto-PAP if needed. A follow up appt was made for insurance purposes with Amy Lomax NP on 08/13/2024 at 0900. Pt verbalized understanding to arrive 15 minutes early and bring their auto-PAP.  Pt verbalized understanding of results. Pt had no questions at this time but was encouraged to call back if questions arise. I have sent the order to [employer] and have received confirmation that they have received the order.

## 2024-05-03 NOTE — Telephone Encounter (Signed)
-----   Message from True Mar sent at 04/30/2024  6:37 PM EDT ----- Patient referred by PCP, seen by me on 03/08/24, diagnostic PSG on 04/23/24.    Please call and notify the patient that the recent sleep study showed moderate obstructive sleep apnea. She did not sleep very well, and did not achieve and dream sleep or deep sleep. Nevertheless, I  recommend treatment of her OSA in the form of autoPAP. We may consider at a CPAP titration study at a later date, if need be, which means, that we would ask her to come back in for a second sleep  study with CPAP treatment. For now, I would like to start her on a so-called autoPAP machine at home, through a DME company (of her choice, or as per insurance requirement). The DME representative  will educate her on how to use the machine, how to put the mask on, etc. I have placed an order in the chart. Please send referral, talk to patient, send report to referring MD. We will need a FU in  sleep clinic for 10 weeks post-PAP set up, please arrange that with me or one of our NPs. Thanks,   True Mar, MD, PhD [facility])    ----- Message ----- From: Rebecka Fleeta Higashi In One Three One Sent: 04/30/2024   6:35 PM EDT To: True Mar, MD

## 2024-05-07 ENCOUNTER — Encounter: Admitting: Emergency Medicine

## 2024-05-07 DIAGNOSIS — J849 Interstitial pulmonary disease, unspecified: Secondary | ICD-10-CM

## 2024-05-07 DIAGNOSIS — R0602 Shortness of breath: Secondary | ICD-10-CM

## 2024-05-07 NOTE — Progress Notes (Signed)
 Daily Session Note  Patient Details  Name: Erin Contreras MRN: [id number] Date of Birth: July 15, 1961 Referring Provider:   Flowsheet Row Pulmonary Rehab from 02/16/2024 in [facility] Cardiac and Pulmonary Rehab  Referring Provider Brena Reusing, MD    Encounter Date: 05/07/2024  Check In:  Session Check In - 05/07/24 1347       Check-In   Supervising physician immediately available to respond to emergencies See telemetry face sheet for immediately available ER MD    Location [facility]-Cardiac & Pulmonary Rehab    Staff Present Leita Franks RN,[name];[facility] BS, [name];[name], MS, [name]    Virtual Visit No    Medication changes reported     No    Fall or balance concerns reported    No    Warm-up and Cool-down Performed on first and last piece of equipment    Resistance Training Performed Yes    VAD Patient? No    PAD/SET Patient? No      Pain Assessment   Currently in Pain? No/denies             Social History   Tobacco Use  Smoking Status Never  Smokeless Tobacco Never    Goals Met:  Proper associated with RPD/PD & O2 Sat Independence with exercise equipment Using PLB without cueing & demonstrates good technique Exercise tolerated well No report of concerns or symptoms today Strength training completed today  Goals Unmet:  Not Applicable  Comments: Pt able to follow exercise prescription today without complaint.  Will continue to monitor for progression.    Dr. Oneil Pinal is Medical Director for [facility] Cardiac Rehabilitation.  Dr. Fuad Aleskerov is Medical Director for [facility] Pulmonary Rehabilitation.

## 2024-05-08 NOTE — Telephone Encounter (Signed)
 RE: new autopap machine Received: Today New, Adine Neysa Nena GORMAN, RN; Joylene Carlean Sheree Leveda Jackson Easter Delsa Eleanor; 1 other Received, thank you!       Previous Messages    ----- Message ----- From: Neysa Nena GORMAN, RN Sent: 05/03/2024   3:56 PM EDT To: Adine Joylene; Avelina Jackson; Ephraim Dollar* Subject: new autopap machine                            New order in epic for pt.  She lives in [place]. [place]  DME: [facility] [employer] / [place], [place] [facility] Phone: [phone], press option 1 Fax: [phone]  Lacie DOROTHA Lesches Female, 63 y.o., 10-27-1960 Pronouns: she/her/hers MRN: [id number] Phone: [phone]  Thanks ,  Particia Peak

## 2024-05-09 ENCOUNTER — Encounter: Admitting: Emergency Medicine

## 2024-05-09 DIAGNOSIS — R0602 Shortness of breath: Secondary | ICD-10-CM

## 2024-05-09 DIAGNOSIS — J849 Interstitial pulmonary disease, unspecified: Secondary | ICD-10-CM

## 2024-05-09 NOTE — Progress Notes (Signed)
 Daily Session Note  Patient Details  Name: Erin Contreras MRN: [id number] Date of Birth: 12-20-1960 Referring Provider:   Flowsheet Row Pulmonary Rehab from 02/16/2024 in [facility] Cardiac and Pulmonary Rehab  Referring Provider Brena Reusing, MD    Encounter Date: 05/09/2024  Check In:  Session Check In - 05/09/24 1410       Check-In   Supervising physician immediately available to respond to emergencies See telemetry face sheet for immediately available ER MD    Location [facility]-Cardiac & Pulmonary Rehab    Staff Present Devaughn Jaeger, BS, [name];[name], [name], [name];Lowery Paullin RN,[name];Kelly Bollinger [facility]    Virtual Visit No    Medication changes reported     No    Fall or balance concerns reported    No    Warm-up and Cool-down Performed on first and last piece of equipment    Resistance Training Performed Yes    VAD Patient? No    PAD/SET Patient? No      Pain Assessment   Currently in Pain? No/denies             Social History   Tobacco Use  Smoking Status Never  Smokeless Tobacco Never    Goals Met:  Proper associated with RPD/PD & O2 Sat Independence with exercise equipment Using PLB without cueing & demonstrates good technique Exercise tolerated well No report of concerns or symptoms today Strength training completed today  Goals Unmet:  Not Applicable  Comments: Pt able to follow exercise prescription today without complaint.  Will continue to monitor for progression.    Dr. Oneil Pinal is Medical Director for [facility] Cardiac Rehabilitation.  Dr. Fuad Aleskerov is Medical Director for [facility] Pulmonary Rehabilitation.

## 2024-05-14 ENCOUNTER — Encounter: Admitting: Emergency Medicine

## 2024-05-14 DIAGNOSIS — R0602 Shortness of breath: Secondary | ICD-10-CM

## 2024-05-14 DIAGNOSIS — J849 Interstitial pulmonary disease, unspecified: Secondary | ICD-10-CM

## 2024-05-14 NOTE — Progress Notes (Signed)
 Daily Session Note  Patient Details  Name: Erin Contreras MRN: [id number] Date of Birth: 12-31-1960 Referring Provider:   Flowsheet Row Pulmonary Rehab from 02/16/2024 in [facility] Cardiac and Pulmonary Rehab  Referring Provider Brena Reusing, MD    Encounter Date: 05/14/2024  Check In:  Session Check In - 05/14/24 1344       Check-In   Supervising physician immediately available to respond to emergencies See telemetry face sheet for immediately available ER MD    Location [facility]-Cardiac & Pulmonary Rehab    Staff Present Rollene Paterson, MS, [name];[name], BS, [name];Maxon Conetta BS, [name];Etheleen Valtierra RN,[name]    Virtual Visit No    Medication changes reported     No    Fall or balance concerns reported    No    Warm-up and Cool-down Performed on first and last piece of equipment    Resistance Training Performed Yes    VAD Patient? No    PAD/SET Patient? No      Pain Assessment   Currently in Pain? No/denies             Social History   Tobacco Use  Smoking Status Never  Smokeless Tobacco Never    Goals Met:  Proper associated with RPD/PD & O2 Sat Independence with exercise equipment Using PLB without cueing & demonstrates good technique Exercise tolerated well No report of concerns or symptoms today Strength training completed today  Goals Unmet:  Not Applicable  Comments: Pt able to follow exercise prescription today without complaint.  Will continue to monitor for progression.    Dr. Oneil Pinal is Medical Director for [facility] Cardiac Rehabilitation.  Dr. Fuad Aleskerov is Medical Director for [facility] Pulmonary Rehabilitation.

## 2024-05-16 ENCOUNTER — Encounter

## 2024-05-16 DIAGNOSIS — J849 Interstitial pulmonary disease, unspecified: Secondary | ICD-10-CM

## 2024-05-16 DIAGNOSIS — R0602 Shortness of breath: Secondary | ICD-10-CM

## 2024-05-16 NOTE — Progress Notes (Signed)
 Daily Session Note  Patient Details  Name: Erin Contreras MRN: [id number] Date of Birth: 09-Jul-1961 Referring Provider:   Flowsheet Row Pulmonary Rehab from 02/16/2024 in [facility] Cardiac and Pulmonary Rehab  Referring Provider Brena Reusing, MD    Encounter Date: 05/16/2024  Check In:  Session Check In - 05/16/24 1348       Check-In   Supervising physician immediately available to respond to emergencies See telemetry face sheet for immediately available ER MD    Location [facility]-Cardiac & Pulmonary Rehab    Staff Present Burnard Davenport RN,[name],MPA;Meredith Tressa RN,[name];[name], BS, [name];[name], MS, [name]    Virtual Visit No    Medication changes reported     No    Fall or balance concerns reported    No    Warm-up and Cool-down Performed on first and last piece of equipment    Resistance Training Performed Yes    VAD Patient? No    PAD/SET Patient? No      Pain Assessment   Currently in Pain? No/denies             Social History   Tobacco Use  Smoking Status Never  Smokeless Tobacco Never    Goals Met:  Proper associated with RPD/PD & O2 Sat Independence with exercise equipment Using PLB without cueing & demonstrates good technique Exercise tolerated well No report of concerns or symptoms today Strength training completed today  Goals Unmet:  Not Applicable  Comments: Pt able to follow exercise prescription today without complaint.  Will continue to monitor for progression.    Dr. Oneil Pinal is Medical Director for [facility] Cardiac Rehabilitation.  Dr. Fuad Aleskerov is Medical Director for [facility] Pulmonary Rehabilitation.

## 2024-05-21 ENCOUNTER — Encounter: Admitting: Emergency Medicine

## 2024-05-21 DIAGNOSIS — R0602 Shortness of breath: Secondary | ICD-10-CM | POA: Diagnosis not present

## 2024-05-21 DIAGNOSIS — J849 Interstitial pulmonary disease, unspecified: Secondary | ICD-10-CM

## 2024-05-21 NOTE — Progress Notes (Signed)
 Daily Session Note  Patient Details  Name: Erin Contreras MRN: [id number] Date of Birth: 13-Jan-1961 Referring Provider:   Flowsheet Row Pulmonary Rehab from 02/16/2024 in [facility] Cardiac and Pulmonary Rehab  Referring Provider Brena Reusing, MD    Encounter Date: 05/21/2024  Check In:  Session Check In - 05/21/24 1345       Check-In   Supervising physician immediately available to respond to emergencies See telemetry face sheet for immediately available ER MD    Location [facility]-Cardiac & Pulmonary Rehab    Staff Present Rollene Paterson, MS, [name];[name], BS, [name];Arbie Reisz RN,[name];Maxon Conetta BS, [name]    Virtual Visit No    Medication changes reported     No    Fall or balance concerns reported    No    Warm-up and Cool-down Performed on first and last piece of equipment    Resistance Training Performed Yes    VAD Patient? No    PAD/SET Patient? No      Pain Assessment   Currently in Pain? No/denies             Social History   Tobacco Use  Smoking Status Never  Smokeless Tobacco Never    Goals Met:  Proper associated with RPD/PD & O2 Sat Independence with exercise equipment Using PLB without cueing & demonstrates good technique Exercise tolerated well No report of concerns or symptoms today Strength training completed today  Goals Unmet:  Not Applicable  Comments: Pt able to follow exercise prescription today without complaint.  Will continue to monitor for progression.    Dr. Oneil Pinal is Medical Director for [facility] Cardiac Rehabilitation.  Dr. Fuad Aleskerov is Medical Director for [facility] Pulmonary Rehabilitation.

## 2024-05-23 ENCOUNTER — Encounter: Payer: Self-pay | Admitting: *Deleted

## 2024-05-23 ENCOUNTER — Encounter

## 2024-05-23 DIAGNOSIS — R0602 Shortness of breath: Secondary | ICD-10-CM | POA: Diagnosis not present

## 2024-05-23 DIAGNOSIS — J849 Interstitial pulmonary disease, unspecified: Secondary | ICD-10-CM

## 2024-05-23 NOTE — Progress Notes (Signed)
 Pulmonary Individual Treatment Plan  Patient Details  Name: Erin Contreras MRN: [id number] Date of Birth: Apr 27, 1961 Referring Provider:   Flowsheet Row Pulmonary Rehab from 02/16/2024 in [facility] Cardiac and Pulmonary Rehab  Referring Provider Brena Reusing, MD    Initial Encounter Date:  Flowsheet Row Pulmonary Rehab from 02/16/2024 in [facility] Cardiac and Pulmonary Rehab  Date 02/16/24    Visit Diagnosis: Shortness of breath  Interstitial lung disease (HCC)  Patient's Home Medications on Admission:  Current Outpatient Medications:    acyclovir  (ZOVIRAX ) 400 MG tablet, Take 1 tablet (400 mg total) by mouth 2 (two) times daily as needed., Disp: 180 tablet, Rfl: 4   Cholecalciferol  1.25 MG (50000 UT) TABS, Take 1 tablet by mouth once a week., Disp: 12 tablet, Rfl: 4   conjugated estrogens  (PREMARIN ) vaginal cream, Place 0.25 Applicatorfuls vaginally at bedtime for 14 days, THEN 0.25 Applicatorfuls every other day for 14 days, THEN 0.25 Applicatorfuls 2 (two) times a week., Disp: 42.5 g, Rfl: 12   DULoxetine  (CYMBALTA ) 20 MG capsule, Take 2 capsules (40 mg total) by mouth every morning., Disp: 60 capsule, Rfl: 2   famotidine  (PEPCID ) 40 MG tablet, Take 1 tablet (40 mg total) by mouth at bedtime., Disp: 90 tablet, Rfl: 3   folic acid  (FOLVITE ) 1 MG tablet, Take 1 mg by mouth daily., Disp: , Rfl:    hydroxychloroquine (PLAQUENIL) 200 MG tablet, Take 200 mg by mouth 2 (two) times daily., Disp: , Rfl:    meloxicam  (MOBIC ) 15 MG tablet, Take 15 mg by mouth daily., Disp: , Rfl:    methotrexate  (RHEUMATREX) 2.5 MG tablet, Take 20 mg by mouth once a week., Disp: , Rfl:    pantoprazole  (PROTONIX ) 40 MG tablet, Take 1 tablet (40 mg total) by mouth 2 (two) times daily., Disp: 180 tablet, Rfl: 3   pregabalin  (LYRICA ) 150 MG capsule, Take 1 capsule (150 mg total) by mouth at bedtime., Disp: 30 capsule, Rfl: 5   Tocilizumab (ACTEMRA ACTPEN) 162 MG/0.9ML SOAJ, Inject into the skin., Disp: , Rfl:     vitamin B-12 (CYANOCOBALAMIN ) 100 MCG tablet, Take 100 mcg by mouth daily., Disp: , Rfl:   Past Medical History: Past Medical History:  Diagnosis Date   Boils    Ear mass    GERD (gastroesophageal reflux disease)    Hemorrhoids    Herpes simplex virus infection    HSV infection    Lumbago with sciatica    OA (osteoarthritis) of knee    Obesity    Overactive bladder    PONV (postoperative nausea and vomiting)    Pre-diabetes    RA (rheumatoid arthritis) (HCC)    Sleep apnea    does not use cpap-had bariatric surgery and has no issues since surgery   Urge and stress incontinence     Tobacco Use: Social History   Tobacco Use  Smoking Status Never  Smokeless Tobacco Never    Labs: Review Flowsheet  More data exists      Latest Ref Rng & Units 04/09/2022 10/08/2022 01/25/2023 07/28/2023 12/06/2023  Labs for ITP Cardiac and Pulmonary Rehab  Cholestrol 100 - 199 mg/dL 757  792  815  813  771   LDL (calc) 0 - 99 mg/dL 859  883  898  891  857   HDL-C >39 mg/dL 78  65  61  60  62   Trlycerides 0 - 149 mg/dL 859  848  874  899  863   Hemoglobin A1c 4.8 - 5.6 %  6.2  6.2  6.3  6.1  5.8      Pulmonary Assessment Scores:  Pulmonary Assessment Scores     Row Name 02/16/24 1025         ADL UCSD   ADL Phase Entry     SOB Score total 62     Rest 3     Walk 2     Stairs 2     Bath 2     Dress 2     Shop 3       CAT Score   CAT Score 11       mMRC Score   mMRC Score 4        UCSD: Self-administered rating of dyspnea associated with activities of daily living (ADLs) 6-point scale (0 = not at all to 5 = maximal or unable to do because of breathlessness)  Scoring Scores range from 0 to 120.  Minimally important difference is 5 units  CAT: CAT can identify the health impairment of COPD patients and is better correlated with disease progression.  CAT has a scoring range of zero to 40. The CAT score is classified into four groups of low (less than 10), medium (10 - 20),  high (21-30) and very high (31-40) based on the impact level of disease on health status. A CAT score over 10 suggests significant symptoms.  A worsening CAT score could be explained by an exacerbation, poor medication adherence, poor inhaler technique, or progression of COPD or comorbid conditions.  CAT MCID is 2 points  mMRC: mMRC (Modified Medical Research Council) Dyspnea Scale is used to assess the degree of baseline functional disability in patients of respiratory disease due to dyspnea. No minimal important difference is established. A decrease in score of 1 point or greater is considered a positive change.   Pulmonary Function Assessment:   Exercise Target Goals: Exercise Program Goal: Individual exercise prescription set using results from initial 6 min walk test and THRR while considering  patient's activity barriers and safety.   Exercise Prescription Goal: Initial exercise prescription builds to 30-45 minutes a day of aerobic activity, 2-3 days per week.  Home exercise guidelines will be given to patient during program as part of exercise prescription that the participant will acknowledge.  Education: Aerobic Exercise: - Group verbal and visual presentation on the components of exercise prescription. Introduces F.I.T.T principle from ACSM for exercise prescriptions.  Reviews F.I.T.T. principles of aerobic exercise including progression. Written material provided at class time. Flowsheet Row Pulmonary Rehab from 05/16/2024 in [facility] Cardiac and Pulmonary Rehab  Date 05/16/24  Educator mb    Education: Resistance Exercise: - Group verbal and visual presentation on the components of exercise prescription. Introduces F.I.T.T principle from ACSM for exercise prescriptions  Reviews F.I.T.T. principles of resistance exercise including progression. Written material provided at class time. Flowsheet Row Pulmonary Rehab from 05/16/2024 in [facility] Cardiac and Pulmonary Rehab  Date 02/29/24   Educator [facility]  Instruction Review Code 1- [employer] Understanding     Education: Exercise & Equipment Safety: - Individual verbal instruction and demonstration of equipment use and safety with use of the equipment. Flowsheet Row Pulmonary Rehab from 02/16/2024 in [facility] Cardiac and Pulmonary Rehab  Date 02/16/24  Educator MB  Instruction Review Code 1- Verbalizes Understanding    Education: Exercise Physiology & General Exercise Guidelines: - Group verbal and written instruction with models to review the exercise physiology of the cardiovascular system and associated critical values. Provides general exercise guidelines with  specific guidelines to those with heart or lung disease.  Flowsheet Row Pulmonary Rehab from 05/16/2024 in [facility] Cardiac and Pulmonary Rehab  Education need identified 02/27/24  Date 05/02/24  Educator [facility]  Instruction Review Code 1- [employer] Understanding    Education: Flexibility, Balance, Mind/Body Relaxation: - Group verbal and visual presentation with interactive activity on the components of exercise prescription. Introduces F.I.T.T principle from ACSM for exercise prescriptions. Reviews F.I.T.T. principles of flexibility and balance exercise training including progression. Also discusses the mind body connection.  Reviews various relaxation techniques to help reduce and manage stress (i.e. Deep breathing, progressive muscle relaxation, and visualization). Balance handout provided to take home. Written material provided at class time. Flowsheet Row Pulmonary Rehab from 05/16/2024 in [facility] Cardiac and Pulmonary Rehab  Date 05/09/24  Educator [facility]  Instruction Review Code 1- Verbalizes Understanding    Activity Barriers & Risk Stratification:  Activity Barriers & Cardiac Risk Stratification - 02/16/24 1016       Activity Barriers & Cardiac Risk Stratification   Activity Barriers Arthritis;Joint Problems;Shortness of Breath;History of Falls;Left Knee  Replacement;Right Knee Replacement          6 Minute Walk:  6 Minute Walk     Row Name 02/16/24 1015         6 Minute Walk   Phase Initial     Distance 1100 feet     Walk Time 6 minutes     # of Rest Breaks 0     MPH 2.08     METS 2.34     RPE 11     Perceived Dyspnea  0     VO2 Peak 8.19     Symptoms No     Resting HR 78 bpm     Resting BP 126/80     Resting Oxygen Saturation  93 %     Exercise Oxygen Saturation  during 6 min walk 90 %     Max Ex. HR 125 bpm     Max Ex. BP 160/80     2 Minute Post BP 118/80       Interval HR   1 Minute HR 90     2 Minute HR 106     3 Minute HR 125     4 Minute HR 108     5 Minute HR 101     6 Minute HR 107     2 Minute Post HR 88     Interval Heart Rate? Yes       Interval Oxygen   Interval Oxygen? Yes     Baseline Oxygen Saturation % 93 %     1 Minute Oxygen Saturation % 93 %     1 Minute Liters of Oxygen 0 L     2 Minute Oxygen Saturation % 91 %     2 Minute Liters of Oxygen 0 L     3 Minute Oxygen Saturation % 91 %     3 Minute Liters of Oxygen 0 L     4 Minute Oxygen Saturation % 91 %     4 Minute Liters of Oxygen 0 L     5 Minute Oxygen Saturation % 90 %     5 Minute Liters of Oxygen 0 L     6 Minute Oxygen Saturation % 90 %     6 Minute Liters of Oxygen 0 L     2 Minute Post Oxygen Saturation % 94 %     2 Minute  Post Liters of Oxygen 0 L       Oxygen Initial Assessment:  Oxygen Initial Assessment - 02/13/24 1346       Home Oxygen   Home Oxygen Device None    Sleep Oxygen Prescription CPAP   not using,has an appt scheduled to be fitted for new CPAP   Home Exercise Oxygen Prescription None    Home Resting Oxygen Prescription None      Intervention   Short Term Goals To learn and exhibit compliance with exercise, home and travel O2 prescription;To learn and understand importance of monitoring SPO2 with pulse oximeter and demonstrate accurate use of the pulse oximeter.;To learn and understand importance of  maintaining oxygen saturations>88%;To learn and demonstrate proper pursed lip breathing techniques or other breathing techniques. ;To learn and demonstrate proper use of respiratory medications    Long  Term Goals Exhibits compliance with exercise, home  and travel O2 prescription;Verbalizes importance of monitoring SPO2 with pulse oximeter and return demonstration;Maintenance of O2 saturations>88%;Exhibits proper breathing techniques, such as pursed lip breathing or other method taught during program session;Compliance with respiratory medication          Oxygen Re-Evaluation:  Oxygen Re-Evaluation     Row Name 02/27/24 1414 03/26/24 1359 04/23/24 1552 05/21/24 1621       Program Oxygen Prescription   Program Oxygen Prescription -- -- -- None      Home Oxygen   Home Oxygen Device -- None None None    Sleep Oxygen Prescription -- CPAP CPAP CPAP    Home Exercise Oxygen Prescription -- None None None    Home Resting Oxygen Prescription -- None None None      Goals/Expected Outcomes   Short Term Goals -- To learn and demonstrate proper pursed lip breathing techniques or other breathing techniques.  To learn and demonstrate proper pursed lip breathing techniques or other breathing techniques.  To learn and demonstrate proper pursed lip breathing techniques or other breathing techniques.     Long  Term Goals -- Exhibits proper breathing techniques, such as pursed lip breathing or other method taught during program session Exhibits proper breathing techniques, such as pursed lip breathing or other method taught during program session Exhibits proper breathing techniques, such as pursed lip breathing or other method taught during program session    Comments Reviewed PLB technique with pt.  Talked about how it works and it's importance in maintaining their exercise saturations. Reviewed PLB technique with pt. Talked about how it works and it's importance in maintaining their exercise saturations.  Erin Contreras reports that she does occasionally experience SOB at home with different activities. We reviewed PLB technique with pt. Talked about how it works and it's importance in maintaining her oxygen saturations and improving SOB. Erin Contreras reports that she does occasionally experience SOB at home with different activities, but has stated she feels like it is due to her tiredness. We reviewed PLB technique with pt. Talked about how it works and it's importance in maintaining her oxygen saturations and improving SOB.    Goals/Expected Outcomes Short: Become more profiecient at using PLB.   Long: Become independent at using PLB. Short: Become more profiecient at using PLB. Long: Become independent at using PLB. Short: Become more profiecient at using PLB. Long: Become independent at using PLB. Short: Become more profiecient at using PLB. Long: Become independent at using PLB.       Oxygen Discharge (Final Oxygen Re-Evaluation):  Oxygen Re-Evaluation - 05/21/24 1621  Program Oxygen Prescription   Program Oxygen Prescription None      Home Oxygen   Home Oxygen Device None    Sleep Oxygen Prescription CPAP    Home Exercise Oxygen Prescription None    Home Resting Oxygen Prescription None      Goals/Expected Outcomes   Short Term Goals To learn and demonstrate proper pursed lip breathing techniques or other breathing techniques.     Long  Term Goals Exhibits proper breathing techniques, such as pursed lip breathing or other method taught during program session    Comments Tequila reports that she does occasionally experience SOB at home with different activities, but has stated she feels like it is due to her tiredness. We reviewed PLB technique with pt. Talked about how it works and it's importance in maintaining her oxygen saturations and improving SOB.    Goals/Expected Outcomes Short: Become more profiecient at using PLB. Long: Become independent at using PLB.          Initial Exercise  Prescription:  Initial Exercise Prescription - 02/16/24 1000       Date of Initial Exercise RX and Referring Provider   Date 02/16/24    Referring Provider Brena Reusing, MD      Oxygen   Maintain Oxygen Saturation 88% or higher      Treadmill   MPH 1.8   Try   Grade 0    Minutes 15    METs 2.38      Recumbant Bike   Level 2    RPM 50    Watts 25    Minutes 15    METs 2.34      REL-XR   Level 1    Speed 50    Minutes 15    METs 2.34      T5 Nustep   Level 2    SPM 80    Minutes 15    METs 2.34      Track   Laps 25    Minutes 15    METs 2.36      Prescription Details   Frequency (times per week) 2    Duration Progress to 30 minutes of continuous aerobic without signs/symptoms of physical distress      Intensity   THRR 40-80% of Max Heartrate 109-140    Ratings of Perceived Exertion 11-13    Perceived Dyspnea 0-4      Progression   Progression Continue to progress workloads to maintain intensity without signs/symptoms of physical distress.      Resistance Training   Training Prescription Yes    Weight 4 lb    Reps 10-15          Perform Capillary Blood Glucose checks as needed.  Exercise Prescription Changes:   Exercise Prescription Changes     Row Name 02/16/24 1000 03/12/24 1700 03/29/24 1500 04/12/24 1500 04/16/24 1400     Response to Exercise   Blood Pressure (Admit) 126/80 122/70 122/86 138/82 --   Blood Pressure (Exercise) 160/80 170/90 146/84 198/90 --   Blood Pressure (Exit) 118/80 124/68 126/74 114/76 --   Heart Rate (Admit) 78 bpm 88 bpm 96 bpm 96 bpm --   Heart Rate (Exercise) 125 bpm 121 bpm 126 bpm 119 bpm --   Heart Rate (Exit) 81 bpm 87 bpm 90 bpm 105 bpm --   Oxygen Saturation (Admit) 93 % 94 % 93 % 95 % --   Oxygen Saturation (Exercise) 90 % 90 % 89 % 88 % --  Oxygen Saturation (Exit) 94 % 94 % 90 % 92 % --   Rating of Perceived Exertion (Exercise) 11 13 15 13  --   Perceived Dyspnea (Exercise) 0 0 0 0 --    Symptoms none none none none --   Comments results 1st 2 weeks of exercise sessions -- -- --   Duration -- Progress to 30 minutes of  aerobic without signs/symptoms of physical distress Continue with 30 min of aerobic exercise without signs/symptoms of physical distress. Continue with 30 min of aerobic exercise without signs/symptoms of physical distress. --   Intensity THRR New THRR unchanged THRR unchanged THRR unchanged --     Progression   Progression -- Continue to progress workloads to maintain intensity without signs/symptoms of physical distress. Continue to progress workloads to maintain intensity without signs/symptoms of physical distress. Continue to progress workloads to maintain intensity without signs/symptoms of physical distress. --   Average METs 2.34 2.26 2.59 2.53 --     Resistance Training   Training Prescription -- Yes Yes Yes --   Weight -- 4 lb 4 lb 4 lb --   Reps -- 10-15 10-15 10-15 --     Interval Training   Interval Training -- No No No --     Treadmill   MPH -- 1.8 1.8 1.6 --   Grade -- 0 0 0 --   Minutes -- 15 15 15  --   METs -- 2.38 2.38 2.23 --     Recumbant Bike   Level -- 3 -- -- --   Watts -- 15 -- -- --   Minutes -- 15 -- -- --   METs -- 2.34 -- -- --     REL-XR   Level -- 2 2 1  --   Minutes -- 15 15 15  --   METs -- 2.9 3.6 3.3 --     T5 Nustep   Level -- 2 2 2  --   Minutes -- 15 15 15  --   METs -- 1.8 1.8 1.8 --     Track   Laps -- -- 32 -- --   Minutes -- -- 15 -- --   METs -- -- 2.74 -- --     Home Exercise Plan   Plans to continue exercise at -- -- -- -- [employer] (comment)  [employer] [facility] [employer], aerobic exercise, and other group fitness classes, walking and biking at home, and sit down exercises   Frequency -- -- -- -- Add 2 additional days to program exercise sessions.   Initial Home Exercises Provided -- -- -- -- 04/16/24     Oxygen   Maintain Oxygen Saturation -- 88% or higher 88% or higher 88% or  higher --    Row Name 04/26/24 1400 05/09/24 1600 05/23/24 0700         Response to Exercise   Blood Pressure (Admit) 118/78 112/74 122/68     Blood Pressure (Exercise) 150/86 -- --     Blood Pressure (Exit) 118/78 114/70 104/66     Heart Rate (Admit) 85 bpm 96 bpm 79 bpm     Heart Rate (Exercise) 103 bpm 110 bpm 106 bpm     Heart Rate (Exit) 93 bpm 103 bpm 94 bpm     Oxygen Saturation (Admit) 94 % 92 % 93 %     Oxygen Saturation (Exercise) 90 % 91 % 92 %     Oxygen Saturation (Exit) 91 % 96 % 94 %  Rating of Perceived Exertion (Exercise) 15 12 13      Perceived Dyspnea (Exercise) 2 0 1     Symptoms none none none     Duration Continue with 30 min of aerobic exercise without signs/symptoms of physical distress. Continue with 30 min of aerobic exercise without signs/symptoms of physical distress. Continue with 30 min of aerobic exercise without signs/symptoms of physical distress.     Intensity THRR unchanged THRR unchanged THRR unchanged       Progression   Progression Continue to progress workloads to maintain intensity without signs/symptoms of physical distress. Continue to progress workloads to maintain intensity without signs/symptoms of physical distress. Continue to progress workloads to maintain intensity without signs/symptoms of physical distress.     Average METs 2.36 2.31 2.2       Resistance Training   Training Prescription Yes Yes Yes     Weight 4 lb 4 lb 4 lb     Reps 10-15 10-15 10-15       Interval Training   Interval Training No No No       Treadmill   MPH 1.7 1.4 1.6     Grade 0 0 0     Minutes 15 15 15      METs 2.3 2.07 2.23       Recumbant Bike   Level 3 2 --     Watts 15 15 --     Minutes 15 15 --     METs 2.34 2.34 --       NuStep   Level 1 1  T6 --     SPM 80 -- --     Minutes 15 15 --     METs 2 1.8 --       REL-XR   Level 1 2 3      Minutes 15 15 15      METs 3 2.6 2.7       T5 Nustep   Level 1 2 2      Minutes 15 15 15      METs 1.9  1.8 1.9       Biostep-RELP   Level 1 -- --     SPM 50 -- --     Minutes 15 -- --     METs 2 -- --       Home Exercise Plan   Plans to continue exercise at [employer] (comment)  [employer], aerobic exercise, and other group fitness classes, walking and biking at home, and sit down exercises [employer] (comment)  [employer], aerobic exercise, and other group fitness classes, walking and biking at home, and sit down exercises [employer] (comment)  [employer], aerobic exercise, and other group fitness classes, walking and biking at home, and sit down exercises     Frequency Add 2 additional days to program exercise sessions. Add 2 additional days to program exercise sessions. Add 2 additional days to program exercise sessions.     Initial Home Exercises Provided 04/16/24 04/16/24 04/16/24       Oxygen   Maintain Oxygen Saturation 88% or higher 88% or higher 88% or higher        Exercise Comments:   Exercise Comments     Row Name 02/27/24 1412           Exercise Comments First full day of exercise!  Patient was oriented to gym and equipment including functions, settings, policies, and procedures.  Patient's individual  exercise prescription and treatment plan were reviewed.  All starting workloads were established based on the results of the 6 minute walk test done at initial orientation visit.  The plan for exercise progression was also introduced and progression will be customized based on patient's performance and goals.          Exercise Goals and Review:   Exercise Goals     Row Name 02/16/24 1020             Exercise Goals   Increase Physical Activity Yes       Intervention Provide advice, education, support and counseling about physical activity/exercise needs.;Develop an individualized exercise prescription for aerobic and resistive training based on initial evaluation findings,  risk stratification, comorbidities and participant's personal goals.       Expected Outcomes Short Term: Attend rehab on a regular basis to increase amount of physical activity.;Long Term: Add in home exercise to make exercise part of routine and to increase amount of physical activity.;Long Term: Exercising regularly at least 3-5 days a week.       Increase Strength and Stamina Yes       Intervention Provide advice, education, support and counseling about physical activity/exercise needs.;Develop an individualized exercise prescription for aerobic and resistive training based on initial evaluation findings, risk stratification, comorbidities and participant's personal goals.       Expected Outcomes Short Term: Increase workloads from initial exercise prescription for resistance, speed, and METs.;Short Term: Perform resistance training exercises routinely during rehab and add in resistance training at home;Long Term: Improve cardiorespiratory fitness, muscular endurance and strength as measured by increased METs and functional capacity ( )       Able to understand and use rate of perceived exertion (RPE) scale Yes       Intervention Provide education and explanation on how to use RPE scale       Expected Outcomes Short Term: Able to use RPE daily in rehab to express subjective intensity level;Long Term:  Able to use RPE to guide intensity level when exercising independently       Able to understand and use Dyspnea scale Yes       Intervention Provide education and explanation on how to use Dyspnea scale       Expected Outcomes Short Term: Able to use Dyspnea scale daily in rehab to express subjective sense of shortness of breath during exertion;Long Term: Able to use Dyspnea scale to guide intensity level when exercising independently       Knowledge and understanding of Target Heart Rate Range (THRR) Yes       Intervention Provide education and explanation of THRR including how the numbers were  predicted and where they are located for reference       Expected Outcomes Short Term: Able to state/look up THRR;Short Term: Able to use daily as guideline for intensity in rehab;Long Term: Able to use THRR to govern intensity when exercising independently       Able to check pulse independently Yes       Intervention Provide education and demonstration on how to check pulse in carotid and radial arteries.;Review the importance of being able to check your own pulse for safety during independent exercise       Expected Outcomes Short Term: Able to explain why pulse checking is important during independent exercise;Long Term: Able to check pulse independently and accurately       Understanding of Exercise Prescription Yes       Intervention  Provide education, explanation, and written materials on patient's individual exercise prescription       Expected Outcomes Short Term: Able to explain program exercise prescription;Long Term: Able to explain home exercise prescription to exercise independently          Exercise Goals Re-Evaluation :  Exercise Goals Re-Evaluation     Row Name 02/27/24 1413 03/12/24 1712 03/26/24 1357 03/29/24 1542 04/12/24 1514     Exercise Goal Re-Evaluation   Exercise Goals Review Increase Physical Activity;Knowledge and understanding of Target Heart Rate Range (THRR);Able to understand and use rate of perceived exertion (RPE) scale;Understanding of Exercise Prescription;Increase Strength and Stamina;Able to check pulse independently Increase Physical Activity;Understanding of Exercise Prescription;Increase Strength and Stamina -- Increase Physical Activity;Understanding of Exercise Prescription;Increase Strength and Stamina Increase Physical Activity;Understanding of Exercise Prescription;Increase Strength and Stamina   Comments Reviewed RPE and dyspnea scale, THR and program prescription with pt today.  Pt voiced understanding and was given a copy of goals to take home.  Erin Contreras is off to a good start in the program and completed her first 2 weeks of exercise sessions in this review. She had workload on the treadmill of a speed of 1.8 mph with no incline. She worked at level 2 on the T5 nustep and XR. She worked at level 3 on the recumbent bike. We will continue to monitor her progress in the program. Erin Contreras is doing well here at rehab. She is doing line dancing 2 days per week on top of rehab exercise. Encouraged her to contineu to exercise at home, and focus on increasing workload here at rehab. Erin Contreras is doing well in rehab. She has continued to work at level 2 on both the XR and T5 nustep machines. She also increased her walking distance to 32 laps on the track. We will continue to monitor her progress in the program. Erin Contreras is doing well in rehab. She maintained level 2 on the T5 nustep. She decreased her speed slightly to 1.6 mph on the treadmill and decreased to level 1 on the XR. We will continue to monitor her progress in the program.   Expected Outcomes Short: Use RPE daily to regulate intensity.  Long: Follow program prescription in THR. Short: Continue to follow current exercise prescription. Long: Continue exercise to improve strength and stamina. STG: Continue to follow current exercise prescription. LTG: Continue exercise to improve strength and stamina. Short: Begin to progressively increase workloads. Long: Continue exercise to improve strength and stamina. Short: Try to get back to level 2 on the XR and increase speed on the treadmill. Long: Continue exercise to improve strength and stamina.    Row Name 04/16/24 1443 04/26/24 1419 05/07/24 1349 05/09/24 1616 05/23/24 0732     Exercise Goal Re-Evaluation   Exercise Goals Review Increase Physical Activity;Able to understand and use rate of perceived exertion (RPE) scale;Knowledge and understanding of Target Heart Rate Range (THRR);Understanding of Exercise Prescription;Increase Strength and Stamina;Able to  understand and use Dyspnea scale;Able to check pulse independently Increase Physical Activity;Understanding of Exercise Prescription;Increase Strength and Stamina Increase Physical Activity;Increase Strength and Stamina;Understanding of Exercise Prescription Increase Physical Activity;Increase Strength and Stamina;Understanding of Exercise Prescription Increase Physical Activity;Increase Strength and Stamina;Understanding of Exercise Prescription   Comments Reviewed home exercise with pt today from 2:09pm to 2:19pm.  Pt plans to go to [employer] for line dancing, aerobic exercise, and other group fitness classes, and walk, bike, and do sit down exercises at home for exercise. She plans to add 2 addtional days  of exercise at home.  Reviewed THR, pulse, RPE, sign and symptoms, pulse oximetery and when to call 911 or MD.  Also discussed weather considerations and indoor options.  Pt voiced understanding. Gloria continues to do well in rehab. She increased her workload on the treadmill to a speed of 1.7 mph with no incline. She maintained level 3 on the recumbent bike and level 1 on the XR. She also worked at level 1 on the biostep adn T4 nustep. We will continue to monitor her progress in the program. Home exercise follow-up done today. She reports that she is consistently doing zumba dance and drum classes for exercise 2-3 days a week outside of class. She is trying to get consistent with doing some hand weight exercises at home, but has had more trouble getting consistent with that. Erin Contreras is doing good in rehab. She was recently able to increase from level 1 to 2 on both the XR and T5 nustep. She was also able to maintain a treadmill workload of 1. with no incline. We will continue to monitor her progress in the program. Erin Contreras continues to do well in rehab. She was recently able to increase from level 2 to 3 on the XR. She was also able to increase her speed on the treadmill from 1.4 to 1.22mph. We will  continue to monitor her progress in the program.   Expected Outcomes Short : Add 2 additional days of exercise at home. Long: Continue to exercise at home independently. Short: Continue to progressively increase treadmill and XR workloads. Long: Continue exercise to improve strength and stamina. Short: start to incorporate weight training into exercise routine. Long: maintain independent exericse routine upon graduation. Short: Continue to follow exercise prescription, increase workloads when appropriate. Long: Continue exercise to improve strength and stamina. Short: Continue to increase treadmill workloads. Long: Continue exercise to improve strength and stamina.      Discharge Exercise Prescription (Final Exercise Prescription Changes):  Exercise Prescription Changes - 05/23/24 0700       Response to Exercise   Blood Pressure (Admit) 122/68    Blood Pressure (Exit) 104/66    Heart Rate (Admit) 79 bpm    Heart Rate (Exercise) 106 bpm    Heart Rate (Exit) 94 bpm    Oxygen Saturation (Admit) 93 %    Oxygen Saturation (Exercise) 92 %    Oxygen Saturation (Exit) 94 %    Rating of Perceived Exertion (Exercise) 13    Perceived Dyspnea (Exercise) 1    Symptoms none    Duration Continue with 30 min of aerobic exercise without signs/symptoms of physical distress.    Intensity THRR unchanged      Progression   Progression Continue to progress workloads to maintain intensity without signs/symptoms of physical distress.    Average METs 2.2      Resistance Training   Training Prescription Yes    Weight 4 lb    Reps 10-15      Interval Training   Interval Training No      Treadmill   MPH 1.6    Grade 0    Minutes 15    METs 2.23      REL-XR   Level 3    Minutes 15    METs 2.7      T5 Nustep   Level 2    Minutes 15    METs 1.9      Home Exercise Plan   Plans to continue exercise at [employer] (comment)  [employer] for [employer], aerobic exercise, and other  group fitness classes, walking and biking at home, and sit down exercises   Frequency Add 2 additional days to program exercise sessions.    Initial Home Exercises Provided 04/16/24      Oxygen   Maintain Oxygen Saturation 88% or higher          Nutrition:  Target Goals: Understanding of nutrition guidelines, daily intake of sodium 1500mg , cholesterol 200mg , calories 30% from fat and 7% or less from saturated fats, daily to have 5 or more servings of fruits and vegetables.  Education: Nutrition 1 -Group instruction provided by verbal, written material, interactive activities, discussions, models, and posters to present general guidelines for heart healthy nutrition including macronutrients, label reading, and promoting whole foods over processed counterparts. Education serves as Pensions consultant of discussion of heart healthy eating for all. Written material provided at class time. Flowsheet Row Pulmonary Rehab from 05/16/2024 in [facility] Cardiac and Pulmonary Rehab  Date 03/14/24  Educator jg  Instruction Review Code 1- Verbalizes Understanding      Education: Nutrition 2 -Group instruction provided by verbal, written material, interactive activities, discussions, models, and posters to present general guidelines for heart healthy nutrition including sodium, cholesterol, and saturated fat. Providing guidance of habit forming to improve blood pressure, cholesterol, and body weight. Written material provided at class time.     Biometrics:  Pre Biometrics - 02/16/24 1024       Pre Biometrics   Height 5' 9.5 (1.765 m)    Weight 308 lb 12.8 oz (140.1 kg)    Waist Circumference 53.9 inches    Hip Circumference 62 inches    Waist to Hip Ratio 0.87 %    BMI (Calculated) 44.96    Single Leg Stand 12.5 seconds           Nutrition Therapy Plan and Nutrition Goals:  Nutrition Therapy & Goals - 02/27/24 1534       Nutrition Therapy   Diet Cardiac, Low Na    Protein (specify units)  80g    Fiber 25 grams    Whole Grain Foods 3 servings    Saturated Fats 15 max. grams    Fruits and Vegetables 5 servings/day    Sodium 2 grams      Personal Nutrition Goals   Nutrition Goal Eat 3 times per day, small frequent meals or nutrient dense snacks    Personal Goal #2 Drink 48-64oz of non sugary beverages (not during meals)    Personal Goal #3 Get junk food out of house and build habits around not eating junk on daily basis.    Comments Erin Contreras has had bariatric surgery ~9years ago. She has gained the weight back. She reports she struggles with consistency. Says some weeks she will make good food choices, but then the next week she makes poor choices. She reports she likes junk food and eats it often at night. Spoke with her about if it was a sweet craving or habitual. She felt it was habitual. Recommended she not buy any junk food and cut out sugary beverages. She drinks ~32-48oz of water. Suggested she aim for most of her fluids be water and if she wants sometime different it should be a not caloric beverage. She wants to find a weight loss plan that is sustainable and healthy. Spoke with her about foods and what a nutrition plan could look like. She is interested in making changes, RD will work on filling in knowledge gaps  and checking in throughout rehab program to assess progress and consistency towards nutrition goals. Provided mediterranean diet handout, educated on types of fats, sources, and how to read labels. Brainstormed several protein carb paired meals and snacks with foods she likes and will eat.      Intervention Plan   Intervention Prescribe, educate and counsel regarding individualized specific dietary modifications aiming towards targeted core components such as weight, hypertension, lipid management, diabetes, heart failure and other comorbidities.;Nutrition handout(s) given to patient.    Expected Outcomes Long Term Goal: Adherence to prescribed nutrition plan.;Short Term  Goal: A plan has been developed with personal nutrition goals set during dietitian appointment.;Short Term Goal: Understand basic principles of dietary content, such as calories, fat, sodium, cholesterol and nutrients.          Nutrition Assessments:  MEDIFICTS Score Key: >=70 Need to make dietary changes  40-70 Heart Healthy Diet <= 40 Therapeutic Level Cholesterol Diet  Flowsheet Row Pulmonary Rehab from 02/27/2024 in [facility] Cardiac and Pulmonary Rehab  Picture Your Plate Total Score on Admission 39   Picture Your Plate Scores: <59 Unhealthy dietary pattern with much room for improvement. 41-50 Dietary pattern unlikely to meet recommendations for good health and room for improvement. 51-60 More healthful dietary pattern, with some room for improvement.  >60 Healthy dietary pattern, although there may be some specific behaviors that could be improved.   Nutrition Goals Re-Evaluation:  Nutrition Goals Re-Evaluation     Row Name 03/26/24 1402 04/23/24 1546 05/21/24 1617         Goals   Comment Erin Contreras is doing better with nutrition. She is cutting back on sugar. Asked about several foods and how best to use them. Suggested a few meal/snack ideas to help her stick with impactful and healthy food choices. Erin Contreras reports her nutrition has been up and down as she does better with her diet on some days more than others. She has been working on cutting out sodas and limiting excess bread, as well as junk food. She is still eating out on weekends which she states has not been good for her nutrition. She has been drinking plenty of water. Erin Contreras is still working on improving her diet. She has been able to limit the amount of times she goes out to eat, and her soda intake, but states she doesnt feel like shes eating well at home.     Expected Outcome STG: cut back on excessive sugar. LTG: Follow a healthy diet that supports a healthy weight Short: Continue to limit junk food. Long: Continue to  practice healthy eating patterns discussed with RD. Short: Continue to limit junk food. Long: Continue to practice healthy eating patterns discussed with RD.        Nutrition Goals Discharge (Final Nutrition Goals Re-Evaluation):  Nutrition Goals Re-Evaluation - 05/21/24 1617       Goals   Comment Erin Contreras is still working on improving her diet. She has been able to limit the amount of times she goes out to eat, and her soda intake, but states she doesnt feel like shes eating well at home.    Expected Outcome Short: Continue to limit junk food. Long: Continue to practice healthy eating patterns discussed with RD.          Psychosocial: Target Goals: Acknowledge presence or absence of significant depression and/or stress, maximize coping skills, provide positive support system. Participant is able to verbalize types and ability to use techniques and skills needed for reducing stress and  depression.   Education: Stress, Anxiety, and Depression - Group verbal and visual presentation to define topics covered.  Reviews how body is impacted by stress, anxiety, and depression.  Also discusses healthy ways to reduce stress and to treat/manage anxiety and depression.  Written material provided at class time.   Education: Sleep Hygiene -Provides group verbal and written instruction about how sleep can affect your health.  Define sleep hygiene, discuss sleep cycles and impact of sleep habits. Review good sleep hygiene tips.    Initial Review & Psychosocial Screening:  Initial Psych Review & Screening - 02/13/24 1350       Initial Review   Current issues with History of Depression;Current Stress Concerns    Source of Stress Concerns Chronic Illness    Comments --      Family Dynamics   Good Support System? Yes      Barriers   Psychosocial barriers to participate in program There are no identifiable barriers or psychosocial needs.;The patient should benefit from training in stress management  and relaxation.      Screening Interventions   Interventions Encouraged to exercise    Expected Outcomes Short Term goal: Identification and review with participant of any Quality of Life or Depression concerns found by scoring the questionnaire.;Long Term goal: The participant improves quality of Life and PHQ9 Scores as seen by post scores and/or verbalization of changes          Quality of Life Scores:  Scores of 19 and below usually indicate a poorer quality of life in these areas.  A difference of  2-3 points is a clinically meaningful difference.  A difference of 2-3 points in the total score of the Quality of Life Index has been associated with significant improvement in overall quality of life, self-image, physical symptoms, and general health in studies assessing change in quality of life.  PHQ-9: Review Flowsheet  More data exists      03/26/2024 02/16/2024 01/30/2024 12/06/2023 11/01/2023  Depression screen PHQ 2/9  Decreased Interest 1 1 0 0 1  Down, Depressed, Hopeless 0 0 0 0 0  PHQ - 2 Score 1 1 0 0 1  Altered sleeping 1 2 2 3  -  Tired, decreased energy 2 3 3 2  -  Change in appetite 0 0 0 0 -  Feeling bad or failure about yourself  0 0 0 0 -  Trouble concentrating 0 0 0 0 -  Moving slowly or fidgety/restless 0 0 1 0 -  Suicidal thoughts 0 0 0 0 -  PHQ-9 Score 4 6 6 5  -  Difficult doing work/chores Not difficult at all Not difficult at all Not difficult at all Not difficult at all -   Interpretation of Total Score  Total Score Depression Severity:  1-4 = Minimal depression, 5-9 = Mild depression, 10-14 = Moderate depression, 15-19 = Moderately severe depression, 20-27 = Severe depression   Psychosocial Evaluation and Intervention:  Psychosocial Evaluation - 02/13/24 1400       Psychosocial Evaluation & Interventions   Interventions Encouraged to exercise with the program and follow exercise prescription    Comments Patient stated that she has had occasional periods of  depression, however does not take medication specifically for that. Although, patient is prescribed an anti-depressant that is also used to help manage chronic pain. Patient stated she does have some stress occasionally related to regular everyday life occurrences, but nothing specific and not all of the time. Patient stated she has a good  support system of family and friends. Patient is interested in improving her shortness of breath and learning ways to improve her health and weight through lifestyle changes such as diet and exercise.    Expected Outcomes Short Term: Attend pulmonary rehab for exercise and education. Long Term: Develop and maintain positive healthcare habits.    Continue Psychosocial Services  Follow up required by staff          Psychosocial Re-Evaluation:  Psychosocial Re-Evaluation     Row Name 03/26/24 1400 04/23/24 1542 05/21/24 1615         Psychosocial Re-Evaluation   Current issues with Current Sleep Concerns Current Sleep Concerns;Current Stress Concerns Current Sleep Concerns;Current Stress Concerns     Comments Arriyah reports her sleep has been improving, currently getting ~5-6hr of sleep per night. She has been focusing on sleeping, going to bed earlier Erin Contreras reports no major stressors at this time. She also reports that exercise has been a good stress reliever for her. She also states that she is still struggling with sleep, as she is tired during the day bit cannot sleep at night. She is going for a sleep study tonight to gain information to help her improve her sleep quality. Erin Contreras is doing well but reports some stress concerns due to her health, and kids. She states that she still struggles with sleep and is tired most days. She plans to see her doctor later this month to try to get another sleep study done.     Expected Outcomes STG: continue to attend rehab and focus on good sleep. LTG: Achieve and maintain positive outlook on health and daily life Short:  Continue to exercise for stress relief. Long: Maintain positive outlook. Short: Continue to exercise for stress relief, complete a sleep study. Long: Continue to attend program to maintain a positive outlook.     Interventions Encouraged to attend Pulmonary Rehabilitation for the exercise Encouraged to attend Pulmonary Rehabilitation for the exercise Encouraged to attend Pulmonary Rehabilitation for the exercise     Continue Psychosocial Services  Follow up required by staff Follow up required by staff Follow up required by staff        Psychosocial Discharge (Final Psychosocial Re-Evaluation):  Psychosocial Re-Evaluation - 05/21/24 1615       Psychosocial Re-Evaluation   Current issues with Current Sleep Concerns;Current Stress Concerns    Comments Erin Contreras is doing well but reports some stress concerns due to her health, and kids. She states that she still struggles with sleep and is tired most days. She plans to see her doctor later this month to try to get another sleep study done.    Expected Outcomes Short: Continue to exercise for stress relief, complete a sleep study. Long: Continue to attend program to maintain a positive outlook.    Interventions Encouraged to attend Pulmonary Rehabilitation for the exercise    Continue Psychosocial Services  Follow up required by staff          Education: Education Goals: Education classes will be provided on a weekly basis, covering required topics. Participant will state understanding/return demonstration of topics presented.  Learning Barriers/Preferences:  Learning Barriers/Preferences - 02/13/24 1349       Learning Barriers/Preferences   Learning Barriers None    Learning Preferences Video;Verbal Instruction;Individual Instruction;Group Instruction;Computer/Internet;Audio;Written Material          General Pulmonary Education Topics:  Infection Prevention: - Provides verbal and written material to individual with discussion of  infection control including proper hand washing  and proper equipment cleaning during exercise session. Flowsheet Row Pulmonary Rehab from 02/16/2024 in [facility] Cardiac and Pulmonary Rehab  Date 02/16/24  Educator MB  Instruction Review Code 1- Verbalizes Understanding    Falls Prevention: - Provides verbal and written material to individual with discussion of falls prevention and safety. Flowsheet Row Pulmonary Rehab from 02/16/2024 in [facility] Cardiac and Pulmonary Rehab  Date 02/16/24  Educator MB  Instruction Review Code 1- Verbalizes Understanding    Chronic Lung Disease Review: - Group verbal instruction with posters, models, PowerPoint presentations and videos,  to review new updates, new respiratory medications, new advancements in procedures and treatments. Providing information on websites and 800 numbers for continued self-education. Includes information about supplement oxygen, available portable oxygen systems, continuous and intermittent flow rates, oxygen safety, concentrators, and Medicare reimbursement for oxygen. Explanation of Pulmonary Drugs, including class, frequency, complications, importance of spacers, rinsing mouth after steroid MDI's, and proper cleaning methods for nebulizers. Review of basic lung anatomy and physiology related to function, structure, and complications of lung disease. Review of risk factors. Discussion about methods for diagnosing sleep apnea and types of masks and machines for OSA. Includes a review of the use of types of environmental controls: home humidity, furnaces, filters, dust mite/pet prevention, HEPA vacuums. Discussion about weather changes, air quality and the benefits of nasal washing. Instruction on Warning signs, infection symptoms, calling MD promptly, preventive modes, and value of vaccinations. Review of effective airway clearance, coughing and/or vibration techniques. Emphasizing that all should Create an Action Plan. Written material provided  at class time. Flowsheet Row Pulmonary Rehab from 05/16/2024 in [facility] Cardiac and Pulmonary Rehab  Education need identified 02/27/24  Date 04/18/24  Educator jh  Instruction Review Code 1- Verbalizes Understanding    AED/CPR: - Group verbal and written instruction with the use of models to demonstrate the basic use of the AED with the basic ABC's of resuscitation.    Tests and Procedures:  - Group verbal and visual presentation and models provide information about basic cardiac anatomy and function. Reviews the testing methods done to diagnose heart disease and the outcomes of the test results. Describes the treatment choices: Medical Management, Angioplasty, or Coronary Bypass Surgery for treating various heart conditions including Myocardial Infarction, Angina, Valve Disease, and Cardiac Arrhythmias.  Written material provided at class time.   Medication Safety: - Group verbal and visual instruction to review commonly prescribed medications for heart and lung disease. Reviews the medication, class of the drug, and side effects. Includes the steps to properly store meds and maintain the prescription regimen.  Written material given at graduation.   Other: -Provides group and verbal instruction on various topics (see comments)   Knowledge Questionnaire Score:  Knowledge Questionnaire Score - 02/27/24 1701       Knowledge Questionnaire Score   Pre Score 14/18           Core Components/Risk Factors/Patient Goals at Admission:  Personal Goals and Risk Factors at Admission - 02/16/24 1026       Core Components/Risk Factors/Patient Goals on Admission    Weight Management Yes;Obesity;Weight Loss    Intervention Weight Management: Provide education and appropriate resources to help participant work on and attain dietary goals.;Weight Management/Obesity: Establish reasonable short term and long term weight goals.;Obesity: Provide education and appropriate resources to help  participant work on and attain dietary goals.;Weight Management: Develop a combined nutrition and exercise program designed to reach desired caloric intake, while maintaining appropriate intake of nutrient and fiber, sodium  and fats, and appropriate energy expenditure required for the weight goal.    Admit Weight 308 lb 12.8 oz (140.1 kg)    Goal Weight: Short Term 231 lb 12.8 oz (105.1 kg)    Goal Weight: Long Term 154 lb 6.4 oz (70 kg)    Expected Outcomes Short Term: Continue to assess and modify interventions until short term weight is achieved;Long Term: Adherence to nutrition and physical activity/exercise program aimed toward attainment of established weight goal;Weight Loss: Understanding of general recommendations for a balanced deficit meal plan, which promotes 1-2 lb weight loss per week and includes a negative energy balance of [phone] kcal/d;Understanding recommendations for meals to include 15-35% energy as protein, 25-35% energy from fat, 35-60% energy from carbohydrates, less than 200mg  of dietary cholesterol, 20-35 gm of total fiber daily;Understanding of distribution of calorie intake throughout the day with the consumption of 4-5 meals/snacks    Improve shortness of breath with ADL's Yes    Intervention Provide education, individualized exercise plan and daily activity instruction to help decrease symptoms of SOB with activities of daily living.    Expected Outcomes Short Term: Improve cardiorespiratory fitness to achieve a reduction of symptoms when performing ADLs;Long Term: Be able to perform more ADLs without symptoms or delay the onset of symptoms    Hypertension Yes    Intervention Provide education on lifestyle modifcations including regular physical activity/exercise, weight management, moderate sodium restriction and increased consumption of fresh fruit, vegetables, and low fat dairy, alcohol moderation, and smoking cessation.    Expected Outcomes Short Term: Continued  assessment and intervention until BP is < 140/44mm HG in hypertensive participants. < 130/72mm HG in hypertensive participants with diabetes, heart failure or chronic kidney disease.;Long Term: Maintenance of blood pressure at goal levels.    Lipids Yes    Intervention Provide education and support for participant on nutrition & aerobic/resistive exercise along with prescribed medications to achieve LDL 70mg , HDL >40mg .    Expected Outcomes Short Term: Participant states understanding of desired cholesterol values and is compliant with medications prescribed. Participant is following exercise prescription and nutrition guidelines.;Long Term: Cholesterol controlled with medications as prescribed, with individualized exercise RX and with personalized nutrition plan. Value goals: LDL < 70mg , HDL > 40 mg.          Education:Diabetes - Individual verbal and written instruction to review signs/symptoms of diabetes, desired ranges of glucose level fasting, after meals and with exercise. Acknowledge that pre and post exercise glucose checks will be done for 3 sessions at entry of program.   Know Your Numbers and Heart Failure: - Group verbal and visual instruction to discuss disease risk factors for cardiac and pulmonary disease and treatment options.  Reviews associated critical values for Overweight/Obesity, Hypertension, Cholesterol, and Diabetes.  Discusses basics of heart failure: signs/symptoms and treatments.  Introduces Heart Failure Zone chart for action plan for heart failure. Written material provided at class time.   Core Components/Risk Factors/Patient Goals Review:   Goals and Risk Factor Review     Row Name 03/26/24 1404 04/23/24 1549 05/21/24 1619         Core Components/Risk Factors/Patient Goals Review   Personal Goals Review Improve shortness of breath with ADL's Weight Management/Obesity;Hypertension Weight Management/Obesity;Hypertension     Review Erin Contreras reports that she  still has some shortness of breath during ADLs. Says has noticed some improvements since starting rehab. Erin Contreras states that she would like to lose weight, with a long term weight goal of 160 lb. She  has been working on weight loss through diet and exercise. She enjoys her Zumba dance class on days away from rehab. She stated that she does not own a personal BP cuff to monitor her BP cuff at home. She plans to get her daughter's help to obtain a personal BP cuff. Keiera is still trying to lose weight and her target weight continues to be around 160lbs. We have discussed setting a month goal of 5lbs. She does not check her BP regularly at home and we discussed the importance of checking it/obtaining a BP cuff in order to check it.     Expected Outcomes STG: Continue to attend rehab and use PLB during exertion. LTG: Manage risk factors independently Short: Continue to work on weight loss. Long: Continue to manage lifestyle risk factors. Short: Continue to work on weight loss, lose 5lbs in a month . Long: Continue to manage lifestyle risk factors.        Core Components/Risk Factors/Patient Goals at Discharge (Final Review):   Goals and Risk Factor Review - 05/21/24 1619       Core Components/Risk Factors/Patient Goals Review   Personal Goals Review Weight Management/Obesity;Hypertension    Review Erin Contreras is still trying to lose weight and her target weight continues to be around 160lbs. We have discussed setting a month goal of 5lbs. She does not check her BP regularly at home and we discussed the importance of checking it/obtaining a BP cuff in order to check it.    Expected Outcomes Short: Continue to work on weight loss, lose 5lbs in a month . Long: Continue to manage lifestyle risk factors.          ITP Comments:  ITP Comments     Row Name 02/13/24 1402 02/16/24 1014 02/27/24 1412 02/29/24 0831 03/28/24 0911   ITP Comments Initial phone call completed. Diagnosis can be found in [facility] 01/20/2024.  EP Orientation scheduled for February 16, 2024 @ 0800. Completed and gym orientation for respiratory care services. Initial ITP created and sent for review to Dr. Faud Aleskerov, Medical Director. First full day of exercise!  Patient was oriented to gym and equipment including functions, settings, policies, and procedures.  Patient's individual exercise prescription and treatment plan were reviewed.  All starting workloads were established based on the results of the 6 minute walk test done at initial orientation visit.  The plan for exercise progression was also introduced and progression will be customized based on patient's performance and goals. 30 Day review completed. Medical Director ITP review done, changes made as directed, and signed approval by Medical Director. new to program. 30 Day review completed. Medical Director ITP review done; changes made as directed and signed approval by Medical Director.    Row Name 04/25/24 1151 05/23/24 0954         ITP Comments 30 Day review completed. Medical Director ITP review done; changes made as directed and signed approval by Medical Director. 30 Day review completed. Medical Director ITP review done; changes made as directed and signed approval by Medical Director.         Comments: 30 day review

## 2024-05-23 NOTE — Progress Notes (Signed)
 Daily Session Note  Patient Details  Name: Erin Contreras MRN: [id number] Date of Birth: 1961/03/30 Referring Provider:   Flowsheet Row Pulmonary Rehab from 02/16/2024 in [facility] Cardiac and Pulmonary Rehab  Referring Provider Brena Reusing, MD    Encounter Date: 05/23/2024  Check In:  Session Check In - 05/23/24 1355       Check-In   Supervising physician immediately available to respond to emergencies See telemetry face sheet for immediately available ER MD    Location [facility]-Cardiac & Pulmonary Rehab    Staff Present Burnard Davenport RN,[name],MPA;Laura Cates RN,[name];Azyriah Nevins Dyane BS, [name], [name];[name], BS, [name]    Virtual Visit No    Medication changes reported     No    Fall or balance concerns reported    No    Warm-up and Cool-down Performed on first and last piece of equipment    Resistance Training Performed Yes    VAD Patient? No    PAD/SET Patient? No      Pain Assessment   Currently in Pain? No/denies             Social History   Tobacco Use  Smoking Status Never  Smokeless Tobacco Never    Goals Met:  Proper associated with RPD/PD & O2 Sat Independence with exercise equipment Using PLB without cueing & demonstrates good technique Exercise tolerated well No report of concerns or symptoms today Strength training completed today  Goals Unmet:  Not Applicable  Comments: Pt able to follow exercise prescription today without complaint.  Will continue to monitor for progression.    Dr. Oneil Pinal is Medical Director for [facility] Cardiac Rehabilitation.  Dr. Fuad Aleskerov is Medical Director for [facility] Pulmonary Rehabilitation.

## 2024-05-28 ENCOUNTER — Encounter: Admitting: Emergency Medicine

## 2024-05-28 DIAGNOSIS — R0602 Shortness of breath: Secondary | ICD-10-CM | POA: Diagnosis not present

## 2024-05-28 DIAGNOSIS — J849 Interstitial pulmonary disease, unspecified: Secondary | ICD-10-CM

## 2024-05-28 NOTE — Progress Notes (Signed)
 Daily Session Note  Patient Details  Name: Erin Contreras MRN: [id number] Date of Birth: [birth year] Referring Provider:   Flowsheet Row Pulmonary Rehab from 02/16/2024 in [facility] Cardiac and Pulmonary Rehab  Referring Provider Brena Reusing, MD    Encounter Date: 05/28/2024  Check In:  Session Check In - 05/28/24 1346       Check-In   Supervising physician immediately available to respond to emergencies See telemetry face sheet for immediately available ER MD    Location [facility]-Cardiac & Pulmonary Rehab    Staff Present Devaughn Jaeger, BS, [name];Noam Franzen RN,[name];Maxon Conetta BS, [name];[name], MS, [name]    Virtual Visit No    Medication changes reported     No    Fall or balance concerns reported    No    Warm-up and Cool-down Performed on first and last piece of equipment    Resistance Training Performed Yes    VAD Patient? No    PAD/SET Patient? No      Pain Assessment   Currently in Pain? No/denies             Social History   Tobacco Use  Smoking Status Never  Smokeless Tobacco Never    Goals Met:  Proper associated with RPD/PD & O2 Sat Independence with exercise equipment Using PLB without cueing & demonstrates good technique Exercise tolerated well No report of concerns or symptoms today Strength training completed today  Goals Unmet:  Not Applicable  Comments: Pt able to follow exercise prescription today without complaint.  Will continue to monitor for progression.    Dr. Oneil Pinal is Medical Director for [facility] Cardiac Rehabilitation.  Dr. Fuad Aleskerov is Medical Director for [facility] Pulmonary Rehabilitation.

## 2024-05-29 DIAGNOSIS — Z79899 Other long term (current) drug therapy: Secondary | ICD-10-CM | POA: Diagnosis not present

## 2024-05-29 DIAGNOSIS — M19041 Primary osteoarthritis, right hand: Secondary | ICD-10-CM | POA: Diagnosis not present

## 2024-05-29 DIAGNOSIS — Z23 Encounter for immunization: Secondary | ICD-10-CM | POA: Diagnosis not present

## 2024-05-29 DIAGNOSIS — Z5181 Encounter for therapeutic drug level monitoring: Secondary | ICD-10-CM | POA: Diagnosis not present

## 2024-05-29 DIAGNOSIS — M19042 Primary osteoarthritis, left hand: Secondary | ICD-10-CM | POA: Diagnosis not present

## 2024-05-29 DIAGNOSIS — J849 Interstitial pulmonary disease, unspecified: Secondary | ICD-10-CM | POA: Diagnosis not present

## 2024-05-29 DIAGNOSIS — M059 Rheumatoid arthritis with rheumatoid factor, unspecified: Secondary | ICD-10-CM | POA: Diagnosis not present

## 2024-05-30 ENCOUNTER — Encounter

## 2024-05-30 DIAGNOSIS — R0602 Shortness of breath: Secondary | ICD-10-CM | POA: Diagnosis not present

## 2024-05-30 DIAGNOSIS — J849 Interstitial pulmonary disease, unspecified: Secondary | ICD-10-CM

## 2024-05-30 NOTE — Progress Notes (Signed)
 Daily Session Note  Patient Details  Name: Erin Contreras MRN: [id number] Date of Birth: 01-15-1961 Referring Provider:   Flowsheet Row Pulmonary Rehab from 02/16/2024 in [facility] Cardiac and Pulmonary Rehab  Referring Provider Brena Reusing, MD    Encounter Date: 05/30/2024  Check In:  Session Check In - 05/30/24 1417       Check-In   Supervising physician immediately available to respond to emergencies See telemetry face sheet for immediately available ER MD    Location [facility]-Cardiac & Pulmonary Rehab    Staff Present Burnard Davenport RN,[name],MPA;Laura Cates RN,[name];Milledge Gerding Dyane BS, [name], [name];[name], BS, [name]    Virtual Visit No    Medication changes reported     No    Fall or balance concerns reported    No    Warm-up and Cool-down Performed on first and last piece of equipment    Resistance Training Performed Yes    VAD Patient? No    PAD/SET Patient? No      Pain Assessment   Currently in Pain? No/denies             Social History   Tobacco Use  Smoking Status Never  Smokeless Tobacco Never    Goals Met:  Proper associated with RPD/PD & O2 Sat Independence with exercise equipment Using PLB without cueing & demonstrates good technique Exercise tolerated well No report of concerns or symptoms today Strength training completed today  Goals Unmet:  Not Applicable  Comments: Pt able to follow exercise prescription today without complaint.  Will continue to monitor for progression.    Dr. Oneil Pinal is Medical Director for [facility] Cardiac Rehabilitation.  Dr. Fuad Aleskerov is Medical Director for [facility] Pulmonary Rehabilitation.

## 2024-06-04 ENCOUNTER — Encounter: Attending: Pulmonary Disease | Admitting: Emergency Medicine

## 2024-06-04 DIAGNOSIS — R0602 Shortness of breath: Secondary | ICD-10-CM | POA: Insufficient documentation

## 2024-06-04 DIAGNOSIS — J849 Interstitial pulmonary disease, unspecified: Secondary | ICD-10-CM | POA: Diagnosis present

## 2024-06-04 NOTE — Progress Notes (Signed)
 Daily Session Note  Patient Details  Name: Erin Contreras MRN: [id number] Date of Birth: June 07, 1961 Referring Provider:   Flowsheet Row Pulmonary Rehab from 02/16/2024 in [facility] Cardiac and Pulmonary Rehab  Referring Provider Brena Reusing, MD    Encounter Date: 06/04/2024  Check In:  Session Check In - 06/04/24 1358       Check-In   Supervising physician immediately available to respond to emergencies See telemetry face sheet for immediately available ER MD    Location [facility]-Cardiac & Pulmonary Rehab    Staff Present Devaughn Jaeger, BS, [name];[name], MS, [name];Jasime Westergren RN,[name];Maxon Conetta BS, [name]    Virtual Visit No    Medication changes reported     No    Fall or balance concerns reported    No    Warm-up and Cool-down Performed on first and last piece of equipment    Resistance Training Performed Yes    VAD Patient? No    PAD/SET Patient? No      Pain Assessment   Currently in Pain? No/denies             Social History   Tobacco Use  Smoking Status Never  Smokeless Tobacco Never    Goals Met:  Proper associated with RPD/PD & O2 Sat Independence with exercise equipment Using PLB without cueing & demonstrates good technique Exercise tolerated well No report of concerns or symptoms today Strength training completed today  Goals Unmet:  Not Applicable  Comments: Pt able to follow exercise prescription today without complaint.  Will continue to monitor for progression.    Dr. Oneil Pinal is Medical Director for [facility] Cardiac Rehabilitation.  Dr. Fuad Aleskerov is Medical Director for [facility] Pulmonary Rehabilitation.

## 2024-06-06 ENCOUNTER — Encounter: Admitting: Emergency Medicine

## 2024-06-06 DIAGNOSIS — R0602 Shortness of breath: Secondary | ICD-10-CM

## 2024-06-06 DIAGNOSIS — J849 Interstitial pulmonary disease, unspecified: Secondary | ICD-10-CM

## 2024-06-06 NOTE — Progress Notes (Signed)
 Daily Session Note  Patient Details  Name: Erin Contreras MRN: [id number] Date of Birth: 05/01/1961 Referring Provider:   Flowsheet Row Pulmonary Rehab from 02/16/2024 in [facility] Cardiac and Pulmonary Rehab  Referring Provider Brena Reusing, MD    Encounter Date: 06/06/2024  Check In:  Session Check In - 06/06/24 1357       Check-In   Supervising physician immediately available to respond to emergencies See telemetry face sheet for immediately available ER MD    Location [facility]-Cardiac & Pulmonary Rehab    Staff Present Leita Franks RN,[name];[name], BS, [name];[name], [name], [name];Jason Elnor RDN,LDN    Virtual Visit No    Medication changes reported     No    Fall or balance concerns reported    No    Warm-up and Cool-down Performed on first and last piece of equipment    Resistance Training Performed Yes    VAD Patient? No    PAD/SET Patient? No      Pain Assessment   Currently in Pain? No/denies             Social History   Tobacco Use  Smoking Status Never  Smokeless Tobacco Never    Goals Met:  Proper associated with RPD/PD & O2 Sat Independence with exercise equipment Using PLB without cueing & demonstrates good technique Exercise tolerated well No report of concerns or symptoms today Strength training completed today  Goals Unmet:  Not Applicable  Comments: Pt able to follow exercise prescription today without complaint.  Will continue to monitor for progression.    Dr. Oneil Pinal is Medical Director for [facility] Cardiac Rehabilitation.  Dr. Fuad Aleskerov is Medical Director for [facility] Pulmonary Rehabilitation.

## 2024-06-09 NOTE — Patient Instructions (Signed)
Rheumatoid Arthritis Rheumatoid arthritis (RA) is a long-term (chronic) disease. RA causes inflammation in your joints. Your joints may feel painful, stiff, swollen, and warm. RA may start slowly. It most often affects the small joints of the hands and feet. It can also affect other parts of the body. Symptoms of RA often come and go. There is no cure for RA, but medicines can help your symptoms. What are the causes? RA is an autoimmune disease. This means that your body's defense system (immune system) attacks healthy parts of your body by mistake. The exact cause of RA is not known. What increases the risk? Being female. Having a family history of RA or other diseases like RA. Smoking. Being very overweight (obese). Being exposed to pollutants or chemicals. What are the signs or symptoms? Symptoms start slowly. They are often worse in the morning. The first symptom is often morning stiffness that lasts longer than 30 minutes. As RA gets worse, symptoms may include: Pain, stiffness, swelling, warmth, and tenderness in joints on both sides of your body. Loss of energy. Not wanting to eat as much as normal. Weight loss. A low fever. Dry eyes and a dry mouth. Firm lumps that grow under your skin. Changes in the way your joints look or the way they work. Symptoms vary and they often come and go. Symptoms sometimes get worse for a period of time. These are called flares. How is this treated? Treatment may include: Taking good care of yourself. Be sure to rest as needed, eat a healthy diet, and exercise. Medicines. These may include: Pain relievers. Medicines to help with inflammation. Disease-modifying antirheumatic drugs (DMARDs). Medicines called biologic response modifiers. Physical therapy and occupational therapy. Surgery, if joint damage is very bad. Your doctor will work with you to find the best treatments. Follow these instructions at home: Managing pain, stiffness, and  swelling If told, put heat on the affected area. Do this as often as told by your doctor. Use the heat source that your doctor recommends, such as a moist heat pack or a heating pad. Place a towel between your skin and the heat source. Leave the heat on for 20-30 minutes. Take off the heat if your skin turns bright red. This is very important. If you cannot feel pain, heat, or cold, you have a greater risk of getting burned.  Activity Return to your normal activities when your doctor says that it is safe. Rest when you have a flare. Exercise as told by your doctor. This can help your joints move better and get stronger. General instructions Take over-the-counter and prescription medicines only as told by your doctor. Keep all follow-up visits. Where to find more information [employer] Rheumatology: rheumatology.org Arthritis Foundation: arthritis.org Contact a doctor if: You have a flare. You have a fever. You have problems because of your medicines. Get help right away if: You have chest pain. You have trouble breathing. You get a hot, painful joint all of a sudden, and it is worse than your normal joint aches. These symptoms may be an emergency. Get help right away. Call 911. Do not wait to see if the symptoms will go away. Do not drive yourself to the hospital. Summary RA is a long-term disease. RA causes inflammation in your joints. Symptoms of RA start slowly. They are often worse in the morning. This information is not intended to replace advice given to you by your health care provider. Make sure you discuss any questions you have with  your health care provider. Document Revised: 05/21/2021 Document Reviewed: 05/21/2021 Elsevier Patient Education  2024 [employer].

## 2024-06-11 ENCOUNTER — Ambulatory Visit (INDEPENDENT_AMBULATORY_CARE_PROVIDER_SITE_OTHER): Admitting: Nurse Practitioner

## 2024-06-11 ENCOUNTER — Encounter: Payer: Self-pay | Admitting: Nurse Practitioner

## 2024-06-11 ENCOUNTER — Encounter: Admitting: Emergency Medicine

## 2024-06-11 ENCOUNTER — Other Ambulatory Visit: Payer: Self-pay | Admitting: Nurse Practitioner

## 2024-06-11 VITALS — BP 110/75 | HR 81 | Temp 98.4°F | Resp 17 | Ht 69.08 in | Wt 307.4 lb

## 2024-06-11 DIAGNOSIS — Z6841 Body Mass Index (BMI) 40.0 and over, adult: Secondary | ICD-10-CM | POA: Diagnosis not present

## 2024-06-11 DIAGNOSIS — G4733 Obstructive sleep apnea (adult) (pediatric): Secondary | ICD-10-CM | POA: Diagnosis not present

## 2024-06-11 DIAGNOSIS — E7800 Pure hypercholesterolemia, unspecified: Secondary | ICD-10-CM

## 2024-06-11 DIAGNOSIS — R0602 Shortness of breath: Secondary | ICD-10-CM

## 2024-06-11 DIAGNOSIS — F321 Major depressive disorder, single episode, moderate: Secondary | ICD-10-CM

## 2024-06-11 DIAGNOSIS — E66813 Obesity, class 3: Secondary | ICD-10-CM

## 2024-06-11 DIAGNOSIS — G894 Chronic pain syndrome: Secondary | ICD-10-CM | POA: Diagnosis not present

## 2024-06-11 DIAGNOSIS — M0510 Rheumatoid lung disease with rheumatoid arthritis of unspecified site: Secondary | ICD-10-CM | POA: Diagnosis not present

## 2024-06-11 DIAGNOSIS — N3946 Mixed incontinence: Secondary | ICD-10-CM

## 2024-06-11 DIAGNOSIS — J849 Interstitial pulmonary disease, unspecified: Secondary | ICD-10-CM

## 2024-06-11 DIAGNOSIS — K2100 Gastro-esophageal reflux disease with esophagitis, without bleeding: Secondary | ICD-10-CM

## 2024-06-11 DIAGNOSIS — R7303 Prediabetes: Secondary | ICD-10-CM | POA: Diagnosis not present

## 2024-06-11 DIAGNOSIS — R350 Frequency of micturition: Secondary | ICD-10-CM | POA: Diagnosis not present

## 2024-06-11 DIAGNOSIS — I10 Essential (primary) hypertension: Secondary | ICD-10-CM

## 2024-06-11 LAB — URINALYSIS, ROUTINE W REFLEX MICROSCOPIC
Bilirubin, UA: NEGATIVE
Glucose, UA: NEGATIVE
Ketones, UA: NEGATIVE
Leukocytes,UA: NEGATIVE
Nitrite, UA: NEGATIVE
Protein,UA: NEGATIVE
RBC, UA: NEGATIVE
Specific Gravity, UA: 1.025 (ref 1.005–1.030)
Urobilinogen, Ur: 1 mg/dL (ref 0.2–1.0)
pH, UA: 6.5 (ref 5.0–7.5)

## 2024-06-11 LAB — WET PREP FOR TRICH, YEAST, CLUE
Clue Cell Exam: NEGATIVE
Trichomonas Exam: NEGATIVE
Yeast Exam: NEGATIVE

## 2024-06-11 LAB — BAYER DCA HB A1C WAIVED: HB A1C (BAYER DCA - WAIVED): 5.8 % — ABNORMAL HIGH (ref 4.8–5.6)

## 2024-06-11 MED ORDER — TIRZEPATIDE-WEIGHT MANAGEMENT 2.5 MG/0.5ML ~~LOC~~ SOLN: 2.5000 mg | SUBCUTANEOUS | 1 refills | Status: DC

## 2024-06-11 MED ORDER — FAMOTIDINE 40 MG PO TABS: 40.0000 mg | ORAL_TABLET | Freq: Every evening | ORAL | 3 refills | Status: AC | PRN

## 2024-06-11 MED ORDER — FAMOTIDINE 40 MG PO TABS: 40.0000 mg | ORAL_TABLET | Freq: Every day | ORAL | 3 refills | Status: DC

## 2024-06-11 MED ORDER — CHOLECALCIFEROL 1.25 MG (50000 UT) PO TABS: 1.0000 | ORAL_TABLET | ORAL | 4 refills | Status: AC

## 2024-06-11 MED ORDER — ALBUTEROL SULFATE HFA 108 (90 BASE) MCG/ACT IN AERS: 2.0000 | INHALATION_SPRAY | Freq: Four times a day (QID) | RESPIRATORY_TRACT | 2 refills | Status: AC | PRN

## 2024-06-11 MED ORDER — PANTOPRAZOLE SODIUM 40 MG PO TBEC: 40.0000 mg | DELAYED_RELEASE_TABLET | Freq: Two times a day (BID) | ORAL | 3 refills | Status: AC

## 2024-06-11 NOTE — Assessment & Plan Note (Signed)
Chronic, ongoing, followed by rheumatology and pain clinic.  Continue these collaborations, recent notes reviewed.  Labs today: CMP.

## 2024-06-11 NOTE — Assessment & Plan Note (Signed)
 Chronic, stable without medication.  Recommend she monitor BP at least a few mornings a week at home and document.  DASH diet at home.  Continue current diet focus.  Labs today: CMP.  Urine ALB 80 May 2025, may benefit an ACE or ARB added on in future.

## 2024-06-11 NOTE — Assessment & Plan Note (Signed)
 A1c 5.8% today, remaining similar to previous.  Continue diet monitoring and initiate medication as needed if A1c 6.5% or greater.

## 2024-06-11 NOTE — Assessment & Plan Note (Signed)
Chronic, ongoing, followed by pain management.  Continue this collaboration and current treatment regimen as ordered by them.  Recent notes reviewed.

## 2024-06-11 NOTE — Progress Notes (Signed)
 BP 110/75 (BP Location: Left Arm, Patient Position: Sitting, Cuff Size: Large)   Pulse 81   Temp 98.4 F (36.9 C) (Oral)   Resp 17   Ht 5' 9.08 (1.755 m)   Wt (!) 307 lb 6.4 oz (139.4 kg)   SpO2 94%   BMI 45.30 kg/m    Subjective:    Patient ID: Erin Contreras, female    DOB: [birth year], 63 y.o.   MRN: [id number]  HPI: Erin Contreras is a 63 y.o. female  Chief Complaint  Patient presents with   Rheumatoid Arthritis   HTN/HLD    Wakes with a migraine in the morning and feeling nauseous but eases up after she lays down again.     Impaired fasting glucose   Mood    Thinks its been alright.    Urinary Frequency    Says over the last month waking 3-4 times a night to use restroom.    CPAP use    Has been on it for 2-3 weeks but has been coughing a lot since she started using the machine. Not just while using. Does not feel as tired and has some more energy.    RHEUMATOID ARTHRITIS AND CHRONIC PAIN Following with [facility] rheumatology, taking Methotrexate , last visit was 05/29/24. Continues Actemra and Plaquenil + takes Meloxicam  as needed for pain. Takes B12 and vitamin D  daily for low levels. Saw pain clinic last for chronic pain on 04/26/24. Is taking Lyrica  + takes Duloxetine  which is for depression and pain. Is doing pulmonary rehab for rheumatoid disease of lung. Pain control status: stable Duration: years Location: back, arms, elbows, wrists, shoulders Quality: dull, aching, and throbbing Current Pain Level: 2/10 Previous Pain Level: 8/10 Breakthrough pain: no Benefit from narcotic medications: unknown What Activities task can be accomplished with current medication? Occasional ADLs Interested in weaning off narcotics:no   Stool softners/OTC fiber: no  Previous pain specialty evaluation: yes Non-narcotic analgesic meds: yes Narcotic contract: yes   SLEEP APNEA Started back to using CPAP 2-3 weeks ago.  Is not feeling as tired and has more energy. However, she is  reporting a cough that started after using this.  Her machine does have a humidifier. Attempted to get Wegovy  or Zepbound coverage in past to help with weight loss and her OSA, but this was denied. She has tried for over 6+ months to lose weight via diet and exercise, but has had no loss.  Sleep apnea status: uncontrolled Duration: chronic Satisfied with current treatment?:  no CPAP use:  no Treament compliance: good compliance Last sleep study: 04/23/24 Treatments attempted: CPAP Wakes feeling refreshed:  yes Daytime hypersomnolence:  no Fatigue:  yes Insomnia:  yes Good sleep hygiene:  yes Difficulty falling asleep:  yes Difficulty staying asleep:  no Snoring bothers bed partner:  yes Observed apnea by bed partner: unknown Obesity:  yes Hypertension: no  Pulmonary hypertension:  no Coronary artery disease:  no Clinical coverage for weight loss GLP's  Medication being dispensed is Zepbound 2 mL/28 days. Titration doses are 2 mL/28 days.   [x]  Product being prescribed is FDA approved for the indication, age, weight (if applicable) and not does not exceed dosing limits per the Prescribing Information per the clinical conditions for use.  [x]  Patient's baseline weight measured within the last 45 days as required by provider before dispensing.  [x]  Patient is new to therapy and One of the following:   [x]  The beneficiary is 64 years of age or over  and has ONE of the following:  [x]  A BMI greater than or equal to 30 kg/m2  []  A BMI greater than or equal to 27 kg/m2 with at least one weight-related comorbidity/risk factor/complication (i.e. hypertension, type 2 diabetes, obstructive sleep apnea, cardiovascular disease, dyslipidemia)  If patient has one weight-related comorbidity/risk factor/complication (i.e. hypertension, type 2 diabetes, obstructive sleep apnea, cardiovascular disease, dyslipidemia), please list OSA using CPAP, HTN, HLD. Patient suffers from weight-related  comorbidity/risk factor/complication OSA currently uses CPAP, Zepbound is FDA approved for patients with obesity and OSA.  [x]  The beneficiary has participated in 6 months or greater of lifestyle modification and will continue while on medication including structured nutrition following provider recommend dietary modifications and physical activity, unless physical activity is not clinically appropriate at the time GLP1 therapy commences AND  [x]  The beneficiary will NOT be using the requested agent in combination with another GLP-1 receptor agonist agent AND  [x]  The beneficiary does NOT have any FDA-labeled contraindications to the requested agent, including pregnancy, lactation, history of medullary thyroid  cancer or multiple endocrine neoplasia type II.  Last BMI/Weight/Height recorded Estimated body mass index is 45.3 kg/m as calculated from the following:   Height as of this encounter: 5' 9.08 (1.755 m).   Weight as of this encounter: 307 lb 6.4 oz (139.4 kg).  HYPERTENSION / HYPERLIPIDEMIA No current medications. Has been out of Protonix  for months and is noticing increased heart burn, was prescribed by GI. Duration of hypertension: chronic BP monitoring frequency: not checking BP range:  Duration of hyperlipidemia: chronic Cholesterol supplements: none Aspirin: no Recent stressors: no Recurrent headaches: no - as been having short morning headaches which started prior to CPAP Visual changes: no Palpitations: no Dyspnea: occasional at baseline Chest pain: no Lower extremity edema: no Dizzy/lightheaded: no  The 10-year ASCVD risk score (Arnett DK, et al., 2019) is: 5.5%   Values used to calculate the score:     Age: 17 years     Clincally relevant sex: Female     Is Non-Hispanic African American: Yes     Diabetic: No     Tobacco smoker: No     Systolic Blood Pressure: 110 mmHg     Is BP treated: No     HDL Cholesterol: 62 mg/dL     Total Cholesterol: 228  mg/dL  Impaired Fasting Glucose HbA1C:  Lab Results  Component Value Date   HGBA1C 5.8 (H) 12/06/2023  Duration of elevated blood sugar: chronic Polydipsia: no Polyuria: yes Weight change: no Visual disturbance: no Glucose Monitoring: no    Accucheck frequency: Not Checking    Fasting glucose:     Post prandial:  Diabetic Education: Not Completed Family history of diabetes: yes   URINARY SYMPTOMS Over the past month is waking up 3-4 times a night to urinate. Follows with GYN and last saw 02/17/24 due to recurrent vaginal infections. They have her using vaginal estrace. Dysuria: no Urinary frequency: yes Urgency: no Small volume voids: no Symptom severity: no Urinary incontinence: no Foul odor: no Hematuria: no Abdominal pain: no Back pain: no Suprapubic pain/pressure: no Flank pain: no Fever:  no Vomiting: recently due to being out of heart burn medication Status: stable Previous urinary tract infection: yes Recurrent urinary tract infection: no Sexual activity: No sexually active History of sexually transmitted disease: no Treatments attempted: increasing fluids       06/11/2024   10:00 AM 03/26/2024    1:56 PM 02/16/2024   10:28 AM 01/30/2024   10:32  AM 12/06/2023    9:09 AM  Depression screen PHQ 2/9  Decreased Interest 0 1 1 0 0  Down, Depressed, Hopeless 0 0 0 0 0  PHQ - 2 Score 0 1 1 0 0  Altered sleeping 3 1 2 2 3   Tired, decreased energy 3 2 3 3 2   Change in appetite 0 0 0 0 0  Feeling bad or failure about yourself  0 0 0 0 0  Trouble concentrating 0 0 0 0 0  Moving slowly or fidgety/restless 0 0 0 1 0  Suicidal thoughts 0 0 0 0 0  PHQ-9 Score 6 4  6  6  5    Difficult doing work/chores Not difficult at all Not difficult at all Not difficult at all Not difficult at all Not difficult at all     Data saved with a previous flowsheet row definition       06/11/2024   10:03 AM 01/30/2024   10:32 AM 12/06/2023    9:09 AM 07/28/2023    8:45 AM  GAD 7 :  Generalized Anxiety Score  Nervous, Anxious, on Edge 0 0 0 0  Control/stop worrying 2 1 0 0  Worry too much - different things 2 1 1  0  Trouble relaxing 0 0 1 0  Restless 0 0 0 0  Easily annoyed or irritable 0 0 0 0  Afraid - awful might happen 0 0 0 0  Total GAD 7 Score 4 2 2  0  Anxiety Difficulty Not difficult at all Not difficult at all Not difficult at all Not difficult at all   Relevant past medical, surgical, family and social history reviewed and updated as indicated. Interim medical history since our last visit reviewed. Allergies and medications reviewed and updated.  Review of Systems  Constitutional:  Negative for activity change, appetite change, diaphoresis, fatigue and fever.  Respiratory:  Positive for shortness of breath (more when active). Negative for cough, chest tightness and wheezing.   Cardiovascular:  Negative for chest pain, palpitations and leg swelling.  Gastrointestinal: Negative.   Endocrine: Positive for polyuria (just at night). Negative for cold intolerance, heat intolerance, polydipsia and polyphagia.  Genitourinary:  Positive for frequency (just at night). Negative for decreased urine volume, difficulty urinating, dysuria, flank pain, hematuria and urgency.  Neurological:  Positive for headaches (occasionally in morning, started prior to CPAP). Negative for dizziness, syncope, weakness, light-headedness and numbness.  Psychiatric/Behavioral: Negative.      Per HPI unless specifically indicated above     Objective:    BP 110/75 (BP Location: Left Arm, Patient Position: Sitting, Cuff Size: Large)   Pulse 81   Temp 98.4 F (36.9 C) (Oral)   Resp 17   Ht 5' 9.08 (1.755 m)   Wt (!) 307 lb 6.4 oz (139.4 kg)   SpO2 94%   BMI 45.30 kg/m   Wt Readings from Last 3 Encounters:  06/11/24 (!) 307 lb 6.4 oz (139.4 kg)  04/26/24 (!) 307 lb (139.3 kg)  03/08/24 (!) 309 lb 9.6 oz (140.4 kg)    Physical Exam Vitals and nursing note reviewed.   Constitutional:      General: She is awake. She is not in acute distress.    Appearance: She is well-developed and well-groomed. She is obese. She is not ill-appearing or toxic-appearing.  HENT:     Head: Normocephalic.     Right Ear: Hearing and external ear normal.     Left Ear: Hearing and external ear normal.  Eyes:     General: Lids are normal.        Right eye: No discharge.        Left eye: No discharge.     Conjunctiva/sclera: Conjunctivae normal.     Pupils: Pupils are equal, round, and reactive to light.  Neck:     Thyroid : No thyromegaly.     Vascular: No carotid bruit.  Cardiovascular:     Rate and Rhythm: Normal rate and regular rhythm.     Heart sounds: Normal heart sounds. No murmur heard.    No gallop.  Pulmonary:     Effort: Pulmonary effort is normal. No accessory muscle usage or respiratory distress.     Breath sounds: Normal breath sounds.  Abdominal:     General: Bowel sounds are normal. There is no distension.     Palpations: Abdomen is soft.     Tenderness: There is no abdominal tenderness. There is no right CVA tenderness or left CVA tenderness.  Musculoskeletal:     Cervical back: Normal range of motion and neck supple.     Right lower leg: No edema.     Left lower leg: No edema.  Lymphadenopathy:     Cervical: No cervical adenopathy.  Skin:    General: Skin is warm and dry.  Neurological:     Mental Status: She is alert and oriented to person, place, and time.     Deep Tendon Reflexes: Reflexes are normal and symmetric.     Reflex Scores:      Brachioradialis reflexes are 2+ on the right side and 2+ on the left side.      Patellar reflexes are 2+ on the right side and 2+ on the left side. Psychiatric:        Attention and Perception: Attention normal.        Mood and Affect: Mood normal.        Speech: Speech normal.        Behavior: Behavior normal. Behavior is cooperative.        Thought Content: Thought content normal.    Results for  orders placed or performed in visit on 02/17/24  Cervicovaginal ancillary only   Collection Time: 02/17/24  1:59 PM  Result Value Ref Range   Bacterial Vaginitis (gardnerella) Negative    Candida Vaginitis Positive (A)    Candida Glabrata Negative    Comment Normal Reference Range Candida Species - Negative    Comment Normal Reference Range Candida Galbrata - Negative    Comment      Normal Reference Range Bacterial Vaginosis - Negative      Assessment & Plan:   Problem List Items Addressed This Visit       Cardiovascular and Mediastinum   Benign hypertension (Chronic)   Chronic, stable without medication.  Recommend she monitor BP at least a few mornings a week at home and document.  DASH diet at home.  Continue current diet focus.  Labs today: CMP.  Urine ALB 80 May 2025, may benefit an ACE or ARB added on in future.        Respiratory   Obstructive sleep apnea of adult (Chronic)   Chronic, has been using CPAP for 2-3 weeks and noticing difference in energy level.  Suspect morning headaches due to poor control for long while and discussed with her these should improve as well, if not she is to alert provider. Recommend 10% use of CPAP. May try a daily Zyrtec or Claritin to help  with dry cough related to drainage.      Diffuse interstitial rheumatoid disease of lung (HCC) - Primary   Chronic, ongoing.  Followed by rheumatology and pulmonary.  Continue this collaboration.  Recent notes reviewed.        Digestive   Gastroesophageal reflux disease with esophagitis (Chronic)   Chronic, exacerbated due to being without medication.  Continue collaboration with GI as needed. Continue Protonix  as ordered by them in past, refills sent.  Risks of PPI use were discussed with patient including bone loss, C. Diff diarrhea, pneumonia, infections, CKD, electrolyte abnormalities.  Pt. Verbalizes understanding and chooses to continue the medication. Mag level annually.         Other    Prediabetes (Chronic)   A1c 5.8% today, remaining similar to previous.  Continue diet monitoring and initiate medication as needed if A1c 6.5% or greater.      Relevant Orders   Bayer DCA Hb A1c Waived   Urge and stress incontinence   Chronic, ongoing with symptoms more at night.  UA and wet prep negative today.  Suspect some pelvic floor dysfunction.  Would benefit from modest weight loss.  At this time recommend reduction in fluid intake 2-3 hours prior to bedtime.  Referral for pelvic floor therapy with PT in past, but she was unable to attend.  Will consider addition of medication in future, such as Ditropan or Myrbetriq.  Provided her with two pamphlets on pelvic floor dysfunction and exercises to perform at home.      Relevant Orders   WET PREP FOR TRICH, YEAST, CLUE   Urinalysis, Routine w reflex microscopic   Obesity   BMI 45.30 with underlying OSA, prediabetes, HTN/HLD, and RA.  Would benefit modest weight loss which we discussed today.  No family history of thyroid  cancer (MTC, MEN 2, thyroid  cell tumors) or pancreatitis.   We will work on getting Zepbound coverage as it is FDA approved for OSA. She has tried for 6+ months to lose weight on her own with no success via diet changes and exercise.  Recommended eating smaller high protein, low fat meals more frequently and exercising 30 mins a day 5 times a week with a goal of 10-15lb weight loss in the next 3 months. Patient voiced their understanding and motivation to adhere to these recommendations.       Relevant Medications   tirzepatide (ZEPBOUND) 2.5 MG/0.5ML injection vial   Elevated low density lipoprotein (LDL) cholesterol level   Chronic, ongoing -- noted on labs, ASCVD 5.5% and last LDL <190.  Continue diet focus at home. Lipid panel today.  Initiate medication as needed.      Relevant Orders   Comprehensive metabolic panel with GFR   Lipid Panel w/o Chol/HDL Ratio   Depression, major, single episode, moderate (HCC)    Ongoing, continue Duloxetine  which benefits both mood and pain.  Denies SI/HI.  Discussed addition of therapy or Wellbutrin, at this time wishes to continue current regimen and will adjust as needed.        Chronic pain syndrome   Chronic, ongoing, followed by pain management.  Continue this collaboration and current treatment regimen as ordered by them.  Recent notes reviewed.          Follow up plan: Return in about 3 months (around 09/11/2024) for RA, OSA, HTN/HLD.

## 2024-06-11 NOTE — Assessment & Plan Note (Signed)
 Chronic, exacerbated due to being without medication.  Continue collaboration with GI as needed. Continue Protonix  as ordered by them in past, refills sent.  Risks of PPI use were discussed with patient including bone loss, C. Diff diarrhea, pneumonia, infections, CKD, electrolyte abnormalities.  Pt. Verbalizes understanding and chooses to continue the medication. Mag level annually.

## 2024-06-11 NOTE — Assessment & Plan Note (Signed)
 Chronic, ongoing -- noted on labs, ASCVD 5.5% and last LDL <190.  Continue diet focus at home. Lipid panel today.  Initiate medication as needed.

## 2024-06-11 NOTE — Assessment & Plan Note (Signed)
 Chronic, ongoing.  Followed by rheumatology and pulmonary.  Continue this collaboration.  Recent notes reviewed.

## 2024-06-11 NOTE — Progress Notes (Signed)
 Daily Session Note  Patient Details  Name: Erin Contreras MRN: [id number] Date of Birth: 1961/06/24 Referring Provider:   Flowsheet Row Pulmonary Rehab from 02/16/2024 in [facility] Cardiac and Pulmonary Rehab  Referring Provider Brena Reusing, MD    Encounter Date: 06/11/2024  Check In:  Session Check In - 06/11/24 1400       Check-In   Supervising physician immediately available to respond to emergencies See telemetry face sheet for immediately available ER MD    Location [facility]-Cardiac & Pulmonary Rehab    Staff Present Devaughn Jaeger, BS, [name];Rylynn Kobs RN,[name];Maxon Conetta BS, [name];[name], MS, [name]    Virtual Visit No    Medication changes reported     No    Fall or balance concerns reported    No    Warm-up and Cool-down Performed on first and last piece of equipment    Resistance Training Performed Yes    VAD Patient? No    PAD/SET Patient? No      Pain Assessment   Currently in Pain? No/denies             Social History   Tobacco Use  Smoking Status Never  Smokeless Tobacco Never    Goals Met:  Proper associated with RPD/PD & O2 Sat Independence with exercise equipment Using PLB without cueing & demonstrates good technique Exercise tolerated well No report of concerns or symptoms today Strength training completed today  Goals Unmet:  Not Applicable  Comments: Pt able to follow exercise prescription today without complaint.  Will continue to monitor for progression.    Dr. Oneil Pinal is Medical Director for [facility] Cardiac Rehabilitation.  Dr. Fuad Aleskerov is Medical Director for [facility] Pulmonary Rehabilitation.

## 2024-06-11 NOTE — Assessment & Plan Note (Signed)
 Chronic, ongoing with symptoms more at night.  UA and wet prep negative today.  Suspect some pelvic floor dysfunction.  Would benefit from modest weight loss.  At this time recommend reduction in fluid intake 2-3 hours prior to bedtime.  Referral for pelvic floor therapy with PT in past, but she was unable to attend.  Will consider addition of medication in future, such as Ditropan or Myrbetriq.  Provided her with two pamphlets on pelvic floor dysfunction and exercises to perform at home.

## 2024-06-11 NOTE — Assessment & Plan Note (Signed)
 BMI 45.30 with underlying OSA, prediabetes, HTN/HLD, and RA.  Would benefit modest weight loss which we discussed today.  No family history of thyroid  cancer (MTC, MEN 2, thyroid  cell tumors) or pancreatitis.   We will work on getting Zepbound coverage as it is FDA approved for OSA. She has tried for 6+ months to lose weight on her own with no success via diet changes and exercise.  Recommended eating smaller high protein, low fat meals more frequently and exercising 30 mins a day 5 times a week with a goal of 10-15lb weight loss in the next 3 months. Patient voiced their understanding and motivation to adhere to these recommendations.

## 2024-06-11 NOTE — Assessment & Plan Note (Signed)
Ongoing, continue Duloxetine which benefits both mood and pain.  Denies SI/HI.  Discussed addition of therapy or Wellbutrin, at this time wishes to continue current regimen and will adjust as needed.

## 2024-06-11 NOTE — Assessment & Plan Note (Signed)
 Chronic, has been using CPAP for 2-3 weeks and noticing difference in energy level.  Suspect morning headaches due to poor control for long while and discussed with her these should improve as well, if not she is to alert provider. Recommend 10% use of CPAP. May try a daily Zyrtec or Claritin to help with dry cough related to drainage.

## 2024-06-12 ENCOUNTER — Ambulatory Visit: Payer: Self-pay | Admitting: Nurse Practitioner

## 2024-06-12 LAB — LIPID PANEL W/O CHOL/HDL RATIO
Cholesterol, Total: 230 mg/dL — ABNORMAL HIGH (ref 100–199)
HDL: 66 mg/dL (ref >39)
LDL Chol Calc (NIH): 135 mg/dL — ABNORMAL HIGH (ref 0–99)
Triglycerides: 163 mg/dL — ABNORMAL HIGH (ref 0–149)
VLDL Cholesterol Cal: 29 mg/dL (ref 5–40)

## 2024-06-12 LAB — COMPREHENSIVE METABOLIC PANEL WITH GFR
ALT: 23 IU/L (ref 0–32)
AST: 19 IU/L (ref 0–40)
Albumin: 4 g/dL (ref 3.9–4.9)
Alkaline Phosphatase: 88 IU/L (ref 49–135)
BUN/Creatinine Ratio: 15 (ref 12–28)
BUN: 11 mg/dL (ref 8–27)
Bilirubin Total: 0.6 mg/dL (ref 0.0–1.2)
CO2: 22 mmol/L (ref 20–29)
Calcium: 9.1 mg/dL (ref 8.7–10.3)
Chloride: 107 mmol/L — ABNORMAL HIGH (ref 96–106)
Creatinine, Ser: 0.75 mg/dL (ref 0.57–1.00)
Globulin, Total: 2.4 g/dL (ref 1.5–4.5)
Glucose: 101 mg/dL — ABNORMAL HIGH (ref 70–99)
Potassium: 4.2 mmol/L (ref 3.5–5.2)
Sodium: 142 mmol/L (ref 134–144)
Total Protein: 6.4 g/dL (ref 6.0–8.5)
eGFR: 89 mL/min/1.73 (ref >59)

## 2024-06-12 NOTE — Telephone Encounter (Signed)
 Requested medications are due for refill today.  No - see note from pharmacy  Requested medications are on the active medications list.  yes  Last refill. 06/11/2024 2mL 1 rf  Future visit scheduled.   yes  Notes to clinic.  Pharmacy comment: Alternative Requested:CANNOT ORDER VIALS, PLEASE RESEND FOR PENS.     Requested Prescriptions  Pending Prescriptions Disp Refills   ZEPBOUND 2.5 MG/0.5ML injection vial [Pharmacy Med Name: ZEPBOUND 2.5 MG/0.5 ML VIAL] 2 mL 1    Sig: INJECT 2.5 MG INTO THE SKIN ONCE A WEEK. DX CODE G47.33 AND E66.9     Off-Protocol Failed - 06/12/2024  5:26 PM      Failed - Medication not assigned to a protocol, review manually.      Passed - Valid encounter within last 12 months    Recent Outpatient Visits           Yesterday Diffuse interstitial rheumatoid disease of lung (HCC)   [place] [facility] [place], [place] T, NP   4 months ago Rheumatoid arthritis involving multiple sites with positive rheumatoid factor (HCC)   [place] [facility] [place], [place] T, NP   6 months ago Rheumatoid arthritis involving multiple sites with positive rheumatoid factor (HCC)   [place] [facility] [place], Melanie DASEN, NP

## 2024-06-12 NOTE — Progress Notes (Signed)
 Good afternoon, please let Breauna know her labs have returned and overall remain reassuring with exception of lipid panel which continues to show elevations in cholesterol. You do not need statin therapy at this time. My hope is we can get the Zepbound covered with your sleep apnea and this will help with overall health. Any questions? Keep being amazing!!  Thank you for allowing me to participate in your care.  I appreciate you. Kindest regards, Charli Halle

## 2024-06-13 ENCOUNTER — Other Ambulatory Visit: Payer: Self-pay | Admitting: Nurse Practitioner

## 2024-06-13 ENCOUNTER — Encounter

## 2024-06-13 DIAGNOSIS — R0602 Shortness of breath: Secondary | ICD-10-CM

## 2024-06-13 DIAGNOSIS — J849 Interstitial pulmonary disease, unspecified: Secondary | ICD-10-CM

## 2024-06-13 MED ORDER — TIRZEPATIDE-WEIGHT MANAGEMENT 2.5 MG/0.5ML ~~LOC~~ SOAJ: 2.5000 mg | SUBCUTANEOUS | 0 refills | Status: AC

## 2024-06-13 NOTE — Progress Notes (Signed)
 Daily Session Note  Patient Details  Name: Erin Contreras MRN: [id number] Date of Birth: 20-Oct-1960 Referring Provider:   Flowsheet Row Pulmonary Rehab from 02/16/2024 in [facility] Cardiac and Pulmonary Rehab  Referring Provider Brena Reusing, MD    Encounter Date: 06/13/2024  Check In:  Session Check In - 06/13/24 1343       Check-In   Supervising physician immediately available to respond to emergencies See telemetry face sheet for immediately available ER MD    Location [facility]-Cardiac & Pulmonary Rehab    Staff Present Burnard Davenport RN,[name],MPA;Laura Cates RN,[name];Onur Mori Dyane BS, [name], [name];[name], BS, [name]    Virtual Visit No    Medication changes reported     No    Fall or balance concerns reported    No    Warm-up and Cool-down Performed on first and last piece of equipment    Resistance Training Performed Yes    VAD Patient? No    PAD/SET Patient? No      Pain Assessment   Currently in Pain? No/denies             Social History   Tobacco Use  Smoking Status Never  Smokeless Tobacco Never    Goals Met:  Proper associated with RPD/PD & O2 Sat Independence with exercise equipment Using PLB without cueing & demonstrates good technique Exercise tolerated well No report of concerns or symptoms today Strength training completed today  Goals Unmet:  Not Applicable  Comments: Pt able to follow exercise prescription today without complaint.  Will continue to monitor for progression.    Dr. Oneil Pinal is Medical Director for [facility] Cardiac Rehabilitation.  Dr. Fuad Aleskerov is Medical Director for [facility] Pulmonary Rehabilitation.

## 2024-06-14 ENCOUNTER — Other Ambulatory Visit (HOSPITAL_COMMUNITY): Payer: Self-pay

## 2024-06-14 ENCOUNTER — Telehealth: Payer: Self-pay | Admitting: Pharmacy Technician

## 2024-06-14 NOTE — Telephone Encounter (Signed)
 Pharmacy Patient Advocate Encounter   Received notification from Onbase that prior authorization for Zepbound 2.5MG /0.5ML pen-injectors is required/requested.   Insurance verification completed.   The patient is insured through [facility] [employer].   Per test claim:

## 2024-06-18 ENCOUNTER — Encounter: Admitting: Emergency Medicine

## 2024-06-18 VITALS — Ht 69.5 in | Wt 306.9 lb

## 2024-06-18 DIAGNOSIS — R0602 Shortness of breath: Secondary | ICD-10-CM | POA: Diagnosis not present

## 2024-06-18 DIAGNOSIS — J849 Interstitial pulmonary disease, unspecified: Secondary | ICD-10-CM

## 2024-06-18 NOTE — Progress Notes (Signed)
 Daily Session Note  Patient Details  Name: Erin Contreras MRN: [id number] Date of Birth: 1960/10/27 Referring Provider:   Flowsheet Row Pulmonary Rehab from 02/16/2024 in [facility] Cardiac and Pulmonary Rehab  Referring Provider Brena Reusing, MD    Encounter Date: 06/18/2024  Check In:  Session Check In - 06/18/24 1350       Check-In   Supervising physician immediately available to respond to emergencies See telemetry face sheet for immediately available ER MD    Location [facility]-Cardiac & Pulmonary Rehab    Staff Present Leita Franks RN,[name];Meredith Tressa RN,[name];[name], MS, [name];[name], BS, [name]    Virtual Visit No    Medication changes reported     No    Fall or balance concerns reported    No    Warm-up and Cool-down Performed on first and last piece of equipment    Resistance Training Performed Yes    VAD Patient? No    PAD/SET Patient? No      Pain Assessment   Currently in Pain? No/denies             Social History   Tobacco Use  Smoking Status Never  Smokeless Tobacco Never    Goals Met:  Proper associated with RPD/PD & O2 Sat Independence with exercise equipment Using PLB without cueing & demonstrates good technique Exercise tolerated well No report of concerns or symptoms today Strength training completed today  Goals Unmet:  Not Applicable  Comments: Pt able to follow exercise prescription today without complaint.  Will continue to monitor for progression.    Dr. Oneil Pinal is Medical Director for [facility] Cardiac Rehabilitation.  Dr. Fuad Aleskerov is Medical Director for [facility] Pulmonary Rehabilitation.

## 2024-06-18 NOTE — Patient Instructions (Signed)
 Discharge Patient Instructions  Patient Details  Name: Erin Contreras MRN: [id number] Date of Birth: [birth year] Referring Provider:  [name], NP   Number of Visits: 66  Reason for Discharge:  Patient reached a stable level of exercise. Patient independent in their exercise. Patient has met program and personal goals.  Diagnosis:  Shortness of breath  Interstitial lung disease (HCC)  Initial Exercise Prescription:  Initial Exercise Prescription - 02/16/24 1000       Date of Initial Exercise RX and Referring Provider   Date 02/16/24    Referring Provider Brena Reusing, MD      Oxygen   Maintain Oxygen Saturation 88% or higher      Treadmill   MPH 1.8   Try   Grade 0    Minutes 15    METs 2.38      Recumbant Bike   Level 2    RPM 50    Watts 25    Minutes 15    METs 2.34      REL-XR   Level 1    Speed 50    Minutes 15    METs 2.34      T5 Nustep   Level 2    SPM 80    Minutes 15    METs 2.34      Track   Laps 25    Minutes 15    METs 2.36      Prescription Details   Frequency (times per week) 2    Duration Progress to 30 minutes of continuous aerobic without signs/symptoms of physical distress      Intensity   THRR 40-80% of Max Heartrate 109-140    Ratings of Perceived Exertion 11-13    Perceived Dyspnea 0-4      Progression   Progression Continue to progress workloads to maintain intensity without signs/symptoms of physical distress.      Resistance Training   Training Prescription Yes    Weight 4 lb    Reps 10-15          Discharge Exercise Prescription (Final Exercise Prescription Changes):  Exercise Prescription Changes - 06/06/24 1100       Response to Exercise   Blood Pressure (Admit) 110/70    Blood Pressure (Exit) 118/74    Heart Rate (Admit) 87 bpm    Heart Rate (Exercise) 111 bpm    Heart Rate (Exit) 88 bpm    Oxygen Saturation (Admit) 91 %    Oxygen Saturation (Exercise) 90 %    Oxygen Saturation  (Exit) 95 %    Rating of Perceived Exertion (Exercise) 13    Perceived Dyspnea (Exercise) 1    Symptoms none    Duration Continue with 30 min of aerobic exercise without signs/symptoms of physical distress.    Intensity THRR unchanged      Progression   Progression Continue to progress workloads to maintain intensity without signs/symptoms of physical distress.    Average METs 2.25      Resistance Training   Training Prescription Yes    Weight 4 lb    Reps 10-15      Interval Training   Interval Training No      Treadmill   MPH 1.5    Grade 0    Minutes 15    METs 2.15      REL-XR   Level 2    Minutes 15    METs 2.7      Home Exercise Plan  Plans to continue exercise at [facility] (comment)   Complete Fitness for [employer], aerobic exercise, and other group fitness classes, walking and biking at home, and sit down exercises   Frequency Add 2 additional days to program exercise sessions.    Initial Home Exercises Provided 04/16/24      Oxygen   Maintain Oxygen Saturation 88% or higher          Functional Capacity:  6 Minute Walk     Row Name 02/16/24 1015 06/18/24 1409       6 Minute Walk   Phase Initial Discharge    Distance 1100 feet 1295 feet    Distance % Change -- 17.73 %    Distance Feet Change -- 195 ft    Walk Time 6 minutes 6 minutes    # of Rest Breaks 0 0    MPH 2.08 2.45    METS 2.34 2.87    RPE 11 9    Perceived Dyspnea  0 0    VO2 Peak 8.19 10.06    Symptoms No No    Resting HR 78 bpm 96 bpm    Resting BP 126/80 118/80    Resting Oxygen Saturation  93 % 93 %    Exercise Oxygen Saturation  during 6 min walk 90 % 92 %    Max Ex. HR 125 bpm 133 bpm    Max Ex. BP 160/80 170/80    2 Minute Post BP 118/80 144/88      Interval HR   1 Minute HR 90 117    2 Minute HR 106 128    3 Minute HR 125 83    4 Minute HR 108 91    5 Minute HR 101 95    6 Minute HR 107 133    2 Minute Post HR 88 109    Interval Heart Rate? Yes Yes       Interval Oxygen   Interval Oxygen? Yes Yes    Baseline Oxygen Saturation % 93 % 93 %    1 Minute Oxygen Saturation % 93 % 94 %    1 Minute Liters of Oxygen 0 L 0 L    2 Minute Oxygen Saturation % 91 % 92 %    2 Minute Liters of Oxygen 0 L 0 L    3 Minute Oxygen Saturation % 91 % 94 %    3 Minute Liters of Oxygen 0 L 0 L    4 Minute Oxygen Saturation % 91 % 93 %    4 Minute Liters of Oxygen 0 L 0 L    5 Minute Oxygen Saturation % 90 % 93 %    5 Minute Liters of Oxygen 0 L 0 L    6 Minute Oxygen Saturation % 90 % 92 %    6 Minute Liters of Oxygen 0 L 0 L    2 Minute Post Oxygen Saturation % 94 % 95 %    2 Minute Post Liters of Oxygen 0 L 0 L      Nutrition & Weight - Outcomes:  Pre Biometrics - 02/16/24 1024       Pre Biometrics   Height 5' 9.5 (1.765 m)    Weight 308 lb 12.8 oz (140.1 kg)    Waist Circumference 53.9 inches    Hip Circumference 62 inches    Waist to Hip Ratio 0.87 %    BMI (Calculated) 44.96    Single Leg Stand 12.5 seconds  Post Biometrics - 06/18/24 1413        Post  Biometrics   Height 5' 9.5 (1.765 m)    Weight 306 lb 14.4 oz (139.2 kg)    Waist Circumference 52.3 inches    Hip Circumference 62 inches    Waist to Hip Ratio 0.84 %    BMI (Calculated) 44.69    Single Leg Stand 15 seconds          Nutrition:  Nutrition Therapy & Goals - 02/27/24 1534       Nutrition Therapy   Diet Cardiac, Low Na    Protein (specify units) 80g    Fiber 25 grams    Whole Grain Foods 3 servings    Saturated Fats 15 max. grams    Fruits and Vegetables 5 servings/day    Sodium 2 grams      Personal Nutrition Goals   Nutrition Goal Eat 3 times per day, small frequent meals or nutrient dense snacks    Personal Goal #2 Drink 48-64oz of non sugary beverages (not during meals)    Personal Goal #3 Get junk food out of house and build habits around not eating junk on daily basis.    Comments Babe has had bariatric surgery ~9years ago. She has  gained the weight back. She reports she struggles with consistency. Says some weeks she will make good food choices, but then the next week she makes poor choices. She reports she likes junk food and eats it often at night. Spoke with her about if it was a sweet craving or habitual. She felt it was habitual. Recommended she not buy any junk food and cut out sugary beverages. She drinks ~32-48oz of water. Suggested she aim for most of her fluids be water and if she wants sometime different it should be a not caloric beverage. She wants to find a weight loss plan that is sustainable and healthy. Spoke with her about foods and what a nutrition plan could look like. She is interested in making changes, RD will work on filling in knowledge gaps and checking in throughout rehab program to assess progress and consistency towards nutrition goals. Provided mediterranean diet handout, educated on types of fats, sources, and how to read labels. Brainstormed several protein carb paired meals and snacks with foods she likes and will eat.      Intervention Plan   Intervention Prescribe, educate and counsel regarding individualized specific dietary modifications aiming towards targeted core components such as weight, hypertension, lipid management, diabetes, heart failure and other comorbidities.;Nutrition handout(s) given to patient.    Expected Outcomes Long Term Goal: Adherence to prescribed nutrition plan.;Short Term Goal: A plan has been developed with personal nutrition goals set during dietitian appointment.;Short Term Goal: Understand basic principles of dietary content, such as calories, fat, sodium, cholesterol and nutrients.

## 2024-06-20 ENCOUNTER — Encounter: Payer: Self-pay | Admitting: *Deleted

## 2024-06-20 ENCOUNTER — Encounter: Admitting: Emergency Medicine

## 2024-06-20 DIAGNOSIS — R0602 Shortness of breath: Secondary | ICD-10-CM | POA: Diagnosis not present

## 2024-06-20 DIAGNOSIS — J849 Interstitial pulmonary disease, unspecified: Secondary | ICD-10-CM

## 2024-06-20 NOTE — Progress Notes (Signed)
 Daily Session Note  Patient Details  Name: Erin Contreras MRN: [id number] Date of Birth: Oct 23, 1960 Referring Provider:   Flowsheet Row Pulmonary Rehab from 02/16/2024 in [facility] Cardiac and Pulmonary Rehab  Referring Provider Brena Reusing, MD    Encounter Date: 06/20/2024  Check In:  Session Check In - 06/20/24 1415       Check-In   Supervising physician immediately available to respond to emergencies See telemetry face sheet for immediately available ER MD    Location [facility]-Cardiac & Pulmonary Rehab    Staff Present Leita Franks RN,[name];Joseph Rolinda RCP,RRT,BSRT;Kelly Bollinger RN,[name],MPA;Kelly Dyane BS, [name], [name]    Virtual Visit No    Medication changes reported     No    Fall or balance concerns reported    No    Warm-up and Cool-down Performed on first and last piece of equipment    Resistance Training Performed Yes    VAD Patient? No    PAD/SET Patient? No      Pain Assessment   Currently in Pain? No/denies             Social History   Tobacco Use  Smoking Status Never  Smokeless Tobacco Never    Goals Met:  Proper associated with RPD/PD & O2 Sat Independence with exercise equipment Using PLB without cueing & demonstrates good technique Exercise tolerated well No report of concerns or symptoms today Strength training completed today  Goals Unmet:  Not Applicable  Comments: Pt able to follow exercise prescription today without complaint.  Will continue to monitor for progression.    Dr. Oneil Pinal is Medical Director for [facility] Cardiac Rehabilitation.  Dr. Fuad Aleskerov is Medical Director for [facility] Pulmonary Rehabilitation.

## 2024-06-20 NOTE — Progress Notes (Signed)
 Pulmonary Individual Treatment Plan  Patient Details  Name: Erin Contreras MRN: [id number] Date of Birth: 09/06/1960 Referring Provider:   Flowsheet Row Pulmonary Rehab from 02/16/2024 in [facility] Cardiac and Pulmonary Rehab  Referring Provider Brena Reusing, MD    Initial Encounter Date:  Flowsheet Row Pulmonary Rehab from 02/16/2024 in [facility] Cardiac and Pulmonary Rehab  Date 02/16/24    Visit Diagnosis: Shortness of breath  Interstitial lung disease (HCC)  Patient's Home Medications on Admission:  Current Outpatient Medications:    tirzepatide (ZEPBOUND) 2.5 MG/0.5ML Pen, Inject 2.5 mg into the skin once a week for 28 days. DX code G47.33 and E66.9, Disp: 2 mL, Rfl: 0   acyclovir  (ZOVIRAX ) 400 MG tablet, Take 1 tablet (400 mg total) by mouth 2 (two) times daily as needed., Disp: 180 tablet, Rfl: 4   albuterol  (VENTOLIN  HFA) 108 (90 Base) MCG/ACT inhaler, Inhale 2 puffs into the lungs every 6 (six) hours as needed for wheezing or shortness of breath., Disp: 18 g, Rfl: 2   Cholecalciferol  1.25 MG (50000 UT) TABS, Take 1 tablet by mouth once a week., Disp: 12 tablet, Rfl: 4   conjugated estrogens  (PREMARIN ) vaginal cream, Place 0.25 Applicatorfuls vaginally at bedtime for 14 days, THEN 0.25 Applicatorfuls every other day for 14 days, THEN 0.25 Applicatorfuls 2 (two) times a week., Disp: 42.5 g, Rfl: 12   DULoxetine  (CYMBALTA ) 20 MG capsule, Take 2 capsules (40 mg total) by mouth every morning., Disp: 60 capsule, Rfl: 2   famotidine  (PEPCID ) 40 MG tablet, Take 1 tablet (40 mg total) by mouth at bedtime as needed for heartburn or indigestion., Disp: 90 tablet, Rfl: 3   folic acid  (FOLVITE ) 1 MG tablet, Take 1 mg by mouth daily., Disp: , Rfl:    hydroxychloroquine (PLAQUENIL) 200 MG tablet, Take 200 mg by mouth 2 (two) times daily., Disp: , Rfl:    meloxicam  (MOBIC ) 15 MG tablet, Take 15 mg by mouth daily., Disp: , Rfl:    methotrexate  (RHEUMATREX) 2.5 MG tablet, Take 20 mg by mouth once a  week., Disp: , Rfl:    pantoprazole  (PROTONIX ) 40 MG tablet, Take 1 tablet (40 mg total) by mouth 2 (two) times daily., Disp: 180 tablet, Rfl: 3   pregabalin  (LYRICA ) 150 MG capsule, Take 1 capsule (150 mg total) by mouth at bedtime., Disp: 30 capsule, Rfl: 5   Tocilizumab (ACTEMRA ACTPEN) 162 MG/0.9ML SOAJ, Inject into the skin., Disp: , Rfl:    vitamin B-12 (CYANOCOBALAMIN ) 100 MCG tablet, Take 100 mcg by mouth daily., Disp: , Rfl:   Past Medical History: Past Medical History:  Diagnosis Date   Boils    Ear mass    GERD (gastroesophageal reflux disease)    Hemorrhoids    Herpes simplex virus infection    HSV infection    Lumbago with sciatica    OA (osteoarthritis) of knee    Obesity    Overactive bladder    PONV (postoperative nausea and vomiting)    Pre-diabetes    RA (rheumatoid arthritis) (HCC)    Sleep apnea    does not use cpap-had bariatric surgery and has no issues since surgery   Urge and stress incontinence     Tobacco Use: Social History   Tobacco Use  Smoking Status Never  Smokeless Tobacco Never    Labs: Review Flowsheet  More data exists      Latest Ref Rng & Units 10/08/2022 01/25/2023 07/28/2023 12/06/2023 06/11/2024  Labs for ITP Cardiac and Pulmonary Rehab  Cholestrol 100 - 199 mg/dL 792  815  813  771  769   LDL (calc) 0 - 99 mg/dL 883  898  891  857  864   HDL-C >39 mg/dL 65  61  60  62  66   Trlycerides 0 - 149 mg/dL 848  874  899  863  836   Hemoglobin A1c 4.8 - 5.6 % 6.2  6.3  6.1  5.8  5.8      Pulmonary Assessment Scores:  Pulmonary Assessment Scores     Row Name 02/16/24 1025         ADL UCSD   ADL Phase Entry     SOB Score total 62     Rest 3     Walk 2     Stairs 2     Bath 2     Dress 2     Shop 3       CAT Score   CAT Score 11       mMRC Score   mMRC Score 4        UCSD: Self-administered rating of dyspnea associated with activities of daily living (ADLs) 6-point scale (0 = not at all to 5 = maximal or unable to  do because of breathlessness)  Scoring Scores range from 0 to 120.  Minimally important difference is 5 units  CAT: CAT can identify the health impairment of COPD patients and is better correlated with disease progression.  CAT has a scoring range of zero to 40. The CAT score is classified into four groups of low (less than 10), medium (10 - 20), high (21-30) and very high (31-40) based on the impact level of disease on health status. A CAT score over 10 suggests significant symptoms.  A worsening CAT score could be explained by an exacerbation, poor medication adherence, poor inhaler technique, or progression of COPD or comorbid conditions.  CAT MCID is 2 points  mMRC: mMRC (Modified Medical Research Council) Dyspnea Scale is used to assess the degree of baseline functional disability in patients of respiratory disease due to dyspnea. No minimal important difference is established. A decrease in score of 1 point or greater is considered a positive change.   Pulmonary Function Assessment:   Exercise Target Goals: Exercise Program Goal: Individual exercise prescription set using results from initial 6 min walk test and THRR while considering  patient's activity barriers and safety.   Exercise Prescription Goal: Initial exercise prescription builds to 30-45 minutes a day of aerobic activity, 2-3 days per week.  Home exercise guidelines will be given to patient during program as part of exercise prescription that the participant will acknowledge.  Education: Aerobic Exercise: - Group verbal and visual presentation on the components of exercise prescription. Introduces F.I.T.T principle from ACSM for exercise prescriptions.  Reviews F.I.T.T. principles of aerobic exercise including progression. Written material provided at class time. Flowsheet Row Pulmonary Rehab from 06/06/2024 in [facility] Cardiac and Pulmonary Rehab  Date 05/16/24  Educator mb    Education: Resistance Exercise: - Group  verbal and visual presentation on the components of exercise prescription. Introduces F.I.T.T principle from ACSM for exercise prescriptions  Reviews F.I.T.T. principles of resistance exercise including progression. Written material provided at class time. Flowsheet Row Pulmonary Rehab from 06/06/2024 in [facility] Cardiac and Pulmonary Rehab  Date 02/29/24  Educator [facility]  Instruction Review Code 1- [employer] Understanding     Education: Exercise & Equipment Safety: - Individual verbal instruction and demonstration of equipment  use and safety with use of the equipment. Flowsheet Row Pulmonary Rehab from 02/16/2024 in [facility] Cardiac and Pulmonary Rehab  Date 02/16/24  Educator MB  Instruction Review Code 1- Verbalizes Understanding    Education: Exercise Physiology & General Exercise Guidelines: - Group verbal and written instruction with models to review the exercise physiology of the cardiovascular system and associated critical values. Provides general exercise guidelines with specific guidelines to those with heart or lung disease.  Flowsheet Row Pulmonary Rehab from 06/06/2024 in [facility] Cardiac and Pulmonary Rehab  Education need identified 02/27/24  Date 05/02/24  Educator [facility]  Instruction Review Code 1- [employer] Understanding    Education: Flexibility, Balance, Mind/Body Relaxation: - Group verbal and visual presentation with interactive activity on the components of exercise prescription. Introduces F.I.T.T principle from ACSM for exercise prescriptions. Reviews F.I.T.T. principles of flexibility and balance exercise training including progression. Also discusses the mind body connection.  Reviews various relaxation techniques to help reduce and manage stress (i.e. Deep breathing, progressive muscle relaxation, and visualization). Balance handout provided to take home. Written material provided at class time. Flowsheet Row Pulmonary Rehab from 06/06/2024 in [facility] Cardiac and Pulmonary Rehab  Date  05/09/24  Educator [facility]  Instruction Review Code 1- Verbalizes Understanding    Activity Barriers & Risk Stratification:  Activity Barriers & Cardiac Risk Stratification - 02/16/24 1016       Activity Barriers & Cardiac Risk Stratification   Activity Barriers Arthritis;Joint Problems;Shortness of Breath;History of Falls;Left Knee Replacement;Right Knee Replacement          6 Minute Walk:  6 Minute Walk     Row Name 02/16/24 1015 06/18/24 1409       6 Minute Walk   Phase Initial Discharge    Distance 1100 feet 1295 feet    Distance % Change -- 17.73 %    Distance Feet Change -- 195 ft    Walk Time 6 minutes 6 minutes    # of Rest Breaks 0 0    MPH 2.08 2.45    METS 2.34 2.87    RPE 11 9    Perceived Dyspnea  0 0    VO2 Peak 8.19 10.06    Symptoms No No    Resting HR 78 bpm 96 bpm    Resting BP 126/80 118/80    Resting Oxygen Saturation  93 % 93 %    Exercise Oxygen Saturation  during 6 min walk 90 % 92 %    Max Ex. HR 125 bpm 133 bpm    Max Ex. BP 160/80 170/80    2 Minute Post BP 118/80 144/88      Interval HR   1 Minute HR 90 117    2 Minute HR 106 128    3 Minute HR 125 83    4 Minute HR 108 91    5 Minute HR 101 95    6 Minute HR 107 133    2 Minute Post HR 88 109    Interval Heart Rate? Yes Yes      Interval Oxygen   Interval Oxygen? Yes Yes    Baseline Oxygen Saturation % 93 % 93 %    1 Minute Oxygen Saturation % 93 % 94 %    1 Minute Liters of Oxygen 0 L 0 L    2 Minute Oxygen Saturation % 91 % 92 %    2 Minute Liters of Oxygen 0 L 0 L    3 Minute Oxygen Saturation %  91 % 94 %    3 Minute Liters of Oxygen 0 L 0 L    4 Minute Oxygen Saturation % 91 % 93 %    4 Minute Liters of Oxygen 0 L 0 L    5 Minute Oxygen Saturation % 90 % 93 %    5 Minute Liters of Oxygen 0 L 0 L    6 Minute Oxygen Saturation % 90 % 92 %    6 Minute Liters of Oxygen 0 L 0 L    2 Minute Post Oxygen Saturation % 94 % 95 %    2 Minute Post Liters of Oxygen 0 L 0 L       Oxygen Initial Assessment:  Oxygen Initial Assessment - 02/13/24 1346       Home Oxygen   Home Oxygen Device None    Sleep Oxygen Prescription CPAP   not using,has an appt scheduled to be fitted for new CPAP   Home Exercise Oxygen Prescription None    Home Resting Oxygen Prescription None      Intervention   Short Term Goals To learn and exhibit compliance with exercise, home and travel O2 prescription;To learn and understand importance of monitoring SPO2 with pulse oximeter and demonstrate accurate use of the pulse oximeter.;To learn and understand importance of maintaining oxygen saturations>88%;To learn and demonstrate proper pursed lip breathing techniques or other breathing techniques. ;To learn and demonstrate proper use of respiratory medications    Long  Term Goals Exhibits compliance with exercise, home  and travel O2 prescription;Verbalizes importance of monitoring SPO2 with pulse oximeter and return demonstration;Maintenance of O2 saturations>88%;Exhibits proper breathing techniques, such as pursed lip breathing or other method taught during program session;Compliance with respiratory medication          Oxygen Re-Evaluation:  Oxygen Re-Evaluation     Row Name 02/27/24 1414 03/26/24 1359 04/23/24 1552 05/21/24 1621 06/06/24 1433     Program Oxygen Prescription   Program Oxygen Prescription -- -- -- None None     Home Oxygen   Home Oxygen Device -- None None None None   Sleep Oxygen Prescription -- CPAP CPAP CPAP CPAP   Home Exercise Oxygen Prescription -- None None None None   Home Resting Oxygen Prescription -- None None None None   Compliance with Home Oxygen Use -- -- -- -- Yes     Goals/Expected Outcomes   Short Term Goals -- To learn and demonstrate proper pursed lip breathing techniques or other breathing techniques.  To learn and demonstrate proper pursed lip breathing techniques or other breathing techniques.  To learn and demonstrate proper pursed lip  breathing techniques or other breathing techniques.  To learn and understand importance of monitoring SPO2 with pulse oximeter and demonstrate accurate use of the pulse oximeter.;To learn and understand importance of maintaining oxygen saturations>88%;To learn and demonstrate proper pursed lip breathing techniques or other breathing techniques.    Long  Term Goals -- Exhibits proper breathing techniques, such as pursed lip breathing or other method taught during program session Exhibits proper breathing techniques, such as pursed lip breathing or other method taught during program session Exhibits proper breathing techniques, such as pursed lip breathing or other method taught during program session Verbalizes importance of monitoring SPO2 with pulse oximeter and return demonstration;Maintenance of O2 saturations>88%;Exhibits proper breathing techniques, such as pursed lip breathing or other method taught during program session   Comments Reviewed PLB technique with pt.  Talked about how it works and it's importance  in maintaining their exercise saturations. Reviewed PLB technique with pt. Talked about how it works and it's importance in maintaining their exercise saturations. Moranda reports that she does occasionally experience SOB at home with different activities. We reviewed PLB technique with pt. Talked about how it works and it's importance in maintaining her oxygen saturations and improving SOB. Michon reports that she does occasionally experience SOB at home with different activities, but has stated she feels like it is due to her tiredness. We reviewed PLB technique with pt. Talked about how it works and it's importance in maintaining her oxygen saturations and improving SOB. Naeema reports that she has seen improvements in her SOB since starting the program. She has purchased a pulse ox and is now monitoring SaO2 at home. She demonstrates knowledge of how to keep SaO2 above 88% and pursed lip breathing.    Goals/Expected Outcomes Short: Become more profiecient at using PLB.   Long: Become independent at using PLB. Short: Become more profiecient at using PLB. Long: Become independent at using PLB. Short: Become more profiecient at using PLB. Long: Become independent at using PLB. Short: Become more profiecient at using PLB. Long: Become independent at using PLB. Short: continue to use PLB at home to help control SOB. Long: continue to manage pulmonary health and improve QOL with less SOB.      Oxygen Discharge (Final Oxygen Re-Evaluation):  Oxygen Re-Evaluation - 06/06/24 1433       Program Oxygen Prescription   Program Oxygen Prescription None      Home Oxygen   Home Oxygen Device None    Sleep Oxygen Prescription CPAP    Home Exercise Oxygen Prescription None    Home Resting Oxygen Prescription None    Compliance with Home Oxygen Use Yes      Goals/Expected Outcomes   Short Term Goals To learn and understand importance of monitoring SPO2 with pulse oximeter and demonstrate accurate use of the pulse oximeter.;To learn and understand importance of maintaining oxygen saturations>88%;To learn and demonstrate proper pursed lip breathing techniques or other breathing techniques.     Long  Term Goals Verbalizes importance of monitoring SPO2 with pulse oximeter and return demonstration;Maintenance of O2 saturations>88%;Exhibits proper breathing techniques, such as pursed lip breathing or other method taught during program session    Comments Pricilla reports that she has seen improvements in her SOB since starting the program. She has purchased a pulse ox and is now monitoring SaO2 at home. She demonstrates knowledge of how to keep SaO2 above 88% and pursed lip breathing.    Goals/Expected Outcomes Short: continue to use PLB at home to help control SOB. Long: continue to manage pulmonary health and improve QOL with less SOB.          Initial Exercise Prescription:  Initial Exercise Prescription  - 02/16/24 1000       Date of Initial Exercise RX and Referring Provider   Date 02/16/24    Referring Provider Brena Reusing, MD      Oxygen   Maintain Oxygen Saturation 88% or higher      Treadmill   MPH 1.8   Try   Grade 0    Minutes 15    METs 2.38      Recumbant Bike   Level 2    RPM 50    Watts 25    Minutes 15    METs 2.34      REL-XR   Level 1    Speed 50  Minutes 15    METs 2.34      T5 Nustep   Level 2    SPM 80    Minutes 15    METs 2.34      Track   Laps 25    Minutes 15    METs 2.36      Prescription Details   Frequency (times per week) 2    Duration Progress to 30 minutes of continuous aerobic without signs/symptoms of physical distress      Intensity   THRR 40-80% of Max Heartrate 109-140    Ratings of Perceived Exertion 11-13    Perceived Dyspnea 0-4      Progression   Progression Continue to progress workloads to maintain intensity without signs/symptoms of physical distress.      Resistance Training   Training Prescription Yes    Weight 4 lb    Reps 10-15          Perform Capillary Blood Glucose checks as needed.  Exercise Prescription Changes:   Exercise Prescription Changes     Row Name 02/16/24 1000 03/12/24 1700 03/29/24 1500 04/12/24 1500 04/16/24 1400     Response to Exercise   Blood Pressure (Admit) 126/80 122/70 122/86 138/82 --   Blood Pressure (Exercise) 160/80 170/90 146/84 198/90 --   Blood Pressure (Exit) 118/80 124/68 126/74 114/76 --   Heart Rate (Admit) 78 bpm 88 bpm 96 bpm 96 bpm --   Heart Rate (Exercise) 125 bpm 121 bpm 126 bpm 119 bpm --   Heart Rate (Exit) 81 bpm 87 bpm 90 bpm 105 bpm --   Oxygen Saturation (Admit) 93 % 94 % 93 % 95 % --   Oxygen Saturation (Exercise) 90 % 90 % 89 % 88 % --   Oxygen Saturation (Exit) 94 % 94 % 90 % 92 % --   Rating of Perceived Exertion (Exercise) 11 13 15 13  --   Perceived Dyspnea (Exercise) 0 0 0 0 --   Symptoms none none none none --   Comments  results 1st 2 weeks of exercise sessions -- -- --   Duration -- Progress to 30 minutes of  aerobic without signs/symptoms of physical distress Continue with 30 min of aerobic exercise without signs/symptoms of physical distress. Continue with 30 min of aerobic exercise without signs/symptoms of physical distress. --   Intensity THRR New THRR unchanged THRR unchanged THRR unchanged --     Progression   Progression -- Continue to progress workloads to maintain intensity without signs/symptoms of physical distress. Continue to progress workloads to maintain intensity without signs/symptoms of physical distress. Continue to progress workloads to maintain intensity without signs/symptoms of physical distress. --   Average METs 2.34 2.26 2.59 2.53 --     Resistance Training   Training Prescription -- Yes Yes Yes --   Weight -- 4 lb 4 lb 4 lb --   Reps -- 10-15 10-15 10-15 --     Interval Training   Interval Training -- No No No --     Treadmill   MPH -- 1.8 1.8 1.6 --   Grade -- 0 0 0 --   Minutes -- 15 15 15  --   METs -- 2.38 2.38 2.23 --     Recumbant Bike   Level -- 3 -- -- --   Watts -- 15 -- -- --   Minutes -- 15 -- -- --   METs -- 2.34 -- -- --     REL-XR  Level -- 2 2 1  --   Minutes -- 15 15 15  --   METs -- 2.9 3.6 3.3 --     T5 Nustep   Level -- 2 2 2  --   Minutes -- 15 15 15  --   METs -- 1.8 1.8 1.8 --     Track   Laps -- -- 32 -- --   Minutes -- -- 15 -- --   METs -- -- 2.74 -- --     Home Exercise Plan   Plans to continue exercise at -- -- -- -- [employer] (comment)  [employer] [facility] [employer], aerobic exercise, and other group fitness classes, walking and biking at home, and sit down exercises   Frequency -- -- -- -- Add 2 additional days to program exercise sessions.   Initial Home Exercises Provided -- -- -- -- 04/16/24     Oxygen   Maintain Oxygen Saturation -- 88% or higher 88% or higher 88% or higher --    Row Name 04/26/24 1400 05/09/24  1600 05/23/24 0700 06/06/24 1100 06/19/24 1500     Response to Exercise   Blood Pressure (Admit) 118/78 112/74 122/68 110/70 110/62   Blood Pressure (Exercise) 150/86 -- -- -- --   Blood Pressure (Exit) 118/78 114/70 104/66 118/74 104/62   Heart Rate (Admit) 85 bpm 96 bpm 79 bpm 87 bpm 102 bpm   Heart Rate (Exercise) 103 bpm 110 bpm 106 bpm 111 bpm 119 bpm   Heart Rate (Exit) 93 bpm 103 bpm 94 bpm 88 bpm 81 bpm   Oxygen Saturation (Admit) 94 % 92 % 93 % 91 % 94 %   Oxygen Saturation (Exercise) 90 % 91 % 92 % 90 % 92 %   Oxygen Saturation (Exit) 91 % 96 % 94 % 95 % 94 %   Rating of Perceived Exertion (Exercise) 15 12 13 13 15    Perceived Dyspnea (Exercise) 2 0 1 1 0   Symptoms none none none none none   Duration Continue with 30 min of aerobic exercise without signs/symptoms of physical distress. Continue with 30 min of aerobic exercise without signs/symptoms of physical distress. Continue with 30 min of aerobic exercise without signs/symptoms of physical distress. Continue with 30 min of aerobic exercise without signs/symptoms of physical distress. Continue with 30 min of aerobic exercise without signs/symptoms of physical distress.   Intensity THRR unchanged THRR unchanged THRR unchanged THRR unchanged THRR unchanged     Progression   Progression Continue to progress workloads to maintain intensity without signs/symptoms of physical distress. Continue to progress workloads to maintain intensity without signs/symptoms of physical distress. Continue to progress workloads to maintain intensity without signs/symptoms of physical distress. Continue to progress workloads to maintain intensity without signs/symptoms of physical distress. Continue to progress workloads to maintain intensity without signs/symptoms of physical distress.   Average METs 2.36 2.31 2.2 2.25 2.41     Resistance Training   Training Prescription Yes Yes Yes Yes Yes   Weight 4 lb 4 lb 4 lb 4 lb 4 lb   Reps 10-15 10-15 10-15  10-15 10-15     Interval Training   Interval Training No No No No No     Treadmill   MPH 1.7 1.4 1.6 1.5 1.6   Grade 0 0 0 0 0   Minutes 15 15 15 15 15    METs 2.3 2.07 2.23 2.15 2.23     Recumbant Bike   Level 3 2 -- -- --  Watts 15 15 -- -- --   Minutes 15 15 -- -- --   METs 2.34 2.34 -- -- --     NuStep   Level 1 1  T6 -- -- --   SPM 80 -- -- -- --   Minutes 15 15 -- -- --   METs 2 1.8 -- -- --     REL-XR   Level 1 2 3 2 3    Minutes 15 15 15 15 15    METs 3 2.6 2.7 2.7 2.4     T5 Nustep   Level 1 2 2  -- --   Minutes 15 15 15  -- --   METs 1.9 1.8 1.9 -- --     Biostep-RELP   Level 1 -- -- -- --   SPM 50 -- -- -- --   Minutes 15 -- -- -- --   METs 2 -- -- -- --     Home Exercise Plan   Plans to continue exercise at [employer] (comment)  [employer], aerobic exercise, and other group fitness classes, walking and biking at home, and sit down exercises [employer] (comment)  [employer], aerobic exercise, and other group fitness classes, walking and biking at home, and sit down exercises [employer] (comment)  [employer], aerobic exercise, and other group fitness classes, walking and biking at home, and sit down exercises [employer] (comment)  [employer], aerobic exercise, and other group fitness classes, walking and biking at home, and sit down exercises [employer] (comment)  [employer], aerobic exercise, and other group fitness classes, walking and biking at home, and sit down exercises   Frequency Add 2 additional days to program exercise sessions. Add 2 additional days to program exercise sessions. Add 2 additional days to program exercise sessions. Add 2 additional days to program exercise sessions. Add 2 additional days to program exercise sessions.   Initial Home Exercises Provided 04/16/24 04/16/24 04/16/24 04/16/24  04/16/24     Oxygen   Maintain Oxygen Saturation 88% or higher 88% or higher 88% or higher 88% or higher 88% or higher      Exercise Comments:   Exercise Comments     Row Name 02/27/24 1412           Exercise Comments First full day of exercise!  Patient was oriented to gym and equipment including functions, settings, policies, and procedures.  Patient's individual exercise prescription and treatment plan were reviewed.  All starting workloads were established based on the results of the 6 minute walk test done at initial orientation visit.  The plan for exercise progression was also introduced and progression will be customized based on patient's performance and goals.          Exercise Goals and Review:   Exercise Goals     Row Name 02/16/24 1020             Exercise Goals   Increase Physical Activity Yes       Intervention Provide advice, education, support and counseling about physical activity/exercise needs.;Develop an individualized exercise prescription for aerobic and resistive training based on initial evaluation findings, risk stratification, comorbidities and participant's personal goals.       Expected Outcomes Short Term: Attend rehab on a regular basis to increase amount of physical activity.;Long Term: Add in home exercise to make exercise part of routine and to increase amount of physical activity.;Long  Term: Exercising regularly at least 3-5 days a week.       Increase Strength and Stamina Yes       Intervention Provide advice, education, support and counseling about physical activity/exercise needs.;Develop an individualized exercise prescription for aerobic and resistive training based on initial evaluation findings, risk stratification, comorbidities and participant's personal goals.       Expected Outcomes Short Term: Increase workloads from initial exercise prescription for resistance, speed, and METs.;Short Term: Perform resistance training exercises  routinely during rehab and add in resistance training at home;Long Term: Improve cardiorespiratory fitness, muscular endurance and strength as measured by increased METs and functional capacity ( )       Able to understand and use rate of perceived exertion (RPE) scale Yes       Intervention Provide education and explanation on how to use RPE scale       Expected Outcomes Short Term: Able to use RPE daily in rehab to express subjective intensity level;Long Term:  Able to use RPE to guide intensity level when exercising independently       Able to understand and use Dyspnea scale Yes       Intervention Provide education and explanation on how to use Dyspnea scale       Expected Outcomes Short Term: Able to use Dyspnea scale daily in rehab to express subjective sense of shortness of breath during exertion;Long Term: Able to use Dyspnea scale to guide intensity level when exercising independently       Knowledge and understanding of Target Heart Rate Range (THRR) Yes       Intervention Provide education and explanation of THRR including how the numbers were predicted and where they are located for reference       Expected Outcomes Short Term: Able to state/look up THRR;Short Term: Able to use daily as guideline for intensity in rehab;Long Term: Able to use THRR to govern intensity when exercising independently       Able to check pulse independently Yes       Intervention Provide education and demonstration on how to check pulse in carotid and radial arteries.;Review the importance of being able to check your own pulse for safety during independent exercise       Expected Outcomes Short Term: Able to explain why pulse checking is important during independent exercise;Long Term: Able to check pulse independently and accurately       Understanding of Exercise Prescription Yes       Intervention Provide education, explanation, and written materials on patient's individual exercise prescription        Expected Outcomes Short Term: Able to explain program exercise prescription;Long Term: Able to explain home exercise prescription to exercise independently          Exercise Goals Re-Evaluation :  Exercise Goals Re-Evaluation     Row Name 02/27/24 1413 03/12/24 1712 03/26/24 1357 03/29/24 1542 04/12/24 1514     Exercise Goal Re-Evaluation   Exercise Goals Review Increase Physical Activity;Knowledge and understanding of Target Heart Rate Range (THRR);Able to understand and use rate of perceived exertion (RPE) scale;Understanding of Exercise Prescription;Increase Strength and Stamina;Able to check pulse independently Increase Physical Activity;Understanding of Exercise Prescription;Increase Strength and Stamina -- Increase Physical Activity;Understanding of Exercise Prescription;Increase Strength and Stamina Increase Physical Activity;Understanding of Exercise Prescription;Increase Strength and Stamina   Comments Reviewed RPE and dyspnea scale, THR and program prescription with pt today.  Pt voiced understanding and was given a copy of goals to take home. Cheyann  is off to a good start in the program and completed her first 2 weeks of exercise sessions in this review. She had workload on the treadmill of a speed of 1.8 mph with no incline. She worked at level 2 on the T5 nustep and XR. She worked at level 3 on the recumbent bike. We will continue to monitor her progress in the program. Eimy is doing well here at rehab. She is doing line dancing 2 days per week on top of rehab exercise. Encouraged her to contineu to exercise at home, and focus on increasing workload here at rehab. Bretta is doing well in rehab. She has continued to work at level 2 on both the XR and T5 nustep machines. She also increased her walking distance to 32 laps on the track. We will continue to monitor her progress in the program. Marcee is doing well in rehab. She maintained level 2 on the T5 nustep. She decreased her speed  slightly to 1.6 mph on the treadmill and decreased to level 1 on the XR. We will continue to monitor her progress in the program.   Expected Outcomes Short: Use RPE daily to regulate intensity.  Long: Follow program prescription in THR. Short: Continue to follow current exercise prescription. Long: Continue exercise to improve strength and stamina. STG: Continue to follow current exercise prescription. LTG: Continue exercise to improve strength and stamina. Short: Begin to progressively increase workloads. Long: Continue exercise to improve strength and stamina. Short: Try to get back to level 2 on the XR and increase speed on the treadmill. Long: Continue exercise to improve strength and stamina.    Row Name 04/16/24 1443 04/26/24 1419 05/07/24 1349 05/09/24 1616 05/23/24 0732     Exercise Goal Re-Evaluation   Exercise Goals Review Increase Physical Activity;Able to understand and use rate of perceived exertion (RPE) scale;Knowledge and understanding of Target Heart Rate Range (THRR);Understanding of Exercise Prescription;Increase Strength and Stamina;Able to understand and use Dyspnea scale;Able to check pulse independently Increase Physical Activity;Understanding of Exercise Prescription;Increase Strength and Stamina Increase Physical Activity;Increase Strength and Stamina;Understanding of Exercise Prescription Increase Physical Activity;Increase Strength and Stamina;Understanding of Exercise Prescription Increase Physical Activity;Increase Strength and Stamina;Understanding of Exercise Prescription   Comments Reviewed home exercise with pt today from 2:09pm to 2:19pm.  Pt plans to go to [employer] for line dancing, aerobic exercise, and other group fitness classes, and walk, bike, and do sit down exercises at home for exercise. She plans to add 2 addtional days of exercise at home.  Reviewed THR, pulse, RPE, sign and symptoms, pulse oximetery and when to call 911 or MD.  Also discussed weather  considerations and indoor options.  Pt voiced understanding. Rebbeca continues to do well in rehab. She increased her workload on the treadmill to a speed of 1.7 mph with no incline. She maintained level 3 on the recumbent bike and level 1 on the XR. She also worked at level 1 on the biostep adn T4 nustep. We will continue to monitor her progress in the program. Home exercise follow-up done today. She reports that she is consistently doing zumba dance and drum classes for exercise 2-3 days a week outside of class. She is trying to get consistent with doing some hand weight exercises at home, but has had more trouble getting consistent with that. Ahjanae is doing good in rehab. She was recently able to increase from level 1 to 2 on both the XR and T5 nustep. She was also able to maintain  a treadmill workload of 1. with no incline. We will continue to monitor her progress in the program. Stayce continues to do well in rehab. She was recently able to increase from level 2 to 3 on the XR. She was also able to increase her speed on the treadmill from 1.4 to 1.45mph. We will continue to monitor her progress in the program.   Expected Outcomes Short : Add 2 additional days of exercise at home. Long: Continue to exercise at home independently. Short: Continue to progressively increase treadmill and XR workloads. Long: Continue exercise to improve strength and stamina. Short: start to incorporate weight training into exercise routine. Long: maintain independent exericse routine upon graduation. Short: Continue to follow exercise prescription, increase workloads when appropriate. Long: Continue exercise to improve strength and stamina. Short: Continue to increase treadmill workloads. Long: Continue exercise to improve strength and stamina.    Row Name 06/06/24 1131 06/06/24 1420 06/19/24 1520         Exercise Goal Re-Evaluation   Exercise Goals Review Increase Physical Activity;Increase Strength and  Stamina;Understanding of Exercise Prescription Increase Physical Activity;Increase Strength and Stamina;Understanding of Exercise Prescription Increase Physical Activity;Increase Strength and Stamina;Understanding of Exercise Prescription     Comments Makala continues to do well in rehab. She has consistently walked on the treadmill at a speed of 1.5 mph with no incline. She also continues to do well with 4 lb hand weights for resistance training. She is also coming due for her post and will look to improve on it. We will continue to monitor her progress in the program. Zeniah continues to come to pulmonary rehab regulalrly but has not yet added and extra days at home. She was encouraged to start doing this while still in the program so that if any concnerns arise with independent exercise at home she will be able to follow up with staff. She does plan to exercise at home once she graduates from the program. Hinda continues to do well in rehab. She is due for her post in the next session in the next review period and hopes to improve. She increased her workload on the treadmill to a speed of 1.6 mph with no incline. She also increased to level 3 on the XR.  We will continue to monitor her progress in the program.     Expected Outcomes Short: Improve on post . Long: Continue to increase overall METs and stamina. Short: add 1-2 days a week of exercise at home. Long: maintain independent exercise routine upon graduation. Short: Continue to progressively increase treadmill and XR workloads. Long: Continue to increase overall METs and stamina.        Discharge Exercise Prescription (Final Exercise Prescription Changes):  Exercise Prescription Changes - 06/19/24 1500       Response to Exercise   Blood Pressure (Admit) 110/62    Blood Pressure (Exit) 104/62    Heart Rate (Admit) 102 bpm    Heart Rate (Exercise) 119 bpm    Heart Rate (Exit) 81 bpm    Oxygen Saturation (Admit) 94 %    Oxygen  Saturation (Exercise) 92 %    Oxygen Saturation (Exit) 94 %    Rating of Perceived Exertion (Exercise) 15    Perceived Dyspnea (Exercise) 0    Symptoms none    Duration Continue with 30 min of aerobic exercise without signs/symptoms of physical distress.    Intensity THRR unchanged      Progression   Progression Continue to progress workloads  to maintain intensity without signs/symptoms of physical distress.    Average METs 2.41      Resistance Training   Training Prescription Yes    Weight 4 lb    Reps 10-15      Interval Training   Interval Training No      Treadmill   MPH 1.6    Grade 0    Minutes 15    METs 2.23      REL-XR   Level 3    Minutes 15    METs 2.4      Home Exercise Plan   Plans to continue exercise at [employer] (comment)   [employer] for [employer], aerobic exercise, and other group fitness classes, walking and biking at home, and sit down exercises   Frequency Add 2 additional days to program exercise sessions.    Initial Home Exercises Provided 04/16/24      Oxygen   Maintain Oxygen Saturation 88% or higher          Nutrition:  Target Goals: Understanding of nutrition guidelines, daily intake of sodium 1500mg , cholesterol 200mg , calories 30% from fat and 7% or less from saturated fats, daily to have 5 or more servings of fruits and vegetables.  Education: Nutrition 1 -Group instruction provided by verbal, written material, interactive activities, discussions, models, and posters to present general guidelines for heart healthy nutrition including macronutrients, label reading, and promoting whole foods over processed counterparts. Education serves as pensions consultant of discussion of heart healthy eating for all. Written material provided at class time. Flowsheet Row Pulmonary Rehab from 06/06/2024 in [facility]  Date 03/14/24  Educator jg  Instruction Review Code 1- Verbalizes Understanding      Education:  Nutrition 2 -Group instruction provided by verbal, written material, interactive activities, discussions, models, and posters to present general guidelines for heart healthy nutrition including sodium, cholesterol, and saturated fat. Providing guidance of habit forming to improve blood pressure, cholesterol, and body weight. Written material provided at class time.     Biometrics:  Pre Biometrics - 02/16/24 1024       Pre Biometrics   Height 5' 9.5 (1.765 m)    Weight 308 lb 12.8 oz (140.1 kg)    Waist Circumference 53.9 inches    Hip Circumference 62 inches    Waist to Hip Ratio 0.87 %    BMI (Calculated) 44.96    Single Leg Stand 12.5 seconds          Post Biometrics - 06/18/24 1413        Post  Biometrics   Height 5' 9.5 (1.765 m)    Weight 306 lb 14.4 oz (139.2 kg)    Waist Circumference 52.3 inches    Hip Circumference 62 inches    Waist to Hip Ratio 0.84 %    BMI (Calculated) 44.69    Single Leg Stand 15 seconds          Nutrition Therapy Plan and Nutrition Goals:  Nutrition Therapy & Goals - 02/27/24 1534       Nutrition Therapy   Diet Cardiac, Low Na    Protein (specify units) 80g    Fiber 25 grams    Whole Grain Foods 3 servings    Saturated Fats 15 max. grams    Fruits and Vegetables 5 servings/day    Sodium 2 grams      Personal Nutrition Goals   Nutrition Goal Eat 3 times per day, small frequent meals or  nutrient dense snacks    Personal Goal #2 Drink 48-64oz of non sugary beverages (not during meals)    Personal Goal #3 Get junk food out of house and build habits around not eating junk on daily basis.    Comments Basha has had bariatric surgery ~9years ago. She has gained the weight back. She reports she struggles with consistency. Says some weeks she will make good food choices, but then the next week she makes poor choices. She reports she likes junk food and eats it often at night. Spoke with her about if it was a sweet craving or habitual.  She felt it was habitual. Recommended she not buy any junk food and cut out sugary beverages. She drinks ~32-48oz of water. Suggested she aim for most of her fluids be water and if she wants sometime different it should be a not caloric beverage. She wants to find a weight loss plan that is sustainable and healthy. Spoke with her about foods and what a nutrition plan could look like. She is interested in making changes, RD will work on filling in knowledge gaps and checking in throughout rehab program to assess progress and consistency towards nutrition goals. Provided mediterranean diet handout, educated on types of fats, sources, and how to read labels. Brainstormed several protein carb paired meals and snacks with foods she likes and will eat.      Intervention Plan   Intervention Prescribe, educate and counsel regarding individualized specific dietary modifications aiming towards targeted core components such as weight, hypertension, lipid management, diabetes, heart failure and other comorbidities.;Nutrition handout(s) given to patient.    Expected Outcomes Long Term Goal: Adherence to prescribed nutrition plan.;Short Term Goal: A plan has been developed with personal nutrition goals set during dietitian appointment.;Short Term Goal: Understand basic principles of dietary content, such as calories, fat, sodium, cholesterol and nutrients.          Nutrition Assessments:  MEDIFICTS Score Key: >=70 Need to make dietary changes  40-70 Heart Healthy Diet <= 40 Therapeutic Level Cholesterol Diet  Flowsheet Row Pulmonary Rehab from 02/27/2024 in [facility]  Picture Your Plate Total Score on Admission 39   Picture Your Plate Scores: <59 Unhealthy dietary pattern with much room for improvement. 41-50 Dietary pattern unlikely to meet recommendations for good health and room for improvement. 51-60 More healthful dietary pattern, with some room for improvement.  >60 Healthy  dietary pattern, although there may be some specific behaviors that could be improved.   Nutrition Goals Re-Evaluation:  Nutrition Goals Re-Evaluation     Row Name 03/26/24 1402 04/23/24 1546 05/21/24 1617 06/06/24 1429       Goals   Comment Helana is doing better with nutrition. She is cutting back on sugar. Asked about several foods and how best to use them. Suggested a few meal/snack ideas to help her stick with impactful and healthy food choices. Angelia reports her nutrition has been up and down as she does better with her diet on some days more than others. She has been working on cutting out sodas and limiting excess bread, as well as junk food. She is still eating out on weekends which she states has not been good for her nutrition. She has been drinking plenty of water. Aundria is still working on improving her diet. She has been able to limit the amount of times she goes out to eat, and her soda intake, but states she doesnt feel like shes eating well at home.  Almeda reports that she continues to limit her out to eat, and continues to decrease her soda intake. She has been making an effort to increase water intake.    Expected Outcome STG: cut back on excessive sugar. LTG: Follow a healthy diet that supports a healthy weight Short: Continue to limit junk food. Long: Continue to practice healthy eating patterns discussed with RD. Short: Continue to limit junk food. Long: Continue to practice healthy eating patterns discussed with RD. Short: continue to start drinking more water. Long: maintain heart healthy diet.       Nutrition Goals Discharge (Final Nutrition Goals Re-Evaluation):  Nutrition Goals Re-Evaluation - 06/06/24 1429       Goals   Comment Aubrie reports that she continues to limit her out to eat, and continues to decrease her soda intake. She has been making an effort to increase water intake.    Expected Outcome Short: continue to start drinking more water. Long: maintain  heart healthy diet.          Psychosocial: Target Goals: Acknowledge presence or absence of significant depression and/or stress, maximize coping skills, provide positive support system. Participant is able to verbalize types and ability to use techniques and skills needed for reducing stress and depression.   Education: Stress, Anxiety, and Depression - Group verbal and visual presentation to define topics covered.  Reviews how body is impacted by stress, anxiety, and depression.  Also discusses healthy ways to reduce stress and to treat/manage anxiety and depression.  Written material provided at class time.   Education: Sleep Hygiene -Provides group verbal and written instruction about how sleep can affect your health.  Define sleep hygiene, discuss sleep cycles and impact of sleep habits. Review good sleep hygiene tips.    Initial Review & Psychosocial Screening:  Initial Psych Review & Screening - 02/13/24 1350       Initial Review   Current issues with History of Depression;Current Stress Concerns    Source of Stress Concerns Chronic Illness    Comments --      Family Dynamics   Good Support System? Yes      Barriers   Psychosocial barriers to participate in program There are no identifiable barriers or psychosocial needs.;The patient should benefit from training in stress management and relaxation.      Screening Interventions   Interventions Encouraged to exercise    Expected Outcomes Short Term goal: Identification and review with participant of any Quality of Life or Depression concerns found by scoring the questionnaire.;Long Term goal: The participant improves quality of Life and PHQ9 Scores as seen by post scores and/or verbalization of changes          Quality of Life Scores:  Scores of 19 and below usually indicate a poorer quality of life in these areas.  A difference of  2-3 points is a clinically meaningful difference.  A difference of 2-3 points in the  total score of the Quality of Life Index has been associated with significant improvement in overall quality of life, self-image, physical symptoms, and general health in studies assessing change in quality of life.  PHQ-9: Review Flowsheet  More data exists      06/11/2024 03/26/2024 02/16/2024 01/30/2024 12/06/2023  Depression screen PHQ 2/9  Decreased Interest 0 1 1 0 0  Down, Depressed, Hopeless 0 0 0 0 0  PHQ - 2 Score 0 1 1 0 0  Altered sleeping 3 1 2 2 3   Tired, decreased energy 3 2  3 3 2   Change in appetite 0 0 0 0 0  Feeling bad or failure about yourself  0 0 0 0 0  Trouble concentrating 0 0 0 0 0  Moving slowly or fidgety/restless 0 0 0 1 0  Suicidal thoughts 0 0 0 0 0  PHQ-9 Score 6 4  6  6  5    Difficult doing work/chores Not difficult at all Not difficult at all Not difficult at all Not difficult at all Not difficult at all    Details       Data saved with a previous flowsheet row definition        Interpretation of Total Score  Total Score Depression Severity:  1-4 = Minimal depression, 5-9 = Mild depression, 10-14 = Moderate depression, 15-19 = Moderately severe depression, 20-27 = Severe depression   Psychosocial Evaluation and Intervention:  Psychosocial Evaluation - 02/13/24 1400       Psychosocial Evaluation & Interventions   Interventions Encouraged to exercise with the program and follow exercise prescription    Comments Patient stated that she has had occasional periods of depression, however does not take medication specifically for that. Although, patient is prescribed an anti-depressant that is also used to help manage chronic pain. Patient stated she does have some stress occasionally related to regular everyday life occurrences, but nothing specific and not all of the time. Patient stated she has a good support system of family and friends. Patient is interested in improving her shortness of breath and learning ways to improve her health and weight through  lifestyle changes such as diet and exercise.    Expected Outcomes Short Term: Attend pulmonary rehab for exercise and education. Long Term: Develop and maintain positive healthcare habits.    Continue Psychosocial Services  Follow up required by staff          Psychosocial Re-Evaluation:  Psychosocial Re-Evaluation     Row Name 03/26/24 1400 04/23/24 1542 05/21/24 1615 06/06/24 1431       Psychosocial Re-Evaluation   Current issues with Current Sleep Concerns Current Sleep Concerns;Current Stress Concerns Current Sleep Concerns;Current Stress Concerns Current Sleep Concerns;Current Stress Concerns    Comments Rorie reports her sleep has been improving, currently getting ~5-6hr of sleep per night. She has been focusing on sleeping, going to bed earlier Bowers reports no major stressors at this time. She also reports that exercise has been a good stress reliever for her. She also states that she is still struggling with sleep, as she is tired during the day bit cannot sleep at night. She is going for a sleep study tonight to gain information to help her improve her sleep quality. Jaiyla is doing well but reports some stress concerns due to her health, and kids. She states that she still struggles with sleep and is tired most days. She plans to see her doctor later this month to try to get another sleep study done. Yuleni reports that she has just started using a CPAP machine in the last week and that it is really helping her sleep better. She reports feeling more rested in the morning and having a better quality of sleep. No reports of concnerns with stress levles, or mental health.    Expected Outcomes STG: continue to attend rehab and focus on good sleep. LTG: Achieve and maintain positive outlook on health and daily life Short: Continue to exercise for stress relief. Long: Maintain positive outlook. Short: Continue to exercise for stress relief, complete a sleep  study. Long: Continue to attend  program to maintain a positive outlook. Short: continue to get used to using CPAP at night. Long: continue to have a good mental health and sleep routine.    Interventions Encouraged to attend Pulmonary Rehabilitation for the exercise Encouraged to attend Pulmonary Rehabilitation for the exercise Encouraged to attend Pulmonary Rehabilitation for the exercise Encouraged to attend Pulmonary Rehabilitation for the exercise    Continue Psychosocial Services  Follow up required by staff Follow up required by staff Follow up required by staff Follow up required by staff       Psychosocial Discharge (Final Psychosocial Re-Evaluation):  Psychosocial Re-Evaluation - 06/06/24 1431       Psychosocial Re-Evaluation   Current issues with Current Sleep Concerns;Current Stress Concerns    Comments Dayona reports that she has just started using a CPAP machine in the last week and that it is really helping her sleep better. She reports feeling more rested in the morning and having a better quality of sleep. No reports of concnerns with stress levles, or mental health.    Expected Outcomes Short: continue to get used to using CPAP at night. Long: continue to have a good mental health and sleep routine.    Interventions Encouraged to attend Pulmonary Rehabilitation for the exercise    Continue Psychosocial Services  Follow up required by staff          Education: Education Goals: Education classes will be provided on a weekly basis, covering required topics. Participant will state understanding/return demonstration of topics presented.  Learning Barriers/Preferences:  Learning Barriers/Preferences - 02/13/24 1349       Learning Barriers/Preferences   Learning Barriers None    Learning Preferences Video;Verbal Instruction;Individual Instruction;Group Instruction;Computer/Internet;Audio;Written Material          General Pulmonary Education Topics:  Infection Prevention: - Provides verbal and  written material to individual with discussion of infection control including proper hand washing and proper equipment cleaning during exercise session. Flowsheet Row Pulmonary Rehab from 02/16/2024 in [facility] Cardiac and Pulmonary Rehab  Date 02/16/24  Educator MB  Instruction Review Code 1- Verbalizes Understanding    Falls Prevention: - Provides verbal and written material to individual with discussion of falls prevention and safety. Flowsheet Row Pulmonary Rehab from 02/16/2024 in [facility] Cardiac and Pulmonary Rehab  Date 02/16/24  Educator MB  Instruction Review Code 1- Verbalizes Understanding    Chronic Lung Disease Review: - Group verbal instruction with posters, models, PowerPoint presentations and videos,  to review new updates, new respiratory medications, new advancements in procedures and treatments. Providing information on websites and 800 numbers for continued self-education. Includes information about supplement oxygen, available portable oxygen systems, continuous and intermittent flow rates, oxygen safety, concentrators, and Medicare reimbursement for oxygen. Explanation of Pulmonary Drugs, including class, frequency, complications, importance of spacers, rinsing mouth after steroid MDI's, and proper cleaning methods for nebulizers. Review of basic lung anatomy and physiology related to function, structure, and complications of lung disease. Review of risk factors. Discussion about methods for diagnosing sleep apnea and types of masks and machines for OSA. Includes a review of the use of types of environmental controls: home humidity, furnaces, filters, dust mite/pet prevention, HEPA vacuums. Discussion about weather changes, air quality and the benefits of nasal washing. Instruction on Warning signs, infection symptoms, calling MD promptly, preventive modes, and value of vaccinations. Review of effective airway clearance, coughing and/or vibration techniques. Emphasizing that all should  Create an Action Plan. Written material provided at class  time. Flowsheet Row Pulmonary Rehab from 06/06/2024 in [facility] Cardiac and Pulmonary Rehab  Education need identified 02/27/24  Date 04/18/24  Educator jh  Instruction Review Code 1- Verbalizes Understanding    AED/CPR: - Group verbal and written instruction with the use of models to demonstrate the basic use of the AED with the basic ABC's of resuscitation.    Tests and Procedures:  - Group verbal and visual presentation and models provide information about basic cardiac anatomy and function. Reviews the testing methods done to diagnose heart disease and the outcomes of the test results. Describes the treatment choices: Medical Management, Angioplasty, or Coronary Bypass Surgery for treating various heart conditions including Myocardial Infarction, Angina, Valve Disease, and Cardiac Arrhythmias.  Written material provided at class time. Flowsheet Row Pulmonary Rehab from 06/06/2024 in [facility] Cardiac and Pulmonary Rehab  Date 06/06/24  Educator lc  Instruction Review Code 1- Verbalizes Understanding    Medication Safety: - Group verbal and visual instruction to review commonly prescribed medications for heart and lung disease. Reviews the medication, class of the drug, and side effects. Includes the steps to properly store meds and maintain the prescription regimen.  Written material given at graduation.   Other: -Provides group and verbal instruction on various topics (see comments)   Knowledge Questionnaire Score:  Knowledge Questionnaire Score - 02/27/24 1701       Knowledge Questionnaire Score   Pre Score 14/18           Core Components/Risk Factors/Patient Goals at Admission:  Personal Goals and Risk Factors at Admission - 02/16/24 1026       Core Components/Risk Factors/Patient Goals on Admission    Weight Management Yes;Obesity;Weight Loss    Intervention Weight Management: Provide education and appropriate  resources to help participant work on and attain dietary goals.;Weight Management/Obesity: Establish reasonable short term and long term weight goals.;Obesity: Provide education and appropriate resources to help participant work on and attain dietary goals.;Weight Management: Develop a combined nutrition and exercise program designed to reach desired caloric intake, while maintaining appropriate intake of nutrient and fiber, sodium and fats, and appropriate energy expenditure required for the weight goal.    Admit Weight 308 lb 12.8 oz (140.1 kg)    Goal Weight: Short Term 231 lb 12.8 oz (105.1 kg)    Goal Weight: Long Term 154 lb 6.4 oz (70 kg)    Expected Outcomes Short Term: Continue to assess and modify interventions until short term weight is achieved;Long Term: Adherence to nutrition and physical activity/exercise program aimed toward attainment of established weight goal;Weight Loss: Understanding of general recommendations for a balanced deficit meal plan, which promotes 1-2 lb weight loss per week and includes a negative energy balance of [phone] kcal/d;Understanding recommendations for meals to include 15-35% energy as protein, 25-35% energy from fat, 35-60% energy from carbohydrates, less than 200mg  of dietary cholesterol, 20-35 gm of total fiber daily;Understanding of distribution of calorie intake throughout the day with the consumption of 4-5 meals/snacks    Improve shortness of breath with ADL's Yes    Intervention Provide education, individualized exercise plan and daily activity instruction to help decrease symptoms of SOB with activities of daily living.    Expected Outcomes Short Term: Improve cardiorespiratory fitness to achieve a reduction of symptoms when performing ADLs;Long Term: Be able to perform more ADLs without symptoms or delay the onset of symptoms    Hypertension Yes    Intervention Provide education on lifestyle modifcations including regular physical activity/exercise,  weight management,  moderate sodium restriction and increased consumption of fresh fruit, vegetables, and low fat dairy, alcohol moderation, and smoking cessation.    Expected Outcomes Short Term: Continued assessment and intervention until BP is < 140/47mm HG in hypertensive participants. < 130/4mm HG in hypertensive participants with diabetes, heart failure or chronic kidney disease.;Long Term: Maintenance of blood pressure at goal levels.    Lipids Yes    Intervention Provide education and support for participant on nutrition & aerobic/resistive exercise along with prescribed medications to achieve LDL 70mg , HDL >40mg .    Expected Outcomes Short Term: Participant states understanding of desired cholesterol values and is compliant with medications prescribed. Participant is following exercise prescription and nutrition guidelines.;Long Term: Cholesterol controlled with medications as prescribed, with individualized exercise RX and with personalized nutrition plan. Value goals: LDL < 70mg , HDL > 40 mg.          Education:Diabetes - Individual verbal and written instruction to review signs/symptoms of diabetes, desired ranges of glucose level fasting, after meals and with exercise. Acknowledge that pre and post exercise glucose checks will be done for 3 sessions at entry of program.   Know Your Numbers and Heart Failure: - Group verbal and visual instruction to discuss disease risk factors for cardiac and pulmonary disease and treatment options.  Reviews associated critical values for Overweight/Obesity, Hypertension, Cholesterol, and Diabetes.  Discusses basics of heart failure: signs/symptoms and treatments.  Introduces Heart Failure Zone chart for action plan for heart failure. Written material provided at class time.   Core Components/Risk Factors/Patient Goals Review:   Goals and Risk Factor Review     Row Name 03/26/24 1404 04/23/24 1549 05/21/24 1619 06/06/24 1422       Core  Components/Risk Factors/Patient Goals Review   Personal Goals Review Improve shortness of breath with ADL's Weight Management/Obesity;Hypertension Weight Management/Obesity;Hypertension Weight Management/Obesity;Hypertension;Lipids;Improve shortness of breath with ADL's    Review Norvella reports that she still has some shortness of breath during ADLs. Says has noticed some improvements since starting rehab. Shayla states that she would like to lose weight, with a long term weight goal of 160 lb. She has been working on weight loss through diet and exercise. She enjoys her Zumba dance class on days away from rehab. She stated that she does not own a personal BP cuff to monitor her BP cuff at home. She plans to get her daughter's help to obtain a personal BP cuff. Gaylynn is still trying to lose weight and her target weight continues to be around 160lbs. We have discussed setting a month goal of 5lbs. She does not check her BP regularly at home and we discussed the importance of checking it/obtaining a BP cuff in order to check it. Nevaya reports that her weight has been consistent but she continues to work toward her weight loss goal. She has not yet gotten a blood pressure cuff to have at home but plans to get one. She does have a pulse ox. She reports that her SOB has improved since starting the program. She takes all meds and follows up with her doctor. She does have a follow up soon to determine if she will need to go on cholesterol medicaiton or not.    Expected Outcomes STG: Continue to attend rehab and use PLB during exertion. LTG: Manage risk factors independently Short: Continue to work on weight loss. Long: Continue to manage lifestyle risk factors. Short: Continue to work on weight loss, lose 5lbs in a month . Long: Continue to manage  lifestyle risk factors. Short: follow up with doctor about cholesterol medication. Lost 5 pounds this month. Long: control cardiac riks factors.       Core  Components/Risk Factors/Patient Goals at Discharge (Final Review):   Goals and Risk Factor Review - 06/06/24 1422       Core Components/Risk Factors/Patient Goals Review   Personal Goals Review Weight Management/Obesity;Hypertension;Lipids;Improve shortness of breath with ADL's    Review Luvenia reports that her weight has been consistent but she continues to work toward her weight loss goal. She has not yet gotten a blood pressure cuff to have at home but plans to get one. She does have a pulse ox. She reports that her SOB has improved since starting the program. She takes all meds and follows up with her doctor. She does have a follow up soon to determine if she will need to go on cholesterol medicaiton or not.    Expected Outcomes Short: follow up with doctor about cholesterol medication. Lost 5 pounds this month. Long: control cardiac riks factors.          ITP Comments:  ITP Comments     Row Name 02/13/24 1402 02/16/24 1014 02/27/24 1412 02/29/24 0831 03/28/24 0911   ITP Comments Initial phone call completed. Diagnosis can be found in [facility] 01/20/2024. EP Orientation scheduled for February 16, 2024 @ 0800. Completed and gym orientation for respiratory care services. Initial ITP created and sent for review to Dr. Faud Aleskerov, Medical Director. First full day of exercise!  Patient was oriented to gym and equipment including functions, settings, policies, and procedures.  Patient's individual exercise prescription and treatment plan were reviewed.  All starting workloads were established based on the results of the 6 minute walk test done at initial orientation visit.  The plan for exercise progression was also introduced and progression will be customized based on patient's performance and goals. 30 Day review completed. Medical Director ITP review done, changes made as directed, and signed approval by Medical Director. new to program. 30 Day review completed. Medical Director ITP review done;  changes made as directed and signed approval by Medical Director.    Row Name 04/25/24 1151 05/23/24 0954 06/20/24 1139       ITP Comments 30 Day review completed. Medical Director ITP review done; changes made as directed and signed approval by Medical Director. 30 Day review completed. Medical Director ITP review done; changes made as directed and signed approval by Medical Director. 30 Day review completed. Medical Director ITP review done; changes made as directed and signed approval by Medical Director.        Comments: 30 Day Review ITP

## 2024-06-25 ENCOUNTER — Encounter: Admitting: Emergency Medicine

## 2024-06-25 DIAGNOSIS — R0602 Shortness of breath: Secondary | ICD-10-CM

## 2024-06-25 DIAGNOSIS — J849 Interstitial pulmonary disease, unspecified: Secondary | ICD-10-CM

## 2024-06-25 NOTE — Progress Notes (Signed)
 Daily Session Note  Patient Details  Name: Erin Contreras MRN: [id number] Date of Birth: 10-Jun-1961 Referring Provider:   Flowsheet Row Pulmonary Rehab from 02/16/2024 in [facility] Cardiac and Pulmonary Rehab  Referring Provider Brena Reusing, MD    Encounter Date: 06/25/2024  Check In:  Session Check In - 06/25/24 1345       Check-In   Supervising physician immediately available to respond to emergencies See telemetry face sheet for immediately available ER MD    Location [facility]-Cardiac & Pulmonary Rehab    Staff Present Leita Franks RN,[name];Maxon Burnell BS, [name];[name], MS, [name];[name], BS, [name]    Virtual Visit No    Medication changes reported     No    Fall or balance concerns reported    No    Warm-up and Cool-down Performed on first and last piece of equipment    Resistance Training Performed Yes    VAD Patient? No    PAD/SET Patient? No      Pain Assessment   Currently in Pain? No/denies             Social History   Tobacco Use  Smoking Status Never  Smokeless Tobacco Never    Goals Met:  Proper associated with RPD/PD & O2 Sat Independence with exercise equipment Using PLB without cueing & demonstrates good technique Exercise tolerated well No report of concerns or symptoms today Strength training completed today  Goals Unmet:  Not Applicable  Comments: Pt able to follow exercise prescription today without complaint.  Will continue to monitor for progression.    Dr. Oneil Pinal is Medical Director for [facility] Cardiac Rehabilitation.  Dr. Fuad Aleskerov is Medical Director for [facility] Pulmonary Rehabilitation.

## 2024-06-27 ENCOUNTER — Encounter

## 2024-07-02 ENCOUNTER — Encounter: Attending: Pulmonary Disease | Admitting: Emergency Medicine

## 2024-07-02 DIAGNOSIS — R0602 Shortness of breath: Secondary | ICD-10-CM | POA: Insufficient documentation

## 2024-07-02 DIAGNOSIS — J849 Interstitial pulmonary disease, unspecified: Secondary | ICD-10-CM | POA: Diagnosis present

## 2024-07-02 NOTE — Progress Notes (Signed)
 Daily Session Note  Patient Details  Name: Erin Contreras MRN: [id number] Date of Birth: May 02, 1961 Referring Provider:   Flowsheet Row Pulmonary Rehab from 02/16/2024 in [facility] Cardiac and Pulmonary Rehab  Referring Provider Brena Reusing, MD    Encounter Date: 07/02/2024  Check In:  Session Check In - 07/02/24 1413       Check-In   Supervising physician immediately available to respond to emergencies See telemetry face sheet for immediately available ER MD    Location [facility]-Cardiac & Pulmonary Rehab    Staff Present Leita Franks RN,[name];Maxon Burnell BS, [name];Meredith Tressa RN,[name];[name], BS, [name];[name], MS, [name]    Virtual Visit No    Medication changes reported     No    Fall or balance concerns reported    No    Warm-up and Cool-down Performed on first and last piece of equipment    Resistance Training Performed Yes    VAD Patient? No    PAD/SET Patient? No      Pain Assessment   Currently in Pain? No/denies             Social History   Tobacco Use  Smoking Status Never  Smokeless Tobacco Never    Goals Met:  Proper associated with RPD/PD & O2 Sat Independence with exercise equipment Using PLB without cueing & demonstrates good technique Exercise tolerated well No report of concerns or symptoms today Strength training completed today  Goals Unmet:  Not Applicable  Comments: Pt able to follow exercise prescription today without complaint.  Will continue to monitor for progression.    Dr. Oneil Pinal is Medical Director for [facility] Cardiac Rehabilitation.  Dr. Fuad Aleskerov is Medical Director for [facility] Pulmonary Rehabilitation.

## 2024-07-04 ENCOUNTER — Encounter

## 2024-07-04 ENCOUNTER — Telehealth: Payer: Self-pay | Admitting: Nurse Practitioner

## 2024-07-04 DIAGNOSIS — R0602 Shortness of breath: Secondary | ICD-10-CM | POA: Diagnosis not present

## 2024-07-04 DIAGNOSIS — J849 Interstitial pulmonary disease, unspecified: Secondary | ICD-10-CM

## 2024-07-04 NOTE — Telephone Encounter (Unsigned)
 Copied from CRM [id number]. Topic: Clinical - Prescription Issue >> Jul 04, 2024 12:07 PM Nathanel BROCKS wrote: Reason for CRM: pt was given a rx for zepbound  and ins denied. The ins company said that he dr could appeal it and try to get it approved. Please resubmit and advised pt.

## 2024-07-04 NOTE — Progress Notes (Signed)
 Daily Session Note  Patient Details  Name: Erin Contreras MRN: [id number] Date of Birth: 1960-08-10 Referring Provider:   Flowsheet Row Pulmonary Rehab from 02/16/2024 in [facility] Cardiac and Pulmonary Rehab  Referring Provider Brena Reusing, MD    Encounter Date: 07/04/2024  Check In:  Session Check In - 07/04/24 1351       Check-In   Supervising physician immediately available to respond to emergencies See telemetry face sheet for immediately available ER MD    Location [facility]-Cardiac & Pulmonary Rehab    Staff Present Burnard Davenport RN,[name],MPA;Laura Cates RN,[name];Jessikah Dicker Dyane BS, [name], [name];[name], BS, [name]    Virtual Visit No    Medication changes reported     No    Fall or balance concerns reported    No    Warm-up and Cool-down Performed on first and last piece of equipment    Resistance Training Performed Yes    VAD Patient? No    PAD/SET Patient? No      Pain Assessment   Currently in Pain? No/denies             Social History   Tobacco Use  Smoking Status Never  Smokeless Tobacco Never    Goals Met:  Proper associated with RPD/PD & O2 Sat Independence with exercise equipment Using PLB without cueing & demonstrates good technique Exercise tolerated well No report of concerns or symptoms today Strength training completed today  Goals Unmet:  Not Applicable  Comments: Pt able to follow exercise prescription today without complaint.  Will continue to monitor for progression.    Dr. Oneil Pinal is Medical Director for [facility] Cardiac Rehabilitation.  Dr. Fuad Aleskerov is Medical Director for [facility] Pulmonary Rehabilitation.

## 2024-07-05 NOTE — Telephone Encounter (Signed)
 Yes, this PA was denied.  I don't see that the PA team submitted the PA, but I did call.  Request ID [id number] was denied due to not having tried the formulary alternatives. Alternatives are Modafinil & armodafinil.

## 2024-07-09 ENCOUNTER — Encounter: Admitting: Emergency Medicine

## 2024-07-09 DIAGNOSIS — R0602 Shortness of breath: Secondary | ICD-10-CM | POA: Diagnosis not present

## 2024-07-09 DIAGNOSIS — J849 Interstitial pulmonary disease, unspecified: Secondary | ICD-10-CM

## 2024-07-09 NOTE — Progress Notes (Signed)
 Daily Session Note  Patient Details  Name: Erin Contreras MRN: [id number] Date of Birth: [birth year] Referring Provider:   Flowsheet Row Pulmonary Rehab from 02/16/2024 in [facility] Cardiac and Pulmonary Rehab  Referring Provider Brena Reusing, MD    Encounter Date: 07/09/2024  Check In:  Session Check In - 07/09/24 1357       Check-In   Supervising physician immediately available to respond to emergencies See telemetry face sheet for immediately available ER MD    Location [facility]-Cardiac & Pulmonary Rehab    Staff Present Leita Franks RN,[name];Maxon Burnell BS, [name];[name], MS, [name];[name], BS, [name]    Virtual Visit No    Medication changes reported     No    Fall or balance concerns reported    No    Warm-up and Cool-down Performed on first and last piece of equipment    Resistance Training Performed Yes    VAD Patient? No    PAD/SET Patient? No      Pain Assessment   Currently in Pain? No/denies             Social History   Tobacco Use  Smoking Status Never  Smokeless Tobacco Never    Goals Met:  Proper associated with RPD/PD & O2 Sat Independence with exercise equipment Using PLB without cueing & demonstrates good technique Exercise tolerated well No report of concerns or symptoms today Strength training completed today  Goals Unmet:  Not Applicable  Comments: Pt able to follow exercise prescription today without complaint.  Will continue to monitor for progression.    Dr. Oneil Pinal is Medical Director for [facility] Cardiac Rehabilitation.  Dr. Fuad Aleskerov is Medical Director for [facility] Pulmonary Rehabilitation.

## 2024-07-10 ENCOUNTER — Telehealth: Payer: Self-pay | Admitting: Pharmacist

## 2024-07-10 NOTE — Telephone Encounter (Addendum)
 Information has been sent to clinical pharmacist for appeals review. It may take 5-7 days to prepare the necessary documentation to request the appeal from the insurance. Please see pharmacy appeal encounter for additional information.

## 2024-07-10 NOTE — Telephone Encounter (Signed)
 The prior authorization was submitted with a diagnosis of sleep apnea. The insurance plan is requesting clinical rationale explaining why the patient cannot try modafinil or armodafinil, as both medications have FDA-approved indications for treating excessive daytime sleepiness in obstructive sleep apnea. The clinical rationale should simply explain the patient's current symptoms and treatment goals--for example, whether they are still having untreated sleep apnea symptoms or if the medication is being used to help with weight loss to improve OSA. This information is required for the appeal, as another PA submission is not an option.  Please advise.  Thank you, Devere Pandy, PharmD Clinical Pharmacist  [place]  Direct Dial: [phone]

## 2024-07-11 ENCOUNTER — Encounter: Admitting: Emergency Medicine

## 2024-07-11 DIAGNOSIS — R0602 Shortness of breath: Secondary | ICD-10-CM

## 2024-07-11 DIAGNOSIS — J849 Interstitial pulmonary disease, unspecified: Secondary | ICD-10-CM

## 2024-07-11 NOTE — Progress Notes (Signed)
 Daily Session Note  Patient Details  Name: HANADI STANLY MRN: [id number] Date of Birth: 20-Aug-1960 Referring Provider:   Flowsheet Row Pulmonary Rehab from 02/16/2024 in [facility]  Referring Provider Brena Reusing, MD    Encounter Date: 07/11/2024  Check In:  Session Check In - 07/11/24 1353       Check-In   Supervising physician immediately available to respond to emergencies See telemetry face sheet for immediately available ER MD    Location [facility]-Cardiac & Pulmonary Rehab    Staff Present Leita Franks RN,[name];[name], BS, [name];[name], [name], [name];Kelly Bollinger [facility]    Virtual Visit No    Medication changes reported     No    Fall or balance concerns reported    No    Warm-up and Cool-down Performed on first and last piece of equipment    Resistance Training Performed Yes    VAD Patient? No    PAD/SET Patient? No      Pain Assessment   Currently in Pain? No/denies             Social History   Tobacco Use  Smoking Status Never  Smokeless Tobacco Never    Goals Met:  Proper associated with RPD/PD & O2 Sat Independence with exercise equipment Using PLB without cueing & demonstrates good technique Exercise tolerated well No report of concerns or symptoms today Strength training completed today  Goals Unmet:  Not Applicable  Comments:  Maleta graduated today from  rehab with 36 sessions completed.  Details of the patient's exercise prescription and what She needs to do in order to continue the prescription and progress were discussed with patient.  Patient was given a copy of prescription and goals.  Patient verbalized understanding. Alzina plans to continue to exercise by attending group fitness classes and walking and biking at home.     Dr. Oneil Pinal is Medical Director for [facility] Cardiac Rehabilitation.  Dr. Fuad Aleskerov is Medical Director for [facility] Pulmonary  Rehabilitation.

## 2024-07-11 NOTE — Progress Notes (Signed)
 Discharge Summary   Erin Contreras  [birth year]   [name] graduated today from  rehab with 36 sessions completed.  Details of the patient's exercise prescription and what She needs to do in order to continue the prescription and progress were discussed with patient.  Patient was given a copy of prescription and goals.  Patient verbalized understanding. [name] plans to continue to exercise by attending group exercise classes and walking and biking at home.   6 Minute Walk     Row Name 02/16/24 1015 06/18/24 1409       6 Minute Walk   Phase Initial Discharge    Distance 1100 feet 1295 feet    Distance % Change -- 17.73 %    Distance Feet Change -- 195 ft    Walk Time 6 minutes 6 minutes    # of Rest Breaks 0 0    MPH 2.08 2.45    METS 2.34 2.87    RPE 11 9    Perceived Dyspnea  0 0    VO2 Peak 8.19 10.06    Symptoms No No    Resting HR 78 bpm 96 bpm    Resting BP 126/80 118/80    Resting Oxygen Saturation  93 % 93 %    Exercise Oxygen Saturation  during 6 min walk 90 % 92 %    Max Ex. HR 125 bpm 133 bpm    Max Ex. BP 160/80 170/80    2 Minute Post BP 118/80 144/88      Interval HR   1 Minute HR 90 117    2 Minute HR 106 128    3 Minute HR 125 83    4 Minute HR 108 91    5 Minute HR 101 95    6 Minute HR 107 133    2 Minute Post HR 88 109    Interval Heart Rate? Yes Yes      Interval Oxygen   Interval Oxygen? Yes Yes    Baseline Oxygen Saturation % 93 % 93 %    1 Minute Oxygen Saturation % 93 % 94 %    1 Minute Liters of Oxygen 0 L 0 L    2 Minute Oxygen Saturation % 91 % 92 %    2 Minute Liters of Oxygen 0 L 0 L    3 Minute Oxygen Saturation % 91 % 94 %    3 Minute Liters of Oxygen 0 L 0 L    4 Minute Oxygen Saturation % 91 % 93 %    4 Minute Liters of Oxygen 0 L 0 L    5 Minute Oxygen Saturation % 90 % 93 %    5 Minute Liters of Oxygen 0 L 0 L    6 Minute Oxygen Saturation % 90 % 92 %    6 Minute Liters of Oxygen 0 L 0 L    2 Minute Post Oxygen Saturation % 94 %  95 %    2 Minute Post Liters of Oxygen 0 L 0 L

## 2024-07-11 NOTE — Progress Notes (Signed)
 Pulmonary Individual Treatment Plan  Patient Details  Name: Erin Contreras MRN: [id number] Date of Birth: 10-Mar-1961 Referring Provider:   Flowsheet Row Pulmonary Rehab from 02/16/2024 in [facility] Cardiac and Pulmonary Rehab  Referring Provider Brena Reusing, MD    Initial Encounter Date:  Flowsheet Row Pulmonary Rehab from 02/16/2024 in [facility] Cardiac and Pulmonary Rehab  Date 02/16/24    Visit Diagnosis: Interstitial lung disease (HCC)  Shortness of breath  Patient's Home Medications on Admission:  Current Outpatient Medications:    tirzepatide  (ZEPBOUND ) 2.5 MG/0.5ML Pen, Inject 2.5 mg into the skin once a week for 28 days. DX code G47.33 and E66.9, Disp: 2 mL, Rfl: 0   acyclovir  (ZOVIRAX ) 400 MG tablet, Take 1 tablet (400 mg total) by mouth 2 (two) times daily as needed., Disp: 180 tablet, Rfl: 4   albuterol  (VENTOLIN  HFA) 108 (90 Base) MCG/ACT inhaler, Inhale 2 puffs into the lungs every 6 (six) hours as needed for wheezing or shortness of breath., Disp: 18 g, Rfl: 2   Cholecalciferol  1.25 MG (50000 UT) TABS, Take 1 tablet by mouth once a week., Disp: 12 tablet, Rfl: 4   conjugated estrogens  (PREMARIN ) vaginal cream, Place 0.25 Applicatorfuls vaginally at bedtime for 14 days, THEN 0.25 Applicatorfuls every other day for 14 days, THEN 0.25 Applicatorfuls 2 (two) times a week., Disp: 42.5 g, Rfl: 12   DULoxetine  (CYMBALTA ) 20 MG capsule, Take 2 capsules (40 mg total) by mouth every morning., Disp: 60 capsule, Rfl: 2   famotidine  (PEPCID ) 40 MG tablet, Take 1 tablet (40 mg total) by mouth at bedtime as needed for heartburn or indigestion., Disp: 90 tablet, Rfl: 3   folic acid  (FOLVITE ) 1 MG tablet, Take 1 mg by mouth daily., Disp: , Rfl:    hydroxychloroquine (PLAQUENIL) 200 MG tablet, Take 200 mg by mouth 2 (two) times daily., Disp: , Rfl:    meloxicam  (MOBIC ) 15 MG tablet, Take 15 mg by mouth daily., Disp: , Rfl:    methotrexate  (RHEUMATREX) 2.5 MG tablet, Take 20 mg by mouth once a  week., Disp: , Rfl:    pantoprazole  (PROTONIX ) 40 MG tablet, Take 1 tablet (40 mg total) by mouth 2 (two) times daily., Disp: 180 tablet, Rfl: 3   pregabalin  (LYRICA ) 150 MG capsule, Take 1 capsule (150 mg total) by mouth at bedtime., Disp: 30 capsule, Rfl: 5   Tocilizumab (ACTEMRA ACTPEN) 162 MG/0.9ML SOAJ, Inject into the skin., Disp: , Rfl:    vitamin B-12 (CYANOCOBALAMIN ) 100 MCG tablet, Take 100 mcg by mouth daily., Disp: , Rfl:   Past Medical History: Past Medical History:  Diagnosis Date   Boils    Ear mass    GERD (gastroesophageal reflux disease)    Hemorrhoids    Herpes simplex virus infection    HSV infection    Lumbago with sciatica    OA (osteoarthritis) of knee    Obesity    Overactive bladder    PONV (postoperative nausea and vomiting)    Pre-diabetes    RA (rheumatoid arthritis) (HCC)    Sleep apnea    does not use cpap-had bariatric surgery and has no issues since surgery   Urge and stress incontinence     Tobacco Use: Social History   Tobacco Use  Smoking Status Never  Smokeless Tobacco Never    Labs: Review Flowsheet  More data exists      Latest Ref Rng & Units 10/08/2022 01/25/2023 07/28/2023 12/06/2023 06/11/2024  Labs for ITP Cardiac and Pulmonary Rehab  Cholestrol 100 - 199 mg/dL 792  815  813  771  769   LDL (calc) 0 - 99 mg/dL 883  898  891  857  864   HDL-C >39 mg/dL 65  61  60  62  66   Trlycerides 0 - 149 mg/dL 848  874  899  863  836   Hemoglobin A1c 4.8 - 5.6 % 6.2  6.3  6.1  5.8  5.8      Pulmonary Assessment Scores:  Pulmonary Assessment Scores     Row Name 02/16/24 1025 06/20/24 1450       ADL UCSD   ADL Phase Entry Exit    SOB Score total 62 56    Rest 3 1    Walk 2 3    Stairs 2 4    Bath 2 1    Dress 2 3    Shop 3 3      CAT Score   CAT Score 11 12      mMRC Score   mMRC Score 4 --       UCSD: Self-administered rating of dyspnea associated with activities of daily living (ADLs) 6-point scale (0 = not at all to  5 = maximal or unable to do because of breathlessness)  Scoring Scores range from 0 to 120.  Minimally important difference is 5 units  CAT: CAT can identify the health impairment of COPD patients and is better correlated with disease progression.  CAT has a scoring range of zero to 40. The CAT score is classified into four groups of low (less than 10), medium (10 - 20), high (21-30) and very high (31-40) based on the impact level of disease on health status. A CAT score over 10 suggests significant symptoms.  A worsening CAT score could be explained by an exacerbation, poor medication adherence, poor inhaler technique, or progression of COPD or comorbid conditions.  CAT MCID is 2 points  mMRC: mMRC (Modified Medical Research Council) Dyspnea Scale is used to assess the degree of baseline functional disability in patients of respiratory disease due to dyspnea. No minimal important difference is established. A decrease in score of 1 point or greater is considered a positive change.   Pulmonary Function Assessment:   Exercise Target Goals: Exercise Program Goal: Individual exercise prescription set using results from initial 6 min walk test and THRR while considering  patients activity barriers and safety.   Exercise Prescription Goal: Initial exercise prescription builds to 30-45 minutes a day of aerobic activity, 2-3 days per week.  Home exercise guidelines will be given to patient during program as part of exercise prescription that the participant will acknowledge.  Education: Aerobic Exercise: - Group verbal and visual presentation on the components of exercise prescription. Introduces F.I.T.T principle from ACSM for exercise prescriptions.  Reviews F.I.T.T. principles of aerobic exercise including progression. Written material provided at class time. Flowsheet Row Pulmonary Rehab from 06/06/2024 in [facility] Cardiac and Pulmonary Rehab  Date 05/16/24  Educator mb    Education:  Resistance Exercise: - Group verbal and visual presentation on the components of exercise prescription. Introduces F.I.T.T principle from ACSM for exercise prescriptions  Reviews F.I.T.T. principles of resistance exercise including progression. Written material provided at class time. Flowsheet Row Pulmonary Rehab from 06/06/2024 in [facility] Cardiac and Pulmonary Rehab  Date 02/29/24  Educator [facility]  Instruction Review Code 1- [employer] Understanding     Education: Exercise & Equipment Safety: - Individual verbal instruction and demonstration of equipment  use and safety with use of the equipment. Flowsheet Row Pulmonary Rehab from 02/16/2024 in [facility] Cardiac and Pulmonary Rehab  Date 02/16/24  Educator MB  Instruction Review Code 1- Verbalizes Understanding    Education: Exercise Physiology & General Exercise Guidelines: - Group verbal and written instruction with models to review the exercise physiology of the cardiovascular system and associated critical values. Provides general exercise guidelines with specific guidelines to those with heart or lung disease.  Flowsheet Row Pulmonary Rehab from 06/06/2024 in [facility] Cardiac and Pulmonary Rehab  Education need identified 02/27/24  Date 05/02/24  Educator [facility]  Instruction Review Code 1- [employer] Understanding    Education: Flexibility, Balance, Mind/Body Relaxation: - Group verbal and visual presentation with interactive activity on the components of exercise prescription. Introduces F.I.T.T principle from ACSM for exercise prescriptions. Reviews F.I.T.T. principles of flexibility and balance exercise training including progression. Also discusses the mind body connection.  Reviews various relaxation techniques to help reduce and manage stress (i.e. Deep breathing, progressive muscle relaxation, and visualization). Balance handout provided to take home. Written material provided at class time. Flowsheet Row Pulmonary Rehab from 06/06/2024 in [facility]  Cardiac and Pulmonary Rehab  Date 05/09/24  Educator [facility]  Instruction Review Code 1- Verbalizes Understanding    Activity Barriers & Risk Stratification:  Activity Barriers & Cardiac Risk Stratification - 02/16/24 1016       Activity Barriers & Cardiac Risk Stratification   Activity Barriers Arthritis;Joint Problems;Shortness of Breath;History of Falls;Left Knee Replacement;Right Knee Replacement          6 Minute Walk:  6 Minute Walk     Row Name 02/16/24 1015 06/18/24 1409       6 Minute Walk   Phase Initial Discharge    Distance 1100 feet 1295 feet    Distance % Change -- 17.73 %    Distance Feet Change -- 195 ft    Walk Time 6 minutes 6 minutes    # of Rest Breaks 0 0    MPH 2.08 2.45    METS 2.34 2.87    RPE 11 9    Perceived Dyspnea  0 0    VO2 Peak 8.19 10.06    Symptoms No No    Resting HR 78 bpm 96 bpm    Resting BP 126/80 118/80    Resting Oxygen Saturation  93 % 93 %    Exercise Oxygen Saturation  during 6 min walk 90 % 92 %    Max Ex. HR 125 bpm 133 bpm    Max Ex. BP 160/80 170/80    2 Minute Post BP 118/80 144/88      Interval HR   1 Minute HR 90 117    2 Minute HR 106 128    3 Minute HR 125 83    4 Minute HR 108 91    5 Minute HR 101 95    6 Minute HR 107 133    2 Minute Post HR 88 109    Interval Heart Rate? Yes Yes      Interval Oxygen   Interval Oxygen? Yes Yes    Baseline Oxygen Saturation % 93 % 93 %    1 Minute Oxygen Saturation % 93 % 94 %    1 Minute Liters of Oxygen 0 L 0 L    2 Minute Oxygen Saturation % 91 % 92 %    2 Minute Liters of Oxygen 0 L 0 L    3 Minute Oxygen Saturation %  91 % 94 %    3 Minute Liters of Oxygen 0 L 0 L    4 Minute Oxygen Saturation % 91 % 93 %    4 Minute Liters of Oxygen 0 L 0 L    5 Minute Oxygen Saturation % 90 % 93 %    5 Minute Liters of Oxygen 0 L 0 L    6 Minute Oxygen Saturation % 90 % 92 %    6 Minute Liters of Oxygen 0 L 0 L    2 Minute Post Oxygen Saturation % 94 % 95 %    2 Minute Post  Liters of Oxygen 0 L 0 L      Oxygen Initial Assessment:  Oxygen Initial Assessment - 02/13/24 1346       Home Oxygen   Home Oxygen Device None    Sleep Oxygen Prescription CPAP   not using,has an appt scheduled to be fitted for new CPAP   Home Exercise Oxygen Prescription None    Home Resting Oxygen Prescription None      Intervention   Short Term Goals To learn and exhibit compliance with exercise, home and travel O2 prescription;To learn and understand importance of monitoring SPO2 with pulse oximeter and demonstrate accurate use of the pulse oximeter.;To learn and understand importance of maintaining oxygen saturations>88%;To learn and demonstrate proper pursed lip breathing techniques or other breathing techniques. ;To learn and demonstrate proper use of respiratory medications    Long  Term Goals Exhibits compliance with exercise, home  and travel O2 prescription;Verbalizes importance of monitoring SPO2 with pulse oximeter and return demonstration;Maintenance of O2 saturations>88%;Exhibits proper breathing techniques, such as pursed lip breathing or other method taught during program session;Compliance with respiratory medication          Oxygen Re-Evaluation:  Oxygen Re-Evaluation     Row Name 02/27/24 1414 03/26/24 1359 04/23/24 1552 05/21/24 1621 06/06/24 1433     Program Oxygen Prescription   Program Oxygen Prescription -- -- -- None None     Home Oxygen   Home Oxygen Device -- None None None None   Sleep Oxygen Prescription -- CPAP CPAP CPAP CPAP   Home Exercise Oxygen Prescription -- None None None None   Home Resting Oxygen Prescription -- None None None None   Compliance with Home Oxygen Use -- -- -- -- Yes     Goals/Expected Outcomes   Short Term Goals -- To learn and demonstrate proper pursed lip breathing techniques or other breathing techniques.  To learn and demonstrate proper pursed lip breathing techniques or other breathing techniques.  To learn and  demonstrate proper pursed lip breathing techniques or other breathing techniques.  To learn and understand importance of monitoring SPO2 with pulse oximeter and demonstrate accurate use of the pulse oximeter.;To learn and understand importance of maintaining oxygen saturations>88%;To learn and demonstrate proper pursed lip breathing techniques or other breathing techniques.    Long  Term Goals -- Exhibits proper breathing techniques, such as pursed lip breathing or other method taught during program session Exhibits proper breathing techniques, such as pursed lip breathing or other method taught during program session Exhibits proper breathing techniques, such as pursed lip breathing or other method taught during program session Verbalizes importance of monitoring SPO2 with pulse oximeter and return demonstration;Maintenance of O2 saturations>88%;Exhibits proper breathing techniques, such as pursed lip breathing or other method taught during program session   Comments Reviewed PLB technique with pt.  Talked about how it works and it's importance  in maintaining their exercise saturations. Reviewed PLB technique with pt. Talked about how it works and it's importance in maintaining their exercise saturations. Atlantis reports that she does occasionally experience SOB at home with different activities. We reviewed PLB technique with pt. Talked about how it works and it's importance in maintaining her oxygen saturations and improving SOB. Tamilyn reports that she does occasionally experience SOB at home with different activities, but has stated she feels like it is due to her tiredness. We reviewed PLB technique with pt. Talked about how it works and it's importance in maintaining her oxygen saturations and improving SOB. Saamiya reports that she has seen improvements in her SOB since starting the program. She has purchased a pulse ox and is now monitoring SaO2 at home. She demonstrates knowledge of how to keep SaO2 above  88% and pursed lip breathing.   Goals/Expected Outcomes Short: Become more profiecient at using PLB.   Long: Become independent at using PLB. Short: Become more profiecient at using PLB. Long: Become independent at using PLB. Short: Become more profiecient at using PLB. Long: Become independent at using PLB. Short: Become more profiecient at using PLB. Long: Become independent at using PLB. Short: continue to use PLB at home to help control SOB. Long: continue to manage pulmonary health and improve QOL with less SOB.      Oxygen Discharge (Final Oxygen Re-Evaluation):  Oxygen Re-Evaluation - 06/06/24 1433       Program Oxygen Prescription   Program Oxygen Prescription None      Home Oxygen   Home Oxygen Device None    Sleep Oxygen Prescription CPAP    Home Exercise Oxygen Prescription None    Home Resting Oxygen Prescription None    Compliance with Home Oxygen Use Yes      Goals/Expected Outcomes   Short Term Goals To learn and understand importance of monitoring SPO2 with pulse oximeter and demonstrate accurate use of the pulse oximeter.;To learn and understand importance of maintaining oxygen saturations>88%;To learn and demonstrate proper pursed lip breathing techniques or other breathing techniques.     Long  Term Goals Verbalizes importance of monitoring SPO2 with pulse oximeter and return demonstration;Maintenance of O2 saturations>88%;Exhibits proper breathing techniques, such as pursed lip breathing or other method taught during program session    Comments Conleigh reports that she has seen improvements in her SOB since starting the program. She has purchased a pulse ox and is now monitoring SaO2 at home. She demonstrates knowledge of how to keep SaO2 above 88% and pursed lip breathing.    Goals/Expected Outcomes Short: continue to use PLB at home to help control SOB. Long: continue to manage pulmonary health and improve QOL with less SOB.          Initial Exercise Prescription:   Initial Exercise Prescription - 02/16/24 1000       Date of Initial Exercise RX and Referring Provider   Date 02/16/24    Referring Provider Brena Reusing, MD      Oxygen   Maintain Oxygen Saturation 88% or higher      Treadmill   MPH 1.8   Try   Grade 0    Minutes 15    METs 2.38      Recumbant Bike   Level 2    RPM 50    Watts 25    Minutes 15    METs 2.34      REL-XR   Level 1    Speed 50  Minutes 15    METs 2.34      T5 Nustep   Level 2    SPM 80    Minutes 15    METs 2.34      Track   Laps 25    Minutes 15    METs 2.36      Prescription Details   Frequency (times per week) 2    Duration Progress to 30 minutes of continuous aerobic without signs/symptoms of physical distress      Intensity   THRR 40-80% of Max Heartrate 109-140    Ratings of Perceived Exertion 11-13    Perceived Dyspnea 0-4      Progression   Progression Continue to progress workloads to maintain intensity without signs/symptoms of physical distress.      Resistance Training   Training Prescription Yes    Weight 4 lb    Reps 10-15          Perform Capillary Blood Glucose checks as needed.  Exercise Prescription Changes:   Exercise Prescription Changes     Row Name 02/16/24 1000 03/12/24 1700 03/29/24 1500 04/12/24 1500 04/16/24 1400     Response to Exercise   Blood Pressure (Admit) 126/80 122/70 122/86 138/82 --   Blood Pressure (Exercise) 160/80 170/90 146/84 198/90 --   Blood Pressure (Exit) 118/80 124/68 126/74 114/76 --   Heart Rate (Admit) 78 bpm 88 bpm 96 bpm 96 bpm --   Heart Rate (Exercise) 125 bpm 121 bpm 126 bpm 119 bpm --   Heart Rate (Exit) 81 bpm 87 bpm 90 bpm 105 bpm --   Oxygen Saturation (Admit) 93 % 94 % 93 % 95 % --   Oxygen Saturation (Exercise) 90 % 90 % 89 % 88 % --   Oxygen Saturation (Exit) 94 % 94 % 90 % 92 % --   Rating of Perceived Exertion (Exercise) 11 13 15 13  --   Perceived Dyspnea (Exercise) 0 0 0 0 --   Symptoms none none none  none --   Comments results 1st 2 weeks of exercise sessions -- -- --   Duration -- Progress to 30 minutes of  aerobic without signs/symptoms of physical distress Continue with 30 min of aerobic exercise without signs/symptoms of physical distress. Continue with 30 min of aerobic exercise without signs/symptoms of physical distress. --   Intensity THRR New THRR unchanged THRR unchanged THRR unchanged --     Progression   Progression -- Continue to progress workloads to maintain intensity without signs/symptoms of physical distress. Continue to progress workloads to maintain intensity without signs/symptoms of physical distress. Continue to progress workloads to maintain intensity without signs/symptoms of physical distress. --   Average METs 2.34 2.26 2.59 2.53 --     Resistance Training   Training Prescription -- Yes Yes Yes --   Weight -- 4 lb 4 lb 4 lb --   Reps -- 10-15 10-15 10-15 --     Interval Training   Interval Training -- No No No --     Treadmill   MPH -- 1.8 1.8 1.6 --   Grade -- 0 0 0 --   Minutes -- 15 15 15  --   METs -- 2.38 2.38 2.23 --     Recumbant Bike   Level -- 3 -- -- --   Watts -- 15 -- -- --   Minutes -- 15 -- -- --   METs -- 2.34 -- -- --     REL-XR  Level -- 2 2 1  --   Minutes -- 15 15 15  --   METs -- 2.9 3.6 3.3 --     T5 Nustep   Level -- 2 2 2  --   Minutes -- 15 15 15  --   METs -- 1.8 1.8 1.8 --     Track   Laps -- -- 32 -- --   Minutes -- -- 15 -- --   METs -- -- 2.74 -- --     Home Exercise Plan   Plans to continue exercise at -- -- -- -- [employer] (comment)  [employer] [facility] [employer], aerobic exercise, and other group fitness classes, walking and biking at home, and sit down exercises   Frequency -- -- -- -- Add 2 additional days to program exercise sessions.   Initial Home Exercises Provided -- -- -- -- 04/16/24     Oxygen   Maintain Oxygen Saturation -- 88% or higher 88% or higher 88% or higher --    Row Name  04/26/24 1400 05/09/24 1600 05/23/24 0700 06/06/24 1100 06/19/24 1500     Response to Exercise   Blood Pressure (Admit) 118/78 112/74 122/68 110/70 110/62   Blood Pressure (Exercise) 150/86 -- -- -- --   Blood Pressure (Exit) 118/78 114/70 104/66 118/74 104/62   Heart Rate (Admit) 85 bpm 96 bpm 79 bpm 87 bpm 102 bpm   Heart Rate (Exercise) 103 bpm 110 bpm 106 bpm 111 bpm 119 bpm   Heart Rate (Exit) 93 bpm 103 bpm 94 bpm 88 bpm 81 bpm   Oxygen Saturation (Admit) 94 % 92 % 93 % 91 % 94 %   Oxygen Saturation (Exercise) 90 % 91 % 92 % 90 % 92 %   Oxygen Saturation (Exit) 91 % 96 % 94 % 95 % 94 %   Rating of Perceived Exertion (Exercise) 15 12 13 13 15    Perceived Dyspnea (Exercise) 2 0 1 1 0   Symptoms none none none none none   Duration Continue with 30 min of aerobic exercise without signs/symptoms of physical distress. Continue with 30 min of aerobic exercise without signs/symptoms of physical distress. Continue with 30 min of aerobic exercise without signs/symptoms of physical distress. Continue with 30 min of aerobic exercise without signs/symptoms of physical distress. Continue with 30 min of aerobic exercise without signs/symptoms of physical distress.   Intensity THRR unchanged THRR unchanged THRR unchanged THRR unchanged THRR unchanged     Progression   Progression Continue to progress workloads to maintain intensity without signs/symptoms of physical distress. Continue to progress workloads to maintain intensity without signs/symptoms of physical distress. Continue to progress workloads to maintain intensity without signs/symptoms of physical distress. Continue to progress workloads to maintain intensity without signs/symptoms of physical distress. Continue to progress workloads to maintain intensity without signs/symptoms of physical distress.   Average METs 2.36 2.31 2.2 2.25 2.41     Resistance Training   Training Prescription Yes Yes Yes Yes Yes   Weight 4 lb 4 lb 4 lb 4 lb 4 lb    Reps 10-15 10-15 10-15 10-15 10-15     Interval Training   Interval Training No No No No No     Treadmill   MPH 1.7 1.4 1.6 1.5 1.6   Grade 0 0 0 0 0   Minutes 15 15 15 15 15    METs 2.3 2.07 2.23 2.15 2.23     Recumbant Bike   Level 3 2 -- -- --  Watts 15 15 -- -- --   Minutes 15 15 -- -- --   METs 2.34 2.34 -- -- --     NuStep   Level 1 1  T6 -- -- --   SPM 80 -- -- -- --   Minutes 15 15 -- -- --   METs 2 1.8 -- -- --     REL-XR   Level 1 2 3 2 3    Minutes 15 15 15 15 15    METs 3 2.6 2.7 2.7 2.4     T5 Nustep   Level 1 2 2  -- --   Minutes 15 15 15  -- --   METs 1.9 1.8 1.9 -- --     Biostep-RELP   Level 1 -- -- -- --   SPM 50 -- -- -- --   Minutes 15 -- -- -- --   METs 2 -- -- -- --     Home Exercise Plan   Plans to continue exercise at [employer] (comment)  [employer], aerobic exercise, and other group fitness classes, walking and biking at home, and sit down exercises [employer] (comment)  [employer], aerobic exercise, and other group fitness classes, walking and biking at home, and sit down exercises [employer] (comment)  [employer], aerobic exercise, and other group fitness classes, walking and biking at home, and sit down exercises [employer] (comment)  [employer], aerobic exercise, and other group fitness classes, walking and biking at home, and sit down exercises [employer] (comment)  [employer], aerobic exercise, and other group fitness classes, walking and biking at home, and sit down exercises   Frequency Add 2 additional days to program exercise sessions. Add 2 additional days to program exercise sessions. Add 2 additional days to program exercise sessions. Add 2 additional days to program exercise sessions. Add 2 additional days to program exercise sessions.   Initial Home Exercises Provided 04/16/24 04/16/24  04/16/24 04/16/24 04/16/24     Oxygen   Maintain Oxygen Saturation 88% or higher 88% or higher 88% or higher 88% or higher 88% or higher    Row Name 07/05/24 1800             Response to Exercise   Blood Pressure (Admit) 114/68       Blood Pressure (Exit) 122/74       Heart Rate (Admit) 86 bpm       Heart Rate (Exercise) 133 bpm       Heart Rate (Exit) 80 bpm       Oxygen Saturation (Admit) 95 %       Oxygen Saturation (Exercise) 91 %       Oxygen Saturation (Exit) 95 %       Rating of Perceived Exertion (Exercise) 11       Perceived Dyspnea (Exercise) 1       Symptoms none       Duration Continue with 30 min of aerobic exercise without signs/symptoms of physical distress.       Intensity THRR unchanged         Progression   Progression Continue to progress workloads to maintain intensity without signs/symptoms of physical distress.       Average METs 2.1         Resistance Training   Training Prescription Yes       Weight 4 lb  Reps 10-15         Interval Training   Interval Training No         Treadmill   MPH 1.4       Grade 0       Minutes 15       METs 2.07         REL-XR   Level 4       Minutes 15       METs 2.3         T5 Nustep   Level 2       Minutes 15       METs 1.8         Home Exercise Plan   Plans to continue exercise at [employer] (comment)  [employer] for [employer], aerobic exercise, and other group fitness classes, walking and biking at home, and sit down exercises       Frequency Add 2 additional days to program exercise sessions.       Initial Home Exercises Provided 04/16/24         Oxygen   Maintain Oxygen Saturation 88% or higher          Exercise Comments:   Exercise Comments     Row Name 02/27/24 1412           Exercise Comments First full day of exercise!  Patient was oriented to gym and equipment including functions, settings, policies, and procedures.  Patient's individual exercise prescription  and treatment plan were reviewed.  All starting workloads were established based on the results of the 6 minute walk test done at initial orientation visit.  The plan for exercise progression was also introduced and progression will be customized based on patient's performance and goals.          Exercise Goals and Review:   Exercise Goals     Row Name 02/16/24 1020             Exercise Goals   Increase Physical Activity Yes       Intervention Provide advice, education, support and counseling about physical activity/exercise needs.;Develop an individualized exercise prescription for aerobic and resistive training based on initial evaluation findings, risk stratification, comorbidities and participant's personal goals.       Expected Outcomes Short Term: Attend rehab on a regular basis to increase amount of physical activity.;Long Term: Add in home exercise to make exercise part of routine and to increase amount of physical activity.;Long Term: Exercising regularly at least 3-5 days a week.       Increase Strength and Stamina Yes       Intervention Provide advice, education, support and counseling about physical activity/exercise needs.;Develop an individualized exercise prescription for aerobic and resistive training based on initial evaluation findings, risk stratification, comorbidities and participant's personal goals.       Expected Outcomes Short Term: Increase workloads from initial exercise prescription for resistance, speed, and METs.;Short Term: Perform resistance training exercises routinely during rehab and add in resistance training at home;Long Term: Improve cardiorespiratory fitness, muscular endurance and strength as measured by increased METs and functional capacity ( )       Able to understand and use rate of perceived exertion (RPE) scale Yes       Intervention Provide education and explanation on how to use RPE scale       Expected Outcomes Short Term: Able to use RPE  daily in rehab to express subjective intensity level;Long Term:  Able  to use RPE to guide intensity level when exercising independently       Able to understand and use Dyspnea scale Yes       Intervention Provide education and explanation on how to use Dyspnea scale       Expected Outcomes Short Term: Able to use Dyspnea scale daily in rehab to express subjective sense of shortness of breath during exertion;Long Term: Able to use Dyspnea scale to guide intensity level when exercising independently       Knowledge and understanding of Target Heart Rate Range (THRR) Yes       Intervention Provide education and explanation of THRR including how the numbers were predicted and where they are located for reference       Expected Outcomes Short Term: Able to state/look up THRR;Short Term: Able to use daily as guideline for intensity in rehab;Long Term: Able to use THRR to govern intensity when exercising independently       Able to check pulse independently Yes       Intervention Provide education and demonstration on how to check pulse in carotid and radial arteries.;Review the importance of being able to check your own pulse for safety during independent exercise       Expected Outcomes Short Term: Able to explain why pulse checking is important during independent exercise;Long Term: Able to check pulse independently and accurately       Understanding of Exercise Prescription Yes       Intervention Provide education, explanation, and written materials on patient's individual exercise prescription       Expected Outcomes Short Term: Able to explain program exercise prescription;Long Term: Able to explain home exercise prescription to exercise independently          Exercise Goals Re-Evaluation :  Exercise Goals Re-Evaluation     Row Name 02/27/24 1413 03/12/24 1712 03/26/24 1357 03/29/24 1542 04/12/24 1514     Exercise Goal Re-Evaluation   Exercise Goals Review Increase Physical Activity;Knowledge  and understanding of Target Heart Rate Range (THRR);Able to understand and use rate of perceived exertion (RPE) scale;Understanding of Exercise Prescription;Increase Strength and Stamina;Able to check pulse independently Increase Physical Activity;Understanding of Exercise Prescription;Increase Strength and Stamina -- Increase Physical Activity;Understanding of Exercise Prescription;Increase Strength and Stamina Increase Physical Activity;Understanding of Exercise Prescription;Increase Strength and Stamina   Comments Reviewed RPE and dyspnea scale, THR and program prescription with pt today.  Pt voiced understanding and was given a copy of goals to take home. Devina is off to a good start in the program and completed her first 2 weeks of exercise sessions in this review. She had workload on the treadmill of a speed of 1.8 mph with no incline. She worked at level 2 on the T5 nustep and XR. She worked at level 3 on the recumbent bike. We will continue to monitor her progress in the program. Maree is doing well here at rehab. She is doing line dancing 2 days per week on top of rehab exercise. Encouraged her to contineu to exercise at home, and focus on increasing workload here at rehab. Carolyna is doing well in rehab. She has continued to work at level 2 on both the XR and T5 nustep machines. She also increased her walking distance to 32 laps on the track. We will continue to monitor her progress in the program. Letisia is doing well in rehab. She maintained level 2 on the T5 nustep. She decreased her speed slightly to 1.6 mph on the treadmill  and decreased to level 1 on the XR. We will continue to monitor her progress in the program.   Expected Outcomes Short: Use RPE daily to regulate intensity.  Long: Follow program prescription in THR. Short: Continue to follow current exercise prescription. Long: Continue exercise to improve strength and stamina. STG: Continue to follow current exercise prescription. LTG:  Continue exercise to improve strength and stamina. Short: Begin to progressively increase workloads. Long: Continue exercise to improve strength and stamina. Short: Try to get back to level 2 on the XR and increase speed on the treadmill. Long: Continue exercise to improve strength and stamina.    Row Name 04/16/24 1443 04/26/24 1419 05/07/24 1349 05/09/24 1616 05/23/24 0732     Exercise Goal Re-Evaluation   Exercise Goals Review Increase Physical Activity;Able to understand and use rate of perceived exertion (RPE) scale;Knowledge and understanding of Target Heart Rate Range (THRR);Understanding of Exercise Prescription;Increase Strength and Stamina;Able to understand and use Dyspnea scale;Able to check pulse independently Increase Physical Activity;Understanding of Exercise Prescription;Increase Strength and Stamina Increase Physical Activity;Increase Strength and Stamina;Understanding of Exercise Prescription Increase Physical Activity;Increase Strength and Stamina;Understanding of Exercise Prescription Increase Physical Activity;Increase Strength and Stamina;Understanding of Exercise Prescription   Comments Reviewed home exercise with pt today from 2:09pm to 2:19pm.  Pt plans to go to [employer] for line dancing, aerobic exercise, and other group fitness classes, and walk, bike, and do sit down exercises at home for exercise. She plans to add 2 addtional days of exercise at home.  Reviewed THR, pulse, RPE, sign and symptoms, pulse oximetery and when to call 911 or MD.  Also discussed weather considerations and indoor options.  Pt voiced understanding. Mackinley continues to do well in rehab. She increased her workload on the treadmill to a speed of 1.7 mph with no incline. She maintained level 3 on the recumbent bike and level 1 on the XR. She also worked at level 1 on the biostep adn T4 nustep. We will continue to monitor her progress in the program. Home exercise follow-up done today. She reports that  she is consistently doing zumba dance and drum classes for exercise 2-3 days a week outside of class. She is trying to get consistent with doing some hand weight exercises at home, but has had more trouble getting consistent with that. Jeffie is doing good in rehab. She was recently able to increase from level 1 to 2 on both the XR and T5 nustep. She was also able to maintain a treadmill workload of 1. with no incline. We will continue to monitor her progress in the program. Delani continues to do well in rehab. She was recently able to increase from level 2 to 3 on the XR. She was also able to increase her speed on the treadmill from 1.4 to 1.31mph. We will continue to monitor her progress in the program.   Expected Outcomes Short : Add 2 additional days of exercise at home. Long: Continue to exercise at home independently. Short: Continue to progressively increase treadmill and XR workloads. Long: Continue exercise to improve strength and stamina. Short: start to incorporate weight training into exercise routine. Long: maintain independent exericse routine upon graduation. Short: Continue to follow exercise prescription, increase workloads when appropriate. Long: Continue exercise to improve strength and stamina. Short: Continue to increase treadmill workloads. Long: Continue exercise to improve strength and stamina.    Row Name 06/06/24 1131 06/06/24 1420 06/19/24 1520 07/02/24 1506 07/05/24 1814     Exercise Goal Re-Evaluation  Exercise Goals Review Increase Physical Activity;Increase Strength and Stamina;Understanding of Exercise Prescription Increase Physical Activity;Increase Strength and Stamina;Understanding of Exercise Prescription Increase Physical Activity;Increase Strength and Stamina;Understanding of Exercise Prescription Increase Physical Activity;Increase Strength and Stamina;Understanding of Exercise Prescription Increase Physical Activity;Increase Strength and Stamina;Understanding of  Exercise Prescription   Comments Shonta continues to do well in rehab. She has consistently walked on the treadmill at a speed of 1.5 mph with no incline. She also continues to do well with 4 lb hand weights for resistance training. She is also coming due for her post and will look to improve on it. We will continue to monitor her progress in the program. Allexis continues to come to pulmonary rehab regulalrly but has not yet added and extra days at home. She was encouraged to start doing this while still in the program so that if any concnerns arise with independent exercise at home she will be able to follow up with staff. She does plan to exercise at home once she graduates from the program. Taina continues to do well in rehab. She is due for her post in the next session in the next review period and hopes to improve. She increased her workload on the treadmill to a speed of 1.6 mph with no incline. She also increased to level 3 on the XR.  We will continue to monitor her progress in the program. Melvia states she had gotten off her home exercise routine because of the Thanksgiving holiday. She went back today after a week off and her dance class went well. Her routine at home consists of group fitness dance classes that she enjoys. Mushka is doing well in rehab. She recently completed her post and was able to improve by 17.73%. She was also able to increase from level 3 to 4 on the XR. We will continue to monitor her progress in the program.   Expected Outcomes Short: Improve on post . Long: Continue to increase overall METs and stamina. Short: add 1-2 days a week of exercise at home. Long: maintain independent exercise routine upon graduation. Short: Continue to progressively increase treadmill and XR workloads. Long: Continue to increase overall METs and stamina. Short: Get back into routine with group fitness/dance classes. Long: Continue to exercise independently. Short: Graduate. Long:  Continue exercise to improve strength and stamina.      Discharge Exercise Prescription (Final Exercise Prescription Changes):  Exercise Prescription Changes - 07/05/24 1800       Response to Exercise   Blood Pressure (Admit) 114/68    Blood Pressure (Exit) 122/74    Heart Rate (Admit) 86 bpm    Heart Rate (Exercise) 133 bpm    Heart Rate (Exit) 80 bpm    Oxygen Saturation (Admit) 95 %    Oxygen Saturation (Exercise) 91 %    Oxygen Saturation (Exit) 95 %    Rating of Perceived Exertion (Exercise) 11    Perceived Dyspnea (Exercise) 1    Symptoms none    Duration Continue with 30 min of aerobic exercise without signs/symptoms of physical distress.    Intensity THRR unchanged      Progression   Progression Continue to progress workloads to maintain intensity without signs/symptoms of physical distress.    Average METs 2.1      Resistance Training   Training Prescription Yes    Weight 4 lb    Reps 10-15      Interval Training   Interval Training No  Treadmill   MPH 1.4    Grade 0    Minutes 15    METs 2.07      REL-XR   Level 4    Minutes 15    METs 2.3      T5 Nustep   Level 2    Minutes 15    METs 1.8      Home Exercise Plan   Plans to continue exercise at [facility] (comment)   [employer] for [employer], aerobic exercise, and other group fitness classes, walking and biking at home, and sit down exercises   Frequency Add 2 additional days to program exercise sessions.    Initial Home Exercises Provided 04/16/24      Oxygen   Maintain Oxygen Saturation 88% or higher          Nutrition:  Target Goals: Understanding of nutrition guidelines, daily intake of sodium 1500mg , cholesterol 200mg , calories 30% from fat and 7% or less from saturated fats, daily to have 5 or more servings of fruits and vegetables.  Education: Nutrition 1 -Group instruction provided by verbal, written material, interactive activities, discussions, models, and  posters to present general guidelines for heart healthy nutrition including macronutrients, label reading, and promoting whole foods over processed counterparts. Education serves as pensions consultant of discussion of heart healthy eating for all. Written material provided at class time. Flowsheet Row Pulmonary Rehab from 06/06/2024 in [facility] Card[facility] Pul[facility]hab  Date 03/14/24  Educator jg  Instruction Review Code 1- Verbalizes Understanding      Education: Nutrition 2 -Group instruction provided by verbal, written material, interactive activities, discussions, models, and posters to present general guidelines for heart healthy nutrition including sodium, cholesterol, and saturated fat. Providing guidance of habit forming to improve blood pressure, cholesterol, and body weight. Written material provided at class time.     Biometrics:  Pre Biometrics - 02/16/24 1024       Pre Biometrics   Height 5' 9.5 (1.765 m)    Weight 308 lb 12.8 oz (140.1 kg)    Waist Circumference 53.9 inches    Hip Circumference 62 inches    Waist to Hip Ratio 0.87 %    BMI (Calculated) 44.96    Single Leg Stand 12.5 seconds          Post Biometrics - 06/18/24 1413        Post  Biometrics   Height 5' 9.5 (1.765 m)    Weight 306 lb 14.4 oz (139.2 kg)    Waist Circumference 52.3 inches    Hip Circumference 62 inches    Waist to Hip Ratio 0.84 %    BMI (Calculated) 44.69    Single Leg Stand 15 seconds          Nutrition Therapy Plan and Nutrition Goals:  Nutrition Therapy & Goals - 02/27/24 1534       Nutrition Therapy   Diet Cardiac, Low Na    Protein (specify units) 80g    Fiber 25 grams    Whole Grain Foods 3 servings    Saturated Fats 15 max. grams    Fruits and Vegetables 5 servings/day    Sodium 2 grams      Personal Nutrition Goals   Nutrition Goal Eat 3 times per day, small frequent meals or nutrient dense snacks    Personal Goal #2 Drink 48-64oz of non sugary beverages (not  during meals)    Personal Goal #3 Get junk food out of house and build  habits around not eating junk on daily basis.    Comments Kiah has had bariatric surgery ~9years ago. She has gained the weight back. She reports she struggles with consistency. Says some weeks she will make good food choices, but then the next week she makes poor choices. She reports she likes junk food and eats it often at night. Spoke with her about if it was a sweet craving or habitual. She felt it was habitual. Recommended she not buy any junk food and cut out sugary beverages. She drinks ~32-48oz of water. Suggested she aim for most of her fluids be water and if she wants sometime different it should be a not caloric beverage. She wants to find a weight loss plan that is sustainable and healthy. Spoke with her about foods and what a nutrition plan could look like. She is interested in making changes, RD will work on filling in knowledge gaps and checking in throughout rehab program to assess progress and consistency towards nutrition goals. Provided mediterranean diet handout, educated on types of fats, sources, and how to read labels. Brainstormed several protein carb paired meals and snacks with foods she likes and will eat.      Intervention Plan   Intervention Prescribe, educate and counsel regarding individualized specific dietary modifications aiming towards targeted core components such as weight, hypertension, lipid management, diabetes, heart failure and other comorbidities.;Nutrition handout(s) given to patient.    Expected Outcomes Long Term Goal: Adherence to prescribed nutrition plan.;Short Term Goal: A plan has been developed with personal nutrition goals set during dietitian appointment.;Short Term Goal: Understand basic principles of dietary content, such as calories, fat, sodium, cholesterol and nutrients.          Nutrition Assessments:  MEDIFICTS Score Key: >=70 Need to make dietary changes  40-70 Heart  Healthy Diet <= 40 Therapeutic Level Cholesterol Diet  Flowsheet Row Pulmonary Rehab from 06/20/2024 in [facility] Cardiac and Pulmonary Rehab  Picture Your Plate Total Score on Admission 39  Picture Your Plate Total Score on Discharge 48   Picture Your Plate Scores: <59 Unhealthy dietary pattern with much room for improvement. 41-50 Dietary pattern unlikely to meet recommendations for good health and room for improvement. 51-60 More healthful dietary pattern, with some room for improvement.  >60 Healthy dietary pattern, although there may be some specific behaviors that could be improved.   Nutrition Goals Re-Evaluation:  Nutrition Goals Re-Evaluation     Row Name 03/26/24 1402 04/23/24 1546 05/21/24 1617 06/06/24 1429 07/09/24 1404     Goals   Comment Lian is doing better with nutrition. She is cutting back on sugar. Asked about several foods and how best to use them. Suggested a few meal/snack ideas to help her stick with impactful and healthy food choices. Kristee reports her nutrition has been up and down as she does better with her diet on some days more than others. She has been working on cutting out sodas and limiting excess bread, as well as junk food. She is still eating out on weekends which she states has not been good for her nutrition. She has been drinking plenty of water. Marsheila is still working on improving her diet. She has been able to limit the amount of times she goes out to eat, and her soda intake, but states she doesnt feel like shes eating well at home. Shatarra reports that she continues to limit her out to eat, and continues to decrease her soda intake. She has been making an effort  to increase water intake. Thekla states that she has completely eliminated her soda and juice intake as of this week. She is working on increasing her water consumption, and has been cooking a lot more at home.   Expected Outcome STG: cut back on excessive sugar. LTG: Follow a healthy diet that  supports a healthy weight Short: Continue to limit junk food. Long: Continue to practice healthy eating patterns discussed with RD. Short: Continue to limit junk food. Long: Continue to practice healthy eating patterns discussed with RD. Short: continue to start drinking more water. Long: maintain heart healthy diet. Short: Continue to swap soda for water. Long: maintain a heart healthy diet      Nutrition Goals Discharge (Final Nutrition Goals Re-Evaluation):  Nutrition Goals Re-Evaluation - 07/09/24 1404       Goals   Comment Ezell states that she has completely eliminated her soda and juice intake as of this week. She is working on increasing her water consumption, and has been cooking a lot more at home.    Expected Outcome Short: Continue to swap soda for water. Long: maintain a heart healthy diet          Psychosocial: Target Goals: Acknowledge presence or absence of significant depression and/or stress, maximize coping skills, provide positive support system. Participant is able to verbalize types and ability to use techniques and skills needed for reducing stress and depression.   Education: Stress, Anxiety, and Depression - Group verbal and visual presentation to define topics covered.  Reviews how body is impacted by stress, anxiety, and depression.  Also discusses healthy ways to reduce stress and to treat/manage anxiety and depression.  Written material provided at class time.   Education: Sleep Hygiene -Provides group verbal and written instruction about how sleep can affect your health.  Define sleep hygiene, discuss sleep cycles and impact of sleep habits. Review good sleep hygiene tips.    Initial Review & Psychosocial Screening:  Initial Psych Review & Screening - 02/13/24 1350       Initial Review   Current issues with History of Depression;Current Stress Concerns    Source of Stress Concerns Chronic Illness    Comments --      Family Dynamics   Good Support  System? Yes      Barriers   Psychosocial barriers to participate in program There are no identifiable barriers or psychosocial needs.;The patient should benefit from training in stress management and relaxation.      Screening Interventions   Interventions Encouraged to exercise    Expected Outcomes Short Term goal: Identification and review with participant of any Quality of Life or Depression concerns found by scoring the questionnaire.;Long Term goal: The participant improves quality of Life and PHQ9 Scores as seen by post scores and/or verbalization of changes          Quality of Life Scores:  Scores of 19 and below usually indicate a poorer quality of life in these areas.  A difference of  2-3 points is a clinically meaningful difference.  A difference of 2-3 points in the total score of the Quality of Life Index has been associated with significant improvement in overall quality of life, self-image, physical symptoms, and general health in studies assessing change in quality of life.  PHQ-9: Review Flowsheet  More data exists      06/20/2024 06/11/2024 03/26/2024 02/16/2024 01/30/2024  Depression screen PHQ 2/9  Decreased Interest 1 0 1 1 0  Down, Depressed, Hopeless 0 0 0  0 0  PHQ - 2 Score 1 0 1 1 0  Altered sleeping 1 3 1 2 2   Tired, decreased energy 2 3 2 3 3   Change in appetite 0 0 0 0 0  Feeling bad or failure about yourself  0 0 0 0 0  Trouble concentrating 0 0 0 0 0  Moving slowly or fidgety/restless 0 0 0 0 1  Suicidal thoughts 0 0 0 0 0  PHQ-9 Score 4 6 4  6  6    Difficult doing work/chores Not difficult at all Not difficult at all Not difficult at all Not difficult at all Not difficult at all    Details       Data saved with a previous flowsheet row definition        Interpretation of Total Score  Total Score Depression Severity:  1-4 = Minimal depression, 5-9 = Mild depression, 10-14 = Moderate depression, 15-19 = Moderately severe depression, 20-27 =  Severe depression   Psychosocial Evaluation and Intervention:  Psychosocial Evaluation - 02/13/24 1400       Psychosocial Evaluation & Interventions   Interventions Encouraged to exercise with the program and follow exercise prescription    Comments Patient stated that she has had occasional periods of depression, however does not take medication specifically for that. Although, patient is prescribed an anti-depressant that is also used to help manage chronic pain. Patient stated she does have some stress occasionally related to regular everyday life occurrences, but nothing specific and not all of the time. Patient stated she has a good support system of family and friends. Patient is interested in improving her shortness of breath and learning ways to improve her health and weight through lifestyle changes such as diet and exercise.    Expected Outcomes Short Term: Attend pulmonary rehab for exercise and education. Long Term: Develop and maintain positive healthcare habits.    Continue Psychosocial Services  Follow up required by staff          Psychosocial Re-Evaluation:  Psychosocial Re-Evaluation     Row Name 03/26/24 1400 04/23/24 1542 05/21/24 1615 06/06/24 1431 07/09/24 1402     Psychosocial Re-Evaluation   Current issues with Current Sleep Concerns Current Sleep Concerns;Current Stress Concerns Current Sleep Concerns;Current Stress Concerns Current Sleep Concerns;Current Stress Concerns Current Sleep Concerns;Current Stress Concerns   Comments Bobbye reports her sleep has been improving, currently getting ~5-6hr of sleep per night. She has been focusing on sleeping, going to bed earlier Creston reports no major stressors at this time. She also reports that exercise has been a good stress reliever for her. She also states that she is still struggling with sleep, as she is tired during the day bit cannot sleep at night. She is going for a sleep study tonight to gain information to help  her improve her sleep quality. Bernice is doing well but reports some stress concerns due to her health, and kids. She states that she still struggles with sleep and is tired most days. She plans to see her doctor later this month to try to get another sleep study done. Destani reports that she has just started using a CPAP machine in the last week and that it is really helping her sleep better. She reports feeling more rested in the morning and having a better quality of sleep. No reports of concnerns with stress levles, or mental health. Kately continues to see improvements in her sleep, and energy levels from using the CPAP machine. She  does not report having any stress at this time.   Expected Outcomes STG: continue to attend rehab and focus on good sleep. LTG: Achieve and maintain positive outlook on health and daily life Short: Continue to exercise for stress relief. Long: Maintain positive outlook. Short: Continue to exercise for stress relief, complete a sleep study. Long: Continue to attend program to maintain a positive outlook. Short: continue to get used to using CPAP at night. Long: continue to have a good mental health and sleep routine. Short: Continue to use the CPAP machine at night. Long: Continue to work on managing stress and sleep routine.   Interventions Encouraged to attend Pulmonary Rehabilitation for the exercise Encouraged to attend Pulmonary Rehabilitation for the exercise Encouraged to attend Pulmonary Rehabilitation for the exercise Encouraged to attend Pulmonary Rehabilitation for the exercise Encouraged to attend Pulmonary Rehabilitation for the exercise   Continue Psychosocial Services  Follow up required by staff Follow up required by staff Follow up required by staff Follow up required by staff Follow up required by staff      Psychosocial Discharge (Final Psychosocial Re-Evaluation):  Psychosocial Re-Evaluation - 07/09/24 1402       Psychosocial Re-Evaluation   Current  issues with Current Sleep Concerns;Current Stress Concerns    Comments Roby continues to see improvements in her sleep, and energy levels from using the CPAP machine. She does not report having any stress at this time.    Expected Outcomes Short: Continue to use the CPAP machine at night. Long: Continue to work on managing stress and sleep routine.    Interventions Encouraged to attend Pulmonary Rehabilitation for the exercise    Continue Psychosocial Services  Follow up required by staff          Education: Education Goals: Education classes will be provided on a weekly basis, covering required topics. Participant will state understanding/return demonstration of topics presented.  Learning Barriers/Preferences:  Learning Barriers/Preferences - 02/13/24 1349       Learning Barriers/Preferences   Learning Barriers None    Learning Preferences Video;Verbal Instruction;Individual Instruction;Group Instruction;Computer/Internet;Audio;Written Material          General Pulmonary Education Topics:  Infection Prevention: - Provides verbal and written material to individual with discussion of infection control including proper hand washing and proper equipment cleaning during exercise session. Flowsheet Row Pulmonary Rehab from 02/16/2024 in [facility] Card[facility] and Pulmonary Rehab  Date 02/16/24  Educator MB  Instruction Review Code 1- Verbalizes Understanding    Falls Prevention: - Provides verbal and written material to individual with discussion of falls prevention and safety. Flowsheet Row Pulmonary Rehab from 02/16/2024 in [facility] Cardiac and Pulmonary Rehab  Date 02/16/24  Educator MB  Instruction Review Code 1- Verbalizes Understanding    Chronic Lung Disease Review: - Group verbal instruction with posters, models, PowerPoint presentations and videos,  to review new updates, new respiratory medications, new advancements in procedures and treatments. Providing information on websites  and 800 numbers for continued self-education. Includes information about supplement oxygen, available portable oxygen systems, continuous and intermittent flow rates, oxygen safety, concentrators, and Medicare reimbursement for oxygen. Explanation of Pulmonary Drugs, including class, frequency, complications, importance of spacers, rinsing mouth after steroid MDI's, and proper cleaning methods for nebulizers. Review of basic lung anatomy and physiology related to function, structure, and complications of lung disease. Review of risk factors. Discussion about methods for diagnosing sleep apnea and types of masks and machines for OSA. Includes a review of the use of types of environmental controls:  home humidity, furnaces, filters, dust mite/pet prevention, HEPA vacuums. Discussion about weather changes, air quality and the benefits of nasal washing. Instruction on Warning signs, infection symptoms, calling MD promptly, preventive modes, and value of vaccinations. Review of effective airway clearance, coughing and/or vibration techniques. Emphasizing that all should Create an Action Plan. Written material provided at class time. Flowsheet Row Pulmonary Rehab from 06/06/2024 in [facility] Card[facility] and Pulmonary Rehab  Education need identified 02/27/24  Date 04/18/24  Educator jh  Instruction Review Code 1- Verbalizes Understanding    AED/CPR: - Group verbal and written instruction with the use of models to demonstrate the basic use of the AED with the basic ABC's of resuscitation.    Tests and Procedures:  - Group verbal and visual presentation and models provide information about basic cardiac anatomy and function. Reviews the testing methods done to diagnose heart disease and the outcomes of the test results. Describes the treatment choices: Medical Management, Angioplasty, or Coronary Bypass Surgery for treating various heart conditions including Myocardial Infarction, Angina, Valve Disease, and Cardiac  Arrhythmias.  Written material provided at class time. Flowsheet Row Pulmonary Rehab from 06/06/2024 in [facility] Cardiac and Pulmonary Rehab  Date 06/06/24  Educator lc  Instruction Review Code 1- Verbalizes Understanding    Medication Safety: - Group verbal and visual instruction to review commonly prescribed medications for heart and lung disease. Reviews the medication, class of the drug, and side effects. Includes the steps to properly store meds and maintain the prescription regimen.  Written material given at graduation.   Other: -Provides group and verbal instruction on various topics (see comments)   Knowledge Questionnaire Score:  Knowledge Questionnaire Score - 06/20/24 1447       Knowledge Questionnaire Score   Pre Score 14/18    Post Score 9/18   answers reviewed with patient          Core Components/Risk Factors/Patient Goals at Admission:  Personal Goals and Risk Factors at Admission - 02/16/24 1026       Core Components/Risk Factors/Patient Goals on Admission    Weight Management Yes;Obesity;Weight Loss    Intervention Weight Management: Provide education and appropriate resources to help participant work on and attain dietary goals.;Weight Management/Obesity: Establish reasonable short term and long term weight goals.;Obesity: Provide education and appropriate resources to help participant work on and attain dietary goals.;Weight Management: Develop a combined nutrition and exercise program designed to reach desired caloric intake, while maintaining appropriate intake of nutrient and fiber, sodium and fats, and appropriate energy expenditure required for the weight goal.    Admit Weight 308 lb 12.8 oz (140.1 kg)    Goal Weight: Short Term 231 lb 12.8 oz (105.1 kg)    Goal Weight: Long Term 154 lb 6.4 oz (70 kg)    Expected Outcomes Short Term: Continue to assess and modify interventions until short term weight is achieved;Long Term: Adherence to nutrition and physical  activity/exercise program aimed toward attainment of established weight goal;Weight Loss: Understanding of general recommendations for a balanced deficit meal plan, which promotes 1-2 lb weight loss per week and includes a negative energy balance of [phone] kcal/d;Understanding recommendations for meals to include 15-35% energy as protein, 25-35% energy from fat, 35-60% energy from carbohydrates, less than 200mg  of dietary cholesterol, 20-35 gm of total fiber daily;Understanding of distribution of calorie intake throughout the day with the consumption of 4-5 meals/snacks    Improve shortness of breath with ADL's Yes    Intervention Provide education, individualized exercise plan  and daily activity instruction to help decrease symptoms of SOB with activities of daily living.    Expected Outcomes Short Term: Improve cardiorespiratory fitness to achieve a reduction of symptoms when performing ADLs;Long Term: Be able to perform more ADLs without symptoms or delay the onset of symptoms    Hypertension Yes    Intervention Provide education on lifestyle modifcations including regular physical activity/exercise, weight management, moderate sodium restriction and increased consumption of fresh fruit, vegetables, and low fat dairy, alcohol moderation, and smoking cessation.    Expected Outcomes Short Term: Continued assessment and intervention until BP is < 140/12mm HG in hypertensive participants. < 130/31mm HG in hypertensive participants with diabetes, heart failure or chronic kidney disease.;Long Term: Maintenance of blood pressure at goal levels.    Lipids Yes    Intervention Provide education and support for participant on nutrition & aerobic/resistive exercise along with prescribed medications to achieve LDL 70mg , HDL >40mg .    Expected Outcomes Short Term: Participant states understanding of desired cholesterol values and is compliant with medications prescribed. Participant is following exercise  prescription and nutrition guidelines.;Long Term: Cholesterol controlled with medications as prescribed, with individualized exercise RX and with personalized nutrition plan. Value goals: LDL < 70mg , HDL > 40 mg.          Education:Diabetes - Individual verbal and written instruction to review signs/symptoms of diabetes, desired ranges of glucose level fasting, after meals and with exercise. Acknowledge that pre and post exercise glucose checks will be done for 3 sessions at entry of program.   Know Your Numbers and Heart Failure: - Group verbal and visual instruction to discuss disease risk factors for cardiac and pulmonary disease and treatment options.  Reviews associated critical values for Overweight/Obesity, Hypertension, Cholesterol, and Diabetes.  Discusses basics of heart failure: signs/symptoms and treatments.  Introduces Heart Failure Zone chart for action plan for heart failure. Written material provided at class time.   Core Components/Risk Factors/Patient Goals Review:   Goals and Risk Factor Review     Row Name 03/26/24 1404 04/23/24 1549 05/21/24 1619 06/06/24 1422 07/09/24 1406     Core Components/Risk Factors/Patient Goals Review   Personal Goals Review Improve shortness of breath with ADL's Weight Management/Obesity;Hypertension Weight Management/Obesity;Hypertension Weight Management/Obesity;Hypertension;Lipids;Improve shortness of breath with ADL's Weight Management/Obesity;Hypertension;Lipids;Improve shortness of breath with ADL's   Review Valentine reports that she still has some shortness of breath during ADLs. Says has noticed some improvements since starting rehab. Norelle states that she would like to lose weight, with a long term weight goal of 160 lb. She has been working on weight loss through diet and exercise. She enjoys her Zumba dance class on days away from rehab. She stated that she does not own a personal BP cuff to monitor her BP cuff at home. She plans to get  her daughter's help to obtain a personal BP cuff. Jennel is still trying to lose weight and her target weight continues to be around 160lbs. We have discussed setting a month goal of 5lbs. She does not check her BP regularly at home and we discussed the importance of checking it/obtaining a BP cuff in order to check it. Delaynie reports that her weight has been consistent but she continues to work toward her weight loss goal. She has not yet gotten a blood pressure cuff to have at home but plans to get one. She does have a pulse ox. She reports that her SOB has improved since starting the program. She takes all meds and  follows up with her doctor. She does have a follow up soon to determine if she will need to go on cholesterol medicaiton or not. Shanvi continues to work towards her weight goals. She continues to make healthy diet decisions, and plans to continue her current exercise regiment outside of the program. She has not obtained a blood pressure cuff, but continues to use her pulse oximeter. She continues to see improvements in her SOB especially during ADL's. Bettis reports taking all of her medication as instructed by her doctor.   Expected Outcomes STG: Continue to attend rehab and use PLB during exertion. LTG: Manage risk factors independently Short: Continue to work on weight loss. Long: Continue to manage lifestyle risk factors. Short: Continue to work on weight loss, lose 5lbs in a month . Long: Continue to manage lifestyle risk factors. Short: follow up with doctor about cholesterol medication. Lost 5 pounds this month. Long: control cardiac riks factors. Short: Continue to take medication as instructed, obtain a blood pressure cuff. Long: Continue to work on [employer] and manage risk factors.      Core Components/Risk Factors/Patient Goals at Discharge (Final Review):   Goals and Risk Factor Review - 07/09/24 1406       Core Components/Risk Factors/Patient Goals Review   Personal Goals  Review Weight Management/Obesity;Hypertension;Lipids;Improve shortness of breath with ADL's    Review Imani continues to work towards her weight goals. She continues to make healthy diet decisions, and plans to continue her current exercise regiment outside of the program. She has not obtained a blood pressure cuff, but continues to use her pulse oximeter. She continues to see improvements in her SOB especially during ADL's. Bettis reports taking all of her medication as instructed by her doctor.    Expected Outcomes Short: Continue to take medication as instructed, obtain a blood pressure cuff. Long: Continue to work on [employer] and manage risk factors.          ITP Comments:  ITP Comments     Row Name 02/13/24 1402 02/16/24 1014 02/27/24 1412 02/29/24 0831 03/28/24 0911   ITP Comments Initial phone call completed. Diagnosis can [facility] in [facility]. EP Orientation scheduled for February 16, 2024 @ 0800. Completed and gym orientation for respiratory care services. Initial ITP created and sent for review to Dr. Faud Aleskerov, Medical Director. First full day of exercise!  Patient was oriented to gym and equipment including functions, settings, policies, and procedures.  Patient's individual exercise prescription and treatment plan were reviewed.  All starting workloads were established based on the results of the 6 minute walk test done at initial orientation visit.  The plan for exercise progression was also introduced and progression will be customized based on patient's performance and goals. 30 Day review completed. Medical Director ITP review done, changes made as directed, and signed approval by Medical Director. new to program. 30 Day review completed. Medical Director ITP review done; changes made as directed and signed approval by Medical Director.    Row Name 04/25/24 1151 05/23/24 0954 06/20/24 1139 07/11/24 1354     ITP Comments 30 Day review completed. Medical Director ITP  review done; changes made as directed and signed approval by Medical Director. 30 Day review completed. Medical Director ITP review done; changes made as directed and signed approval by Medical Director. 30 Day review completed. Medical Director ITP review done; changes made as directed and signed approval by Medical Director. Shanitha graduated today from  rehab with 36  sessions completed.  Details of the patient's exercise prescription and what She needs to do in order to continue the prescription and progress were discussed with patient.  Patient was given a copy of prescription and goals.  Patient verbalized understanding. Karyssa plans to continue to exercise by attending group fitness classes and walking and biking at home.       Comments: Discharge ITP

## 2024-07-12 ENCOUNTER — Telehealth: Payer: Self-pay | Admitting: Pharmacist

## 2024-07-12 NOTE — Telephone Encounter (Signed)
 Appeal has been submitted. Will advise when response is received, please be advised that most companies may take 30 days to make a decision. Appeal letter and supporting documentation have been faxed to [phone] on 07/12/2024 @3 :01 pm.  Thank you, Devere Pandy, PharmD Clinical Pharmacist  [place]  Direct Dial: [phone]

## 2024-07-13 NOTE — Telephone Encounter (Signed)
 Noted and will await further update.

## 2024-07-16 ENCOUNTER — Encounter

## 2024-07-18 ENCOUNTER — Encounter: Payer: Self-pay | Admitting: Nurse Practitioner

## 2024-07-18 NOTE — Telephone Encounter (Signed)
 Per insurance, the appeal for Zepbound  was denied.  Full letter has been uploaded under the media tab.    The appeal letter clearly stated:  patient is not experiencing daytime sleepiness. Because of this, the formulary alternatives, modafinil and armodafinil, are not clinically appropriate. These medications are used only for excessive daytime sleepiness in treated OSA and have no therapeutic effect on airway obstruction, weight-related disease burden, or OSA severity.  Ms. Birdsell obesity significantly worsens both her OSA and her cardiopulmonary disease. Weight loss is a first-line, evidence-based treatment for OSA, known to reduce apnea episodes, improve oxygenation, decrease snoring, and lessen cardiometabolic strain. For a patient with coexisting interstitial lung disease, hypertension, hyperlipidemia, and prediabetes, the risks associated with uncontrolled obesity and untreated OSA are particularly severe-- including increased risk of respiratory decline, cardiovascular events, impaired glucose control, and reduced quality of life.

## 2024-07-19 ENCOUNTER — Telehealth: Payer: Self-pay | Admitting: Pharmacist

## 2024-07-19 NOTE — Telephone Encounter (Signed)
 All information and supporting documents have been faxed to [phone] on 07/19/2024 @11 :30 am.  Thank you, Devere Pandy, PharmD Clinical Pharmacist  [place]  Direct Dial: [phone]

## 2024-07-24 NOTE — Telephone Encounter (Signed)
 Noted

## 2024-07-24 NOTE — Telephone Encounter (Signed)
 Insurance has denied the 2nd level appeal for Zepbound , full letter can be found under the media tab.

## 2024-08-09 NOTE — Progress Notes (Unsigned)
 SABRA

## 2024-08-09 NOTE — Patient Instructions (Incomplete)

## 2024-08-09 NOTE — Progress Notes (Unsigned)
 "   PATIENT: Erin Contreras Contreras DOB: 08/01/1961  REASON FOR VISIT: follow up HISTORY FROM: patient  No chief complaint on file.    HISTORY OF PRESENT ILLNESS:  08/09/2024 ALL:  Erin Contreras Contreras is a 64 y.o. female here today for follow up for OSA on CPAP.  She was seen in consult with Dr Erin Contreras 03/2024. She had previously been diagnosed with sleep apnea and advised to start CPAP. PSG showed moderate OSA with AHI 27.9 and O2 nadir of 81%. AutoPAP advised. Since,       HISTORY: (copied from Dr Erin Contreras previous note)  Dear Erin Contreras Contreras,   I saw your patient, Erin Contreras Contreras, upon your kind request in my sleep clinic today for initial consultation of her sleep disorder, in particular, evaluation of her prior diagnosis of obstructive sleep apnea.  The patient is accompanied by her daughter today.  As you know, Erin Contreras Contreras is a 64 year old female with an underlying medical history of rheumatoid arthritis, low back pain, osteoarthritis, with status post bilateral knee replacement surgeries, hypertension, prediabetes, reflux disease, overactive bladder, and severe obesity with a BMI of over 45, s/p sleep gastrectomy in 2016, who was previously diagnosed with obstructive sleep apnea recommended to start PAP therapy.  Her Epworth sleepiness score is 10 out of 24, fatigue severity score is 16 out of 63.  I reviewed your office note from 12/06/2023.  Prior sleep study results are not available for my review today.  Sleep testing was over 9 years ago. She has not been on PAP therapy in years, reports having a CPAP machine in the past but stopped using it.  As she recalls, she had mild sleep apnea at the time.  She is working on weight loss.  Her mother had sleep apnea.  She gained most of her weight back after her bariatric surgery.  She has a bedtime of around 9 and rise time between 6 and 7 AM but she does not sleep well at night.  She naps during the day.  She drinks caffeine in the form of soda, usually 1 can per day.   She lives alone, she is single, no pets in the household.  She does watch TV in her bedroom and sometimes falls asleep with the TV on.  She is a non-smoker.  She does not drink any alcohol.  She would be willing to get retested and consider PAP therapy again.  She has occasionally woken up with a headache which is typically self-limited.  She has nocturia about once per average night.  She has woken up with a sore jaw and sore throat.   REVIEW OF SYSTEMS: Out of a complete 14 system review of symptoms, the patient complains only of the following symptoms, and all other reviewed systems are negative.  ESS:  ALLERGIES: Allergies[1]  HOME MEDICATIONS: Outpatient Medications Prior to Visit  Medication Sig Dispense Refill   acyclovir  (ZOVIRAX ) 400 MG tablet Take 1 tablet (400 mg total) by mouth 2 (two) times daily as needed. 180 tablet 4   albuterol  (VENTOLIN  HFA) 108 (90 Base) MCG/ACT inhaler Inhale 2 puffs into the lungs every 6 (six) hours as needed for wheezing or shortness of breath. 18 g 2   Cholecalciferol  1.25 MG (50000 UT) TABS Take 1 tablet by mouth once a week. 12 tablet 4   conjugated estrogens  (PREMARIN ) vaginal cream Place 0.25 Applicatorfuls vaginally at bedtime for 14 days, THEN 0.25 Applicatorfuls every other day for 14 days, THEN 0.25 Applicatorfuls 2 (two) times  a week. 42.5 g 12   DULoxetine  (CYMBALTA ) 20 MG capsule Take 2 capsules (40 mg total) by mouth every morning. 60 capsule 2   famotidine  (PEPCID ) 40 MG tablet Take 1 tablet (40 mg total) by mouth at bedtime as needed for heartburn or indigestion. 90 tablet 3   folic acid  (FOLVITE ) 1 MG tablet Take 1 mg by mouth daily.     hydroxychloroquine (PLAQUENIL) 200 MG tablet Take 200 mg by mouth 2 (two) times daily.     meloxicam  (MOBIC ) 15 MG tablet Take 15 mg by mouth daily.     methotrexate  (RHEUMATREX) 2.5 MG tablet Take 20 mg by mouth once a week.     pantoprazole  (PROTONIX ) 40 MG tablet Take 1 tablet (40 mg total) by mouth 2  (two) times daily. 180 tablet 3   pregabalin  (LYRICA ) 150 MG capsule Take 1 capsule (150 mg total) by mouth at bedtime. 30 capsule 5   Tocilizumab (ACTEMRA ACTPEN) 162 MG/0.9ML SOAJ Inject into the skin.     vitamin B-12 (CYANOCOBALAMIN ) 100 MCG tablet Take 100 mcg by mouth daily.     No facility-administered medications prior to visit.    PAST MEDICAL HISTORY: Past Medical History:  Diagnosis Date   Boils    Ear mass    GERD (gastroesophageal reflux disease)    Hemorrhoids    Herpes simplex virus infection    HSV infection    Lumbago with sciatica    OA (osteoarthritis) of knee    Obesity    Overactive bladder    PONV (postoperative nausea and vomiting)    Pre-diabetes    RA (rheumatoid arthritis) (HCC)    Sleep apnea    does not use cpap-had bariatric surgery and has no issues since surgery   Urge and stress incontinence     PAST SURGICAL HISTORY: Past Surgical History:  Procedure Laterality Date   CHOLECYSTECTOMY  2011   COLONOSCOPY N/A 03/19/2021   Procedure: COLONOSCOPY;  Surgeon: Erin Contreras Bi, MD;  Location: [facility] ENDOSCOPY;  Service: Gastroenterology;  Laterality: N/A;   ESOPHAGOGASTRODUODENOSCOPY (EGD) WITH PROPOFOL  N/A 02/07/2023   Procedure: ESOPHAGOGASTRODUODENOSCOPY (EGD) WITH PROPOFOL ;  Surgeon: Erin Contreras Bi, MD;  Location: [facility] ENDOSCOPY;  Service: Gastroenterology;  Laterality: N/A;   FRACTURE SURGERY Right    ankle as a child   JOINT REPLACEMENT Left 01/28/2021   KNEE ARTHROPLASTY Right 07/11/2017   Procedure: COMPUTER ASSISTED TOTAL KNEE ARTHROPLASTY;  Surgeon: Erin Contreras Lynwood SQUIBB, MD;  Location: [facility] ORS;  Service: Orthopedics;  Laterality: Right;   KNEE ARTHROPLASTY Left 01/28/2021   Procedure: COMPUTER ASSISTED TOTAL KNEE ARTHROPLASTY;  Surgeon: Erin Contreras Lynwood SQUIBB, MD;  Location: [facility] ORS;  Service: Orthopedics;  Laterality: Left;   SLEEVE GASTROPLASTY     TUMOR REMOVAL Right 2010   ear    FAMILY HISTORY: Family History  Problem Relation Age of Onset    Cancer Mother        liver   Diabetes Mother    Hyperlipidemia Mother    Hypertension Mother    Cirrhosis Father        liver   Hypertension Daughter    Stroke Maternal Grandmother    Stroke Maternal Grandfather    Heart disease Paternal Grandmother    Diabetes Paternal Grandmother    Heart disease Paternal Grandfather    Diabetes Paternal Grandfather     SOCIAL HISTORY: Social History   Socioeconomic History   Marital status: Single    Spouse name: Not on file   Number of children: Not  on file   Years of education: Not on file   Highest education level: Not on file  Occupational History   Not on file  Tobacco Use   Smoking status: Never   Smokeless tobacco: Never  Vaping Use   Vaping status: Never Used  Substance and Sexual Activity   Alcohol use: No   Drug use: No   Sexual activity: Not Currently  Other Topics Concern   Not on file  Social History Narrative   Caffiene soda occasional   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    Social Drivers of Health   Tobacco Use: Low Risk (06/11/2024)   Patient History    Smoking Tobacco Use: Never    Smokeless Tobacco Use: Never    Passive Exposure: Not on file  Financial Resource Strain: Medium Risk (12/06/2023)   Overall Financial Resource Strain (CARDIA)    Difficulty of Paying Living Expenses: Somewhat hard  Food Insecurity: Food Insecurity Present (12/06/2023)   Hunger Vital Sign    Worried About Running Out of Food in the Last Year: Often true    Ran Out of Food in the Last Year: Often true  Transportation Needs: No Transportation Needs (12/06/2023)   PRAPARE - Administrator, Civil Service (Medical): No    Lack of Transportation (Non-Medical): No  Physical Activity: Insufficiently Active (12/06/2023)   Exercise Vital Sign    Days of Exercise per Week: 2 days    Minutes of Exercise per Session: 20 min  Stress: No Stress Concern Present (12/06/2023)   [employer] of Occupational Health - Occupational Stress  Questionnaire    Feeling of Stress : Only a little  Social Connections: Moderately Isolated (12/06/2023)   Social Connection and Isolation Panel    Frequency of Communication with Friends and Family: More than three times a week    Frequency of Social Gatherings with Friends and Family: More than three times a week    Attends Religious Services: More than 4 times per year    Active Member of [employer] or Organizations: No    Attends Banker Meetings: Never    Marital Status: Never married  Intimate Partner Violence: Not At Risk (05/29/2024)   Received from [facility]   Epic    Within the last year, have you been afraid of your partner or ex-partner?: No    Within the last year, have you been humiliated or emotionally abused in other ways by your partner or ex-partner?: No    Within the last year, have you been kicked, hit, slapped, or otherwise physically hurt by your partner or ex-partner?: No    Within the last year, have you been raped or forced to have any kind of sexual activity by your partner or ex-partner?: No  Depression (PHQ2-9): Low Risk (06/20/2024)   Depression (PHQ2-9)    PHQ-2 Score: 4  Recent Concern: Depression (PHQ2-9) - Medium Risk (06/11/2024)   Depression (PHQ2-9)    PHQ-2 Score: 6  Alcohol Screen: Low Risk (12/06/2023)   Alcohol Screen    Last Alcohol Screening Score (AUDIT): 0  Housing: Low Risk (12/06/2023)   Housing Stability Vital Sign    Unable to Pay for Housing in the Last Year: No    Number of Times Moved in the Last Year: 0    Homeless in the Last Year: No  Utilities: Not At Risk (12/06/2023)   AHC Utilities    Threatened with loss of utilities: No  Health Literacy: Adequate  Health Literacy (12/06/2023)   B1300 Health Literacy    Frequency of need for help with medical instructions: Never     PHYSICAL EXAM  There were no vitals filed for this visit. There is no height or weight on file to calculate BMI.  Generalized: Well developed, in  no acute distress  Cardiology: normal rate and rhythm, no murmur noted Respiratory: clear to auscultation bilaterally  Neurological examination  Mentation: Alert oriented to time, place, history taking. Follows all commands speech and language fluent Cranial nerve II-XII: Pupils were equal round reactive to light. Extraocular movements were full, visual field were full  Motor: The motor testing reveals 5 over 5 strength of all 4 extremities. Good symmetric motor tone is noted throughout.  Gait and station: Gait is normal.    DIAGNOSTIC DATA (LABS, IMAGING, TESTING) - I reviewed patient records, labs, notes, testing and imaging myself where available.      No data to display           Lab Results  Component Value Date   WBC 4.4 12/06/2023   HGB 13.2 12/06/2023   HCT 40.2 12/06/2023   MCV 92 12/06/2023   PLT 243 12/06/2023      Component Value Date/Time   NA 142 06/11/2024 1006   NA 138 09/04/2014 0452   K 4.2 06/11/2024 1006   K 3.9 09/04/2014 0452   CL 107 (H) 06/11/2024 1006   CL 105 09/04/2014 0452   CO2 22 06/11/2024 1006   CO2 27 09/04/2014 0452   GLUCOSE 101 (H) 06/11/2024 1006   GLUCOSE 108 (H) 01/05/2022 1035   GLUCOSE 109 (H) 09/04/2014 0452   BUN 11 06/11/2024 1006   BUN 7 09/04/2014 0452   CREATININE 0.75 06/11/2024 1006   CREATININE 0.69 09/04/2014 0452   CALCIUM 9.1 06/11/2024 1006   CALCIUM 8.8 09/04/2014 0452   PROT 6.4 06/11/2024 1006   PROT 7.5 05/10/2014 0938   ALBUMIN 4.0 06/11/2024 1006   ALBUMIN 3.0 (L) 09/04/2014 0452   AST 19 06/11/2024 1006   AST 25 05/10/2014 0938   ALT 23 06/11/2024 1006   ALT 29 05/10/2014 0938   ALKPHOS 88 06/11/2024 1006   ALKPHOS 84 05/10/2014 0938   BILITOT 0.6 06/11/2024 1006   BILITOT 0.4 05/10/2014 0938   GFRNONAA >60 01/05/2022 1035   GFRNONAA >60 09/04/2014 0452   GFRAA 102 06/12/2020 1208   GFRAA >60 09/04/2014 0452   Lab Results  Component Value Date   CHOL 230 (H) 06/11/2024   HDL 66 06/11/2024    LDLCALC 135 (H) 06/11/2024   TRIG 163 (H) 06/11/2024   Lab Results  Component Value Date   HGBA1C 5.8 (H) 06/11/2024   Lab Results  Component Value Date   VITAMINB12 497 12/06/2023   Lab Results  Component Value Date   TSH 1.200 12/06/2023     ASSESSMENT AND PLAN 64 y.o. year old female  has a past medical history of Boils, Ear mass, GERD (gastroesophageal reflux disease), Hemorrhoids, Herpes simplex virus infection, HSV infection, Lumbago with sciatica, OA (osteoarthritis) of knee, Obesity, Overactive bladder, PONV (postoperative nausea and vomiting), Pre-diabetes, RA (rheumatoid arthritis) (HCC), Sleep apnea, and Urge and stress incontinence. here with   No diagnosis found.    Erin Contreras Contreras is doing well on CPAP therapy. Compliance report reveals ***. *** was encouraged to continue using CPAP nightly and for greater than 4 hours each night. We will update supply orders as indicated. Risks of untreated sleep apnea review  and education materials provided. Healthy lifestyle habits encouraged. *** will follow up in ***, sooner if needed. *** verbalizes understanding and agreement with this plan.    No orders of the defined types were placed in this encounter.    No orders of the defined types were placed in this encounter.     Greig Forbes, FNP-C 08/09/2024, 9:30 PM [facility] [address] [place], [place] [address] [phone]      [1] No Known Allergies  "

## 2024-08-13 ENCOUNTER — Ambulatory Visit: Admitting: Family Medicine

## 2024-08-13 ENCOUNTER — Encounter: Payer: Self-pay | Admitting: Family Medicine

## 2024-08-13 DIAGNOSIS — G4733 Obstructive sleep apnea (adult) (pediatric): Secondary | ICD-10-CM

## 2024-08-30 NOTE — Progress Notes (Unsigned)
 "   [facility] [address] [place] [address] [phone]       OFFICE FOLLOW UP NOTE  [name] Date of Birth:  03-08-1961 Medical Record Number:  [id number]    Primary neurologist: Dr. Buck Reason for visit: Initial CPAP follow-up    SUBJECTIVE:   CHIEF COMPLAINT:  No chief complaint on file.   Follow-up visit:  Prior visit: 03/08/2024 with Dr. Buck  Brief HPI:   Erin Contreras is a 64 y.o. female who was evaluated by Dr. Buck in 03/2024 for prior diagnosis of sleep apnea with sleep study completed over 9 years ago and eventually stopped using PAP therapy.  ESS 10/24.  Sleep study 04/2024 showed overall moderate OSA with total AHI 27.8/h and O2 nadir of 81%.  She did not achieve REM sleep so results likely underestimate degree of apnea.  CPAP therapy initiated 05/2024.       Interval history:        ROS:   14 system review of systems performed and negative with exception of those listed in HPI  PMH:  Past Medical History:  Diagnosis Date   Boils    Ear mass    GERD (gastroesophageal reflux disease)    Hemorrhoids    Herpes simplex virus infection    HSV infection    Lumbago with sciatica    OA (osteoarthritis) of knee    Obesity    Overactive bladder    PONV (postoperative nausea and vomiting)    Pre-diabetes    RA (rheumatoid arthritis) (HCC)    Sleep apnea    does not use cpap-had bariatric surgery and has no issues since surgery   Urge and stress incontinence     PSH:  Past Surgical History:  Procedure Laterality Date   CHOLECYSTECTOMY  2011   COLONOSCOPY N/A 03/19/2021   Procedure: COLONOSCOPY;  Surgeon: Therisa Bi, MD;  Location: [facility] ENDOSCOPY;  Service: Gastroenterology;  Laterality: N/A;   ESOPHAGOGASTRODUODENOSCOPY (EGD) WITH PROPOFOL  N/A 02/07/2023   Procedure: ESOPHAGOGASTRODUODENOSCOPY (EGD) WITH PROPOFOL ;  Surgeon: Therisa Bi, MD;  Location: [facility] ENDOSCOPY;  Service: Gastroenterology;   Laterality: N/A;   FRACTURE SURGERY Right    ankle as a child   JOINT REPLACEMENT Left 01/28/2021   KNEE ARTHROPLASTY Right 07/11/2017   Procedure: COMPUTER ASSISTED TOTAL KNEE ARTHROPLASTY;  Surgeon: Mardee Lynwood SQUIBB, MD;  Location: [facility] ORS;  Service: Orthopedics;  Laterality: Right;   KNEE ARTHROPLASTY Left 01/28/2021   Procedure: COMPUTER ASSISTED TOTAL KNEE ARTHROPLASTY;  Surgeon: Mardee Lynwood SQUIBB, MD;  Location: [facility] ORS;  Service: Orthopedics;  Laterality: Left;   SLEEVE GASTROPLASTY     TUMOR REMOVAL Right 2010   ear    Social History:  Social History   Socioeconomic History   Marital status: Single    Spouse name: Not on file   Number of children: Not on file   Years of education: Not on file   Highest education level: Not on file  Occupational History   Not on file  Tobacco Use   Smoking status: Never   Smokeless tobacco: Never  Vaping Use   Vaping status: Never Used  Substance and Sexual Activity   Alcohol use: No   Drug use: No   Sexual activity: Not Currently  Other Topics Concern   Not on file  Social History Narrative   Caffiene soda occasional   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    Social Drivers of Health   Tobacco Use:  Low Risk (06/11/2024)   Patient History    Smoking Tobacco Use: Never    Smokeless Tobacco Use: Never    Passive Exposure: Not on file  Financial Resource Strain: Medium Risk (12/06/2023)   Overall Financial Resource Strain (CARDIA)    Difficulty of Paying Living Expenses: Somewhat hard  Food Insecurity: Food Insecurity Present (12/06/2023)   Hunger Vital Sign    Worried About Running Out of Food in the Last Year: Often true    Ran Out of Food in the Last Year: Often true  Transportation Needs: No Transportation Needs (12/06/2023)   PRAPARE - Administrator, Civil Service (Medical): No    Lack of Transportation (Non-Medical): No  Physical Activity: Insufficiently Active (12/06/2023)   Exercise Vital Sign    Days of Exercise per  Week: 2 days    Minutes of Exercise per Session: 20 min  Stress: No Stress Concern Present (12/06/2023)   [employer] of Occupational Health - Occupational Stress Questionnaire    Feeling of Stress : Only a little  Social Connections: Moderately Isolated (12/06/2023)   Social Connection and Isolation Panel    Frequency of Communication with Friends and Family: More than three times a week    Frequency of Social Gatherings with Friends and Family: More than three times a week    Attends Religious Services: More than 4 times per year    Active Member of [employer] or Organizations: No    Attends Banker Meetings: Never    Marital Status: Never married  Intimate Partner Violence: Not At Risk (05/29/2024)   Received from [facility]   Epic    Within the last year, have you been afraid of your partner or ex-partner?: No    Within the last year, have you been humiliated or emotionally abused in other ways by your partner or ex-partner?: No    Within the last year, have you been kicked, hit, slapped, or otherwise physically hurt by your partner or ex-partner?: No    Within the last year, have you been raped or forced to have any kind of sexual activity by your partner or ex-partner?: No  Depression (PHQ2-9): Low Risk (06/20/2024)   Depression (PHQ2-9)    PHQ-2 Score: 4  Recent Concern: Depression (PHQ2-9) - Medium Risk (06/11/2024)   Depression (PHQ2-9)    PHQ-2 Score: 6  Alcohol Screen: Low Risk (12/06/2023)   Alcohol Screen    Last Alcohol Screening Score (AUDIT): 0  Housing: Low Risk (12/06/2023)   Housing Stability Vital Sign    Unable to Pay for Housing in the Last Year: No    Number of Times Moved in the Last Year: 0    Homeless in the Last Year: No  Utilities: Not At Risk (12/06/2023)   AHC Utilities    Threatened with loss of utilities: No  Health Literacy: Adequate Health Literacy (12/06/2023)   B1300 Health Literacy    Frequency of need for help with medical  instructions: Never    Family History:  Family History  Problem Relation Age of Onset   Cancer Mother        liver   Diabetes Mother    Hyperlipidemia Mother    Hypertension Mother    Cirrhosis Father        liver   Hypertension Daughter    Stroke Maternal Grandmother    Stroke Maternal Grandfather    Heart disease Paternal Grandmother    Diabetes Paternal Grandmother  Heart disease Paternal Grandfather    Diabetes Paternal Grandfather     Medications:  Medications Ordered Prior to Encounter[1]  Allergies:  Allergies[2]    OBJECTIVE:  Physical Exam  There were no vitals filed for this visit. There is no height or weight on file to calculate BMI. No results found.   General: well developed, well nourished, seated, in no evident distress Head: head normocephalic and atraumatic.   Neck: supple with no carotid or supraclavicular bruits Cardiovascular: regular rate and rhythm, no murmurs  Neurologic Exam Mental Status: Awake and fully alert. Oriented to place and time. Recent and remote memory intact. Attention span, concentration and fund of knowledge appropriate. Mood and affect appropriate.  Cranial Nerves: Pupils equal, briskly reactive to light. Extraocular movements full without nystagmus. Visual fields full to confrontation. Hearing intact. Facial sensation intact. Face, tongue, palate moves normally and symmetrically.  Motor: Normal bulk and tone. Normal strength in all tested extremity muscles Gait and Station: Arises from chair without difficulty. Stance is normal. Gait demonstrates normal stride length and balance without use of AD.         ASSESSMENT/PLAN: Erin Contreras is a 64 y.o. year old female    OSA on CPAP :  Compliance report shows satisfactory usage with optimal residual AHI.   Continue current pressure settings 7-13 with EPR 1 Discussed continued nightly usage with ensuring greater than 4 hours nightly for optimal benefit and per  insurance purposes.   Continue to follow with DME company [employer] for any needed supplies or CPAP related concerns CPAP set up 05/2024     Follow up in *** or call earlier if needed   CC:  PCP: Valerio Moris T, NP    I personally spent a total of *** minutes in the care of the patient today including {Time Based Coding:210964241}.     Harlene Bogaert, AGNP-BC  [facility] [address] [place], [place] [address]  Phone [phone] Fax [phone] Note: This document was prepared with digital dictation and possible smart phrase technology. Any transcriptional errors that result from this process are unintentional.           [1]  Current Outpatient Medications on File Prior to Visit  Medication Sig Dispense Refill   acyclovir  (ZOVIRAX ) 400 MG tablet Take 1 tablet (400 mg total) by mouth 2 (two) times daily as needed. 180 tablet 4   albuterol  (VENTOLIN  HFA) 108 (90 Base) MCG/ACT inhaler Inhale 2 puffs into the lungs every 6 (six) hours as needed for wheezing or shortness of breath. 18 g 2   Cholecalciferol  1.25 MG (50000 UT) TABS Take 1 tablet by mouth once a week. 12 tablet 4   conjugated estrogens  (PREMARIN ) vaginal cream Place 0.25 Applicatorfuls vaginally at bedtime for 14 days, THEN 0.25 Applicatorfuls every other day for 14 days, THEN 0.25 Applicatorfuls 2 (two) times a week. 42.5 g 12   DULoxetine  (CYMBALTA ) 20 MG capsule Take 2 capsules (40 mg total) by mouth every morning. 60 capsule 2   famotidine  (PEPCID ) 40 MG tablet Take 1 tablet (40 mg total) by mouth at bedtime as needed for heartburn or indigestion. 90 tablet 3   folic acid  (FOLVITE ) 1 MG tablet Take 1 mg by mouth daily.     hydroxychloroquine (PLAQUENIL) 200 MG tablet Take 200 mg by mouth 2 (two) times daily.     meloxicam  (MOBIC ) 15 MG tablet Take 15 mg by mouth daily.     methotrexate  (RHEUMATREX) 2.5 MG  tablet Take 20 mg by mouth once a week.     pantoprazole   (PROTONIX ) 40 MG tablet Take 1 tablet (40 mg total) by mouth 2 (two) times daily. 180 tablet 3   pregabalin  (LYRICA ) 150 MG capsule Take 1 capsule (150 mg total) by mouth at bedtime. 30 capsule 5   Tocilizumab (ACTEMRA ACTPEN) 162 MG/0.9ML SOAJ Inject into the skin.     vitamin B-12 (CYANOCOBALAMIN ) 100 MCG tablet Take 100 mcg by mouth daily.     No current facility-administered medications on file prior to visit.  [2] No Known Allergies  "

## 2024-09-03 ENCOUNTER — Ambulatory Visit: Admitting: Adult Health

## 2024-09-05 ENCOUNTER — Encounter: Payer: Self-pay | Admitting: Adult Health

## 2024-09-05 ENCOUNTER — Ambulatory Visit: Admitting: Adult Health

## 2024-09-05 VITALS — BP 131/80 | HR 80 | Ht 69.0 in | Wt 311.8 lb

## 2024-09-05 DIAGNOSIS — G4733 Obstructive sleep apnea (adult) (pediatric): Secondary | ICD-10-CM | POA: Diagnosis not present

## 2024-09-05 DIAGNOSIS — Z789 Other specified health status: Secondary | ICD-10-CM | POA: Diagnosis not present

## 2024-09-05 NOTE — Progress Notes (Signed)
 SABRA

## 2024-09-05 NOTE — Patient Instructions (Addendum)
 Your Plan:  Continue nightly use of CPAP with ensuring greater than 4 hours per night for optimal benefit and per insurance requirements  You will be contacted by [employer] to try a different mask for better tolerance   Continue to follow with your DME company [employer] for any needed supplies or CPAP related concerns         Thank you for coming to see us  at [facility]. I hope we have been able to provide you high quality care today.  You may receive a patient satisfaction survey over the next few weeks. We would appreciate your feedback and comments so that we may continue to improve ourselves and the health of our patients.

## 2024-09-06 ENCOUNTER — Encounter: Payer: Self-pay | Admitting: Nurse Practitioner

## 2024-09-06 ENCOUNTER — Ambulatory Visit (INDEPENDENT_AMBULATORY_CARE_PROVIDER_SITE_OTHER): Admitting: Nurse Practitioner

## 2024-09-06 VITALS — BP 117/82 | HR 87 | Temp 97.4°F | Ht 69.0 in | Wt 311.6 lb

## 2024-09-06 DIAGNOSIS — G4733 Obstructive sleep apnea (adult) (pediatric): Secondary | ICD-10-CM | POA: Diagnosis not present

## 2024-09-06 DIAGNOSIS — R8281 Pyuria: Secondary | ICD-10-CM | POA: Diagnosis not present

## 2024-09-06 DIAGNOSIS — E66813 Obesity, class 3: Secondary | ICD-10-CM

## 2024-09-06 DIAGNOSIS — Z6841 Body Mass Index (BMI) 40.0 and over, adult: Secondary | ICD-10-CM | POA: Diagnosis not present

## 2024-09-06 DIAGNOSIS — N898 Other specified noninflammatory disorders of vagina: Secondary | ICD-10-CM | POA: Diagnosis not present

## 2024-09-06 LAB — URINALYSIS, ROUTINE W REFLEX MICROSCOPIC
Glucose, UA: NEGATIVE
Leukocytes,UA: NEGATIVE
Nitrite, UA: NEGATIVE
Protein,UA: NEGATIVE
RBC, UA: NEGATIVE
Specific Gravity, UA: >1.03 — ABNORMAL HIGH (ref 1.005–1.030)
Urobilinogen, Ur: 1 mg/dL (ref 0.2–1.0)
pH, UA: 5.5 (ref 5.0–7.5)

## 2024-09-06 LAB — WET PREP FOR TRICH, YEAST, CLUE
Clue Cell Exam: NEGATIVE
Trichomonas Exam: NEGATIVE
Yeast Exam: NEGATIVE

## 2024-09-06 LAB — MICROSCOPIC EXAMINATION
Bacteria, UA: NONE SEEN
Epithelial Cells (non renal): >10 /HPF — AB (ref 0–10)
RBC, Urine: NONE SEEN /HPF (ref 0–2)
WBC, UA: NONE SEEN /HPF (ref 0–5)

## 2024-09-06 MED ORDER — ZEPBOUND 2.5 MG/0.5ML ~~LOC~~ SOAJ: 2.5000 mg | SUBCUTANEOUS | 4 refills | Status: AC

## 2024-09-06 NOTE — Patient Instructions (Signed)
 Healthy Eating for Adults Healthy eating may help you get and keep a healthy body weight, reduce the risk of chronic disease, and live a long and productive life. It is important to follow a healthy eating pattern. Your nutritional and calorie needs should be met mainly by different nutrient-rich foods. What are tips for following this plan? Reading food labels Read labels and choose the following: Reduced or low sodium products. Juices with 100% fruit juice. Foods with low saturated fats (<3 g per serving) and high polyunsaturated and monounsaturated fats. Foods with whole grains, such as whole wheat, cracked wheat, brown rice, and wild rice. Whole grains that are fortified with folic acid. This is recommended for females who are pregnant or who want to become pregnant. Read labels and do not eat or drink the following: Foods or drinks with added sugars. These include foods that contain brown sugar, corn sweetener, corn syrup, dextrose, fructose, glucose, high-fructose corn syrup, honey, invert sugar, lactose, malt syrup, maltose, molasses, raw sugar, sucrose, trehalose, or turbinado sugar. Limit your intake of added sugars to less than 10% of your total daily calories. Do not eat more than the following amounts of added sugar per day: 6 teaspoons (25 g) for females. 9 teaspoons (38 g) for males. Foods that contain processed or refined starches and grains. Refined grain products, such as white flour, degermed cornmeal, white bread, and white rice. Shopping Choose nutrient-rich snacks, such as vegetables, whole fruits, and nuts. Avoid high-calorie and high-sugar snacks, such as potato chips, fruit snacks, and candy. Use oil-based dressings and spreads on foods instead of solid fats such as butter, margarine, sour cream, or cream cheese. Limit pre-made sauces, mixes, and instant products such as flavored rice, instant noodles, and ready-made pasta. Try more plant-protein sources, such as tofu,  tempeh, black beans, edamame, lentils, nuts, and seeds. Explore eating plans such as the Mediterranean diet or vegetarian diet. Try heart-healthy dips made with beans and healthy fats like hummus and guacamole. Vegetables go great with these. Cooking Use oil to saut or stir-fry foods instead of solid fats such as butter, margarine, or lard. Try baking, boiling, grilling, or broiling instead of frying. Remove the fatty part of meats before cooking. Steam vegetables in water or broth. Meal planning  At meals, imagine dividing your plate into fourths: One-half of your plate is fruits and vegetables. One-fourth of your plate is whole grains. One-fourth of your plate is protein, especially lean meats, poultry, eggs, tofu, beans, or nuts. Include low-fat dairy as part of your daily diet. Lifestyle Choose healthy options in all settings, including home, work, school, restaurants, or stores. Prepare your food safely: Wash your hands after handling raw meats. Where you prepare food, keep surfaces clean by regularly washing with hot, soapy water. Keep raw meats separate from ready-to-eat foods, such as fruits and vegetables. Cook seafood, meat, poultry, and eggs to the recommended temperature. Get a food thermometer. Store foods at safe temperatures. In general: Keep cold foods at 28F (4.4C) or below. Keep hot foods at 128F (60C) or above. Keep your freezer at [facility] (-17.8C) or below. Foods are not safe to eat if they have been between the temperatures of 40-128F (4.4-60C) for more than 2 hours. What foods should I eat? Fruits Aim to eat 1-2 cups of fresh, canned (in natural juice), or frozen fruits each day. One cup of fruit equals 1 small apple, 1 large banana, 8 large strawberries, 1 cup (237 g) canned fruit,  cup (82 g) dried  fruit, or 1 cup (240 mL) 100% juice. Vegetables Aim to eat 2-4 cups of fresh and frozen vegetables each day, including different varieties and colors. One cup  of vegetables equals 1 cup (91 g) broccoli or cauliflower florets, 2 medium carrots, 2 cups (150 g) raw, leafy greens, 1 large tomato, 1 large bell pepper, 1 large sweet potato, or 1 medium white potato. Grains Aim to eat 5-10 ounce-equivalents of whole grains each day. Examples of 1 ounce-equivalent of grains include 1 slice of bread, 1 cup (40 g) ready-to-eat cereal, 3 cups (24 g) popcorn, or  cup (93 g) cooked rice. Meats and other proteins Try to eat 5-7 ounce-equivalents of protein each day. Examples of 1 ounce-equivalent of protein include 1 egg,  oz nuts (12 almonds, 24 pistachios, or 7 walnut halves), 1/4 cup (90 g) cooked beans, 6 tablespoons (90 g) hummus or 1 tablespoon (16 g) peanut butter. A cut of meat or fish that is the size of a deck of cards is about 3-4 ounce-equivalents (85 g). Of the protein you eat each week, try to have at least 8 sounce (227 g) of seafood. This is about 2 servings per week. This includes salmon, trout, herring, sardines, and anchovies. Dairy Aim to eat 3 cup-equivalents of fat-free or low-fat dairy each day. Examples of 1 cup-equivalent of dairy include 1 cup (240 mL) milk, 8 ounces (250 g) yogurt, 1 ounces (44 g) natural cheese, or 1 cup (240 mL) fortified soy milk. Fats and oils Aim for about 5 teaspoons (21 g) of fats and oils per day. Choose monounsaturated fats, such as canola and olive oils, mayonnaise made with olive oil or avocado oil, avocados, peanut butter, and most nuts, or polyunsaturated fats, such as sunflower, corn, and soybean oils, walnuts, pine nuts, sesame seeds, sunflower seeds, and flaxseed. Beverages Aim for 6 eight-ounce glasses of water per day. Limit coffee to 3-5 eight-ounce cups per day. Limit caffeinated beverages that have added calories, such as soda and energy drinks. If you drink alcohol: Limit how much you have to: 0-1 drink a day if you are female. 0-2 drinks a day if you are female. Know how much alcohol is in your drink.  In the [place] one drink is one 12 oz bottle of beer (355 mL), one 5 oz glass of wine (148 mL), or one 1 oz glass of hard liquor (44 mL). Seasoning and other foods Try not to add too much salt to your food. Try using herbs and spices instead of salt. Try not to add sugar to food. This information is based on [place] nutrition guidelines. To learn more, visit disposablenylon.be. Exact amounts may vary. You may need different amounts. This information is not intended to replace advice given to you by your health care provider. Make sure you discuss any questions you have with your health care provider. Document Revised: 05/28/2024 Document Reviewed: 04/19/2022 Elsevier Patient Education  2025 [employer].

## 2024-09-06 NOTE — Assessment & Plan Note (Addendum)
 Chronic, has been using CPAP and noticing difference in energy level.  AHI 27.9/hour. Recommend 100% use of CPAP. May try a daily Zyrtec or Claritin to help with dry cough related to drainage. Will attempt to get Zepbound  coverage which is FDA approved for OSA and obesity.

## 2024-09-06 NOTE — Progress Notes (Signed)
 "  BP 117/82   Pulse 87   Temp (!) 97.4 F (36.3 C) (Oral)   Ht 5' 9 (1.753 m)   Wt (!) 311 lb 9.6 oz (141.3 kg)   SpO2 93%   BMI 46.02 kg/m    Subjective:    Patient ID: Erin Contreras, female    DOB: 1960/11/29, 64 y.o.   MRN: [id number]  HPI: Erin Contreras is a 64 y.o. female  Chief Complaint  Patient presents with   Urinary Tract Infection    Patient states she has been having vaginal discharge, strong urine odor, and urinary frequency for the last month. States she experiences these symptoms daily. Denies having any pain, burning or pressure.    URINARY SYMPTOMS Started about one month ago. Had some odor, discharge, and frequency. Is using vaginal estrace as ordered by GYN. Recently restarted. Dysuria: no Urinary frequency: yes Urgency: yes Small volume voids: no Symptom severity: yes Urinary incontinence: just a little Foul odor: yes Hematuria: no Abdominal pain: no Back pain: at baseline Suprapubic pain/pressure: a little bit Flank pain: no Fever:  no Vomiting: no Status: stable Previous urinary tract infection: yes Recurrent urinary tract infection: no Sexual activity: No sexually active/monogomous/practicing safe sex History of sexually transmitted disease: no Treatments attempted: increasing fluids    SLEEP APNEA Has AHI 27.9/hour overall. Her specialists have recommended she go on weight loss medication. Using CPAP daily. Sleep study performed 04/23/24. Sleep apnea status: stable Duration: chronic Satisfied with current treatment?:  yes CPAP use:  yes Sleep quality with CPAP use: good, average Treament compliance:good compliance Last sleep study: 04/23/24 Treatments attempted: CPAP Wakes feeling refreshed:  yes Daytime hypersomnolence:  no Fatigue:  not as bad Insomnia:  no Good sleep hygiene:  no Difficulty falling asleep:  no Difficulty staying asleep:  yes Snoring bothers bed partner:  no Observed apnea by bed partner: no Obesity:   yes Hypertension: yes  Pulmonary hypertension:  no Coronary artery disease:  no Clinical coverage for weight loss GLP's   Medication being dispensed is Zepbound  2 mL/28 days. Titration doses are 2 mL/28 days.   [x]  Product being prescribed is FDA approved for the indication, age, weight (if applicable) and not does not exceed dosing limits per the Prescribing Information per the clinical conditions for use.  [x]  Patient's baseline weight measured within the last 45 days as required by provider before dispensing.  [x]  Patient has participated in 6 months or greater of structured nutrition therapy and structured physical therapy as directed by their physician, unless physical therapy is contraindicated based on patient's medical history and will continue structured nutrition therapy and structured physical therapy while using any pharmacological interventions including GLP-1's.   [x]  Patient is new to therapy and One of the following:   [x]  The beneficiary is 65 years of age or over and has ONE of the following:  [x]  A BMI greater than or equal to 30 kg/m2  []  A BMI greater than or equal to 27 kg/m2 with at least one weight-related comorbidity/risk factor/complication (i.e. hypertension, type 2 diabetes, obstructive sleep apnea, cardiovascular disease, dyslipidemia)  If patient has one weight-related comorbidity/risk factor/complication (i.e. hypertension, type 2 diabetes, obstructive sleep apnea, cardiovascular disease, dyslipidemia), please list, Patient suffers from weight-related comorbidity/risk factor/complication obstructive sleep apnea diagnosed 04/23/24.  [x]  The beneficiary has participated in 6 months or greater of lifestyle modification and will continue while on medication including structured nutrition following provider recommend dietary modifications and physical activity, unless physical activity  is not clinically appropriate at the time GLP1 therapy commences AND  [x]  The  beneficiary will NOT be using the requested agent in combination with another GLP-1 receptor agonist agent AND  [x]  The beneficiary does NOT have any FDA-labeled contraindications to the requested agent, including pregnancy, lactation, history of medullary thyroid  cancer or multiple endocrine neoplasia type II.  If patient has one weight-related comorbidity/risk factor/complication (i.e. hypertension, type 2 diabetes, obstructive sleep apnea, cardiovascular disease, dyslipidemia), please list, Patient suffers from weight-related comorbidity/risk factor/complication obstructive sleep apnea.  Last BMI/Weight/Height recorded BMI Readings from Last 1 Encounters:  09/06/24 46.02 kg/m   Relevant past medical, surgical, family and social history reviewed and updated as indicated. Interim medical history since our last visit reviewed. Allergies and medications reviewed and updated.  Review of Systems  Constitutional:  Negative for activity change, appetite change, diaphoresis, fatigue and unexpected weight change.  Respiratory: Negative.    Cardiovascular: Negative.   Gastrointestinal: Negative.   Genitourinary:  Positive for frequency, urgency and vaginal discharge. Negative for decreased urine volume, dysuria, hematuria and pelvic pain.  Neurological: Negative.   Psychiatric/Behavioral: Negative.      Per HPI unless specifically indicated above     Objective:    BP 117/82   Pulse 87   Temp (!) 97.4 F (36.3 C) (Oral)   Ht 5' 9 (1.753 m)   Wt (!) 311 lb 9.6 oz (141.3 kg)   SpO2 93%   BMI 46.02 kg/m   Wt Readings from Last 3 Encounters:  09/06/24 (!) 311 lb 9.6 oz (141.3 kg)  09/05/24 (!) 311 lb 12.8 oz (141.4 kg)  06/18/24 (!) 306 lb 14.4 oz (139.2 kg)    Physical Exam Vitals and nursing note reviewed.  Constitutional:      General: She is awake. She is not in acute distress.    Appearance: She is well-developed and well-groomed. She is obese. She is not ill-appearing.   HENT:     Head: Normocephalic.     Right Ear: Hearing normal.     Left Ear: Hearing normal.  Eyes:     General: Lids are normal.        Right eye: No discharge.        Left eye: No discharge.     Conjunctiva/sclera: Conjunctivae normal.     Pupils: Pupils are equal, round, and reactive to light.  Neck:     Vascular: No carotid bruit.  Cardiovascular:     Rate and Rhythm: Normal rate and regular rhythm.     Heart sounds: Normal heart sounds. No murmur heard.    No gallop.  Pulmonary:     Effort: Pulmonary effort is normal. No accessory muscle usage or respiratory distress.     Breath sounds: Normal breath sounds.  Abdominal:     General: Bowel sounds are normal. There is no distension.     Palpations: Abdomen is soft.     Tenderness: There is no abdominal tenderness. There is no right CVA tenderness or left CVA tenderness.  Musculoskeletal:     Cervical back: Normal range of motion and neck supple.     Right lower leg: No edema.     Left lower leg: No edema.  Skin:    General: Skin is warm and dry.  Neurological:     Mental Status: She is alert and oriented to person, place, and time.  Psychiatric:        Attention and Perception: Attention normal.  Mood and Affect: Mood normal.        Speech: Speech normal.        Behavior: Behavior normal. Behavior is cooperative.        Thought Content: Thought content normal.    Results for orders placed or performed in visit on 09/06/24  WET PREP FOR TRICH, YEAST, CLUE   Collection Time: 09/06/24  2:12 PM   Specimen: Urine   Urine  Result Value Ref Range   Trichomonas Exam Negative Negative   Yeast Exam Negative Negative   Clue Cell Exam Negative Negative  Microscopic Examination   Collection Time: 09/06/24  2:12 PM   Urine  Result Value Ref Range   WBC, UA None seen 0 - 5 /hpf   RBC, Urine None seen 0 - 2 /hpf   Epithelial Cells (non renal) >10 (A) 0 - 10 /hpf   Bacteria, UA None seen None seen/Few  Urinalysis,  Routine w reflex microscopic   Collection Time: 09/06/24  2:12 PM  Result Value Ref Range   Specific Gravity, UA >1.030 (H) 1.005 - 1.030   pH, UA 5.5 5.0 - 7.5   Color, UA Yellow Yellow   Appearance Ur Clear Clear   Leukocytes,UA Negative Negative   Protein,UA Negative Negative/Trace   Glucose, UA Negative Negative   Ketones, UA 1+ (A) Negative   RBC, UA Negative Negative   Bilirubin, UA 1+ (A) Negative   Urobilinogen, Ur 1.0 0.2 - 1.0 mg/dL   Nitrite, UA Negative Negative   Microscopic Examination See below:       Assessment & Plan:   Problem List Items Addressed This Visit       Respiratory   Obstructive sleep apnea of adult (Chronic)   Chronic, has been using CPAP and noticing difference in energy level.  AHI 27.9/hour. Recommend 100% use of CPAP. May try a daily Zyrtec or Claritin to help with dry cough related to drainage. Will attempt to get Zepbound  coverage which is FDA approved for OSA and obesity.      Relevant Medications   tirzepatide  (ZEPBOUND ) 2.5 MG/0.5ML Pen     Other   Vaginal discharge   Acute.  Wet prep all negative and UA overall reassuring, will send for culture as well.  Recommend she continue vaginal estrace and trial a daily probiotic.      Relevant Orders   Urinalysis, Routine w reflex microscopic (Completed)   WET PREP FOR TRICH, YEAST, CLUE (Completed)   Obesity - Primary   BMI 46.02 with underlying OSA, prediabetes, HTN/HLD, and RA.  Would benefit modest weight loss which we discussed today.  No family history of thyroid  cancer (MTC, MEN 2, thyroid  cell tumors) or pancreatitis.   We will work on getting Zepbound  coverage as it is FDA approved for OSA. She has tried for 6+ months to lose weight on her own with no success via diet changes and exercise.  Recommended eating smaller high protein, low fat meals more frequently and exercising 30 mins a day 5 times a week with a goal of 10-15lb weight loss in the next 3 months. Patient voiced their  understanding and motivation to adhere to these recommendations.       Relevant Medications   tirzepatide  (ZEPBOUND ) 2.5 MG/0.5ML Pen   Other Visit Diagnoses       Pyuria       Urine sent for culture and treat as needed.   Relevant Orders   Urine Culture  Follow up plan: Return in about 6 weeks (around 10/18/2024) for OSA.      "

## 2024-09-06 NOTE — Assessment & Plan Note (Signed)
 BMI 46.02 with underlying OSA, prediabetes, HTN/HLD, and RA.  Would benefit modest weight loss which we discussed today.  No family history of thyroid  cancer (MTC, MEN 2, thyroid  cell tumors) or pancreatitis.   We will work on getting Zepbound  coverage as it is FDA approved for OSA. She has tried for 6+ months to lose weight on her own with no success via diet changes and exercise.  Recommended eating smaller high protein, low fat meals more frequently and exercising 30 mins a day 5 times a week with a goal of 10-15lb weight loss in the next 3 months. Patient voiced their understanding and motivation to adhere to these recommendations.

## 2024-09-06 NOTE — Assessment & Plan Note (Signed)
 Acute.  Wet prep all negative and UA overall reassuring, will send for culture as well.  Recommend she continue vaginal estrace and trial a daily probiotic.

## 2024-09-12 ENCOUNTER — Ambulatory Visit: Admitting: Nurse Practitioner

## 2024-10-16 ENCOUNTER — Encounter: Admitting: Nurse Practitioner

## 2024-11-07 ENCOUNTER — Ambulatory Visit: Admitting: Nurse Practitioner

## 2025-03-07 ENCOUNTER — Ambulatory Visit: Admitting: Neurology
